# Patient Record
Sex: Male | Born: 1955
Health system: Southern US, Community
[De-identification: ages and names within clinical notes are randomized; demographics above are authoritative.]

## PROBLEM LIST (undated history)

## (undated) DIAGNOSIS — M1990 Unspecified osteoarthritis, unspecified site: Secondary | ICD-10-CM

## (undated) DIAGNOSIS — M549 Dorsalgia, unspecified: Secondary | ICD-10-CM

## (undated) DIAGNOSIS — I10 Essential (primary) hypertension: Secondary | ICD-10-CM

## (undated) DIAGNOSIS — R000 Tachycardia, unspecified: Secondary | ICD-10-CM

## (undated) DIAGNOSIS — R7989 Other specified abnormal findings of blood chemistry: Secondary | ICD-10-CM

## (undated) DIAGNOSIS — IMO0002 Reserved for concepts with insufficient information to code with codable children: Secondary | ICD-10-CM

## (undated) DIAGNOSIS — G5600 Carpal tunnel syndrome, unspecified upper limb: Secondary | ICD-10-CM

## (undated) DIAGNOSIS — I2510 Atherosclerotic heart disease of native coronary artery without angina pectoris: Secondary | ICD-10-CM

## (undated) DIAGNOSIS — J302 Other seasonal allergic rhinitis: Secondary | ICD-10-CM

## (undated) DIAGNOSIS — I219 Acute myocardial infarction, unspecified: Secondary | ICD-10-CM

## (undated) DIAGNOSIS — E11319 Type 2 diabetes mellitus with unspecified diabetic retinopathy without macular edema: Secondary | ICD-10-CM

## (undated) DIAGNOSIS — E785 Hyperlipidemia, unspecified: Secondary | ICD-10-CM

## (undated) DIAGNOSIS — E663 Overweight: Secondary | ICD-10-CM

## (undated) HISTORY — DX: Other specified abnormal findings of blood chemistry: R79.89

## (undated) HISTORY — PX: CORONARY ANGIOPLASTY: SHX604

## (undated) HISTORY — DX: Overweight: E66.3

## (undated) HISTORY — PX: CARDIAC CATHETERIZATION: SHX172

## (undated) HISTORY — DX: Type 2 diabetes mellitus with unspecified diabetic retinopathy without macular edema: E11.319

## (undated) HISTORY — DX: Hyperlipidemia, unspecified: E78.5

## (undated) HISTORY — PX: LUMBAR EPIDURAL INJECTION: SHX1980

## (undated) HISTORY — DX: Tachycardia, unspecified: R00.0

---

## 1999-06-03 ENCOUNTER — Emergency Department (HOSPITAL_COMMUNITY): Admission: EM | Admit: 1999-06-03 | Discharge: 1999-06-03 | Payer: Self-pay | Admitting: *Deleted

## 2003-09-22 ENCOUNTER — Emergency Department (HOSPITAL_COMMUNITY): Admission: EM | Admit: 2003-09-22 | Discharge: 2003-09-23 | Payer: Self-pay | Admitting: Emergency Medicine

## 2007-12-24 ENCOUNTER — Encounter: Admission: RE | Admit: 2007-12-24 | Discharge: 2007-12-24 | Payer: Self-pay | Admitting: Internal Medicine

## 2010-06-26 ENCOUNTER — Other Ambulatory Visit: Payer: Self-pay | Admitting: Neurosurgery

## 2010-06-26 DIAGNOSIS — M545 Low back pain, unspecified: Secondary | ICD-10-CM

## 2010-07-07 ENCOUNTER — Ambulatory Visit
Admission: RE | Admit: 2010-07-07 | Discharge: 2010-07-07 | Disposition: A | Discharge status: Home / Self Care | Payer: 59 | Source: Ambulatory Visit | Attending: Neurosurgery | Admitting: Neurosurgery

## 2010-07-07 DIAGNOSIS — M545 Low back pain, unspecified: Secondary | ICD-10-CM

## 2011-06-26 ENCOUNTER — Other Ambulatory Visit: Payer: Self-pay | Admitting: Orthopedic Surgery

## 2011-06-30 ENCOUNTER — Encounter (HOSPITAL_BASED_OUTPATIENT_CLINIC_OR_DEPARTMENT_OTHER): Payer: Self-pay | Admitting: *Deleted

## 2011-06-30 ENCOUNTER — Encounter (HOSPITAL_BASED_OUTPATIENT_CLINIC_OR_DEPARTMENT_OTHER)
Admission: RE | Admit: 2011-06-30 | Discharge: 2011-06-30 | Disposition: A | Discharge status: Home / Self Care | Payer: 59 | Source: Ambulatory Visit | Attending: Orthopedic Surgery | Admitting: Orthopedic Surgery

## 2011-06-30 LAB — BASIC METABOLIC PANEL
BUN: 13 mg/dL (ref 6–23)
CO2: 26 mEq/L (ref 19–32)
Calcium: 9.7 mg/dL (ref 8.4–10.5)
Chloride: 102 mEq/L (ref 96–112)
Creatinine, Ser: 1.12 mg/dL (ref 0.50–1.35)
GFR calc Af Amer: 84 mL/min — ABNORMAL LOW (ref >90)
GFR calc non Af Amer: 73 mL/min — ABNORMAL LOW (ref >90)
Glucose, Bld: 142 mg/dL — ABNORMAL HIGH (ref 70–99)
Potassium: 4 mEq/L (ref 3.5–5.1)
Sodium: 139 mEq/L (ref 135–145)

## 2011-06-30 NOTE — Progress Notes (Signed)
To bring in med list and get ekg and bmet Denies snoring or OSA Never seen a cardiologist Never had surgery

## 2011-07-01 NOTE — H&P (Signed)
Keith Newton is an 56 y.o. male.   Chief Complaint: c/o chronic and progressive right hand numbness and tingling HPI: . Keith Newton is a pleasant 56 year old gentleman who works with the [employer]. He is involved in turf care. For the past 10 years he has had episodic bilateral hand numbness. He has had extreme numbness in his hands at night 7 out of 7 nights a week during the past several months. He has weakness of grip and swelling over the volar aspect of his wrist. He has had no sign of trigger fingers. He has had type II diabetes for 4 years. He was recently seen for evaluation and was noted to have probable significant carpal tunnel syndrome.   Past Medical History  Diagnosis Date  . Diabetes mellitus   . Arthritis   . Carpal tunnel syndrome   . Hypertension     states very mild  . Seasonal allergies   . DDD (degenerative disc disease)   . Back pain     Past Surgical History  Procedure Date  . Lumbar epidural injection     x2    No family history on file. Social History:  reports that he has never smoked. He does not have any smokeless tobacco history on file. He reports that he does not drink alcohol or use illicit drugs.  Allergies:  Allergies  Allergen Reactions  . Codeine Swelling    No prescriptions prior to admission    Results for orders placed during the hospital encounter of 07/02/11 (from the past 48 hour(s))  BASIC METABOLIC PANEL     Status: Abnormal   Collection Time   06/30/11  3:00 PM      Component Value Range Comment   Sodium 139  135 - 145 (mEq/L)    Potassium 4.0  3.5 - 5.1 (mEq/L)    Chloride 102  96 - 112 (mEq/L)    CO2 26  19 - 32 (mEq/L)    Glucose, Bld 142 (*) 70 - 99 (mg/dL)    BUN 13  6 - 23 (mg/dL)    Creatinine, Ser 1.61  0.50 - 1.35 (mg/dL)    Calcium 9.7  8.4 - 10.5 (mg/dL)    GFR calc non Af Amer 73 (*) >90 (mL/min)    GFR calc Af Amer 84 (*) >90 (mL/min)     No results found.   Pertinent items are noted  in HPI.  Height 5\' 8"  (1.727 m), weight 130.182 kg (287 lb).  General appearance: alert Head: Normocephalic, without obvious abnormality Neck: supple, symmetrical, trachea midline Resp: clear to auscultation bilaterally Cardio: regular rate and rhythm GI: normal findings: bowel sounds normal Extremities: . Inspection of his hands reveals loss of his normal median sweat patterns. He has a positive wrist flexion test and a positive Tinel's sign bilaterally. His pulses and capillary refill are intact. He has no sign of stenosing tenosynovitis.    Electrodiagnostic studies: These reveal evidence of moderately severe bilateral carpal tunnel syndrome. His sensory amplitudes are markedly decreased 9 millivolts bilaterally. His ulnar conduction parameters were normal.  Pulses: 2+ and symmetric Skin: normal Neurologic: Grossly normal    Assessment/Plan  Impression: Severe Bilateral CTS  Plan: To the OR for Right CTR. The procedure, risks and post-op course were discussed with the patient at length and he was in agreement with the plan.   Carole Binning 07/01/2011, 4:36 PM      H&P documentation: 07/02/2011  -History and  Physical Reviewed  -Patient has been re-examined  -No change in the plan of care  Wyn Forster, MD

## 2011-07-02 ENCOUNTER — Encounter (HOSPITAL_BASED_OUTPATIENT_CLINIC_OR_DEPARTMENT_OTHER): Payer: Self-pay | Admitting: Anesthesiology

## 2011-07-02 ENCOUNTER — Ambulatory Visit (HOSPITAL_BASED_OUTPATIENT_CLINIC_OR_DEPARTMENT_OTHER)
Admission: RE | Admit: 2011-07-02 | Discharge: 2011-07-02 | Disposition: A | Discharge status: Home / Self Care | Payer: 59 | Source: Ambulatory Visit | Attending: Orthopedic Surgery | Admitting: Orthopedic Surgery

## 2011-07-02 ENCOUNTER — Encounter (HOSPITAL_BASED_OUTPATIENT_CLINIC_OR_DEPARTMENT_OTHER)
Admission: RE | Disposition: A | Discharge status: Home / Self Care | Payer: Self-pay | Source: Ambulatory Visit | Attending: Orthopedic Surgery

## 2011-07-02 ENCOUNTER — Encounter (HOSPITAL_BASED_OUTPATIENT_CLINIC_OR_DEPARTMENT_OTHER): Payer: Self-pay | Admitting: *Deleted

## 2011-07-02 ENCOUNTER — Ambulatory Visit (HOSPITAL_BASED_OUTPATIENT_CLINIC_OR_DEPARTMENT_OTHER): Payer: 59 | Admitting: Anesthesiology

## 2011-07-02 DIAGNOSIS — I10 Essential (primary) hypertension: Secondary | ICD-10-CM | POA: Insufficient documentation

## 2011-07-02 DIAGNOSIS — G5600 Carpal tunnel syndrome, unspecified upper limb: Secondary | ICD-10-CM | POA: Insufficient documentation

## 2011-07-02 DIAGNOSIS — Z01810 Encounter for preprocedural cardiovascular examination: Secondary | ICD-10-CM | POA: Insufficient documentation

## 2011-07-02 DIAGNOSIS — Z01812 Encounter for preprocedural laboratory examination: Secondary | ICD-10-CM | POA: Insufficient documentation

## 2011-07-02 DIAGNOSIS — E119 Type 2 diabetes mellitus without complications: Secondary | ICD-10-CM | POA: Insufficient documentation

## 2011-07-02 HISTORY — DX: Essential (primary) hypertension: I10

## 2011-07-02 HISTORY — DX: Reserved for concepts with insufficient information to code with codable children: IMO0002

## 2011-07-02 HISTORY — PX: CARPAL TUNNEL RELEASE: SHX101

## 2011-07-02 HISTORY — DX: Dorsalgia, unspecified: M54.9

## 2011-07-02 HISTORY — DX: Unspecified osteoarthritis, unspecified site: M19.90

## 2011-07-02 HISTORY — DX: Carpal tunnel syndrome, unspecified upper limb: G56.00

## 2011-07-02 HISTORY — DX: Other seasonal allergic rhinitis: J30.2

## 2011-07-02 LAB — GLUCOSE, CAPILLARY
Glucose-Capillary: 125 mg/dL — ABNORMAL HIGH (ref 70–99)
Glucose-Capillary: 135 mg/dL — ABNORMAL HIGH (ref 70–99)

## 2011-07-02 LAB — POCT HEMOGLOBIN-HEMACUE: Hemoglobin: 14.7 g/dL (ref 13.0–17.0)

## 2011-07-02 SURGERY — CARPAL TUNNEL RELEASE
Anesthesia: General | Site: Hand | Laterality: Right | Wound class: Clean

## 2011-07-02 MED ORDER — HYDROMORPHONE HCL 2 MG PO TABS
2.0000 mg | ORAL_TABLET | Freq: Once | ORAL | Status: AC | PRN
  Administered 2011-07-02: 2 mg via ORAL

## 2011-07-02 MED ORDER — METOCLOPRAMIDE HCL 5 MG/ML IJ SOLN
INTRAMUSCULAR | Status: DC | PRN
  Administered 2011-07-02: 10 mg via INTRAVENOUS

## 2011-07-02 MED ORDER — PROPOFOL 10 MG/ML IV EMUL
INTRAVENOUS | Status: DC | PRN
  Administered 2011-07-02: 300 mg via INTRAVENOUS
  Administered 2011-07-02: 100 mg via INTRAVENOUS

## 2011-07-02 MED ORDER — LIDOCAINE HCL 2 % IJ SOLN
INTRAMUSCULAR | Status: DC | PRN
  Administered 2011-07-02: 4 mL

## 2011-07-02 MED ORDER — ONDANSETRON HCL 4 MG/2ML IJ SOLN
INTRAMUSCULAR | Status: DC | PRN
  Administered 2011-07-02: 4 mg via INTRAVENOUS

## 2011-07-02 MED ORDER — LACTATED RINGERS IV SOLN
INTRAVENOUS | Status: DC
  Administered 2011-07-02: 08:00:00 via INTRAVENOUS

## 2011-07-02 MED ORDER — LIDOCAINE HCL (CARDIAC) 20 MG/ML IV SOLN
INTRAVENOUS | Status: DC | PRN
  Administered 2011-07-02: 50 mg via INTRAVENOUS

## 2011-07-02 MED ORDER — MIDAZOLAM HCL 5 MG/5ML IJ SOLN
INTRAMUSCULAR | Status: DC | PRN
  Administered 2011-07-02: 2 mg via INTRAVENOUS

## 2011-07-02 MED ORDER — METOCLOPRAMIDE HCL 5 MG/ML IJ SOLN: 10.0000 mg | Freq: Once | INTRAMUSCULAR | Status: DC | PRN

## 2011-07-02 MED ORDER — DROPERIDOL 2.5 MG/ML IJ SOLN
INTRAMUSCULAR | Status: DC | PRN
  Administered 2011-07-02: 0.625 mg via INTRAVENOUS

## 2011-07-02 MED ORDER — HYDROMORPHONE HCL 2 MG PO TABS: ORAL_TABLET | ORAL | Status: AC

## 2011-07-02 MED ORDER — FENTANYL CITRATE 0.05 MG/ML IJ SOLN
25.0000 ug | INTRAMUSCULAR | Status: DC | PRN
  Administered 2011-07-02: 25 ug via INTRAVENOUS
  Administered 2011-07-02: 50 ug via INTRAVENOUS

## 2011-07-02 MED ORDER — CHLORHEXIDINE GLUCONATE 4 % EX LIQD: 60.0000 mL | Freq: Once | CUTANEOUS | Status: DC

## 2011-07-02 MED ORDER — FENTANYL CITRATE 0.05 MG/ML IJ SOLN
INTRAMUSCULAR | Status: DC | PRN
  Administered 2011-07-02 (×2): 50 ug via INTRAVENOUS

## 2011-07-02 SURGICAL SUPPLY — 40 items
BANDAGE ADHESIVE 1X3 (GAUZE/BANDAGES/DRESSINGS) IMPLANT
BANDAGE ELASTIC 3 VELCRO ST LF (GAUZE/BANDAGES/DRESSINGS) ×2 IMPLANT
BLADE SURG 15 STRL LF DISP TIS (BLADE) ×1 IMPLANT
BLADE SURG 15 STRL SS (BLADE) ×1
BNDG ESMARK 4X9 LF (GAUZE/BANDAGES/DRESSINGS) ×2 IMPLANT
BRUSH SCRUB EZ PLAIN DRY (MISCELLANEOUS) ×2 IMPLANT
CLOTH BEACON ORANGE TIMEOUT ST (SAFETY) ×2 IMPLANT
CORDS BIPOLAR (ELECTRODE) ×2 IMPLANT
COVER MAYO STAND STRL (DRAPES) ×2 IMPLANT
COVER TABLE BACK 60X90 (DRAPES) ×2 IMPLANT
CUFF TOURNIQUET SINGLE 18IN (TOURNIQUET CUFF) IMPLANT
CUFF TOURNIQUET SINGLE 24IN (TOURNIQUET CUFF) ×2 IMPLANT
DECANTER SPIKE VIAL GLASS SM (MISCELLANEOUS) IMPLANT
DRAPE EXTREMITY T 121X128X90 (DRAPE) ×2 IMPLANT
DRAPE SURG 17X23 STRL (DRAPES) ×2 IMPLANT
GLOVE BIO SURGEON STRL SZ 6.5 (GLOVE) ×2 IMPLANT
GLOVE BIOGEL M STRL SZ7.5 (GLOVE) ×2 IMPLANT
GLOVE BIOGEL PI IND STRL 7.0 (GLOVE) ×1 IMPLANT
GLOVE BIOGEL PI INDICATOR 7.0 (GLOVE) ×1
GLOVE EXAM NITRILE MD LF STRL (GLOVE) ×2 IMPLANT
GLOVE ORTHO TXT STRL SZ7.5 (GLOVE) ×2 IMPLANT
GOWN PREVENTION PLUS XLARGE (GOWN DISPOSABLE) ×2 IMPLANT
GOWN PREVENTION PLUS XXLARGE (GOWN DISPOSABLE) ×4 IMPLANT
NEEDLE 27GAX1X1/2 (NEEDLE) ×2 IMPLANT
PACK BASIN DAY SURGERY FS (CUSTOM PROCEDURE TRAY) ×2 IMPLANT
PAD CAST 3X4 CTTN HI CHSV (CAST SUPPLIES) ×1 IMPLANT
PADDING CAST ABS 4INX4YD NS (CAST SUPPLIES) ×1
PADDING CAST ABS COTTON 4X4 ST (CAST SUPPLIES) ×1 IMPLANT
PADDING CAST COTTON 3X4 STRL (CAST SUPPLIES) ×1
SPLINT PLASTER CAST XFAST 3X15 (CAST SUPPLIES) ×5 IMPLANT
SPLINT PLASTER XTRA FASTSET 3X (CAST SUPPLIES) ×5
SPONGE GAUZE 4X4 12PLY (GAUZE/BANDAGES/DRESSINGS) ×2 IMPLANT
STOCKINETTE 4X48 STRL (DRAPES) ×2 IMPLANT
STRIP CLOSURE SKIN 1/2X4 (GAUZE/BANDAGES/DRESSINGS) ×2 IMPLANT
SUT PROLENE 3 0 PS 2 (SUTURE) ×2 IMPLANT
SYR 3ML 23GX1 SAFETY (SYRINGE) IMPLANT
SYR CONTROL 10ML LL (SYRINGE) ×2 IMPLANT
TRAY DSU PREP LF (CUSTOM PROCEDURE TRAY) ×2 IMPLANT
UNDERPAD 30X30 INCONTINENT (UNDERPADS AND DIAPERS) ×2 IMPLANT
WATER STERILE IRR 1000ML POUR (IV SOLUTION) ×2 IMPLANT

## 2011-07-02 NOTE — Brief Op Note (Signed)
07/02/2011  9:06 AM  PATIENT:  [name]  56 y.o. male  PRE-OPERATIVE DIAGNOSIS:  Bilateral carpal tunnel syndrome  POST-OPERATIVE DIAGNOSIS:  Bilateral carpal tunnel syndrome  PROCEDURE:  CARPAL TUNNEL RELEASE (Right)  SURGEON: Wyn Forster., MD   PHYSICIAN ASSISTANT:   ASSISTANTS: Mallory Shirk.A-C   ANESTHESIA:   general  EBL:  Total I/O In: 300 [I.V.:300] Out: -   BLOOD ADMINISTERED:none  DRAINS: none   LOCAL MEDICATIONS USED:  LIDOCAINE   SPECIMEN:  No Specimen  DISPOSITION OF SPECIMEN:  N/A  COUNTS:  YES  TOURNIQUET:   Total Tourniquet Time Documented: Upper Arm (Right) - 15 minutes  DICTATION: .Other Dictation: Dictation Number [id number]  PLAN OF CARE: Discharge to home after PACU  PATIENT DISPOSITION:  Awake, alert and stable

## 2011-07-02 NOTE — Op Note (Signed)
Op note dictated [id number]

## 2011-07-02 NOTE — Anesthesia Postprocedure Evaluation (Signed)
Anesthesia Post Note  Patient: Keith Newton  Procedure(s) Performed: Procedure(s) (LRB): CARPAL TUNNEL RELEASE (Right)  Anesthesia type: General  Patient location: PACU  Post pain: Pain level controlled  Post assessment: Patient's Cardiovascular Status Stable  Last Vitals:  Filed Vitals:   07/02/11 1037  BP: 123/81  Pulse: 88  Temp:   Resp: 13    Post vital signs: Reviewed and stable  Level of consciousness: alert  Complications: No apparent anesthesia complications

## 2011-07-02 NOTE — Discharge Instructions (Signed)

## 2011-07-02 NOTE — Anesthesia Procedure Notes (Addendum)
Performed by: Caren Macadam   Procedure Name: LMA Insertion Date/Time: 07/02/2011 8:38 AM Performed by: Caren Macadam Pre-anesthesia Checklist: Patient identified, Emergency Drugs available, Suction available and Patient being monitored Patient Re-evaluated:Patient Re-evaluated prior to inductionOxygen Delivery Method: Circle System Utilized Preoxygenation: Pre-oxygenation with 100% oxygen Intubation Type: IV induction Ventilation: Mask ventilation without difficulty LMA: LMA with gastric port inserted LMA Size: 4.0 Number of attempts: 1 Airway Equipment and Method: bite block Placement Confirmation: positive ETCO2 and breath sounds checked- equal and bilateral Tube secured with: Tape Dental Injury: Teeth and Oropharynx as per pre-operative assessment

## 2011-07-02 NOTE — Anesthesia Preprocedure Evaluation (Signed)
Anesthesia Evaluation  Patient identified by MRN, date of birth, ID band Patient awake    Reviewed: Allergy & Precautions, H&P , NPO status , Patient's Chart, lab work & pertinent test results, reviewed documented beta blocker date and time   Airway Mallampati: II TM Distance: >3 FB Neck ROM: full    Dental   Pulmonary neg pulmonary ROS,          Cardiovascular hypertension, On Medications     Neuro/Psych  Neuromuscular disease negative psych ROS   GI/Hepatic negative GI ROS, Neg liver ROS,   Endo/Other  Diabetes mellitus-, Type obesity  Renal/GU negative Renal ROS  negative genitourinary   Musculoskeletal   Abdominal   Peds  Hematology negative hematology ROS (+)   Anesthesia Other Findings See surgeon's H&P   Reproductive/Obstetrics negative OB ROS                           Anesthesia Physical Anesthesia Plan  ASA: II  Anesthesia Plan: General   Post-op Pain Management:    Induction: Intravenous  Airway Management Planned: LMA  Additional Equipment:   Intra-op Plan:   Post-operative Plan: Extubation in OR  Informed Consent: I have reviewed the patients History and Physical, chart, labs and discussed the procedure including the risks, benefits and alternatives for the proposed anesthesia with the patient or authorized representative who has indicated his/her understanding and acceptance.   Dental Advisory Given  Plan Discussed with: CRNA and Surgeon  Anesthesia Plan Comments:         Anesthesia Quick Evaluation

## 2011-07-02 NOTE — Transfer of Care (Signed)
Immediate Anesthesia Transfer of Care Note  Patient: Keith Newton  Procedure(s) Performed: Procedure(s) (LRB): CARPAL TUNNEL RELEASE (Right)  Patient Location: PACU  Anesthesia Type: General  Level of Consciousness: awake and alert   Airway & Oxygen Therapy: Patient Spontanous Breathing and Patient connected to face mask oxygen  Post-op Assessment: Report given to PACU RN and Post -op Vital signs reviewed and stable  Post vital signs: Reviewed and stable  Complications: No apparent anesthesia complications

## 2011-07-03 ENCOUNTER — Encounter (HOSPITAL_BASED_OUTPATIENT_CLINIC_OR_DEPARTMENT_OTHER): Payer: Self-pay | Admitting: Orthopedic Surgery

## 2011-07-03 NOTE — Op Note (Addendum)
Keith Newton, Keith Newton NO.:  [id number]  MEDICAL RECORD NO.:  [id number]  LOCATION:                                 FACILITY:  PHYSICIAN:  Katy Fitch. Marcial Pless, M.D.      DATE OF BIRTH:  DATE OF PROCEDURE:  07/02/2011 DATE OF DISCHARGE:                              OPERATIVE REPORT   PREOPERATIVE DIAGNOSIS:  Entrapment neuropathy median nerve, right carpal tunnel (severe).  POSTOPERATIVE DIAGNOSIS:  Entrapment neuropathy median nerve, right carpal tunnel (severe).  OPERATIONS:  Release of right transverse carpal ligament.  OPERATING SURGEON:  Katy Fitch. Deamber Buckhalter, M.D.  ASSISTANT:  Jonni Sanger, P.A.  ANESTHESIA:  General by LMA.  SUPERVISING ANESTHESIOLOGIST:  Janetta Hora. Gelene Mink, M.D.  INDICATIONS:  Keith Newton is a 56 year old gentleman with type 2 diabetes, referred through the courtesy of Dr. Margaretmary Bayley for management of bilateral hand numbness. Keith Newton is a very large man weighing 130 kg.  He has well controlled type 2 diabetes, under the careful supervision of Dr. Margaretmary Bayley.  He was referred due to bilateral hand numbness and chronic discomfort. Clinical examination revealed signs of probable carpal tunnel syndrome. Electrodiagnostic studies confirmed severe bilateral carpal tunnel syndrome.  After informed consent, during which we advised Keith Newton that he would require surgery to decompress the median nerves.  He also was reminded that it would take up to 5 months to see maximum benefit of surgery due to a need for remyelination and axonal regrowth.  Preoperatively questions invited and answered in detail on several occasions.  PROCEDURE:  Keith Newton was brought to room 1 of the [facility] and placed in supine position up on the operating table.  Following induction of general anesthesia by LMA technique, the right arm and hand were prepped with Betadine soap and solution, sterilely draped.  A pneumatic tourniquet  was applied over proximal right brachium.  Following exsanguination right arm with Esmarch bandage, arterial tourniquet was inflated to 220 mmHg.  Following routine surgical time-out, the procedure commenced with a 2.5 cm incision in the line of the ring finger in the palm.  Subcutaneous tissues were carefully divided taking care to identify the superficial palmar arch, and the distal margin of transcarpal ligament.  The carpal canal was sounded with [facility].  Abundant adipose tissue was noted within the mid palmar space and carpal canal obscuring vision. This was meticulously dissected and retracted followed by sequential release of the transcarpal ligament into the distal forearm.  Following release of the transverse carpal ligament, the carpal canal was widely open.  The tenosynovium of the ulnar bursa was very fibrotic. Bleeding points along the margin of the released ligament were electrocauterized with bipolar current.  Thereafter, the wound was repaired with intradermal 3-0 Prolene suture. A compressive dressing was applied with a volar plaster splint maintaining the wrist in 10 degrees of dorsiflexion.  For aftercare, Keith Newton was provided a prescription for hydromorphone 2 mg 1 p.o. q.4 hours p.r.n. pain, 20 tablets, without refill.  We will see him back for followup in our office in 1 week for dressing change and suture removal.     Keith Maduro  [name], M.D.     RVS/MEDQ  D:  07/02/2011  T:  07/02/2011  Job:  [id number]  cc:   Margaretmary Bayley, M.D.

## 2011-07-28 ENCOUNTER — Other Ambulatory Visit: Payer: Self-pay | Admitting: Orthopedic Surgery

## 2011-07-28 NOTE — Progress Notes (Signed)
Pt here 5/13 with rt ctr-did well Will need istat

## 2011-07-29 ENCOUNTER — Encounter (HOSPITAL_BASED_OUTPATIENT_CLINIC_OR_DEPARTMENT_OTHER): Payer: Self-pay | Admitting: *Deleted

## 2011-07-30 ENCOUNTER — Encounter (HOSPITAL_BASED_OUTPATIENT_CLINIC_OR_DEPARTMENT_OTHER): Payer: Self-pay | Admitting: *Deleted

## 2011-07-30 ENCOUNTER — Encounter (HOSPITAL_BASED_OUTPATIENT_CLINIC_OR_DEPARTMENT_OTHER)
Admission: RE | Disposition: A | Discharge status: Home / Self Care | Payer: Self-pay | Source: Ambulatory Visit | Attending: Orthopedic Surgery

## 2011-07-30 ENCOUNTER — Encounter (HOSPITAL_BASED_OUTPATIENT_CLINIC_OR_DEPARTMENT_OTHER): Payer: Self-pay | Admitting: Anesthesiology

## 2011-07-30 ENCOUNTER — Ambulatory Visit (HOSPITAL_BASED_OUTPATIENT_CLINIC_OR_DEPARTMENT_OTHER)
Admission: RE | Admit: 2011-07-30 | Discharge: 2011-07-30 | Disposition: A | Discharge status: Home / Self Care | Payer: 59 | Source: Ambulatory Visit | Attending: Orthopedic Surgery | Admitting: Orthopedic Surgery

## 2011-07-30 ENCOUNTER — Encounter (HOSPITAL_BASED_OUTPATIENT_CLINIC_OR_DEPARTMENT_OTHER): Payer: Self-pay | Admitting: Orthopedic Surgery

## 2011-07-30 ENCOUNTER — Ambulatory Visit (HOSPITAL_BASED_OUTPATIENT_CLINIC_OR_DEPARTMENT_OTHER): Payer: 59 | Admitting: Anesthesiology

## 2011-07-30 DIAGNOSIS — I10 Essential (primary) hypertension: Secondary | ICD-10-CM | POA: Insufficient documentation

## 2011-07-30 DIAGNOSIS — E119 Type 2 diabetes mellitus without complications: Secondary | ICD-10-CM | POA: Insufficient documentation

## 2011-07-30 DIAGNOSIS — G5600 Carpal tunnel syndrome, unspecified upper limb: Secondary | ICD-10-CM | POA: Insufficient documentation

## 2011-07-30 HISTORY — PX: CARPAL TUNNEL RELEASE: SHX101

## 2011-07-30 LAB — POCT I-STAT, CHEM 8
BUN: 13 mg/dL (ref 6–23)
Calcium, Ion: 1.21 mmol/L (ref 1.12–1.32)
Chloride: 108 mEq/L (ref 96–112)
Creatinine, Ser: 1 mg/dL (ref 0.50–1.35)
Glucose, Bld: 120 mg/dL — ABNORMAL HIGH (ref 70–99)
HCT: 50 % (ref 39.0–52.0)
Hemoglobin: 17 g/dL (ref 13.0–17.0)
Potassium: 4.2 mEq/L (ref 3.5–5.1)
Sodium: 143 mEq/L (ref 135–145)
TCO2: 25 mmol/L (ref 0–100)

## 2011-07-30 LAB — GLUCOSE, CAPILLARY
Glucose-Capillary: 132 mg/dL — ABNORMAL HIGH (ref 70–99)
Glucose-Capillary: 87 mg/dL (ref 70–99)

## 2011-07-30 LAB — POCT HEMOGLOBIN-HEMACUE: Hemoglobin: 15.4 g/dL (ref 13.0–17.0)

## 2011-07-30 SURGERY — CARPAL TUNNEL RELEASE
Anesthesia: General | Site: Wrist | Laterality: Left | Wound class: Clean

## 2011-07-30 MED ORDER — LIDOCAINE HCL 2 % IJ SOLN
INTRAMUSCULAR | Status: DC | PRN
  Administered 2011-07-30: 3 mL

## 2011-07-30 MED ORDER — OXYCODONE HCL 5 MG PO TABS: 5.0000 mg | ORAL_TABLET | Freq: Once | ORAL | Status: DC | PRN

## 2011-07-30 MED ORDER — METOCLOPRAMIDE HCL 5 MG/ML IJ SOLN
INTRAMUSCULAR | Status: DC | PRN
  Administered 2011-07-30: 10 mg via INTRAVENOUS

## 2011-07-30 MED ORDER — METOCLOPRAMIDE HCL 5 MG/ML IJ SOLN: 10.0000 mg | Freq: Once | INTRAMUSCULAR | Status: DC | PRN

## 2011-07-30 MED ORDER — ONDANSETRON HCL 4 MG/2ML IJ SOLN
INTRAMUSCULAR | Status: DC | PRN
  Administered 2011-07-30: 4 mg via INTRAVENOUS

## 2011-07-30 MED ORDER — FENTANYL CITRATE 0.05 MG/ML IJ SOLN
INTRAMUSCULAR | Status: DC | PRN
  Administered 2011-07-30 (×2): 50 ug via INTRAVENOUS

## 2011-07-30 MED ORDER — PROPOFOL 10 MG/ML IV EMUL
INTRAVENOUS | Status: DC | PRN
  Administered 2011-07-30: 300 mg via INTRAVENOUS
  Administered 2011-07-30: 40 mg via INTRAVENOUS

## 2011-07-30 MED ORDER — LIDOCAINE HCL (CARDIAC) 20 MG/ML IV SOLN
INTRAVENOUS | Status: DC | PRN
  Administered 2011-07-30: 75 mg via INTRAVENOUS

## 2011-07-30 MED ORDER — HYDROMORPHONE HCL 4 MG PO TABS: 4.0000 mg | ORAL_TABLET | ORAL | Status: AC | PRN

## 2011-07-30 MED ORDER — LACTATED RINGERS IV SOLN
INTRAVENOUS | Status: DC
  Administered 2011-07-30 (×2): via INTRAVENOUS

## 2011-07-30 MED ORDER — FENTANYL CITRATE 0.05 MG/ML IJ SOLN: 25.0000 ug | INTRAMUSCULAR | Status: DC | PRN

## 2011-07-30 MED ORDER — MIDAZOLAM HCL 5 MG/5ML IJ SOLN
INTRAMUSCULAR | Status: DC | PRN
  Administered 2011-07-30: 1 mg via INTRAVENOUS

## 2011-07-30 MED ORDER — DEXAMETHASONE SODIUM PHOSPHATE 10 MG/ML IJ SOLN
INTRAMUSCULAR | Status: DC | PRN
  Administered 2011-07-30: 4 mg via INTRAVENOUS

## 2011-07-30 MED ORDER — CHLORHEXIDINE GLUCONATE 4 % EX LIQD: 60.0000 mL | Freq: Once | CUTANEOUS | Status: DC

## 2011-07-30 SURGICAL SUPPLY — 35 items
BANDAGE ADHESIVE 1X3 (GAUZE/BANDAGES/DRESSINGS) IMPLANT
BANDAGE ELASTIC 3 VELCRO ST LF (GAUZE/BANDAGES/DRESSINGS) ×2 IMPLANT
BLADE SURG 15 STRL LF DISP TIS (BLADE) ×1 IMPLANT
BLADE SURG 15 STRL SS (BLADE) ×1
BNDG ESMARK 4X9 LF (GAUZE/BANDAGES/DRESSINGS) IMPLANT
BRUSH SCRUB EZ PLAIN DRY (MISCELLANEOUS) ×2 IMPLANT
CLOTH BEACON ORANGE TIMEOUT ST (SAFETY) ×2 IMPLANT
CORDS BIPOLAR (ELECTRODE) IMPLANT
COVER MAYO STAND STRL (DRAPES) ×2 IMPLANT
COVER TABLE BACK 60X90 (DRAPES) ×2 IMPLANT
CUFF TOURNIQUET SINGLE 18IN (TOURNIQUET CUFF) IMPLANT
DECANTER SPIKE VIAL GLASS SM (MISCELLANEOUS) IMPLANT
DRAPE EXTREMITY T 121X128X90 (DRAPE) ×2 IMPLANT
DRAPE SURG 17X23 STRL (DRAPES) ×2 IMPLANT
GLOVE BIOGEL M STRL SZ7.5 (GLOVE) ×2 IMPLANT
GLOVE ORTHO TXT STRL SZ7.5 (GLOVE) ×2 IMPLANT
GOWN PREVENTION PLUS XLARGE (GOWN DISPOSABLE) ×2 IMPLANT
GOWN PREVENTION PLUS XXLARGE (GOWN DISPOSABLE) ×4 IMPLANT
NEEDLE 27GAX1X1/2 (NEEDLE) IMPLANT
PACK BASIN DAY SURGERY FS (CUSTOM PROCEDURE TRAY) ×2 IMPLANT
PAD CAST 3X4 CTTN HI CHSV (CAST SUPPLIES) ×1 IMPLANT
PADDING CAST ABS 4INX4YD NS (CAST SUPPLIES) ×1
PADDING CAST ABS COTTON 4X4 ST (CAST SUPPLIES) ×1 IMPLANT
PADDING CAST COTTON 3X4 STRL (CAST SUPPLIES) ×1
SPLINT PLASTER CAST XFAST 3X15 (CAST SUPPLIES) ×5 IMPLANT
SPLINT PLASTER XTRA FASTSET 3X (CAST SUPPLIES) ×5
SPONGE GAUZE 4X4 12PLY (GAUZE/BANDAGES/DRESSINGS) ×2 IMPLANT
STOCKINETTE 4X48 STRL (DRAPES) ×2 IMPLANT
STRIP CLOSURE SKIN 1/2X4 (GAUZE/BANDAGES/DRESSINGS) ×2 IMPLANT
SUT PROLENE 3 0 PS 2 (SUTURE) ×2 IMPLANT
SYR 3ML 23GX1 SAFETY (SYRINGE) IMPLANT
SYR CONTROL 10ML LL (SYRINGE) IMPLANT
TRAY DSU PREP LF (CUSTOM PROCEDURE TRAY) ×2 IMPLANT
UNDERPAD 30X30 INCONTINENT (UNDERPADS AND DIAPERS) ×2 IMPLANT
WATER STERILE IRR 1000ML POUR (IV SOLUTION) ×2 IMPLANT

## 2011-07-30 NOTE — Interval H&P Note (Signed)
History and Physical Interval Note:  07/30/2011 11:58 AM  Keith Newton  has presented today for surgery, with the diagnosis of left carpal tunnel syndrome  The various methods of treatment have been discussed with the patient and family. After consideration of risks, benefits and other options for treatment, the patient has consented to  Procedure: CARPAL TUNNEL RELEASE (Left) as a surgical intervention .  The patients' history has been reviewed, patient examined, no change in status, stable for surgery.  I have reviewed the patients' chart and labs.  Questions were answered to the patient's satisfaction.     Keith Newton,Keith Newton  H&P documentation: 07/30/2011  -History and Physical Reviewed  -Patient has been re-examined  -No change in the plan of care  Wyn Forster, MD

## 2011-07-30 NOTE — Progress Notes (Signed)
Partial returneed

## 2011-07-30 NOTE — Discharge Instructions (Addendum)
  Post Anesthesia Home Care Instructions  Activity: Get plenty of rest for the remainder of the day. A responsible adult should stay with you for 24 hours following the procedure.  For the next 24 hours, DO NOT: -Drive a car -Advertising copywriter -Drink alcoholic beverages -Take any medication unless instructed by your physician -Make any legal decisions or sign important papers.  Meals: Start with liquid foods such as gelatin or soup. Progress to regular foods as tolerated. Avoid greasy, spicy, heavy foods. If nausea and/or vomiting occur, drink only clear liquids until the nausea and/or vomiting subsides. Call your physician if vomiting continues.  Special Instructions/Symptoms: Your throat may feel dry or sore from the anesthesia or the breathing tube placed in your throat during surgery. If this causes discomfort, gargle with warm salt water. The discomfort should disappear within 24 hours.        HAND SURGERY    HOME CARE INSTRUCTIONS    The following instructions have been prepared to help you care for yourself upon your return home today.  Wound Care:  Keep your hand elevated above the level of your heart. Do not allow it to dangle by your side. Keep the dressing dry and do not remove it unless your doctor advises you to do so. He will usually change it at the time of you post-op visit. Moving your fingers is advised to stimulate circulation but will depend on the site of your surgery. Of course, if you have a splint applied your doctor will advise you about movement.  Activity:  Do not drive or operate machinery today. Rest today and then you may return to your normal activity and work as indicated by your physician.  Diet: Drink liquids today or eat a light diet. You may resume a regular diet tomorrow.  General expectations: Pain for two or three days. Fingers may become slightly swollen.   Unexpected Observations- Call your doctor if any of these occur: Severe pain not  relieved by pain medication. Elevated temperature. Dressing soaked with blood. Inability to move fingers. White or bluish color to fingers.  Return to office: ***           HAND SURGERY    HOME CARE INSTRUCTIONS    The following instructions have been prepared to help you care for yourself upon your return home today.  Wound Care:  Keep your hand elevated above the level of your heart. Do not allow it to dangle by your side. Keep the dressing dry and do not remove it unless your doctor advises you to do so. He will usually change it at the time of you post-op visit. Moving your fingers is advised to stimulate circulation but will depend on the site of your surgery. Of course, if you have a splint applied your doctor will advise you about movement.  Activity:  Do not drive or operate machinery today. Rest today and then you may return to your normal activity and work as indicated by your physician.  Diet: Drink liquids today or eat a light diet. You may resume a regular diet tomorrow.  General expectations: Pain for two or three days. Fingers may become slightly swollen.   Unexpected Observations- Call your doctor if any of these occur: Severe pain not relieved by pain medication. Elevated temperature. Dressing soaked with blood. Inability to move fingers. White or bluish color to fingers.  Return to office: ***

## 2011-07-30 NOTE — Transfer of Care (Signed)
Immediate Anesthesia Transfer of Care Note  Patient: Keith Newton  Procedure(s) Performed: Procedure(s) (LRB): CARPAL TUNNEL RELEASE (Left)  Patient Location: PACU  Anesthesia Type: General  Level of Consciousness: sedated  Airway & Oxygen Therapy: Patient Spontanous Breathing and Patient connected to face mask oxygen  Post-op Assessment: Report given to PACU RN, Post -op Vital signs reviewed and stable and Patient moving all extremities  Post vital signs: Reviewed and stable  Complications: No apparent anesthesia complications

## 2011-07-30 NOTE — Brief Op Note (Signed)
07/30/2011  12:58 PM  PATIENT:  [name]  56 y.o. male  PRE-OPERATIVE DIAGNOSIS:  left carpal tunnel syndrome  POST-OPERATIVE DIAGNOSIS: left carpal tunnel syndrome  PROCEDURE:  Procedure(s) (LRB): CARPAL TUNNEL RELEASE (Left)  SURGEON:  Surgeon(s) and Role:    * Wyn Forster., MD - Primary  PHYSICIAN ASSISTANT:   ASSISTANTS: nurse  ANESTHESIA:   general  EBL:  Total I/O In: 1100 [I.V.:1100] Out: -   BLOOD ADMINISTERED:none  DRAINS: none   LOCAL MEDICATIONS USED:  LIDOCAINE   SPECIMEN:  No Specimen  DISPOSITION OF SPECIMEN:  N/A  COUNTS:  YES  TOURNIQUET:   Total Tourniquet Time Documented: Upper Arm (Left) - 10 minutes  DICTATION: .Other Dictation: Dictation Number [id number]  PLAN OF CARE: Discharge to home after PACU  PATIENT DISPOSITION:  PACU - hemodynamically stable.

## 2011-07-30 NOTE — Anesthesia Procedure Notes (Signed)
Procedure Name: LMA Insertion Date/Time: 07/30/2011 12:18 PM Performed by: Meyer Russel Pre-anesthesia Checklist: Patient identified, Emergency Drugs available, Suction available and Patient being monitored Patient Re-evaluated:Patient Re-evaluated prior to inductionOxygen Delivery Method: Circle System Utilized Preoxygenation: Pre-oxygenation with 100% oxygen Intubation Type: IV induction Ventilation: Mask ventilation without difficulty LMA: LMA inserted LMA Size: 5.0 Number of attempts: 1 Airway Equipment and Method: bite block Placement Confirmation: positive ETCO2 and breath sounds checked- equal and bilateral Tube secured with: Tape Dental Injury: Teeth and Oropharynx as per pre-operative assessment

## 2011-07-30 NOTE — H&P (View-Only) (Signed)
Keith Newton is an 56 y.o. male.   Chief Complaint: c/o chronic and progressive right hand numbness and tingling HPI: . Keith Newton is a pleasant 56-year old gentleman who works with the [employer]. He is involved in turf care. For the past 10 years he has had episodic bilateral hand numbness. He has had extreme numbness in his hands at night 7 out of 7 nights a week during the past several months. He has weakness of grip and swelling over the volar aspect of his wrist. He has had no sign of trigger fingers. He has had type II diabetes for 4 years. He was recently seen for evaluation and was noted to have probable significant carpal tunnel syndrome.   Past Medical History  Diagnosis Date  . Diabetes mellitus   . Arthritis   . Carpal tunnel syndrome   . Hypertension     states very mild  . Seasonal allergies   . DDD (degenerative disc disease)   . Back pain     Past Surgical History  Procedure Date  . Lumbar epidural injection     x2    No family history on file. Social History:  reports that he has never smoked. He does not have any smokeless tobacco history on file. He reports that he does not drink alcohol or use illicit drugs.  Allergies:  Allergies  Allergen Reactions  . Codeine Swelling    No prescriptions prior to admission    Results for orders placed during the hospital encounter of 07/02/11 (from the past 48 hour(s))  BASIC METABOLIC PANEL     Status: Abnormal   Collection Time   06/30/11  3:00 PM      Component Value Range Comment   Sodium 139  135 - 145 (mEq/L)    Potassium 4.0  3.5 - 5.1 (mEq/L)    Chloride 102  96 - 112 (mEq/L)    CO2 26  19 - 32 (mEq/L)    Glucose, Bld 142 (*) 70 - 99 (mg/dL)    BUN 13  6 - 23 (mg/dL)    Creatinine, Ser 1.12  0.50 - 1.35 (mg/dL)    Calcium 9.7  8.4 - 10.5 (mg/dL)    GFR calc non Af Amer 73 (*) >90 (mL/min)    GFR calc Af Amer 84 (*) >90 (mL/min)     No results found.   Pertinent items are noted  in HPI.  Height 5' 8" (1.727 m), weight 130.182 kg (287 lb).  General appearance: alert Head: Normocephalic, without obvious abnormality Neck: supple, symmetrical, trachea midline Resp: clear to auscultation bilaterally Cardio: regular rate and rhythm GI: normal findings: bowel sounds normal Extremities: . Inspection of his hands reveals loss of his normal median sweat patterns. He has a positive wrist flexion test and a positive Tinel's sign bilaterally. His pulses and capillary refill are intact. He has no sign of stenosing tenosynovitis.    Electrodiagnostic studies: These reveal evidence of moderately severe bilateral carpal tunnel syndrome. His sensory amplitudes are markedly decreased 9 millivolts bilaterally. His ulnar conduction parameters were normal.  Pulses: 2+ and symmetric Skin: normal Neurologic: Grossly normal    Assessment/Plan  Impression: Severe Bilateral CTS  Plan: To the OR for Right CTR. The procedure, risks and post-op course were discussed with the patient at length and he was in agreement with the plan.   Dasnoit, Hibah Odonnell  PA-C 07/01/2011, 4:36 PM      H&P documentation: 07/02/2011  -History and   Physical Reviewed  -Patient has been re-examined  -No change in the plan of care  Mozell Haber V Latarshia Jersey Jr, MD     

## 2011-07-30 NOTE — Anesthesia Preprocedure Evaluation (Addendum)
Anesthesia Evaluation  Patient identified by MRN, date of birth, ID band Patient awake    Reviewed: Allergy & Precautions, H&P , NPO status , Patient's Chart, lab work & pertinent test results, reviewed documented beta blocker date and time   Airway Mallampati: II TM Distance: >3 FB Neck ROM: full    Dental   Pulmonary neg pulmonary ROS,          Cardiovascular hypertension, On Medications     Neuro/Psych  Neuromuscular disease negative psych ROS   GI/Hepatic negative GI ROS, Neg liver ROS,   Endo/Other  negative endocrine ROSDiabetes mellitus-, Type 2, Oral Hypoglycemic AgentsMorbid obesity  Renal/GU negative Renal ROS  negative genitourinary   Musculoskeletal   Abdominal   Peds  Hematology negative hematology ROS (+)   Anesthesia Other Findings See surgeon's H&P   Reproductive/Obstetrics negative OB ROS                           Anesthesia Physical Anesthesia Plan  ASA: III  Anesthesia Plan: General   Post-op Pain Management:    Induction: Intravenous  Airway Management Planned: LMA  Additional Equipment:   Intra-op Plan:   Post-operative Plan: Extubation in OR  Informed Consent: I have reviewed the patients History and Physical, chart, labs and discussed the procedure including the risks, benefits and alternatives for the proposed anesthesia with the patient or authorized representative who has indicated his/her understanding and acceptance.   Dental Advisory Given  Plan Discussed with: CRNA and Surgeon  Anesthesia Plan Comments:         Anesthesia Quick Evaluation

## 2011-07-30 NOTE — Anesthesia Postprocedure Evaluation (Signed)
Anesthesia Post Note  Patient: Keith Newton  Procedure(s) Performed: Procedure(s) (LRB): CARPAL TUNNEL RELEASE (Left)  Anesthesia type: General  Patient location: PACU  Post pain: Pain level controlled  Post assessment: Patient's Cardiovascular Status Stable  Last Vitals:  Filed Vitals:   07/30/11 1340  BP: 130/88  Pulse: 89  Temp:   Resp: 16    Post vital signs: Reviewed and stable  Level of consciousness: alert  Complications: No apparent anesthesia complications

## 2011-07-30 NOTE — Op Note (Signed)
[id number] 

## 2011-07-31 ENCOUNTER — Encounter (HOSPITAL_BASED_OUTPATIENT_CLINIC_OR_DEPARTMENT_OTHER): Payer: Self-pay | Admitting: Orthopedic Surgery

## 2011-07-31 NOTE — Op Note (Signed)
NAMEVINCENTE, [name] NO.:  [id number]  MEDICAL RECORD NO.:  [id number]  LOCATION:                                 FACILITY:  PHYSICIAN:  Katy Fitch. Dashia Caldeira, M.D.      DATE OF BIRTH:  DATE OF PROCEDURE:  07/30/2011 DATE OF DISCHARGE:                              OPERATIVE REPORT   PREOPERATIVE DIAGNOSIS:  Moderately severe left carpal tunnel syndrome.  POSTOPERATIVE DIAGNOSIS:  Moderately severe left carpal tunnel syndrome.  OPERATION:  Release of left transcarpal ligament.  OPERATING SURGEON:  Katy Fitch. Jquan Egelston, MD  ASSISTANT:  Surgical assistant.  ANESTHESIA:  General by LMA.  SUPERVISING ANESTHESIOLOGIST:  Janetta Hora. Gelene Mink, MD  INDICATION:  Keith Newton is a 56 year old gentleman employed by the [employer].  He is a very muscular mesomorphic gentleman.  He is referred through the courtesy of Dr. Margaretmary Bayley with a history of type 2 diabetes and significant carpal tunnel syndrome.  Electrodiagnostic studies completed at our office revealed evidence of moderately severe bilateral carpal tunnel syndrome.  Due to failure to respond to nonoperative measures, he is brought to the operating room at this time anticipating release of his left transcarpal ligament.  He is 2 weeks status post release of right transcarpal ligament.  He has had significant improvement in sensation on the right side, but still has residual postop pain.  He should expect to have pain in both hands for up to 6 weeks postop and will require as long as 5 months to see maximum benefit from the decompression procedure.  After informed consent, he is brought to the operating room at this time.  Preoperatively, he was interviewed by Dr. Gelene Mink of Anesthesia.  General anesthesia by LMA technique was recommended and accepted by [name].  PROCEDURE:  [name] was brought to room 1 at [facility] and placed in a supine position on the operating  table.  Following the induction of general anesthesia by LMA technique under Dr. Thornton Dales direct supervision.  The left arm was prepped with Betadine soap solution and sterilely draped.  Upon exsanguination of left arm with Esmarch bandage, an arterial tourniquet on proximal brachium inflated to 220 mmHg.  Procedure commenced with a short incision in the line of the ring finger and the palm.  Subcutaneous tissues were carefully divided within the palmar fascia.  Bleeding points along the margin of the skin in the subdermal plexus were electrocauterized with bipolar forceps followed by splitting of the palmar fascia.  The transverse carpal ligament was identified and the common sensory branches of the median nerve were followed back to the median nerve proper.  Channel was created with a Penfield 4 elevators superficial to the nerve followed by use of scissors to release the transverse carpal ligament along its ulnar border extending into the distal forearm.  This widely opened carpal canal.  No mass or predicaments were noted.  Bleeding points along the margin of the released ligament were electrocauterized with bipolar current.  The wound was then repaired with intradermal 3-0 Prolene suture.  A compressive dressing was applied with a volar plaster splint maintaining the wrist in 15 degrees of  dorsiflexion.  For aftercare, [name] was provided prescription for Dilaudid 4 mg 1 p.o. q.4-6 h. p.r.n. pain, total of 30 tablets without refill.  He is also advised to use ibuprofen 600 mg q.6 h. p.r.n. pain along with the hydromorphone.  Questions regarding his surgery invited and answered in detail.  We will see him back for followup in our office in 7-8 days for dressing change and suture removal.     Katy Fitch. Cleaster Shiffer, M.D.     RVS/MEDQ  D:  07/30/2011  T:  07/30/2011  Job:  [id number]

## 2012-12-22 ENCOUNTER — Other Ambulatory Visit: Payer: Self-pay | Admitting: Orthopedic Surgery

## 2012-12-22 ENCOUNTER — Ambulatory Visit
Admission: RE | Admit: 2012-12-22 | Discharge: 2012-12-22 | Disposition: A | Discharge status: Home / Self Care | Payer: 59 | Source: Ambulatory Visit | Attending: Orthopedic Surgery | Admitting: Orthopedic Surgery

## 2012-12-22 DIAGNOSIS — M7541 Impingement syndrome of right shoulder: Secondary | ICD-10-CM

## 2013-01-06 ENCOUNTER — Other Ambulatory Visit: Payer: Self-pay | Admitting: Orthopedic Surgery

## 2013-01-06 DIAGNOSIS — M7541 Impingement syndrome of right shoulder: Secondary | ICD-10-CM

## 2013-01-20 ENCOUNTER — Ambulatory Visit
Admission: RE | Admit: 2013-01-20 | Discharge: 2013-01-20 | Disposition: A | Discharge status: Home / Self Care | Payer: 59 | Source: Ambulatory Visit | Attending: Orthopedic Surgery | Admitting: Orthopedic Surgery

## 2013-01-20 DIAGNOSIS — M7541 Impingement syndrome of right shoulder: Secondary | ICD-10-CM

## 2015-10-25 IMAGING — CR DG CERVICAL SPINE COMPLETE 4+V
6 series · 6 of 6 positions shown · non-contrast
Comparison: None.

CLINICAL DATA: Worsening right-sided neck pain, no trauma

EXAM:
CERVICAL SPINE  4+ VIEWS

[view not recorded (1 of 6)]
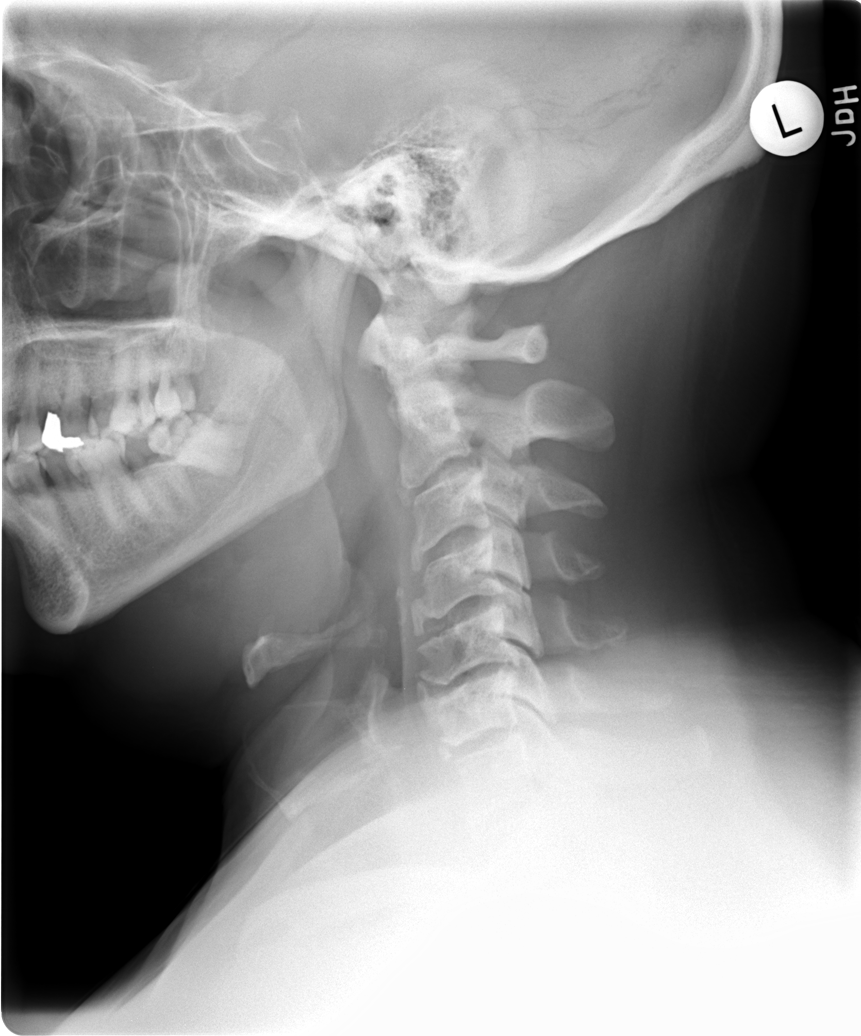

[view not recorded (2 of 6)]
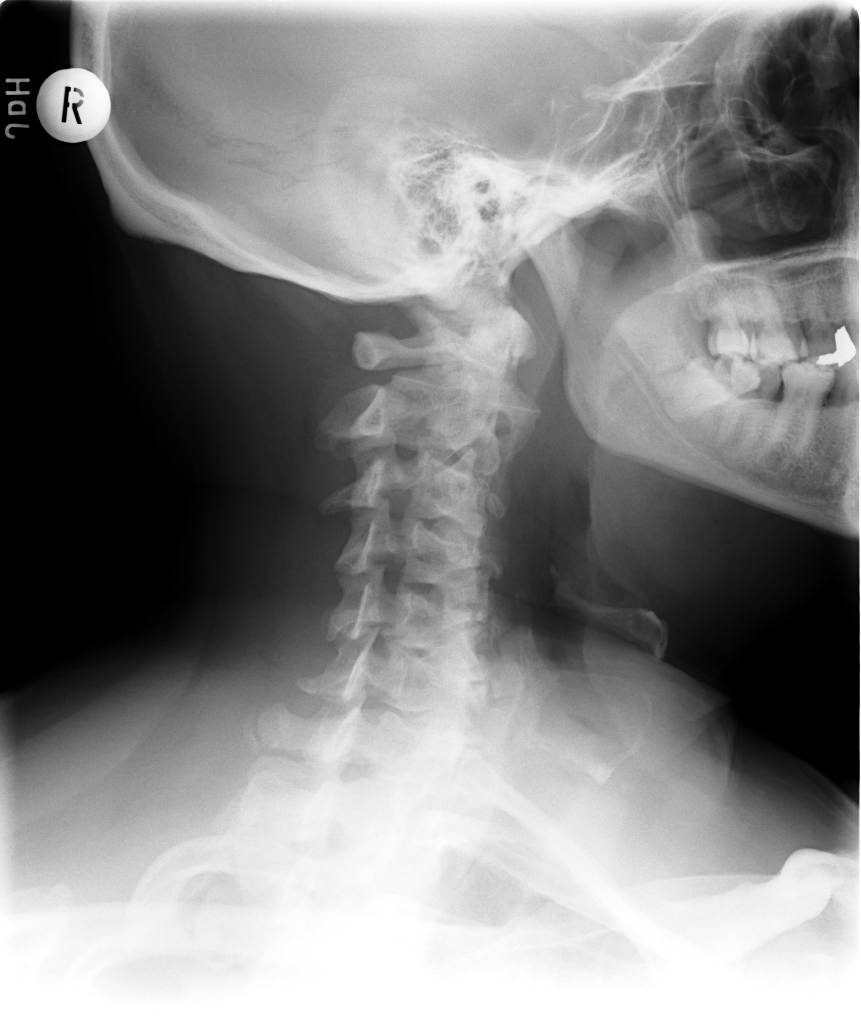

[view not recorded (3 of 6)]
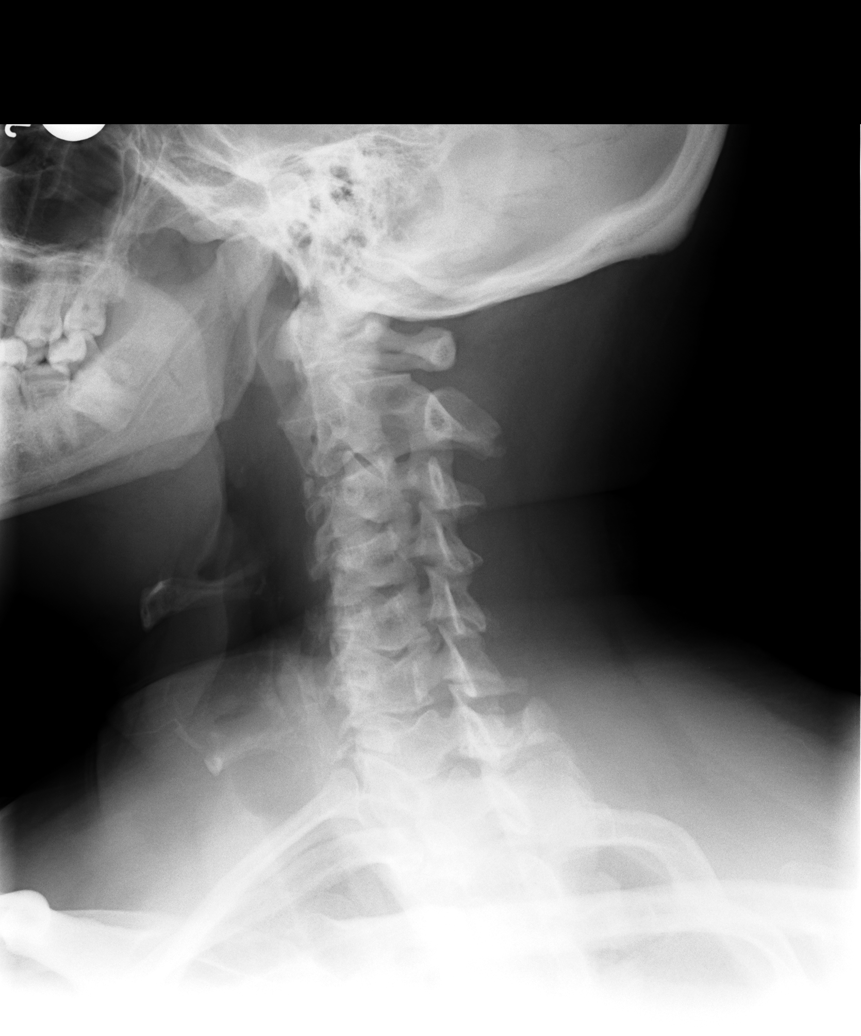

[view not recorded (4 of 6)]
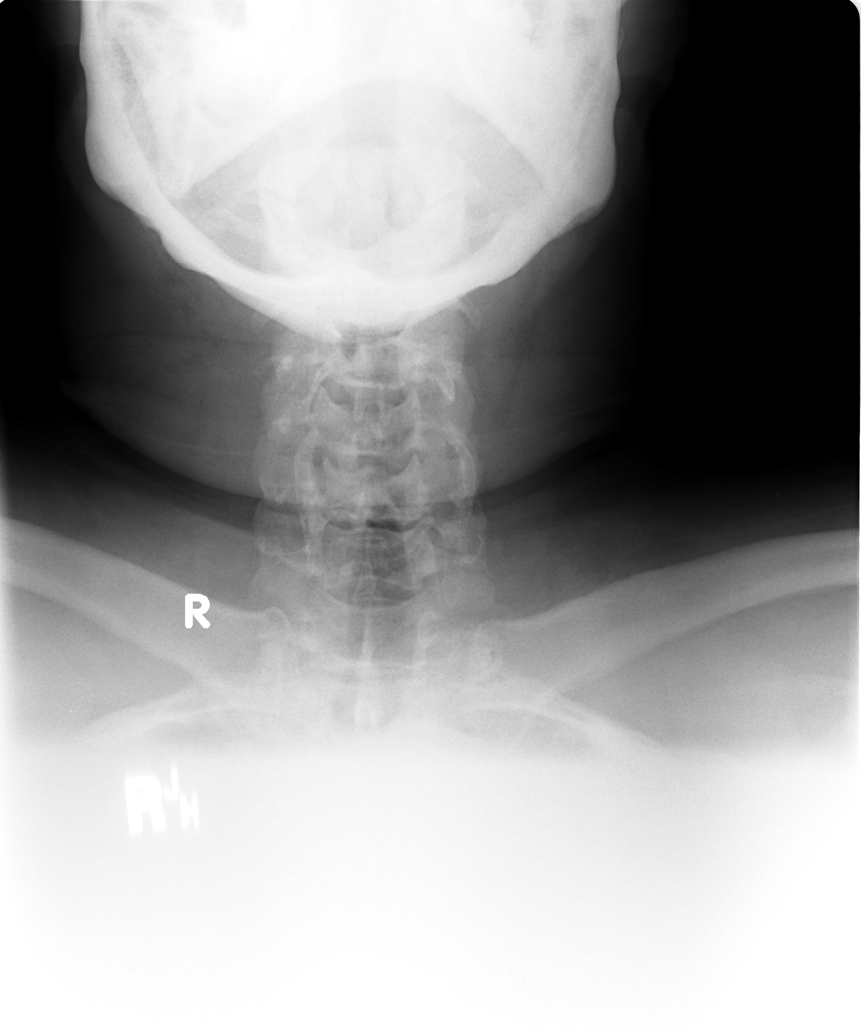

[view not recorded (5 of 6)]
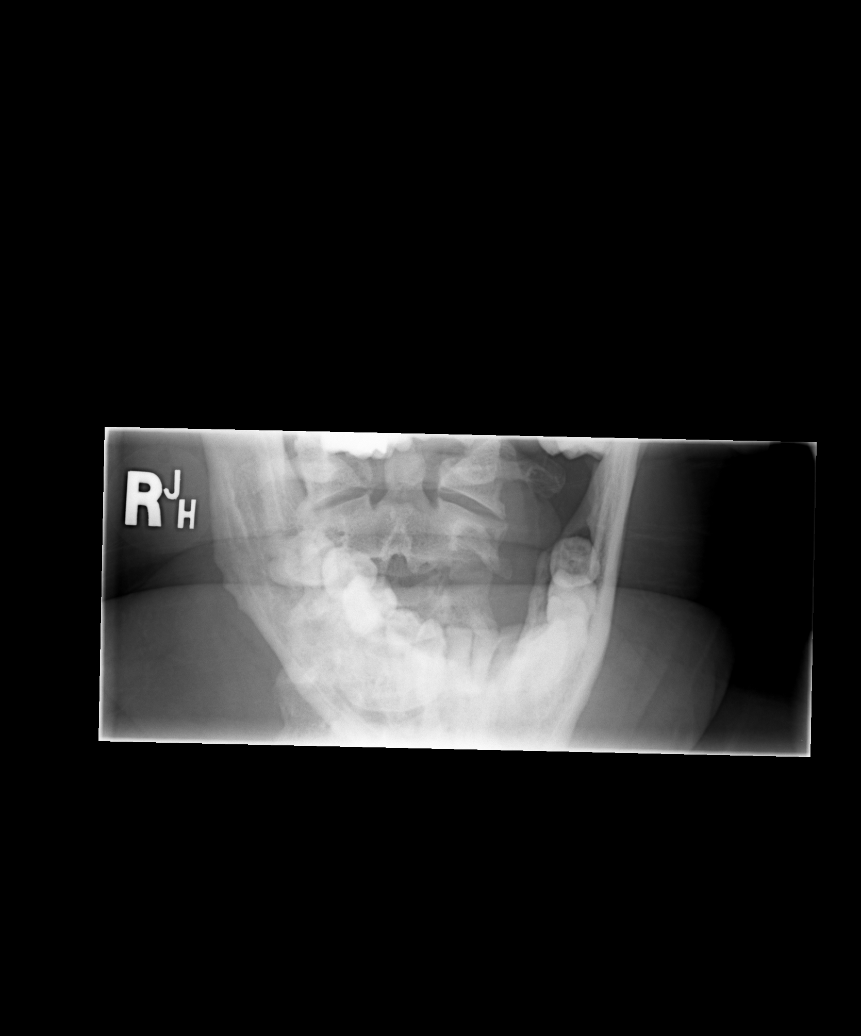

[view not recorded (6 of 6)]
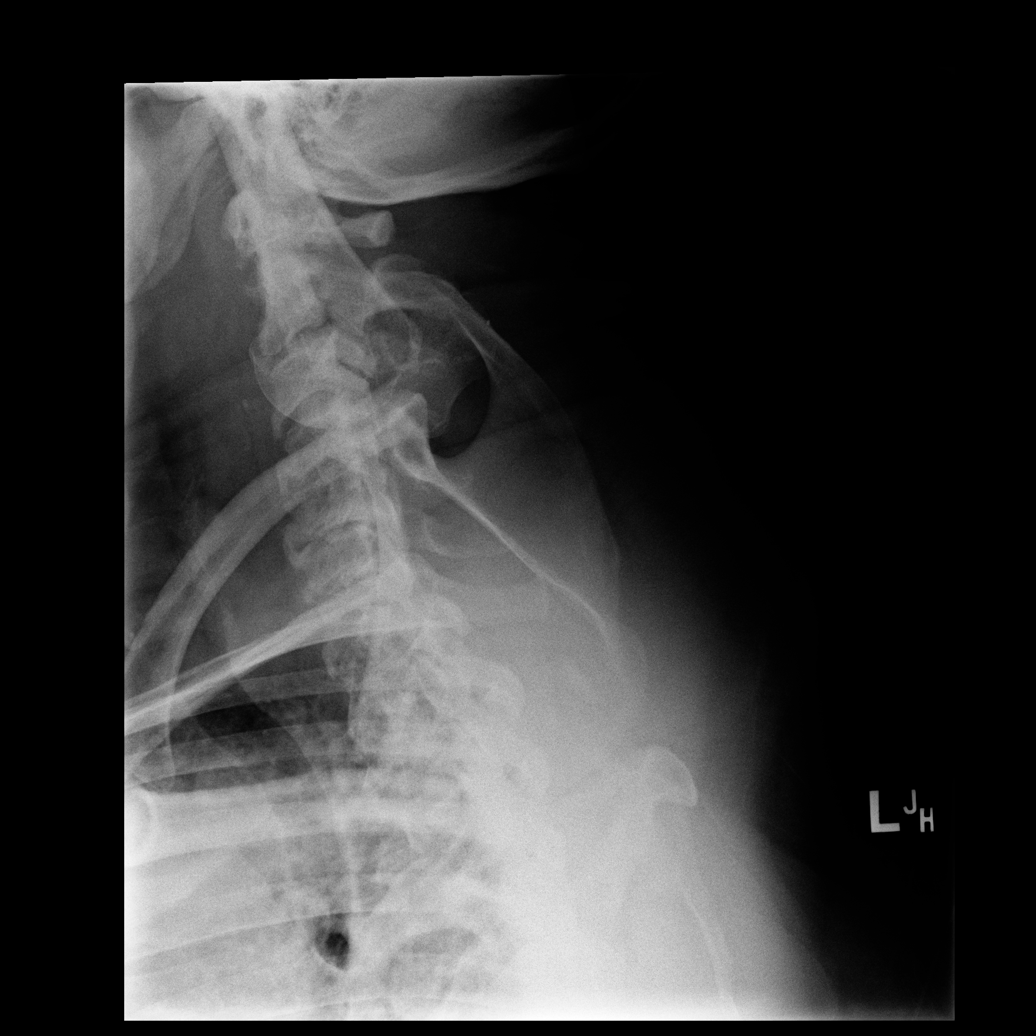

[6 of 6 positions shown; findings below may reference images not displayed]

FINDINGS: C1 to the superior endplate of C7 is visualized on the provided
lateral radiograph. Evaluation of the cervical thoracic junction,
even on the provided swimmer's radiograph, is degraded due to
overlying osseous and soft tissue structures.

There is straightening and slight reversal of the expected cervical
lordosis with mild kyphosis centered about the C5-C6 intervertebral
disc space. No definite anterolisthesis or retrolisthesis. The
bilateral facets appear normally aligned. The dens is normally
positioned between the lateral masses of C1.

Cervical vertebral body heights are preserved. Prevertebral soft
tissues are normal.

There is multilevel moderate DDD throughout the cervical spine,
likely worse at C5-C6, with disc space height loss, endplate
irregularity and small posteriorly directed disc osteophyte complex
at this location.

There is mild left-sided neural foraminal narrowing within the mid
and caudal aspect of the cervical spine, possibly accentuated due to
obliquity.

Regional soft tissues are normal.
IMPRESSION: 1. Straightening and slight reversal of the expected cervical
lordosis, nonspecific though could be seen in the setting of muscle
spasm.
2. Multilevel moderate cervical spine DDD, likely worse at C5-C6.
Further evaluation with cervical spine MRI could be performed as
clinically indicated.

## 2016-10-15 DIAGNOSIS — E119 Type 2 diabetes mellitus without complications: Secondary | ICD-10-CM | POA: Diagnosis not present

## 2016-10-15 DIAGNOSIS — E291 Testicular hypofunction: Secondary | ICD-10-CM | POA: Diagnosis not present

## 2016-10-15 DIAGNOSIS — I10 Essential (primary) hypertension: Secondary | ICD-10-CM | POA: Diagnosis not present

## 2016-12-22 DIAGNOSIS — E291 Testicular hypofunction: Secondary | ICD-10-CM | POA: Diagnosis not present

## 2016-12-22 DIAGNOSIS — I10 Essential (primary) hypertension: Secondary | ICD-10-CM | POA: Diagnosis not present

## 2016-12-22 DIAGNOSIS — E119 Type 2 diabetes mellitus without complications: Secondary | ICD-10-CM | POA: Diagnosis not present

## 2017-01-19 DIAGNOSIS — E119 Type 2 diabetes mellitus without complications: Secondary | ICD-10-CM | POA: Diagnosis not present

## 2017-01-19 DIAGNOSIS — I10 Essential (primary) hypertension: Secondary | ICD-10-CM | POA: Diagnosis not present

## 2017-01-19 DIAGNOSIS — E291 Testicular hypofunction: Secondary | ICD-10-CM | POA: Diagnosis not present

## 2017-03-29 DIAGNOSIS — E349 Endocrine disorder, unspecified: Secondary | ICD-10-CM | POA: Diagnosis not present

## 2017-03-29 DIAGNOSIS — E1165 Type 2 diabetes mellitus with hyperglycemia: Secondary | ICD-10-CM | POA: Diagnosis not present

## 2017-04-20 DIAGNOSIS — I10 Essential (primary) hypertension: Secondary | ICD-10-CM | POA: Diagnosis not present

## 2017-04-20 DIAGNOSIS — E1165 Type 2 diabetes mellitus with hyperglycemia: Secondary | ICD-10-CM | POA: Diagnosis not present

## 2017-04-20 DIAGNOSIS — R000 Tachycardia, unspecified: Secondary | ICD-10-CM | POA: Diagnosis not present

## 2017-04-20 DIAGNOSIS — M25551 Pain in right hip: Secondary | ICD-10-CM | POA: Diagnosis not present

## 2017-05-03 DIAGNOSIS — E1159 Type 2 diabetes mellitus with other circulatory complications: Secondary | ICD-10-CM | POA: Insufficient documentation

## 2017-05-03 DIAGNOSIS — I10 Essential (primary) hypertension: Secondary | ICD-10-CM | POA: Insufficient documentation

## 2017-05-03 DIAGNOSIS — R000 Tachycardia, unspecified: Secondary | ICD-10-CM | POA: Insufficient documentation

## 2017-05-03 DIAGNOSIS — I152 Hypertension secondary to endocrine disorders: Secondary | ICD-10-CM | POA: Insufficient documentation

## 2017-05-12 ENCOUNTER — Ambulatory Visit: Payer: Self-pay | Admitting: Physician Assistant

## 2017-06-15 ENCOUNTER — Encounter: Payer: Self-pay | Admitting: Physician Assistant

## 2017-06-15 ENCOUNTER — Ambulatory Visit: Payer: 59 | Admitting: Physician Assistant

## 2017-06-15 VITALS — BP 142/100 | HR 114 | Ht 68.0 in | Wt 266.0 lb

## 2017-06-15 DIAGNOSIS — I10 Essential (primary) hypertension: Secondary | ICD-10-CM

## 2017-06-15 DIAGNOSIS — R000 Tachycardia, unspecified: Secondary | ICD-10-CM

## 2017-06-15 DIAGNOSIS — E782 Mixed hyperlipidemia: Secondary | ICD-10-CM | POA: Diagnosis not present

## 2017-06-15 DIAGNOSIS — R0789 Other chest pain: Secondary | ICD-10-CM

## 2017-06-15 DIAGNOSIS — E119 Type 2 diabetes mellitus without complications: Secondary | ICD-10-CM

## 2017-06-15 MED ORDER — DILTIAZEM HCL ER COATED BEADS 120 MG PO CP24: 120.0000 mg | ORAL_CAPSULE | Freq: Every day | ORAL | 3 refills | Status: DC

## 2017-06-15 NOTE — Addendum Note (Signed)
Addended by: Vernard Gambles on: 06/15/2017 10:57 AM   Modules accepted: Orders

## 2017-06-15 NOTE — Progress Notes (Addendum)
Cardiology Office Note    Date:  06/15/2017   ID:  Keith, Newton Feb 26, 1955, MRN [id number]  PCP:  Renford Dills, MD  Cardiologist: New to Dr. Mayford Knife  Chief Complaint: tachycardia  History of Present Illness:   Keith Newton is a 62 y.o. male with history of hypertension, HLD and diabetes referred by Dr. Nehemiah Settle for evaluation of tachycardia.  Seen by PCP 04/20/2017 for blood pressure follow up. Noted tachycardic and referred here.   Here today for evaluation. He denies any palpitations. HR of 114 today. No SOB, dizziness, headache, syncope, orthopnea, PND, LE edema or melena. He complained of intermittent chest pain. Felt like "muscle cramp". Lasted for few second with self resolution. No associated symptoms. Denies alcohol abuse, tobacco smoking or illicit drug use. TSH, K and Mg were normal during PCP check 03/2017. He work at [employer]. No limiting exertional symptoms.  Strong family hx of CAD in 44s. MGF - died of MI in early 24s PGF - died of MI in 8s Father - died of MI at 72 Sister - died of stroke   Past Medical History:  Diagnosis Date  . Arthritis   . Back pain   . Carpal tunnel syndrome   . DDD (degenerative disc disease)   . Diabetes mellitus   . Hyperlipidemia   . Hypertension    states very mild  . Low testosterone   . Overweight   . Seasonal allergies   . Tachycardia     Past Surgical History:  Procedure Laterality Date  . CARPAL TUNNEL RELEASE  07/02/2011   Procedure: CARPAL TUNNEL RELEASE;  Surgeon: Wyn Forster., MD;  Location: [facility];  Service: Orthopedics;  Laterality: Right;  . CARPAL TUNNEL RELEASE  07/30/2011   Procedure: CARPAL TUNNEL RELEASE;  Surgeon: Wyn Forster., MD;  Location: [facility];  Service: Orthopedics;  Laterality: Left;  . LUMBAR EPIDURAL INJECTION     x2    Current Medications: Prior to Admission medications   Medication Sig Start Date End Date Taking?  Authorizing Provider  atorvastatin (LIPITOR) 10 MG tablet Take 10 mg by mouth daily.    [provider]  glimepiride (AMARYL) 4 MG tablet Take 4 mg by mouth 2 (two) times daily.    [provider]  ibuprofen (ADVIL,MOTRIN) 800 MG tablet Take 800 mg by mouth every 8 (eight) hours as needed.    [provider]  losartan-hydrochlorothiazide (HYZAAR) 50-12.5 MG tablet Take 1 tablet by mouth daily.    [provider]  metFORMIN (GLUCOPHAGE) 500 MG tablet Take 1,000 mg by mouth 2 (two) times daily.    [provider]  testosterone cypionate (DEPOTESTOSTERONE CYPIONATE) 200 MG/ML injection Inject 1 mL into the muscle 2 (two) times a week.    [provider]    Allergies:   Codeine   Social History   Socioeconomic History  . Marital status: Married    Spouse name: Not on file  . Number of children: Not on file  . Years of education: Not on file  . Highest education level: Not on file  Occupational History  . Not on file  Social Needs  . Financial resource strain: Not on file  . Food insecurity:    Worry: Not on file    Inability: Not on file  . Transportation needs:    Medical: Not on file    Non-medical: Not on file  Tobacco Use  . Smoking  status: Never Smoker  . Smokeless tobacco: Never Used  Substance and Sexual Activity  . Alcohol use: No  . Drug use: No  . Sexual activity: Not on file  Lifestyle  . Physical activity:    Days per week: Not on file    Minutes per session: Not on file  . Stress: Not on file  Relationships  . Social connections:    Talks on phone: Not on file    Gets together: Not on file    Attends religious service: Not on file    Active member of club or organization: Not on file    Attends meetings of clubs or organizations: Not on file    Relationship status: Not on file  Other Topics Concern  . Not on file  Social History Narrative  . Not on file     Family History:  The patient's family  history includes CAD in his father, maternal grandfather, and paternal grandfather; Stroke in his mother.   ROS:   Please see the history of present illness.    ROS All other systems reviewed and are negative.   PHYSICAL EXAM:   VS:  BP (!) 142/100   Pulse (!) 114   Ht  (1.727 m)   Wt 266 lb (120.7 kg)   BMI 40.45 kg/m    GEN: Well nourished, well developed, in no acute distress  HEENT: normal  Neck: no JVD, carotid bruits, or masses Cardiac: regular tachycardic; no murmurs, rubs, or gallops,no edema  Respiratory:  clear to auscultation bilaterally, normal work of breathing GI: soft, nontender, nondistended, + BS MS: no deformity or atrophy  Skin: warm and dry, no rash Neuro:  Alert and Oriented x 3, Strength and sensation are intact Psych: euthymic mood, full affect  Wt Readings from Last 3 Encounters:  06/15/17 266 lb (120.7 kg)  06/30/11 287 lb (130.2 kg)     Studies/Labs Reviewed:   EKG:  EKG is ordered today.  The ekg ordered today demonstrates Sinus tachycardia at rate of 114 bpm  Recent Labs: No results found for requested labs within last 8760 hours.   Lipid Panel No results found for: CHOL, TRIG, HDL, CHOLHDL, VLDL, LDLCALC, LDLDIRECT  Additional studies/ records that were reviewed today include:   As above  ASSESSMENT & PLAN:    1. Sinus tachycardia -Asymptomatic.  Unknown duration.  Denies history of palpitation or dizziness.  TSH and electrolyte were normal in February 2019.  Discussed with DOD Dr. Mayford Knife. She recommended  24-hour Holter monitor, echocardiogram and GXT >> however patient has declined all. Stating "northing wrong with my heart". This is due to "lots of stress". After long discussion, he is agree to start low-dose Cardizem CD. He will follow up if PCP or call us if interested in further evaluation. He understand risk of sudden cardiac death and untreated arrhythmias.   2.  Chest pain -Seems atypical.  His cardiac risk factor includes  family history, hypertension, hyperlipidemia and diabetes. Declined stress test.    3.  Hypertension -Elevated.  Continue losartan/hydrochlorothiazide at current dose.  Add Cardizem CD as above.  4.  Hyperlipidemia -Continue Lipitor 10 mg.  Followed by PCP.  5.  Diabetes -On metformin per PCP.  Hemoglobin A1c 9 on February 2019.    Medication Adjustments/Labs and Tests Ordered: Current medicines are reviewed at length with the patient today.  Concerns regarding medicines are outlined above.  Medication changes, Labs and Tests ordered today are listed in the Patient Instructions  below. Patient Instructions  Medication Instructions: START: Cardizem 120 mg taking 1 tablet daily  Labwork: None Ordered  Procedures/Testing: None Ordered  Follow-Up: You can follow up with our office as needed  Any Additional Special Instructions Will Be Listed Below (If Applicable).     If you need a refill on your cardiac medications before your next appointment, please call your pharmacy.       Lorelei Pont, [place]  06/15/2017 11:00 AM    [facility] [address] [place], [place], [place]  [address] Phone: [phone]; Fax: [phone]

## 2017-06-15 NOTE — Patient Instructions (Addendum)
Medication Instructions: START: Cardizem 120 mg taking 1 tablet daily  Labwork: None Ordered  Procedures/Testing: None Ordered  Follow-Up: You can follow up with our office as needed  Any Additional Special Instructions Will Be Listed Below (If Applicable).     If you need a refill on your cardiac medications before your next appointment, please call your pharmacy.

## 2017-06-16 NOTE — Addendum Note (Signed)
Addended by: Takayla Baillie S on: 06/16/2017 01:37 PM   Modules accepted: Orders  

## 2017-09-08 DIAGNOSIS — E1169 Type 2 diabetes mellitus with other specified complication: Secondary | ICD-10-CM | POA: Diagnosis not present

## 2017-09-08 DIAGNOSIS — E7800 Pure hypercholesterolemia, unspecified: Secondary | ICD-10-CM | POA: Diagnosis not present

## 2017-09-08 DIAGNOSIS — I10 Essential (primary) hypertension: Secondary | ICD-10-CM | POA: Diagnosis not present

## 2018-08-31 DIAGNOSIS — Z1331 Encounter for screening for depression: Secondary | ICD-10-CM | POA: Diagnosis not present

## 2018-08-31 DIAGNOSIS — E7800 Pure hypercholesterolemia, unspecified: Secondary | ICD-10-CM | POA: Diagnosis not present

## 2018-08-31 DIAGNOSIS — I10 Essential (primary) hypertension: Secondary | ICD-10-CM | POA: Diagnosis not present

## 2018-08-31 DIAGNOSIS — E1165 Type 2 diabetes mellitus with hyperglycemia: Secondary | ICD-10-CM | POA: Diagnosis not present

## 2018-09-13 DIAGNOSIS — E1165 Type 2 diabetes mellitus with hyperglycemia: Secondary | ICD-10-CM | POA: Diagnosis not present

## 2018-09-14 DIAGNOSIS — I10 Essential (primary) hypertension: Secondary | ICD-10-CM | POA: Diagnosis not present

## 2018-09-14 DIAGNOSIS — E1165 Type 2 diabetes mellitus with hyperglycemia: Secondary | ICD-10-CM | POA: Diagnosis not present

## 2018-09-20 DIAGNOSIS — I10 Essential (primary) hypertension: Secondary | ICD-10-CM | POA: Diagnosis not present

## 2018-09-20 DIAGNOSIS — E1165 Type 2 diabetes mellitus with hyperglycemia: Secondary | ICD-10-CM | POA: Diagnosis not present

## 2018-10-18 DIAGNOSIS — E7800 Pure hypercholesterolemia, unspecified: Secondary | ICD-10-CM | POA: Diagnosis not present

## 2018-10-18 DIAGNOSIS — E1165 Type 2 diabetes mellitus with hyperglycemia: Secondary | ICD-10-CM | POA: Diagnosis not present

## 2018-10-18 DIAGNOSIS — E1140 Type 2 diabetes mellitus with diabetic neuropathy, unspecified: Secondary | ICD-10-CM | POA: Diagnosis not present

## 2018-10-18 DIAGNOSIS — Z800 Family history of malignant neoplasm of digestive organs: Secondary | ICD-10-CM | POA: Diagnosis not present

## 2018-10-18 DIAGNOSIS — I10 Essential (primary) hypertension: Secondary | ICD-10-CM | POA: Diagnosis not present

## 2018-12-01 ENCOUNTER — Encounter: Payer: Self-pay | Admitting: Gastroenterology

## 2018-12-15 ENCOUNTER — Telehealth: Payer: Self-pay | Admitting: *Deleted

## 2018-12-15 NOTE — Telephone Encounter (Signed)
Pt.called back reschedule procedure 01/24/19 @ 0900,nurse visit 01/10/19 @ 10:30 am.

## 2018-12-15 NOTE — Telephone Encounter (Signed)
Message left with #  to call back and reschedule pre-visit and if we have not received call back, procedure will have to be cancelled.

## 2018-12-23 ENCOUNTER — Inpatient Hospital Stay (HOSPITAL_COMMUNITY): Payer: BC Managed Care – PPO

## 2018-12-23 ENCOUNTER — Encounter (HOSPITAL_COMMUNITY)
Admission: EM | Disposition: A | Discharge status: Home / Self Care | Payer: Self-pay | Source: Home / Self Care | Attending: Cardiovascular Disease

## 2018-12-23 ENCOUNTER — Other Ambulatory Visit: Payer: Self-pay

## 2018-12-23 ENCOUNTER — Other Ambulatory Visit (HOSPITAL_COMMUNITY): Payer: BC Managed Care – PPO

## 2018-12-23 ENCOUNTER — Encounter (HOSPITAL_COMMUNITY): Payer: Self-pay | Admitting: *Deleted

## 2018-12-23 ENCOUNTER — Emergency Department (HOSPITAL_COMMUNITY): Payer: BC Managed Care – PPO

## 2018-12-23 ENCOUNTER — Inpatient Hospital Stay (HOSPITAL_COMMUNITY)
Admission: EM | Admit: 2018-12-23 | Discharge: 2018-12-25 | DRG: 247 | Disposition: A | Discharge status: Home / Self Care | Payer: BC Managed Care – PPO | Attending: Cardiovascular Disease | Admitting: Cardiovascular Disease

## 2018-12-23 DIAGNOSIS — R079 Chest pain, unspecified: Secondary | ICD-10-CM

## 2018-12-23 DIAGNOSIS — R000 Tachycardia, unspecified: Secondary | ICD-10-CM

## 2018-12-23 DIAGNOSIS — I10 Essential (primary) hypertension: Secondary | ICD-10-CM

## 2018-12-23 DIAGNOSIS — Z79899 Other long term (current) drug therapy: Secondary | ICD-10-CM

## 2018-12-23 DIAGNOSIS — E118 Type 2 diabetes mellitus with unspecified complications: Secondary | ICD-10-CM | POA: Diagnosis present

## 2018-12-23 DIAGNOSIS — I214 Non-ST elevation (NSTEMI) myocardial infarction: Secondary | ICD-10-CM | POA: Diagnosis not present

## 2018-12-23 DIAGNOSIS — I160 Hypertensive urgency: Secondary | ICD-10-CM | POA: Diagnosis present

## 2018-12-23 DIAGNOSIS — I2510 Atherosclerotic heart disease of native coronary artery without angina pectoris: Secondary | ICD-10-CM

## 2018-12-23 DIAGNOSIS — Z8249 Family history of ischemic heart disease and other diseases of the circulatory system: Secondary | ICD-10-CM

## 2018-12-23 DIAGNOSIS — E1169 Type 2 diabetes mellitus with other specified complication: Secondary | ICD-10-CM

## 2018-12-23 DIAGNOSIS — Z823 Family history of stroke: Secondary | ICD-10-CM

## 2018-12-23 DIAGNOSIS — E1140 Type 2 diabetes mellitus with diabetic neuropathy, unspecified: Secondary | ICD-10-CM | POA: Diagnosis present

## 2018-12-23 DIAGNOSIS — E785 Hyperlipidemia, unspecified: Secondary | ICD-10-CM

## 2018-12-23 DIAGNOSIS — E119 Type 2 diabetes mellitus without complications: Secondary | ICD-10-CM | POA: Diagnosis present

## 2018-12-23 DIAGNOSIS — I252 Old myocardial infarction: Secondary | ICD-10-CM | POA: Diagnosis present

## 2018-12-23 DIAGNOSIS — Z6838 Body mass index (BMI) 38.0-38.9, adult: Secondary | ICD-10-CM

## 2018-12-23 DIAGNOSIS — E1159 Type 2 diabetes mellitus with other circulatory complications: Secondary | ICD-10-CM | POA: Diagnosis present

## 2018-12-23 DIAGNOSIS — I152 Hypertension secondary to endocrine disorders: Secondary | ICD-10-CM | POA: Diagnosis present

## 2018-12-23 DIAGNOSIS — Z955 Presence of coronary angioplasty implant and graft: Secondary | ICD-10-CM

## 2018-12-23 DIAGNOSIS — Z7984 Long term (current) use of oral hypoglycemic drugs: Secondary | ICD-10-CM

## 2018-12-23 DIAGNOSIS — E669 Obesity, unspecified: Secondary | ICD-10-CM | POA: Diagnosis present

## 2018-12-23 DIAGNOSIS — Z20828 Contact with and (suspected) exposure to other viral communicable diseases: Secondary | ICD-10-CM | POA: Diagnosis present

## 2018-12-23 DIAGNOSIS — Z833 Family history of diabetes mellitus: Secondary | ICD-10-CM

## 2018-12-23 HISTORY — PX: LEFT HEART CATH AND CORONARY ANGIOGRAPHY: CATH118249

## 2018-12-23 HISTORY — PX: CORONARY STENT INTERVENTION: CATH118234

## 2018-12-23 HISTORY — DX: Atherosclerotic heart disease of native coronary artery without angina pectoris: I25.10

## 2018-12-23 LAB — BASIC METABOLIC PANEL
Anion gap: 14 (ref 5–15)
BUN: 10 mg/dL (ref 8–23)
CO2: 23 mmol/L (ref 22–32)
Calcium: 10.3 mg/dL (ref 8.9–10.3)
Chloride: 101 mmol/L (ref 98–111)
Creatinine, Ser: 1.12 mg/dL (ref 0.61–1.24)
GFR calc Af Amer: >60 mL/min (ref >60)
GFR calc non Af Amer: >60 mL/min (ref >60)
Glucose, Bld: 344 mg/dL — ABNORMAL HIGH (ref 70–99)
Potassium: 4.8 mmol/L (ref 3.5–5.1)
Sodium: 138 mmol/L (ref 135–145)

## 2018-12-23 LAB — TSH: TSH: 0.418 u[IU]/mL (ref 0.350–4.500)

## 2018-12-23 LAB — HEMOGLOBIN A1C
Hgb A1c MFr Bld: 11.3 % — ABNORMAL HIGH (ref 4.8–5.6)
Mean Plasma Glucose: 277.61 mg/dL

## 2018-12-23 LAB — POCT ACTIVATED CLOTTING TIME: Activated Clotting Time: 384 seconds

## 2018-12-23 LAB — GLUCOSE, CAPILLARY
Glucose-Capillary: 247 mg/dL — ABNORMAL HIGH (ref 70–99)
Glucose-Capillary: 227 mg/dL — ABNORMAL HIGH (ref 70–99)

## 2018-12-23 LAB — HIV ANTIBODY (ROUTINE TESTING W REFLEX): HIV Screen 4th Generation wRfx: NONREACTIVE

## 2018-12-23 LAB — TROPONIN I (HIGH SENSITIVITY)
Troponin I (High Sensitivity): 4628 ng/L (ref <18)
Troponin I (High Sensitivity): 4959 ng/L (ref <18)
Troponin I (High Sensitivity): 7199 ng/L (ref <18)
Troponin I (High Sensitivity): >27000 ng/L (ref <18)

## 2018-12-23 LAB — SARS CORONAVIRUS 2 BY RT PCR (HOSPITAL ORDER, PERFORMED IN ~~LOC~~ HOSPITAL LAB): SARS Coronavirus 2: NEGATIVE

## 2018-12-23 LAB — CBC
HCT: 48.3 % (ref 39.0–52.0)
Hemoglobin: 15.7 g/dL (ref 13.0–17.0)
MCH: 27.9 pg (ref 26.0–34.0)
MCHC: 32.5 g/dL (ref 30.0–36.0)
MCV: 85.8 fL (ref 80.0–100.0)
Platelets: 285 10*3/uL (ref 150–400)
RBC: 5.63 MIL/uL (ref 4.22–5.81)
RDW: 14.1 % (ref 11.5–15.5)
WBC: 8.6 10*3/uL (ref 4.0–10.5)
nRBC: 0 % (ref 0.0–0.2)

## 2018-12-23 LAB — CBG MONITORING, ED: Glucose-Capillary: 271 mg/dL — ABNORMAL HIGH (ref 70–99)

## 2018-12-23 LAB — ECHOCARDIOGRAM COMPLETE
Height: 68 in
Weight: 4080 oz

## 2018-12-23 SURGERY — LEFT HEART CATH AND CORONARY ANGIOGRAPHY
Anesthesia: LOCAL

## 2018-12-23 MED ORDER — ASPIRIN 81 MG PO CHEW
324.0000 mg | CHEWABLE_TABLET | Freq: Once | ORAL | Status: AC
  Administered 2018-12-23: 11:00:00 324 mg via ORAL
  Filled 2018-12-23: qty 4

## 2018-12-23 MED ORDER — ONDANSETRON HCL 4 MG/2ML IJ SOLN: 4.0000 mg | Freq: Four times a day (QID) | INTRAMUSCULAR | Status: DC | PRN

## 2018-12-23 MED ORDER — LIDOCAINE HCL (PF) 1 % IJ SOLN
INTRAMUSCULAR | Status: DC | PRN
  Administered 2018-12-23: 2 mL via INTRADERMAL

## 2018-12-23 MED ORDER — HEPARIN (PORCINE) 25000 UT/250ML-% IV SOLN
1300.0000 [IU]/h | INTRAVENOUS | Status: DC
  Administered 2018-12-23: 12:00:00 1300 [IU]/h via INTRAVENOUS
  Filled 2018-12-23: qty 250

## 2018-12-23 MED ORDER — SODIUM CHLORIDE 0.9 % IV SOLN
INTRAVENOUS | Status: AC | PRN
  Administered 2018-12-23: 250 mL via INTRAVENOUS

## 2018-12-23 MED ORDER — NITROGLYCERIN 0.4 MG SL SUBL
0.4000 mg | SUBLINGUAL_TABLET | SUBLINGUAL | Status: DC | PRN
  Administered 2018-12-23: 12:00:00 0.4 mg via SUBLINGUAL
  Filled 2018-12-23: qty 1

## 2018-12-23 MED ORDER — MIDAZOLAM HCL 2 MG/2ML IJ SOLN
INTRAMUSCULAR | Status: AC
  Filled 2018-12-23: qty 2

## 2018-12-23 MED ORDER — SODIUM CHLORIDE 0.9% FLUSH: 3.0000 mL | INTRAVENOUS | Status: DC | PRN

## 2018-12-23 MED ORDER — TICAGRELOR 90 MG PO TABS
90.0000 mg | ORAL_TABLET | Freq: Two times a day (BID) | ORAL | Status: DC
  Administered 2018-12-24 – 2018-12-25 (×3): 90 mg via ORAL
  Filled 2018-12-23 (×4): qty 1

## 2018-12-23 MED ORDER — FENTANYL CITRATE (PF) 100 MCG/2ML IJ SOLN
INTRAMUSCULAR | Status: DC | PRN
  Administered 2018-12-23: 25 ug via INTRAVENOUS

## 2018-12-23 MED ORDER — NITROGLYCERIN IN D5W 200-5 MCG/ML-% IV SOLN
0.0000 ug/min | INTRAVENOUS | Status: DC
  Administered 2018-12-23: 5 ug/min via INTRAVENOUS
  Filled 2018-12-23: qty 250

## 2018-12-23 MED ORDER — MIDAZOLAM HCL 2 MG/2ML IJ SOLN
INTRAMUSCULAR | Status: DC | PRN
  Administered 2018-12-23: 2 mg via INTRAVENOUS

## 2018-12-23 MED ORDER — HEPARIN SODIUM (PORCINE) 5000 UNIT/ML IJ SOLN
4000.0000 [IU] | Freq: Once | INTRAMUSCULAR | Status: DC
  Administered 2018-12-23: 4000 [IU] via INTRAVENOUS
  Filled 2018-12-23: qty 1

## 2018-12-23 MED ORDER — ASPIRIN 81 MG PO CHEW: 81.0000 mg | CHEWABLE_TABLET | Freq: Every day | ORAL | Status: DC

## 2018-12-23 MED ORDER — SODIUM CHLORIDE 0.9 % IV SOLN: 250.0000 mL | INTRAVENOUS | Status: DC | PRN

## 2018-12-23 MED ORDER — METOPROLOL TARTRATE 25 MG PO TABS
25.0000 mg | ORAL_TABLET | Freq: Two times a day (BID) | ORAL | Status: DC
  Administered 2018-12-23 – 2018-12-25 (×4): 25 mg via ORAL
  Filled 2018-12-23 (×4): qty 1

## 2018-12-23 MED ORDER — INSULIN ASPART 100 UNIT/ML ~~LOC~~ SOLN
0.0000 [IU] | Freq: Three times a day (TID) | SUBCUTANEOUS | Status: DC
  Administered 2018-12-23: 18:00:00 5 [IU] via SUBCUTANEOUS
  Administered 2018-12-24: 3 [IU] via SUBCUTANEOUS
  Administered 2018-12-24: 2 [IU] via SUBCUTANEOUS
  Administered 2018-12-24: 3 [IU] via SUBCUTANEOUS
  Administered 2018-12-25: 2 [IU] via SUBCUTANEOUS

## 2018-12-23 MED ORDER — LOSARTAN POTASSIUM 50 MG PO TABS
50.0000 mg | ORAL_TABLET | Freq: Every day | ORAL | Status: DC
  Administered 2018-12-24 – 2018-12-25 (×2): 50 mg via ORAL
  Filled 2018-12-23 (×2): qty 1

## 2018-12-23 MED ORDER — HEPARIN (PORCINE) IN NACL 1000-0.9 UT/500ML-% IV SOLN
INTRAVENOUS | Status: AC
  Filled 2018-12-23: qty 1000

## 2018-12-23 MED ORDER — SODIUM CHLORIDE 0.9 % WEIGHT BASED INFUSION: 3.0000 mL/kg/h | INTRAVENOUS | Status: DC

## 2018-12-23 MED ORDER — LABETALOL HCL 5 MG/ML IV SOLN: 10.0000 mg | INTRAVENOUS | Status: AC | PRN

## 2018-12-23 MED ORDER — SODIUM CHLORIDE 0.9% FLUSH
3.0000 mL | Freq: Once | INTRAVENOUS | Status: AC
  Administered 2018-12-23: 11:00:00 3 mL via INTRAVENOUS

## 2018-12-23 MED ORDER — PERFLUTREN LIPID MICROSPHERE
1.0000 mL | INTRAVENOUS | Status: AC | PRN
  Administered 2018-12-23: 17:00:00 2 mL via INTRAVENOUS
  Filled 2018-12-23: qty 10

## 2018-12-23 MED ORDER — VERAPAMIL HCL 2.5 MG/ML IV SOLN
INTRAVENOUS | Status: AC
  Filled 2018-12-23: qty 2

## 2018-12-23 MED ORDER — NITROGLYCERIN 1 MG/10 ML FOR IR/CATH LAB
INTRA_ARTERIAL | Status: DC | PRN
  Administered 2018-12-23: 200 ug via INTRACORONARY
  Administered 2018-12-23: 200 ug via INTRA_ARTERIAL

## 2018-12-23 MED ORDER — LIDOCAINE HCL (PF) 1 % IJ SOLN
INTRAMUSCULAR | Status: AC
  Filled 2018-12-23: qty 30

## 2018-12-23 MED ORDER — TICAGRELOR 90 MG PO TABS
ORAL_TABLET | ORAL | Status: DC | PRN
  Administered 2018-12-23: 180 mg via ORAL

## 2018-12-23 MED ORDER — SODIUM CHLORIDE 0.9 % WEIGHT BASED INFUSION: 1.0000 mL/kg/h | INTRAVENOUS | Status: DC

## 2018-12-23 MED ORDER — HEPARIN SODIUM (PORCINE) 1000 UNIT/ML IJ SOLN
INTRAMUSCULAR | Status: AC
  Filled 2018-12-23: qty 1

## 2018-12-23 MED ORDER — TIROFIBAN HCL IN NACL 5-0.9 MG/100ML-% IV SOLN
INTRAVENOUS | Status: AC
  Filled 2018-12-23: qty 100

## 2018-12-23 MED ORDER — FENTANYL CITRATE (PF) 100 MCG/2ML IJ SOLN
INTRAMUSCULAR | Status: AC
  Filled 2018-12-23: qty 2

## 2018-12-23 MED ORDER — SODIUM CHLORIDE 0.9% FLUSH
3.0000 mL | Freq: Two times a day (BID) | INTRAVENOUS | Status: DC
  Administered 2018-12-23 – 2018-12-25 (×3): 3 mL via INTRAVENOUS

## 2018-12-23 MED ORDER — SODIUM CHLORIDE 0.9% FLUSH
3.0000 mL | Freq: Two times a day (BID) | INTRAVENOUS | Status: DC
  Administered 2018-12-25: 3 mL via INTRAVENOUS

## 2018-12-23 MED ORDER — TIROFIBAN (AGGRASTAT) BOLUS VIA INFUSION
INTRAVENOUS | Status: DC | PRN
  Administered 2018-12-23: 2892.5 ug via INTRAVENOUS

## 2018-12-23 MED ORDER — ACETAMINOPHEN 325 MG PO TABS: 650.0000 mg | ORAL_TABLET | ORAL | Status: DC | PRN

## 2018-12-23 MED ORDER — SODIUM CHLORIDE 0.9 % IV BOLUS
1000.0000 mL | Freq: Once | INTRAVENOUS | Status: AC
  Administered 2018-12-23: 1000 mL via INTRAVENOUS

## 2018-12-23 MED ORDER — HEPARIN SODIUM (PORCINE) 1000 UNIT/ML IJ SOLN
INTRAMUSCULAR | Status: DC | PRN
  Administered 2018-12-23: 8000 [IU] via INTRAVENOUS
  Administered 2018-12-23: 6000 [IU] via INTRAVENOUS

## 2018-12-23 MED ORDER — HEPARIN (PORCINE) IN NACL 1000-0.9 UT/500ML-% IV SOLN
INTRAVENOUS | Status: DC | PRN
  Administered 2018-12-23 (×3): 500 mL

## 2018-12-23 MED ORDER — NITROGLYCERIN 0.4 MG SL SUBL: 0.4000 mg | SUBLINGUAL_TABLET | SUBLINGUAL | Status: DC | PRN

## 2018-12-23 MED ORDER — SODIUM CHLORIDE 0.9 % IV SOLN
INTRAVENOUS | Status: DC
  Administered 2018-12-23: 12:00:00 via INTRAVENOUS

## 2018-12-23 MED ORDER — SODIUM CHLORIDE 0.9 % IV SOLN: INTRAVENOUS | Status: AC

## 2018-12-23 MED ORDER — ONDANSETRON HCL 4 MG/2ML IJ SOLN
4.0000 mg | Freq: Once | INTRAMUSCULAR | Status: AC
  Administered 2018-12-23: 11:00:00 4 mg via INTRAVENOUS
  Filled 2018-12-23: qty 2

## 2018-12-23 MED ORDER — FENTANYL CITRATE (PF) 100 MCG/2ML IJ SOLN
50.0000 ug | Freq: Once | INTRAMUSCULAR | Status: DC
  Filled 2018-12-23: qty 2

## 2018-12-23 MED ORDER — ASPIRIN 81 MG PO CHEW: 81.0000 mg | CHEWABLE_TABLET | ORAL | Status: DC

## 2018-12-23 MED ORDER — ASPIRIN EC 81 MG PO TBEC
81.0000 mg | DELAYED_RELEASE_TABLET | Freq: Every day | ORAL | Status: DC
  Administered 2018-12-24 – 2018-12-25 (×2): 81 mg via ORAL
  Filled 2018-12-23 (×3): qty 1

## 2018-12-23 MED ORDER — ACETAMINOPHEN 325 MG PO TABS
650.0000 mg | ORAL_TABLET | ORAL | Status: DC | PRN
  Administered 2018-12-24: 09:00:00 650 mg via ORAL
  Filled 2018-12-23: qty 2

## 2018-12-23 MED ORDER — NITROGLYCERIN 1 MG/10 ML FOR IR/CATH LAB
INTRA_ARTERIAL | Status: AC
  Filled 2018-12-23: qty 10

## 2018-12-23 MED ORDER — HYDRALAZINE HCL 20 MG/ML IJ SOLN: 10.0000 mg | INTRAMUSCULAR | Status: AC | PRN

## 2018-12-23 MED ORDER — ATORVASTATIN CALCIUM 80 MG PO TABS
80.0000 mg | ORAL_TABLET | Freq: Every day | ORAL | Status: DC
  Administered 2018-12-23 – 2018-12-24 (×2): 80 mg via ORAL
  Filled 2018-12-23 (×2): qty 1

## 2018-12-23 MED ORDER — VERAPAMIL HCL 2.5 MG/ML IV SOLN
INTRAVENOUS | Status: DC | PRN
  Administered 2018-12-23 (×2): 200 ug via INTRACORONARY

## 2018-12-23 MED ORDER — HEPARIN (PORCINE) 25000 UT/250ML-% IV SOLN: 12.0000 [IU]/kg/h | INTRAVENOUS | Status: DC

## 2018-12-23 MED ORDER — METOPROLOL TARTRATE 5 MG/5ML IV SOLN
5.0000 mg | Freq: Once | INTRAVENOUS | Status: AC
  Administered 2018-12-23: 12:00:00 5 mg via INTRAVENOUS
  Filled 2018-12-23: qty 5

## 2018-12-23 MED ORDER — VERAPAMIL HCL 2.5 MG/ML IV SOLN
INTRAVENOUS | Status: DC | PRN
  Administered 2018-12-23: 10 mL via INTRA_ARTERIAL

## 2018-12-23 SURGICAL SUPPLY — 22 items
BALLN SAPPHIRE 2.5X15 (BALLOONS) ×2
BALLN SAPPHIRE 3.0X20 (BALLOONS) ×2
BALLN SAPPHIRE ~~LOC~~ 3.25X15 (BALLOONS) ×2 IMPLANT
BALLOON SAPPHIRE 2.5X15 (BALLOONS) ×1 IMPLANT
BALLOON SAPPHIRE 3.0X20 (BALLOONS) ×1 IMPLANT
CATH 5FR JL3.5 JR4 ANG PIG MP (CATHETERS) ×2 IMPLANT
CATH LAUNCHER 6FR JR4 (CATHETERS) ×2 IMPLANT
CATH VISTA GUIDE 6FR XBRCA (CATHETERS) ×2 IMPLANT
DEVICE RAD COMP TR BAND LRG (VASCULAR PRODUCTS) ×2 IMPLANT
GLIDESHEATH SLEND SS 6F .021 (SHEATH) ×2 IMPLANT
GUIDEWIRE INQWIRE 1.5J.035X260 (WIRE) ×1 IMPLANT
INQWIRE 1.5J .035X260CM (WIRE) ×2
KIT ENCORE 26 ADVANTAGE (KITS) ×2 IMPLANT
KIT HEART LEFT (KITS) ×2 IMPLANT
KIT HEMO VALVE WATCHDOG (MISCELLANEOUS) ×2 IMPLANT
PACK CARDIAC CATHETERIZATION (CUSTOM PROCEDURE TRAY) ×2 IMPLANT
STENT SYNERGY DES 2.75X38 (Permanent Stent) ×2 IMPLANT
STENT SYNERGY DES 3X16 (Permanent Stent) ×2 IMPLANT
SYR MEDRAD MARK 7 150ML (SYRINGE) ×2 IMPLANT
TRANSDUCER W/STOPCOCK (MISCELLANEOUS) ×2 IMPLANT
TUBING CIL FLEX 10 FLL-RA (TUBING) ×2 IMPLANT
WIRE ASAHI PROWATER 180CM (WIRE) ×2 IMPLANT

## 2018-12-23 NOTE — Interval H&P Note (Signed)
Cath Lab Visit (complete for each Cath Lab visit)  Clinical Evaluation Leading to the Procedure:   ACS: Yes.    Non-ACS:    Anginal Classification: CCS IV  Anti-ischemic medical therapy: Minimal Therapy (1 class of medications)  Non-Invasive Test Results: No non-invasive testing performed  Prior CABG: No previous CABG      History and Physical Interval Note:  12/23/2018 1:34 PM  Keith Newton  has presented today for surgery, with the diagnosis of NSTEMI.  The various methods of treatment have been discussed with the patient and family. After consideration of risks, benefits and other options for treatment, the patient has consented to  Procedure(s): LEFT HEART CATH AND CORONARY ANGIOGRAPHY (N/A) as a surgical intervention.  The patient's history has been reviewed, patient examined, no change in status, stable for surgery.  I have reviewed the patient's chart and labs.  Questions were answered to the patient's satisfaction.     Larae Grooms

## 2018-12-23 NOTE — ED Notes (Signed)
Albrizze PA at bedside, instructed this RN to HOLD the heparin inj. because the pt needs the heparin drip bolus instead.

## 2018-12-23 NOTE — ED Notes (Signed)
EDP lockwood at bedside.

## 2018-12-23 NOTE — Progress Notes (Signed)
ANTICOAGULATION CONSULT NOTE - Initial Consult  Pharmacy Consult:  Heparin Indication: chest pain/ACS  Allergies  Allergen Reactions  . Codeine Swelling    Patient Measurements: Height: 5\' 8"  (172.7 cm) Weight: 255 lb (115.7 kg) IBW/kg (Calculated) : 68.4 Heparin Dosing Weight: 95 kg  Vital Signs: Temp: 97.7 F (36.5 C) (11/06 0907) Temp Source: Oral (11/06 0907) BP: 159/100 (11/06 1130) Pulse Rate: 117 (11/06 1115)  Labs: Recent Labs    12/23/18 0938  HGB 15.7  HCT 48.3  PLT 285  CREATININE 1.12  TROPONINIHS 4,628*    Estimated Creatinine Clearance: 84.4 mL/min (by C-G formula based on SCr of 1.12 mg/dL).   Medical History: Past Medical History:  Diagnosis Date  . Arthritis   . Back pain   . Carpal tunnel syndrome   . DDD (degenerative disc disease)   . Diabetes mellitus   . Hyperlipidemia   . Hypertension    states very mild  . Low testosterone   . Overweight   . Seasonal allergies   . Tachycardia     Assessment: 58 YOM presented with chest pain and Pharmacy consulted to dose IV heparin.  Baseline labs and home meds reviewed.  Goal of Therapy:  Heparin level 0.3-0.7 units/ml Monitor platelets by anticoagulation protocol: Yes   Plan:  Heparin 4000 units IV bolus, then Heparin gtt at 1300 units/hr Check 6 hr heparin level Daily heparin level and CBC  Dannis Deroche D. Mina Marble, PharmD, BCPS, [place] 12/23/2018, 1:00 PM

## 2018-12-23 NOTE — ED Provider Notes (Signed)
[facility] EMERGENCY DEPARTMENT Provider Note   CSN: [id number] Arrival date & time: 12/23/18  0626     History   Chief Complaint Chief Complaint  Patient presents with  . Chest Pain    HPI Keith Newton is a 63 y.o. male past medical history significant for degenerative disc disease, diabetes on Metformin, hypertension, hyperlipidemia presents to emergency department today with complaint of chest pain.  Patient states pain started at 1030 last night, approximately 25 hours ago.  Patient states he ate a tuna sandwich for dinner and went to bed.  He was woken up with pain in the center of his chest.  It radiates to both shoulders and his back.  He describes the pain as dull nagging sensation.  He rates the pain 7 out of 10 in severity.  The pain has been constant. He reports associated  shortness of breath, nausea with nonbloody nonbilious emesis.  He states with the pain he became very diaphoretic.  He did not take any medications for his symptoms prior to arrival. Last PO intake was 7am this morning. He denies fever, chills, cough, abdominal pain, urinary symptoms, diarrhea.  No known exposure to anyone positive for COVID-19.    He denies history of MI, CVA. Pt is not anticoagulated. Pt has a family history of CAD.   Past Medical History:  Diagnosis Date  . Arthritis   . Back pain   . Carpal tunnel syndrome   . DDD (degenerative disc disease)   . Diabetes mellitus   . Hyperlipidemia   . Hypertension    states very mild  . Low testosterone   . Overweight   . Seasonal allergies   . Tachycardia     Patient Active Problem List   Diagnosis Date Noted  . Tachycardia   . Hypertension     Past Surgical History:  Procedure Laterality Date  . CARPAL TUNNEL RELEASE  07/02/2011   Procedure: CARPAL TUNNEL RELEASE;  Surgeon: Wyn Forster., MD;  Location: [facility];  Service: Orthopedics;  Laterality: Right;  . CARPAL TUNNEL RELEASE  07/30/2011    Procedure: CARPAL TUNNEL RELEASE;  Surgeon: Wyn Forster., MD;  Location: [facility];  Service: Orthopedics;  Laterality: Left;  . LUMBAR EPIDURAL INJECTION     x2        Home Medications    Prior to Admission medications   Medication Sig Start Date End Date Taking? Authorizing Provider  atorvastatin (LIPITOR) 10 MG tablet Take 10 mg by mouth daily.    [provider]  diltiazem (CARDIZEM CD) 120 MG 24 hr capsule Take 1 capsule (120 mg total) by mouth daily. 06/15/17 09/13/17  Bhagat, Sharrell Ku, PA  glimepiride (AMARYL) 4 MG tablet Take 4 mg by mouth 2 (two) times daily.    [provider]  ibuprofen (ADVIL,MOTRIN) 800 MG tablet Take 800 mg by mouth every 8 (eight) hours as needed.    [provider]  losartan-hydrochlorothiazide (HYZAAR) 50-12.5 MG tablet Take 1 tablet by mouth daily.    [provider]  metFORMIN (GLUCOPHAGE) 500 MG tablet Take 1,000 mg by mouth 2 (two) times daily.    [provider]  testosterone cypionate (DEPOTESTOSTERONE CYPIONATE) 200 MG/ML injection Inject 1 mL into the muscle 2 (two) times a week.    [provider]    Family History Family History  Problem Relation Age of Onset  . Stroke Mother   . CAD Father   .  CAD Maternal Grandfather   . CAD Paternal Grandfather     Social History Social History   Tobacco Use  . Smoking status: Never Smoker  . Smokeless tobacco: Never Used  Substance Use Topics  . Alcohol use: No  . Drug use: No     Allergies   Codeine   Review of Systems Review of Systems  Constitutional: Negative for chills and fever.  HENT: Negative for congestion, rhinorrhea, sinus pressure and sore throat.   Eyes: Negative for pain and redness.  Respiratory: Negative for cough, shortness of breath and wheezing.   Cardiovascular: Positive for chest pain. Negative for palpitations.  Gastrointestinal: Positive for nausea and vomiting. Negative for  abdominal pain, constipation and diarrhea.  Genitourinary: Negative for dysuria.  Musculoskeletal: Negative for arthralgias, back pain, myalgias and neck pain.  Skin: Negative for rash and wound.  Neurological: Negative for dizziness, syncope, weakness, numbness and headaches.  Psychiatric/Behavioral: Negative for confusion.     Physical Exam Updated Vital Signs BP (!) 153/119   Pulse (!) 117   Temp 97.7 F (36.5 C) (Oral)   Resp 18   Ht 5\' 8"  (1.727 m)   Wt 115.7 kg   SpO2 99%   BMI 38.77 kg/m   Physical Exam Vitals signs and nursing note reviewed.  Constitutional:      General: He is not in acute distress.    Appearance: He is ill-appearing and diaphoretic.  HENT:     Head: Normocephalic and atraumatic.     Right Ear: Tympanic membrane and external ear normal.     Left Ear: Tympanic membrane and external ear normal.     Nose: Nose normal.     Mouth/Throat:     Mouth: Mucous membranes are moist.     Pharynx: Oropharynx is clear.  Eyes:     General: No scleral icterus.       Right eye: No discharge.        Left eye: No discharge.     Extraocular Movements: Extraocular movements intact.     Conjunctiva/sclera: Conjunctivae normal.     Pupils: Pupils are equal, round, and reactive to light.  Neck:     Musculoskeletal: Normal range of motion.     Vascular: No JVD.  Cardiovascular:     Rate and Rhythm: Regular rhythm.     Pulses: Normal pulses.          Radial pulses are 2+ on the right side and 2+ on the left side.     Heart sounds: Normal heart sounds.     Comments: Tachycardic to 117 Pulmonary:     Comments: Lungs clear to auscultation in all fields. Symmetric chest rise. No wheezing, rales, or rhonchi. Abdominal:     Comments: Abdomen is soft, non-distended, and non-tender in all quadrants. No rigidity, no guarding. No peritoneal signs.  Musculoskeletal: Normal range of motion.     Right lower leg: No edema.     Left lower leg: No edema.  Skin:    General:  Skin is warm.     Capillary Refill: Capillary refill takes less than 2 seconds.  Neurological:     Mental Status: He is oriented to person, place, and time.     GCS: GCS eye subscore is 4. GCS verbal subscore is 5. GCS motor subscore is 6.     Comments: Fluent speech, no facial droop.  Psychiatric:        Behavior: Behavior normal.      ED Treatments /  Results  Labs (all labs ordered are listed, but only abnormal results are displayed) Labs Reviewed  BASIC METABOLIC PANEL - Abnormal; Notable for the following components:      Result Value   Glucose, Bld 344 (*)    All other components within normal limits  TROPONIN I (HIGH SENSITIVITY) - Abnormal; Notable for the following components:   Troponin I (High Sensitivity) 4,628 (*)    All other components within normal limits  CBC  TROPONIN I (HIGH SENSITIVITY)    EKG EKG Interpretation  Date/Time:  Friday December 23 2018 09:10:34 EST Ventricular Rate:  112 PR Interval:  162 QRS Duration: 86 QT Interval:  332 QTC Calculation: 453 R Axis:   68 Text Interpretation: Sinus tachycardia Low voltage QRS T wave inversion No significant change since last tracing Abnormal ECG Confirmed by Gerhard Munch [phone]) on 12/23/2018 11:04:24 AM   Radiology Dg Chest 2 View  Result Date: 12/23/2018 CLINICAL DATA:  Chest pain, diaphoresis and vomiting since last night. EXAM: CHEST - 2 VIEW COMPARISON:  None. FINDINGS: Normal sized heart. Clear lungs. Mild thoracic spine degenerative changes. IMPRESSION: No acute abnormality. Electronically Signed   By: Beckie Salts M.D.   On: 12/23/2018 09:53    Procedures .Critical Care Performed by: Sherene Sires, PA-C Authorized by: Sherene Sires, PA-C   Critical care provider statement:    Critical care time (minutes):  35   Critical care time was exclusive of:  Teaching time   Critical care was necessary to treat or prevent imminent or life-threatening deterioration of the following  conditions:  Cardiac failure   Critical care was time spent personally by me on the following activities:  Development of treatment plan with patient or surrogate, discussions with consultants, evaluation of patient's response to treatment, examination of patient, ordering and performing treatments and interventions, ordering and review of laboratory studies, ordering and review of radiographic studies, pulse oximetry, re-evaluation of patient's condition, review of old charts and obtaining history from patient or surrogate   I assumed direction of critical care for this patient from another provider in my specialty: no     (including critical care time)  Medications Ordered in ED Medications  sodium chloride flush (NS) 0.9 % injection 3 mL (has no administration in time range)  aspirin chewable tablet 324 mg (has no administration in time range)  ondansetron (ZOFRAN) injection 4 mg (has no administration in time range)  sodium chloride 0.9 % bolus 1,000 mL (has no administration in time range)     Initial Impression / Assessment and Plan / ED Course  I have reviewed the triage vital signs and the nursing notes.  Pertinent labs & imaging results that were available during my care of the patient were reviewed by me and considered in my medical decision making (see chart for details).   Patient seen and examined on arrival to ED room.  Patient had labs drawn in triage, they were remarkable for elevated troponin of 4623.  BMP showed hyperglycemia of 344, normal anion gap, normal renal function, no severe electrolyte derrangement. CBC without leukocytosis, hemoglobin is 15.7/ On my exam patient is diaphoretic and ill-appearing.  He is actively vomiting during exam.  He is tachycardic to 117, afebrile, normotensive, no hypoxia.  EKG viewed by myself and ED attending Dr. Jeraldine Loots with new T wave inversion inferior laterally.  Chest x-ray also viewed by me without signs of infiltrate, pneumothorax   Case discussed with cardiology who will be admit patient for  NSTEMI.  Second troponin is pending.  Covid test is pending.  Heparin per pharmacy consult, patient given sublingual nitro for pain.  Fentanyl ordered if pain persists after nitro as patient has codeine allergy reaction of swelling so did not order morphine.  Patient admitted by cardiology attending Dr. Tresa EndoKelly.  This is been a shared ED visit with attending Dr. Jeraldine LootsLockwood.  He personally saw and evaluated the patient, he agrees with plan of care.    Portions of this note were generated with Scientist, clinical (histocompatibility and immunogenetics)Dragon dictation software. Dictation errors may occur despite best attempts at proofreading.     Final Clinical Impressions(s) / ED Diagnoses   Final diagnoses:  NSTEMI (non-ST elevated myocardial infarction) [facility](HCC)    ED Discharge Orders    None       Kathyrn Lasslbrizze, Kaysi Ourada E, PA-C 12/23/18 1309    Gerhard MunchLockwood, Robert, MD 12/23/18 1656

## 2018-12-23 NOTE — CV Procedure (Signed)
2D echo attempted, but patient already in procedure in the cath lab

## 2018-12-23 NOTE — ED Notes (Signed)
Pads on pt & Zole on the bed, ready for transport ready for Cath lab.

## 2018-12-23 NOTE — ED Triage Notes (Signed)
Pt states he ate rice and a tuna sandwich went to bed and woke up at 1030, with upper chest pain, vomited 3 times and was very diaphoretic.  He went back to sleep and woke up again with the chest pain at 0630.  States "I just feel bad".

## 2018-12-23 NOTE — H&P (Addendum)
Cardiology Admission History and Physical:   Patient ID: Keith Newton MRN: [id number]; DOB: 11/02/1955   Admission date: 12/23/2018  Primary Care Provider: Renford DillsPolite, Ronald, MD Primary Cardiologist:New to Dr. Tresa EndoKelly  Chief Complaint:  CP  Patient Profile:   Keith Newton is a 63 y.o. male with history of hypertension, hyperlipidemia, diabetes, tachycardia, family history of CAD and obesity presented for evaluation for chest pain and ruled in for non-STEMI.  Strong family hx of CAD in 6770s. MGF - died of MI in early 7570s PGF - died of MI in 4080s Father - died of MI at 1074 Sister - died of stroke   He was evaluated by me 05/2017 tachycardia.  Was noted sinus tachycardic.  Asymptomatic. Discussed with DOD Dr. Mayford Knifeurner. She recommended  24-hour Holter monitor, echocardiogram and GXT >> however patient has declined all. Stating "northing wrong with my heart". This is due to "lots of stress".  It was agreed for long acting Cardizem.  History of Present Illness:   Mr. Keith Newton was in usual state of health up until last night when woke up from sleep around 1030 with substernal chest pressure, diaphoresis and nausea.  He had rice and tuna sandwich in dinner at 7:00pm.  He had few episode of emesis. Eventually fall asleep however had a worsening pain this morning woking from sleep.  His chest tightness radiates to his back and left shoulder.  Associated with shortness of breath, nausea, vomiting and diaphoresis.  His pain has been constant since last night at 5 out of 10.  324 mg of aspirin and Zofran in ER.  Blood pressure 181/114 on presentation. Hs-troponin 4628.  Heart Pathway Score:     Past Medical History:  Diagnosis Date   Arthritis    Back pain    Carpal tunnel syndrome    DDD (degenerative disc disease)    Diabetes mellitus    Hyperlipidemia    Hypertension    states very mild   Low testosterone    Overweight    Seasonal allergies    Tachycardia     Past Surgical History:    Procedure Laterality Date   CARPAL TUNNEL RELEASE  07/02/2011   Procedure: CARPAL TUNNEL RELEASE;  Surgeon: Wyn Forsterobert V Sypher Jr., MD;  Location: [facility];  Service: Orthopedics;  Laterality: Right;   CARPAL TUNNEL RELEASE  07/30/2011   Procedure: CARPAL TUNNEL RELEASE;  Surgeon: Wyn Forsterobert V Sypher Jr., MD;  Location: [facility];  Service: Orthopedics;  Laterality: Left;   LUMBAR EPIDURAL INJECTION     x2     Medications Prior to Admission: Prior to Admission medications   Medication Sig Start Date End Date Taking? Authorizing Provider  atorvastatin (LIPITOR) 10 MG tablet Take 10 mg by mouth daily.    [provider]  diltiazem (CARDIZEM CD) 120 MG 24 hr capsule Take 1 capsule (120 mg total) by mouth daily. 06/15/17 09/13/17  Bhagat, Sharrell KuBhavinkumar, PA  glimepiride (AMARYL) 4 MG tablet Take 4 mg by mouth 2 (two) times daily.    [provider]  ibuprofen (ADVIL,MOTRIN) 800 MG tablet Take 800 mg by mouth every 8 (eight) hours as needed.    [provider]  losartan-hydrochlorothiazide (HYZAAR) 50-12.5 MG tablet Take 1 tablet by mouth daily.    [provider]  metFORMIN (GLUCOPHAGE) 500 MG tablet Take 1,000 mg by mouth 2 (two) times daily.    [provider]  testosterone cypionate (DEPOTESTOSTERONE CYPIONATE) 200 MG/ML injection Inject 1 mL into the  muscle 2 (two) times a week.    [provider]     Allergies:    Allergies  Allergen Reactions   Codeine Swelling    Social History:   Social History   Socioeconomic History   Marital status: Married    Spouse name: Not on file   Number of children: Not on file   Years of education: Not on file   Highest education level: Not on file  Occupational History   Not on file  Social Needs   Financial resource strain: Not on file   Food insecurity    Worry: Not on file    Inability: Not on file   Transportation needs    Medical: Not on file     Non-medical: Not on file  Tobacco Use   Smoking status: Never Smoker   Smokeless tobacco: Never Used  Substance and Sexual Activity   Alcohol use: No   Drug use: No   Sexual activity: Not on file  Lifestyle   Physical activity    Days per week: Not on file    Minutes per session: Not on file   Stress: Not on file  Relationships   Social connections    Talks on phone: Not on file    Gets together: Not on file    Attends religious service: Not on file    Active member of club or organization: Not on file    Attends meetings of clubs or organizations: Not on file    Relationship status: Not on file   Intimate partner violence    Fear of current or ex partner: Not on file    Emotionally abused: Not on file    Physically abused: Not on file    Forced sexual activity: Not on file  Other Topics Concern   Not on file  Social History Narrative   Not on file    Family History:   The patient's family history includes CAD in his father, maternal grandfather, and paternal grandfather; Stroke in his mother.    ROS:  Please see the history of present illness.  All other ROS reviewed and negative.     Physical Exam/Data:   Vitals:   12/23/18 1104 12/23/18 1105 12/23/18 1115 12/23/18 1130  BP: (!) 129/118  (!) 153/119 (!) 159/100  Pulse: 94  (!) 117   Resp:  19 18 (!) 30  Temp:      TempSrc:      SpO2: 96%  99%   Weight:      Height:       No intake or output data in the 24 hours ending 12/23/18 1155 Last 3 Weights 12/23/2018 06/15/2017 06/30/2011  Weight (lbs) 255 lb 266 lb 287 lb  Weight (kg) 115.667 kg 120.657 kg 130.182 kg     Body mass index is 38.77 kg/m.  General:  Well nourished, well developed, in no acute distres HEENT: normal Lymph: no adenopathy Neck: no  JVD Endocrine:  No thryomegaly Vascular: No carotid bruits; FA pulses 2+ bilaterally without bruits  Cardiac:  normal S1, S2; RRR; no murmur  Lungs:  clear to auscultation bilaterally, no wheezing,  rhonchi or rales  Abd: soft, nontender, no hepatomegaly  Ext: no edema Musculoskeletal:  No deformities, BUE and BLE strength normal and equal Skin: warm and dry  Neuro:  CNs 2-12 intact, no focal abnormalities noted Psych:  Normal affect    EKG:  The ECG that was done today  was personally reviewed and  demonstrates sinus tachycardia with new T wave inversion inferior laterally  Relevant CV Studies: Pending cath  Laboratory Data:  High Sensitivity Troponin:   Recent Labs  Lab 12/23/18 0938  TROPONINIHS 4,628*      Chemistry Recent Labs  Lab 12/23/18 0938  NA 138  K 4.8  CL 101  CO2 23  GLUCOSE 344*  BUN 10  CREATININE 1.12  CALCIUM 10.3  GFRNONAA >60  GFRAA >60  ANIONGAP 14    No results for input(s): PROT, ALBUMIN, AST, ALT, ALKPHOS, BILITOT in the last 168 hours. Hematology Recent Labs  Lab 12/23/18 0938  WBC 8.6  RBC 5.63  HGB 15.7  HCT 48.3  MCV 85.8  MCH 27.9  MCHC 32.5  RDW 14.1  PLT 285   BNPNo results for input(s): BNP, PROBNP in the last 168 hours.  DDimer No results for input(s): DDIMER in the last 168 hours.   Radiology/Studies:  Dg Chest 2 View  Result Date: 12/23/2018 CLINICAL DATA:  Chest pain, diaphoresis and vomiting since last night. EXAM: CHEST - 2 VIEW COMPARISON:  None. FINDINGS: Normal sized heart. Clear lungs. Mild thoracic spine degenerative changes. IMPRESSION: No acute abnormality. Electronically Signed   By: Beckie Salts M.D.   On: 12/23/2018 09:53    Assessment and Plan:   1. Non-STEMI Acute onset started last night.  Persistent 5 out of 10 chest pressure radiating to his back and shoulder.  EKG with new inferior lateral T wave changes.  Troponin elevated significantly.  He was given aspirin 324 mg in ER.  Start heparin and nitro gtt.  IV Lopressor 5 mg.  Cath today.  Get echocardiogram.  Significant cardiac risk factors as summarized above  The patient understands that risks include but are not limited to stroke (1 in  1000), death (1 in 1000), kidney failure [usually temporary] (1 in 500), bleeding (1 in 200), allergic reaction [possibly serious] (1 in 200), and agrees to proceed.   2.  Hypertension with urgency - differential of aortic dissection (less likely) -Blood pressure elevated -Start nitro gtt. -Add beta-blocker -Resume home Losartan -Hold hydrochlorothiazide  3.  Diabetes mellitus -Sliding scale insulin -Check hemoglobin A1c  4.  Sinus tachycardia -Add beta-blocker -Check TSH -Longstanding issue  Severity of Illness: The appropriate patient status for this patient is INPATIENT. Inpatient status is judged to be reasonable and necessary in order to provide the required intensity of service to ensure the patient's safety. The patient's presenting symptoms, physical exam findings, and initial radiographic and laboratory data in the context of their chronic comorbidities is felt to place them at high risk for further clinical deterioration. Furthermore, it is not anticipated that the patient will be medically stable for discharge from the hospital within 2 midnights of admission. The following factors support the patient status of inpatient.   " The patient's presenting symptoms include CP,SOB. " The worrisome physical exam findings include N//A " The initial radiographic and laboratory data are worrisome because of abnormal EKG and troponin " The chronic co-morbidities include family history of CAD, hypertension, hyperlipidemia and diabetes   * I certify that at the point of admission it is my clinical judgment that the patient will require inpatient hospital care spanning beyond 2 midnights from the point of admission due to high intensity of service, high risk for further deterioration and high frequency of surveillance required.*    For questions or updates, please contact [facility] Please consult [email or web address] for contact info under  Lorelei Pont, [place]    12/23/2018 11:55 AM    Patient seen and examined. Agree with assessment and plan.  Mr. Aleczander Fandino is a 63 year old African-American male who has a history of hypertension, hyperlipidemia, diabetes mellitus, and strong family history for CAD.  April 2019 he was seen in the office and noted to have sinus tachycardia for which she was asymptomatic with his blood pressure history was started on diltiazem.  He had retired this year from the [employer] he has been staying at home mostly since January.  Last evening after going to bed for several hours he developed severe substernal chest pressure which awakened him from sleep associated with diaphoresis.  His chest pain persisted off and on throughout the night radiating to his back and left shoulder.  He ultimately presented to the ER this morning.  Initial high-sensitivity troponin is 4628 and ECG shows inferolateral T wave abnormality consistent with acute coronary syndrome/NSTEMI.  Upon presentation he was tachycardic.  He was given Lopressor 5 mg IV.  IV nitroglycerin was instituted and I have subsequently titrated this so far up to 20 mcg with improvement in his chest pain.  He has mild residual discomfort.  He has been given a heparin bolus and will be started on a drip.  He has been given baby aspirin.  Physical exam is notable for obese gentleman with mild residual chest pain.  There is no JVD.  He has a thick neck.  Lungs are clear.  There is no chest wall tenderness.  Rhythm was regular with no ectopy.  He had mild central adiposity.  Pulses were 2+.  There was no clubbing cyanosis or edema.  Sinus tachycardia has improved to heart rate in the 90s following intravenous metoprolol.  I suspect the patient may very well have obstructive sleep apnea and developed significant nocturnal oxygen desaturation last evening which may have precipitated his acute coronary syndrome/ischemia.  I suspect he most likely has subtotal large RCA stenosis supplying  the posterolateral wall or LCX disease.   I have reviewed the risks, indications, and alternatives to cardiac catheterization, possible angioplasty, and stenting with the patient. Risks include but are not limited to bleeding, infection, vascular injury, stroke, myocardial infection, arrhythmia, kidney injury, radiation-related injury in the case of prolonged fluoroscopy use, emergency cardiac surgery, and death. The patient understands the risks of serious complication is 1-2 in 1000 with diagnostic cardiac cath and 1-2% or less with angioplasty/stenting.  Patient agrees to undergo this procedure try to schedule the next case as soon as possible.  We will initiate high potency statin therapy, continue beta-blocker post procedure.  Lennette Bihari, MD, [facility] 12/23/2018 12:23 PM

## 2018-12-23 NOTE — ED Notes (Signed)
Consent was obtaioned for cath lab

## 2018-12-23 NOTE — Progress Notes (Signed)
  Echocardiogram 2D Echocardiogram has been performed.  Evella Kasal G Starr Engel 12/23/2018, 5:11 PM

## 2018-12-23 NOTE — ED Notes (Signed)
Pt states that his CP has went down enough to where he does not want the fentanyl dose that is ordered. Daughter Tamala Fothergill, [phone]), at bedside is supportive & informed as well as the pt what the plan is so far & has been updated from the PA's & MD's that has came to the bedside. VSS at this time, pt is resting.

## 2018-12-24 DIAGNOSIS — Z955 Presence of coronary angioplasty implant and graft: Secondary | ICD-10-CM | POA: Diagnosis not present

## 2018-12-24 DIAGNOSIS — I214 Non-ST elevation (NSTEMI) myocardial infarction: Secondary | ICD-10-CM | POA: Diagnosis not present

## 2018-12-24 LAB — BASIC METABOLIC PANEL
Anion gap: 11 (ref 5–15)
BUN: 8 mg/dL (ref 8–23)
CO2: 22 mmol/L (ref 22–32)
Calcium: 9.2 mg/dL (ref 8.9–10.3)
Chloride: 104 mmol/L (ref 98–111)
Creatinine, Ser: 0.97 mg/dL (ref 0.61–1.24)
GFR calc Af Amer: >60 mL/min (ref >60)
GFR calc non Af Amer: >60 mL/min (ref >60)
Glucose, Bld: 192 mg/dL — ABNORMAL HIGH (ref 70–99)
Potassium: 3.6 mmol/L (ref 3.5–5.1)
Sodium: 137 mmol/L (ref 135–145)

## 2018-12-24 LAB — LIPID PANEL
Cholesterol: 191 mg/dL (ref 0–200)
HDL: 29 mg/dL — ABNORMAL LOW (ref >40)
LDL Cholesterol: 137 mg/dL — ABNORMAL HIGH (ref 0–99)
Total CHOL/HDL Ratio: 6.6 RATIO
Triglycerides: 124 mg/dL (ref <150)
VLDL: 25 mg/dL (ref 0–40)

## 2018-12-24 LAB — GLUCOSE, CAPILLARY
Glucose-Capillary: 179 mg/dL — ABNORMAL HIGH (ref 70–99)
Glucose-Capillary: 167 mg/dL — ABNORMAL HIGH (ref 70–99)
Glucose-Capillary: 175 mg/dL — ABNORMAL HIGH (ref 70–99)
Glucose-Capillary: 137 mg/dL — ABNORMAL HIGH (ref 70–99)

## 2018-12-24 LAB — MRSA PCR SCREENING: MRSA by PCR: NEGATIVE

## 2018-12-24 LAB — CBC
HCT: 42.9 % (ref 39.0–52.0)
Hemoglobin: 13.9 g/dL (ref 13.0–17.0)
MCH: 27.5 pg (ref 26.0–34.0)
MCHC: 32.4 g/dL (ref 30.0–36.0)
MCV: 84.8 fL (ref 80.0–100.0)
Platelets: 275 10*3/uL (ref 150–400)
RBC: 5.06 MIL/uL (ref 4.22–5.81)
RDW: 14.5 % (ref 11.5–15.5)
WBC: 8.3 10*3/uL (ref 4.0–10.5)
nRBC: 0 % (ref 0.0–0.2)

## 2018-12-24 NOTE — Plan of Care (Signed)

## 2018-12-24 NOTE — Plan of Care (Signed)
  Problem: Education: Goal: Understanding of cardiac disease, CV risk reduction, and recovery process will improve Outcome: Progressing Goal: Individualized Educational Video(s) Outcome: Progressing   Problem: Activity: Goal: Ability to tolerate increased activity will improve Outcome: Progressing   Problem: Cardiac: Goal: Ability to achieve and maintain adequate cardiovascular perfusion will improve Outcome: Progressing   Problem: Health Behavior/Discharge Planning: Goal: Ability to safely manage health-related needs after discharge will improve Outcome: Progressing   Problem: Education: Goal: Knowledge of General Education information will improve Description: Including pain rating scale, medication(s)/side effects and non-pharmacologic comfort measures Outcome: Progressing   Problem: Health Behavior/Discharge Planning: Goal: Ability to manage health-related needs will improve Outcome: Progressing   Problem: Clinical Measurements: Goal: Ability to maintain clinical measurements within normal limits will improve Outcome: Progressing Goal: Will remain free from infection Outcome: Progressing Goal: Diagnostic test results will improve Outcome: Progressing Goal: Respiratory complications will improve Outcome: Progressing Goal: Cardiovascular complication will be avoided Outcome: Progressing   Problem: Activity: Goal: Risk for activity intolerance will decrease Outcome: Progressing   Problem: Nutrition: Goal: Adequate nutrition will be maintained Outcome: Progressing   Problem: Coping: Goal: Level of anxiety will decrease Outcome: Progressing   Problem: Elimination: Goal: Will not experience complications related to bowel motility Outcome: Progressing Goal: Will not experience complications related to urinary retention Outcome: Progressing   Problem: Safety: Goal: Ability to remain free from injury will improve Outcome: Progressing   Problem: Skin  Integrity: Goal: Risk for impaired skin integrity will decrease Outcome: Progressing

## 2018-12-24 NOTE — Research (Signed)
AEGIS II Informed Consent   Subject Name: Keith Newton  Subject met inclusion and exclusion criteria.  The informed consent form, study requirements and expectations were reviewed with the subject and questions and concerns were addressed prior to the signing of the consent form.  The subject verbalized understanding of the trail requirements.  The subject agreed to participate in the AEGIS II trial and signed the informed consent.  The informed consent was obtained prior to performance of any protocol-specific procedures for the subject.  A copy of the signed informed consent was given to the subject and a copy was placed in the subject's medical record.  Berneda Rose 12/24/2018, 10:36 AM

## 2018-12-24 NOTE — Progress Notes (Signed)
   Progress Note   Subjective   Doing well today, the patient denies CP or SOB.  No new concerns  Inpatient Medications    Scheduled Meds: . aspirin EC  81 mg Oral Daily  . atorvastatin  80 mg Oral q1800  . fentaNYL (SUBLIMAZE) injection  50 mcg Intravenous Once  . insulin aspart  0-15 Units Subcutaneous TID WC  . losartan  50 mg Oral Daily  . metoprolol tartrate  25 mg Oral BID  . sodium chloride flush  3 mL Intravenous Q12H  . sodium chloride flush  3 mL Intravenous Q12H  . ticagrelor  90 mg Oral BID   Continuous Infusions: . sodium chloride 125 mL/hr at 12/23/18 1159  . sodium chloride    . nitroGLYCERIN Stopped (12/23/18 1416)   PRN Meds: sodium chloride, acetaminophen, nitroGLYCERIN, ondansetron (ZOFRAN) IV, sodium chloride flush   Vital Signs    Vitals:   12/23/18 2329 12/24/18 0330 12/24/18 0718 12/24/18 0839  BP: 120/84 127/85 (!) 135/92   Pulse: 94 86 82 83  Resp: 16 18 15  (!) 26  Temp: 98.5 F (36.9 C) 98.6 F (37 C) 98.6 F (37 C)   TempSrc: Oral Oral Oral   SpO2: 93% 95% 95% 96%  Weight:      Height:        Intake/Output Summary (Last 24 hours) at 12/24/2018 1001 Last data filed at 12/24/2018 6269 Gross per 24 hour  Intake 1000 ml  Output 1100 ml  Net -100 ml   Filed Weights   12/23/18 0919  Weight: 115.7 kg    Telemetry    sinus - Personally Reviewed  Physical Exam   GEN- The patient is well appearing, alert and oriented x 3 today.   Head- normocephalic, atraumatic Eyes-  Sclera clear, conjunctiva pink Ears- hearing intact Oropharynx- clear Neck- supple, Lungs- normal work of breathing Heart- Regular rate and rhythm  GI- not examined due to COVID distancing Extremities- not examined due to COVID distancing MS- no significant deformity or atrophy Skin- no rash or lesion Psych- euthymic mood, full affect   Labs    Chemistry Recent Labs  Lab 12/23/18 0938 12/24/18 0214  NA 138 137  K 4.8 3.6  CL 101 104  CO2 23 22   GLUCOSE 344* 192*  BUN 10 8  CREATININE 1.12 0.97  CALCIUM 10.3 9.2  GFRNONAA >60 >60  GFRAA >60 >60  ANIONGAP 14 11     Hematology Recent Labs  Lab 12/23/18 0938 12/24/18 0214  WBC 8.6 8.3  RBC 5.63 5.06  HGB 15.7 13.9  HCT 48.3 42.9  MCV 85.8 84.8  MCH 27.9 27.5  MCHC 32.5 32.4  RDW 14.1 14.5  PLT 285 275     Patient Profile:   Keith Newton is a 63 y.o. male with history of hypertension, hyperlipidemia, diabetes, tachycardia, family history of CAD and obesity presented for evaluation for chest pain and ruled in for non-STEMI.  Now s/p PCI of occluded RCA by Dr Irish Lack 12/23/2018.  Assessment & Plan    1.  NSTEMI Doing very well s/p PCI of RCA Aggressive secondary prvention with weight loss, diabetes control, HD statin are advised.  Continue ticagrelor. Transfer to telemetry today  2. Hypertensive urgency Resolved  3. Diabetes Stable No change required today  Hopefully home in am  Thompson Grayer MD, [facility] 12/24/2018 10:01 AM

## 2018-12-24 NOTE — Progress Notes (Signed)
CARDIAC REHAB PHASE I   PRE:  Rate/Rhythm: 92 SR  BP:  Supine: 140/103  Sitting:   Standing:    SaO2: 96 RA  MODE:  Ambulation: 340 ft   POST:  Rate/Rhythm: 100 SR  BP:  Supine:   Sitting: 130/61  Standing:    SaO2: 96 RA 1045-1200 Assisted X 1 to ambulate. Pt. a little unsteady at first walking but improved with distance. He walked 340 feet without c/o of cp. Pt to recliner after walk with call light in reach. Compleetd MI and stent education with pt.wife also present. Discussed MI, stent, risk factors, ASA, Brilinta, heart healthy diabetic diet, exercise guidelines, proper use of sl NTG, calling 911 and Outpt. CRP. Will send referral to [place] program.  Rodney Langton RN 12/24/2018 12:04 PM

## 2018-12-25 ENCOUNTER — Encounter (HOSPITAL_COMMUNITY): Payer: Self-pay | Admitting: Student

## 2018-12-25 DIAGNOSIS — Z955 Presence of coronary angioplasty implant and graft: Secondary | ICD-10-CM | POA: Diagnosis not present

## 2018-12-25 DIAGNOSIS — I214 Non-ST elevation (NSTEMI) myocardial infarction: Secondary | ICD-10-CM | POA: Diagnosis not present

## 2018-12-25 DIAGNOSIS — E785 Hyperlipidemia, unspecified: Secondary | ICD-10-CM

## 2018-12-25 DIAGNOSIS — E1169 Type 2 diabetes mellitus with other specified complication: Secondary | ICD-10-CM

## 2018-12-25 LAB — COMPREHENSIVE METABOLIC PANEL
ALT: 25 U/L (ref 0–44)
AST: 51 U/L — ABNORMAL HIGH (ref 15–41)
Albumin: 3.2 g/dL — ABNORMAL LOW (ref 3.5–5.0)
Alkaline Phosphatase: 52 U/L (ref 38–126)
Anion gap: 10 (ref 5–15)
BUN: 11 mg/dL (ref 8–23)
CO2: 23 mmol/L (ref 22–32)
Calcium: 9.2 mg/dL (ref 8.9–10.3)
Chloride: 104 mmol/L (ref 98–111)
Creatinine, Ser: 1.21 mg/dL (ref 0.61–1.24)
GFR calc Af Amer: >60 mL/min (ref >60)
GFR calc non Af Amer: >60 mL/min (ref >60)
Glucose, Bld: 257 mg/dL — ABNORMAL HIGH (ref 70–99)
Potassium: 3.8 mmol/L (ref 3.5–5.1)
Sodium: 137 mmol/L (ref 135–145)
Total Bilirubin: 1 mg/dL (ref 0.3–1.2)
Total Protein: 6.8 g/dL (ref 6.5–8.1)

## 2018-12-25 LAB — GLUCOSE, CAPILLARY: Glucose-Capillary: 147 mg/dL — ABNORMAL HIGH (ref 70–99)

## 2018-12-25 MED ORDER — ASPIRIN 81 MG PO TBEC: 81.0000 mg | DELAYED_RELEASE_TABLET | Freq: Every day | ORAL | Status: AC

## 2018-12-25 MED ORDER — NITROGLYCERIN 0.4 MG SL SUBL: 0.4000 mg | SUBLINGUAL_TABLET | SUBLINGUAL | 3 refills | Status: DC | PRN

## 2018-12-25 MED ORDER — METOPROLOL TARTRATE 25 MG PO TABS: 25.0000 mg | ORAL_TABLET | Freq: Two times a day (BID) | ORAL | 1 refills | Status: DC

## 2018-12-25 MED ORDER — TICAGRELOR 90 MG PO TABS: 90.0000 mg | ORAL_TABLET | Freq: Two times a day (BID) | ORAL | 3 refills | Status: DC

## 2018-12-25 MED ORDER — LOSARTAN POTASSIUM 50 MG PO TABS: 50.0000 mg | ORAL_TABLET | Freq: Every day | ORAL | 1 refills | Status: DC

## 2018-12-25 MED ORDER — ATORVASTATIN CALCIUM 80 MG PO TABS: 80.0000 mg | ORAL_TABLET | Freq: Every day | ORAL | 1 refills | Status: DC

## 2018-12-25 NOTE — Discharge Instructions (Signed)
Radial Site Care ° °This sheet gives you information about how to care for yourself after your procedure. Your health care provider may also give you more specific instructions. If you have problems or questions, contact your health care provider. °What can I expect after the procedure? °After the procedure, it is common to have: °· Bruising and tenderness at the catheter insertion area. °Follow these instructions at home: °Medicines °· Take over-the-counter and prescription medicines only as told by your health care provider. °Insertion site care °· Follow instructions from your health care provider about how to take care of your insertion site. Make sure you: °? Wash your hands with soap and water before you change your bandage (dressing). If soap and water are not available, use hand sanitizer. °? Change your dressing as told by your health care provider. °? Leave stitches (sutures), skin glue, or adhesive strips in place. These skin closures may need to stay in place for 2 weeks or longer. If adhesive strip edges start to loosen and curl up, you may trim the loose edges. Do not remove adhesive strips completely unless your health care provider tells you to do that. °· Check your insertion site every day for signs of infection. Check for: °? Redness, swelling, or pain. °? Fluid or blood. °? Pus or a bad smell. °? Warmth. °· Do not take baths, swim, or use a hot tub until your health care provider approves. °· You may shower 24-48 hours after the procedure, or as directed by your health care provider. °? Remove the dressing and gently wash the site with plain soap and water. °? Pat the area dry with a clean towel. °? Do not rub the site. That could cause bleeding. °· Do not apply powder or lotion to the site. °Activity ° °· For 24 hours after the procedure, or as directed by your health care provider: °? Do not flex or bend the affected arm. °? Do not push or pull heavy objects with the affected arm. °? Do not  drive yourself home from the hospital or clinic. You may drive 24 hours after the procedure unless your health care provider tells you not to. °? Do not operate machinery or power tools. °· Do not lift anything that is heavier than 10 lb (4.5 kg), or the limit that you are told, until your health care provider says that it is safe. °· Ask your health care provider when it is okay to: °? Return to work or school. °? Resume usual physical activities or sports. °? Resume sexual activity. °General instructions °· If the catheter site starts to bleed, raise your arm and put firm pressure on the site. If the bleeding does not stop, get help right away. This is a medical emergency. °· If you went home on the same day as your procedure, a responsible adult should be with you for the first 24 hours after you arrive home. °· Keep all follow-up visits as told by your health care provider. This is important. °Contact a health care provider if: °· You have a fever. °· You have redness, swelling, or yellow drainage around your insertion site. °Get help right away if: °· You have unusual pain at the radial site. °· The catheter insertion area swells very fast. °· The insertion area is bleeding, and the bleeding does not stop when you hold steady pressure on the area. °· Your arm or hand becomes pale, cool, tingly, or numb. °These symptoms may represent a serious problem   that is an emergency. Do not wait to see if the symptoms will go away. Get medical help right away. Call your local emergency services (911 in the [place] Do not drive yourself to the hospital. °Summary °· After the procedure, it is common to have bruising and tenderness at the site. °· Follow instructions from your health care provider about how to take care of your radial site wound. Check the wound every day for signs of infection. °· Do not lift anything that is heavier than 10 lb (4.5 kg), or the limit that you are told, until your health care provider says  that it is safe. °This information is not intended to replace advice given to you by your health care provider. Make sure you discuss any questions you have with your health care provider. °Document Released: 03/07/2010 Document Revised: 03/10/2017 Document Reviewed: 03/10/2017 °Elsevier Patient Education © 2020 [employer] ° °

## 2018-12-25 NOTE — Discharge Summary (Addendum)
Discharge Summary    Patient ID: Keith Newton,  MRN: [id number], DOB/AGE: 1955/10/03 63 y.o.  Admit date: 12/23/2018 Discharge date: 12/25/2018  Primary Care Provider: Renford Dills Primary Cardiologist: Previous NP APP Visit in 05/2017 but did not follow-up with MD --> wishes to establish with Nicki Guadalajara, MD   Discharge Diagnoses    Principal Problem:   NSTEMI (non-ST elevated myocardial infarction) [facility]) Active Problems:   Hypertension   Type 2 diabetes mellitus with complication (HCC)   Hyperlipidemia LDL goal <70   History of Present Illness     Keith Newton is 63 y.o. male with past medical history of HTN, HLD, Type 2 DM, tachycardia, and family history of CAD who presented to [facility] ED on 12/23/2018 for evaluation of chest pain.   He had previously been evaluated by Cardiology in 05/2017 for tachycardia but declined a Holter monitor and stress testing at that time.   At the time of this admission, he reported being in his usual state of health until he awoke from sleep with substernal chest pressure and associated nausea and diaphoresis. His discomfort did radiate into his back and left shoulder. EKG showed NSR with inferolateral T-wave abnormality and initial HS Troponin was elevated to 4628 (peaked at > 27000), therefore he was started on Heparin and admitted for a catheterization to be performed later that day.   Hospital Course     Consultants: None  Catheterization was performed by Dr. Eldridge Dace and showed 80% Prox-RCA stenosis followed by 100% prox to mid-RCA stenosis. Underwent DESx2 to the RCA. He did have residual 75% RPDA stenosis but the vessel was small in caliber and medical management was recommended. Had residual 50% LCx, 40% mid-LAD and 25% mid-distal LAD stenosis. EF was preserved at 55-65% by visual estimate with echocardiogram later confirming this but noted to have basal inferior HK. He was started on DAPT with ASA and Brilinta along with BB and  statin therapy (LDL elevated to 137 during admission).   The day following his procedure, he denied any recurrent chest pain or dyspnea. Follow-up labs showed stable renal function with creatinine at 0.97 and Hgb 13.9. He ambulated over 340 ft with cardiac rehab without angina symptoms.   He was examined by Dr. Johney Frame on 12/25/2018 and deemed stable for discharge. A hospital follow-up appointment has been arranged with APP at the [facility]. By review of upcoming procedures, he is scheduled for a colonoscopy in 01/2019 and this will need to be postponed as DAPT cannot be interrupted for at least 6 months, ideally 12 months. I reviewed this with the patient today and informed him to call his GI Office to have this canceled. He was discharged home in good condition.  _____________  Discharge Physical Exam and Vitals Blood pressure 119/79, pulse 96, temperature 98.1 F (36.7 C), temperature source Oral, resp. rate (!) 31, height 5\' 8"  (1.727 m), weight 115.7 kg, SpO2 94 %.  Filed Weights   12/23/18 0919  Weight: 115.7 kg   General: Pleasant male appearing in NAD Psych: Normal affect. Neuro: Alert and oriented X 3. Moves all extremities spontaneously. HEENT: Normal  Neck: Supple without bruits or JVD. Lungs:  Resp regular and unlabored, CTA without wheezing or rales. Heart: RRR no s3, s4, or murmurs. Abdomen: Soft, non-tender, non-distended, BS + x 4.  Extremities: No clubbing, cyanosis or lower extremity edema. DP/PT/Radials 2+ and equal bilaterally.  Labs & Radiologic Studies     CBC Recent Labs  12/23/18 0938 12/24/18 0214  WBC 8.6 8.3  HGB 15.7 13.9  HCT 48.3 42.9  MCV 85.8 84.8  PLT 285 275   Basic Metabolic Panel Recent Labs    40/98/1109/09/04 0214 12/25/18 0808  NA 137 137  K 3.6 3.8  CL 104 104  CO2 22 23  GLUCOSE 192* 257*  BUN 8 11  CREATININE 0.97 1.21  CALCIUM 9.2 9.2   Liver Function Tests Recent Labs    12/25/18 0808  AST 51*  ALT 25  ALKPHOS 52  BILITOT  1.0  PROT 6.8  ALBUMIN 3.2*   No results for input(s): LIPASE, AMYLASE in the last 72 hours. Cardiac Enzymes No results for input(s): CKTOTAL, CKMB, CKMBINDEX, TROPONINI in the last 72 hours. BNP Invalid input(s): POCBNP D-Dimer No results for input(s): DDIMER in the last 72 hours. Hemoglobin A1C Recent Labs    12/23/18 0939  HGBA1C 11.3*   Fasting Lipid Panel Recent Labs    12/24/18 0214  CHOL 191  HDL 29*  LDLCALC 137*  TRIG 124  CHOLHDL 6.6   Thyroid Function Tests Recent Labs    12/23/18 1247  TSH 0.418    Dg Chest 2 View  Result Date: 12/23/2018 CLINICAL DATA:  Chest pain, diaphoresis and vomiting since last night. EXAM: CHEST - 2 VIEW COMPARISON:  None. FINDINGS: Normal sized heart. Clear lungs. Mild thoracic spine degenerative changes. IMPRESSION: No acute abnormality. Electronically Signed   By: Beckie SaltsSteven  Reid M.D.   On: 12/23/2018 09:53     Diagnostic Studies/Procedures     Cardiac Catheterization: 12/23/2018  Prox - mid RCA-2 lesion is 100% stenosed. This was the culprit lesion.  A drug-eluting stent was successfully placed using a STENT SYNERGY DES [id number].  Post intervention, there is a 0% residual stenosis.  Prox RCA-1 lesion is 80% stenosed.  A drug-eluting stent was successfully placed using a STENT SYNERGY DES [id number], overlapping the first stent.  Post intervention, there is a 0% residual stenosis.  RPDA lesion is 75% stenosed. This was a small vessel. Medical treatment planned.  Mid Cx lesion is 50% stenosed.  Mid LAD lesion is 40% stenosed.  Mid LAD to Dist LAD lesion is 25% stenosed.  The left ventricular systolic function is normal.  LV end diastolic pressure is normal.  The left ventricular ejection fraction is 55-65% by visual estimate.  There is no aortic valve stenosis.  Tortuosity in right subclavian did not allow stiffer Cordis XBRCA catheter to pass into ascending aorta. Medtronic Launcher catheter was able to pass from  right radial approach.   Watch in 2H.  Continue aggressive secondary prevention.  He needs weight loss and DM control, high dose statin and beta blocker.   He is interested in learning more about exercise and healthy diet.  Recommend cardiac rehab.   DAPT for 1 year given ACS.  Echocardiogram: 12/23/2018 IMPRESSIONS    1. Left ventricular ejection fraction, by visual estimation, is 55 to 60%. The left ventricle has normal function. There is moderately increased left ventricular hypertrophy. Basal inferior hypokinesis.  2. Global right ventricle has mildly reduced systolic function.The right ventricular size is normal. No increase in right ventricular wall thickness.  3. Left atrial size was normal.  4. Right atrial size was normal.  5. The mitral valve is normal in structure. No evidence of mitral valve regurgitation.  6. The tricuspid valve is normal in structure. Tricuspid valve regurgitation is trivial.  7. The aortic valve is tricuspid. Aortic valve regurgitation is not  visualized.  8. The pulmonic valve was not well visualized. Pulmonic valve regurgitation is trivial.  Disposition   Pt is being discharged home today in good condition.  Follow-up Plans & Appointments    Follow-up Information    Abelino Derrick, PA-C Follow up on 01/02/2019.   Specialties: Cardiology, Radiology Why: Cardiology Hospital Follow-up on 01/02/2019 at 2:30 PM with [name], PA-C (works with Dr. Tresa Endo).  Contact information: [address] [place] [address] [phone]          Discharge Instructions    AMB Referral to Cardiac Rehabilitation - Phase II   Complete by: As directed    Diagnosis:  Coronary Stents NSTEMI     After initial evaluation and assessments completed: Virtual Based Care may be provided alone or in conjunction with Phase 2 Cardiac Rehab based on patient barriers.: Yes   Amb Referral to Cardiac Rehabilitation   Complete by: As directed     Diagnosis:  NSTEMI Coronary Stents     After initial evaluation and assessments completed: Virtual Based Care may be provided alone or in conjunction with Phase 2 Cardiac Rehab based on patient barriers.: Yes   Diet - low sodium heart healthy   Complete by: As directed    Discharge instructions   Complete by: As directed    PLEASE REMEMBER TO BRING ALL OF YOUR MEDICATIONS TO EACH OF YOUR FOLLOW-UP OFFICE VISITS.  PLEASE ATTEND ALL SCHEDULED FOLLOW-UP APPOINTMENTS.   Activity: Increase activity slowly as tolerated. You may shower, but no soaking baths (or swimming) for 1 week. No driving for 48 hours. No lifting over 10 lbs for 2 weeks. No sexual activity for 2 weeks.   You May Return to Work: in 1 week (if applicable)  Wound Care: You may wash cath site gently with soap and water. Keep cath site clean and dry. If you notice pain, swelling, bleeding or pus at your cath site, please call [phone].   Increase activity slowly   Complete by: As directed       Discharge Medications     Medication List    STOP taking these medications   diltiazem 120 MG 24 hr capsule Commonly known as: CARDIZEM CD   metoprolol-hydrochlorothiazide 100-25 MG tablet Commonly known as: LOPRESSOR HCT     TAKE these medications   aspirin 81 MG EC tablet Take 1 tablet (81 mg total) by mouth daily.   atorvastatin 80 MG tablet Commonly known as: LIPITOR Take 1 tablet (80 mg total) by mouth daily. What changed:   medication strength  how much to take   losartan 50 MG tablet Commonly known as: COZAAR Take 1 tablet (50 mg total) by mouth daily.   metFORMIN 500 MG tablet Commonly known as: GLUCOPHAGE Take 500 mg by mouth 2 (two) times daily. Notes to patient: RESTART ON 12/26/2018.   metoprolol tartrate 25 MG tablet Commonly known as: LOPRESSOR Take 1 tablet (25 mg total) by mouth 2 (two) times daily.   nitroGLYCERIN 0.4 MG SL tablet Commonly known as: NITROSTAT Place 1 tablet (0.4 mg  total) under the tongue every 5 (five) minutes x 3 doses as needed for chest pain.   ticagrelor 90 MG Tabs tablet Commonly known as: BRILINTA Take 1 tablet (90 mg total) by mouth 2 (two) times daily.   Evaristo Bury FlexTouch 100 UNIT/ML Sopn FlexTouch Pen Generic drug: insulin degludec Inject 30 Units into the skin daily.         Allergies Allergies  Allergen  Reactions   Codeine Swelling    Lips and facial swelling     Yes                               AHA/ACC Clinical Performance & Quality Measures: 1. Aspirin prescribed? - Yes 2. ADP Receptor Inhibitor (Plavix/Clopidogrel, Brilinta/Ticagrelor or Effient/Prasugrel) prescribed (includes medically managed patients)? - Yes 3. Beta Blocker prescribed? - Yes 4. High Intensity Statin (Lipitor 40-80mg  or Crestor 20-40mg ) prescribed? - Yes 5. EF assessed during THIS hospitalization? - Yes 6. For EF <40%, was ACEI/ARB prescribed? - Not Applicable (EF >/= 33%) 7. For EF <40%, Aldosterone Antagonist (Spironolactone or Eplerenone) prescribed? - Not Applicable (EF >/= 35%) 8. Cardiac Rehab Phase II ordered (Included Medically managed Patients)? - Yes    Outstanding Labs/Studies   BMET at follow-up appointment given initiation of Losartan.   FLP and LFT's in 6-8 weeks.   Duration of Discharge Encounter   Greater than 30 minutes including physician time.  Signed, Erma Heritage, PA-C 12/25/2018, 10:24 AM   I have seen, examined the patient, and reviewed the above assessment and plan.  Changes to above are made where necessary.  On exam, RRR.  Doing well today.  Wants to go home.  DC to home with close outpatient cardiology care.  Co Sign: Thompson Grayer, MD 12/25/2018 10:46 AM

## 2018-12-25 NOTE — Care Management (Signed)
Spoke w patient at bedside. Provided with Brilinta 30day free card and insurance copay card. Explained that he needs to provide pharmacist with cards when he picks up prescription at pharmacy. Patient verbalized understanding.

## 2018-12-25 NOTE — Progress Notes (Signed)
Removed PIV access and received discharge instruction. Pt took his all belongings. HS [employer]

## 2018-12-26 ENCOUNTER — Ambulatory Visit (HOSPITAL_COMMUNITY)
Admission: RE | Admit: 2018-12-26 | Discharge: 2018-12-26 | Disposition: A | Discharge status: Home / Self Care | Payer: BC Managed Care – PPO | Source: Ambulatory Visit | Attending: Internal Medicine | Admitting: Internal Medicine

## 2018-12-26 ENCOUNTER — Encounter: Payer: BC Managed Care – PPO | Admitting: *Deleted

## 2018-12-26 ENCOUNTER — Other Ambulatory Visit: Payer: Self-pay

## 2018-12-26 ENCOUNTER — Telehealth: Payer: Self-pay

## 2018-12-26 ENCOUNTER — Encounter (HOSPITAL_COMMUNITY): Payer: Self-pay | Admitting: Interventional Cardiology

## 2018-12-26 VITALS — BP 105/67 | HR 84

## 2018-12-26 DIAGNOSIS — Z006 Encounter for examination for normal comparison and control in clinical research program: Secondary | ICD-10-CM

## 2018-12-26 MED ORDER — STUDY - AEGIS II STUDY - PLACEBO OR CSL112 (PI-HILTY)
170.0000 mL | Freq: Once | INTRAVENOUS | Status: AC
  Administered 2018-12-26: 170 mL via INTRAVENOUS
  Filled 2018-12-26: qty 170

## 2018-12-26 MED FILL — Tirofiban HCl in NaCl 0.9% IV Soln 5 MG/100ML (Base Equiv): INTRAVENOUS | Qty: 100 | Status: AC

## 2018-12-26 NOTE — Telephone Encounter (Signed)
Colonoscopy scheduled for December 2020 cancelled due to cardiac stents placed. Recall colon in for 6 months

## 2018-12-26 NOTE — Telephone Encounter (Signed)
-----   Message from Yetta Flock, MD sent at 12/26/2018 11:06 AM EST ----- Regarding: RE: Outpatient Colonoscopy Thanks for letting us know, I really appreciate it.  Sherlynn Stalls can you please cancel this patient's case in December due to this and I would like to see him in the office for a clinic visit in 6 months for reassessment and discuss timing for colonoscopy. Thank you.  Richardson Landry ----- Message ----- From: Waynetta Pean Sent: 12/25/2018  10:03 AM EST To: Yetta Flock, MD Subject: Outpatient Colonoscopy                         Dr. Havery Moros,   I wanted to reach out to you about this mutual patient. It appears he is scheduled for a colonoscopy in 01/2019. He was just admitted to the hospital for an NSTEMI and had two stents to the RCA. Given the need for uninterrupted DAPT for at least 6 months and ideally 12 months, I informed him his colonoscopy would need to be rescheduled and wanted to make you aware as well. I encouraged him to contact your office.   If you have any questions or concerns, feel free to reach out to our office at [phone].  Thanks,  [place]

## 2018-12-26 NOTE — Research (Signed)
Patient seen and examined.  Seen yesterday by Berneda Rose, RN.  Patient expressed desire to participate in the AEGIS II trial.  Petra Kuba of trial, randomization strategy, and purpose reviewed with patient.  He plans to return in the am for drug infusion.   VSS as noted.  HR slightly elevated, but patient animated talking football.  Lungs clear.  Minimal SEM.  No diastolic murmur.   No significant edema.    Patient plans return in am for randomization.   [place] Class I.   Addison Lank, MD, [facility], [place] Director, [facility].

## 2018-12-26 NOTE — Research (Signed)
AEGIS V1 and V2  Patient doing well, no complaints of cp or sob.                                     "CONSENT"   YES     NO   Continuing further Investigational Product and study visits for follow-up? [x]  []   Continuing consent from future biomedical research [x]  []                                    "EVENTS"    YES     NO  AE   (IF YES SEE SOURCE) []  [x]   SAE  (IF YES SEE SOURCE) []  [x]   ENDPOINT   (IF YES SEE SOURCE) []  [x]   REVASCULARIZATION  (IF YES SEE SOURCE) []  [x]   AMPUTATION   (IF YES SEE SOURCE) []  [x]   TROPONIN'S  (IF YES SEE SOURCE) []  [x]    Central Labs:       Current Outpatient Medications:  .  aspirin EC 81 MG EC tablet, Take 1 tablet (81 mg total) by mouth daily., Disp:  , Rfl:  .  atorvastatin (LIPITOR) 80 MG tablet, Take 1 tablet (80 mg total) by mouth daily., Disp: 90 tablet, Rfl: 1 .  insulin degludec (TRESIBA FLEXTOUCH) 100 UNIT/ML SOPN FlexTouch Pen, Inject 30 Units into the skin daily., Disp: , Rfl:  .  losartan (COZAAR) 50 MG tablet, Take 1 tablet (50 mg total) by mouth daily., Disp: 90 tablet, Rfl: 1 .  metFORMIN (GLUCOPHAGE) 500 MG tablet, Take 500 mg by mouth 2 (two) times daily. , Disp: , Rfl:  .  metoprolol tartrate (LOPRESSOR) 25 MG tablet, Take 1 tablet (25 mg total) by mouth 2 (two) times daily., Disp: 180 tablet, Rfl: 1 .  nitroGLYCERIN (NITROSTAT) 0.4 MG SL tablet, Place 1 tablet (0.4 mg total) under the tongue every 5 (five) minutes x 3 doses as needed for chest pain., Disp: 25 tablet, Rfl: 3 .  ticagrelor (BRILINTA) 90 MG TABS tablet, Take 1 tablet (90 mg total) by mouth 2 (two) times daily., Disp: 180 tablet, Rfl: 3 No current facility-administered medications for this visit.   Facility-Administered Medications Ordered in Other Visits:  .  STUDY - AEGIS II - placebo or CSL112 (PI-Hilty), 170 mL, Intravenous, Once, Hilty, Nadean Corwin, MD, Last Rate: 85 mL/hr at 12/26/18 1200, 170 mL at 12/26/18 1200

## 2018-12-29 ENCOUNTER — Encounter: Payer: 59 | Admitting: Gastroenterology

## 2019-01-02 ENCOUNTER — Encounter: Payer: BC Managed Care – PPO | Admitting: *Deleted

## 2019-01-02 ENCOUNTER — Other Ambulatory Visit: Payer: Self-pay

## 2019-01-02 ENCOUNTER — Ambulatory Visit (INDEPENDENT_AMBULATORY_CARE_PROVIDER_SITE_OTHER): Payer: BC Managed Care – PPO | Admitting: Cardiology

## 2019-01-02 ENCOUNTER — Encounter: Payer: Self-pay | Admitting: Cardiology

## 2019-01-02 ENCOUNTER — Encounter (HOSPITAL_COMMUNITY)
Admission: RE | Admit: 2019-01-02 | Discharge: 2019-01-02 | Disposition: A | Discharge status: Home / Self Care | Payer: BC Managed Care – PPO | Source: Ambulatory Visit | Attending: Internal Medicine | Admitting: Internal Medicine

## 2019-01-02 DIAGNOSIS — I214 Non-ST elevation (NSTEMI) myocardial infarction: Secondary | ICD-10-CM

## 2019-01-02 DIAGNOSIS — I10 Essential (primary) hypertension: Secondary | ICD-10-CM

## 2019-01-02 DIAGNOSIS — E669 Obesity, unspecified: Secondary | ICD-10-CM

## 2019-01-02 DIAGNOSIS — R42 Dizziness and giddiness: Secondary | ICD-10-CM

## 2019-01-02 DIAGNOSIS — E118 Type 2 diabetes mellitus with unspecified complications: Secondary | ICD-10-CM

## 2019-01-02 DIAGNOSIS — Z006 Encounter for examination for normal comparison and control in clinical research program: Secondary | ICD-10-CM

## 2019-01-02 DIAGNOSIS — Z9861 Coronary angioplasty status: Secondary | ICD-10-CM

## 2019-01-02 DIAGNOSIS — I2510 Atherosclerotic heart disease of native coronary artery without angina pectoris: Secondary | ICD-10-CM

## 2019-01-02 DIAGNOSIS — E785 Hyperlipidemia, unspecified: Secondary | ICD-10-CM

## 2019-01-02 MED ORDER — STUDY - AEGIS II STUDY - PLACEBO OR CSL112 (PI-HILTY)
170.0000 mL | Freq: Once | INTRAVENOUS | Status: DC
  Filled 2019-01-02: qty 170

## 2019-01-02 NOTE — Assessment & Plan Note (Signed)
On high dose statin Rx- check lipids and CMET 2 months

## 2019-01-02 NOTE — Assessment & Plan Note (Signed)
Repeat B/P by me with large cuff- 104/68 laying,  80 systolic sitting

## 2019-01-02 NOTE — Research (Signed)
Patient here for AEGIS II research study infusion # 2. IV was already started by staff. Patient C/o Dizziness on and off since being discharged. Loss of appetite and diarrhea. He states he has not had Diarrhea in 2 days. He denies any other symptoms. Dr. Lia Foyer informed and decision made to not give 2nd Infusion and to [facility] (pt has 2 pm appointment today). Instructed patient if he was feeling better tomorrow or later this week we could potentially give the infusion. Also informed patient that Dr. Lia Foyer did not think it was related to infusion but wanted to be safe and not give.

## 2019-01-02 NOTE — Patient Instructions (Addendum)
Medication Instructions:  STOP Losartan  *If you need a refill on your cardiac medications before your next appointment, please call your pharmacy*  Lab Work: Your physician recommends that you return for lab work in: TODAY-BMET, CBC If you have labs (blood work) drawn today and your tests are completely normal, you will receive your results only by: Marland Kitchen MyChart Message (if you have MyChart) OR . A paper copy in the mail If you have any lab test that is abnormal or we need to change your treatment, we will call you to review the results.  Testing/Procedures: NONE   Follow-Up: At [facility], you and your health needs are our priority.  As part of our continuing mission to provide you with exceptional heart care, we have created designated Provider Care Teams.  These Care Teams include your primary Cardiologist (physician) and Advanced Practice Providers (APPs -  Physician Assistants and Nurse Practitioners) who all work together to provide you with the care you need, when you need it.  Your next appointment:   1 WEEK  The format for your next appointment:   Virtual Visit   Provider:   Kerin Ransom, PA-C   Your next appointment:    2-3 MONTHS   The format for your next appointment:   In Person  Provider:   Shelva Majestic, MD   Other Instructions

## 2019-01-02 NOTE — Progress Notes (Signed)
Cardiology Office Note:    Date:  01/02/2019   ID:  Keith Newton, DOB [birth year], MRN [id number]  PCP:  Haywood Pao, MD  Cardiologist:  Shelva Majestic, MD  Electrophysiologist:  None   Referring MD: Seward Carol, MD   CC: position dizziness   History of Present Illness:    Keith Newton is an obese 63 y.o.AA male with a hx of hypertension, non-insulin-dependent diabetes, and a past evaluation for sinus tachycardia.  Patient was admitted 12/23/2018 with nausea vomiting chest pain and shortness of breath.  He was markedly hypertensive in the emergency room with a blood pressure of 180/114.  High-sensitivity troponin was greater than 27,000.  He was taken to the Cath Lab where he had an intervention to 2 sites in the RCA.  He tolerated this well.  He does have residual 40% LAD and 50% circumflex.  Ejection fraction was 55-60% with basilar hypokinesis by echo.  He was discharged 12/25/2018.  He was enrolled in the Aegis II study.  He was receiving his second infusion and complained of dizziness.  Was decided to hold off on that infusion.  He seen in the office today for follow-up.  Since discharge the patient complains of dizziness.  This is better when he lays down and worse when he gets up.  He denies any further nausea and vomiting.  He has had a headache.  He has not had syncope.  Past Medical History:  Diagnosis Date  . Arthritis   . Back pain   . CAD (coronary artery disease)    a. s/p NSTEMI in 12/2018 with 80% Prox-RCA stenosis followed by 100% prox to mid-RCA stenosis --> DESx2 to the RCA. Med management of residual disease.   . Carpal tunnel syndrome   . DDD (degenerative disc disease)   . Diabetes mellitus   . Hyperlipidemia   . Hypertension    states very mild  . Low testosterone   . Overweight   . Seasonal allergies   . Tachycardia     Past Surgical History:  Procedure Laterality Date  . CARPAL TUNNEL RELEASE  07/02/2011   Procedure: CARPAL TUNNEL RELEASE;   Surgeon: Cammie Sickle., MD;  Location: [place];  Service: Orthopedics;  Laterality: Right;  . CARPAL TUNNEL RELEASE  07/30/2011   Procedure: CARPAL TUNNEL RELEASE;  Surgeon: Cammie Sickle., MD;  Location: [place];  Service: Orthopedics;  Laterality: Left;  . CORONARY STENT INTERVENTION N/A 12/23/2018   Procedure: CORONARY STENT INTERVENTION;  Surgeon: Jettie Booze, MD;  Location: [place] [facility];  Service: Cardiovascular;  Laterality: N/A;  . LEFT HEART CATH AND CORONARY ANGIOGRAPHY N/A 12/23/2018   Procedure: LEFT HEART CATH AND CORONARY ANGIOGRAPHY;  Surgeon: Jettie Booze, MD;  Location: [place] [facility];  Service: Cardiovascular;  Laterality: N/A;  . LUMBAR EPIDURAL INJECTION     x2    Current Medications: Current Meds  Medication Sig  . aspirin EC 81 MG EC tablet Take 1 tablet (81 mg total) by mouth daily.  Marland Kitchen atorvastatin (LIPITOR) 80 MG tablet Take 1 tablet (80 mg total) by mouth daily.  . insulin degludec (TRESIBA FLEXTOUCH) 100 UNIT/ML SOPN FlexTouch Pen Inject 30 Units into the skin daily.  . metFORMIN (GLUCOPHAGE) 500 MG tablet Take 500 mg by mouth 2 (two) times daily.   . metoprolol tartrate (LOPRESSOR) 25 MG tablet Take 1 tablet (25 mg total) by mouth 2 (two) times daily.  . nitroGLYCERIN (NITROSTAT)  0.4 MG SL tablet Place 1 tablet (0.4 mg total) under the tongue every 5 (five) minutes x 3 doses as needed for chest pain.  . ticagrelor (BRILINTA) 90 MG TABS tablet Take 1 tablet (90 mg total) by mouth 2 (two) times daily.  . [DISCONTINUED] losartan (COZAAR) 50 MG tablet Take 1 tablet (50 mg total) by mouth daily.     Allergies:   Codeine   Social History   Socioeconomic History  . Marital status: Married    Spouse name: Not on file  . Number of children: Not on file  . Years of education: Not on file  . Highest education level: Not on file  Occupational History  . Not on file  Social Needs  . Financial  resource strain: Not on file  . Food insecurity    Worry: Not on file    Inability: Not on file  . Transportation needs    Medical: Not on file    Non-medical: Not on file  Tobacco Use  . Smoking status: Never Smoker  . Smokeless tobacco: Never Used  Substance and Sexual Activity  . Alcohol use: No  . Drug use: No  . Sexual activity: Not on file  Lifestyle  . Physical activity    Days per week: Not on file    Minutes per session: Not on file  . Stress: Not on file  Relationships  . Social Musician on phone: Not on file    Gets together: Not on file    Attends religious service: Not on file    Active member of club or organization: Not on file    Attends meetings of clubs or organizations: Not on file    Relationship status: Not on file  Other Topics Concern  . Not on file  Social History Narrative  . Not on file     Family History: The patient's family history includes CAD in his father, maternal grandfather, and paternal grandfather; Stroke in his mother.  ROS:   Please see the history of present illness.    All other systems reviewed and are negative.  EKGs/Labs/Other Studies Reviewed:    The following studies were reviewed today: Cath/ PCI 12/23/2018 Echo 12/23/2018  EKG:  EKG is ordered today.  The ekg ordered today demonstrates NSR, inferior TWI  Recent Labs: 12/23/2018: TSH 0.418 12/24/2018: Hemoglobin 13.9; Platelets 275 12/25/2018: ALT 25; BUN 11; Creatinine, Ser 1.21; Potassium 3.8; Sodium 137  Recent Lipid Panel    Component Value Date/Time   CHOL 191 12/24/2018 0214   TRIG 124 12/24/2018 0214   HDL 29 (L) 12/24/2018 0214   CHOLHDL 6.6 12/24/2018 0214   VLDL 25 12/24/2018 0214   LDLCALC 137 (H) 12/24/2018 0214    Physical Exam:    VS:  BP 119/79   Pulse 91   Ht 5\' 8"  (1.727 m)   Wt 247 lb (112 kg)   SpO2 100%   BMI 37.56 kg/m     Wt Readings from Last 3 Encounters:  01/02/19 247 lb (112 kg)  01/02/19 255 lb (115.7 kg)   12/23/18 255 lb (115.7 kg)     GEN:  Obese AA male, well developed in no acute distress HEENT: Normal NECK: No JVD; No carotid bruits LYMPHATICS: No lymphadenopathy CARDIAC: RRR, no murmurs, rubs, gallops RESPIRATORY:  Clear to auscultation without rales, wheezing or rhonchi  ABDOMEN: Soft, non-tender, non-distended MUSCULOSKELETAL:  No edema; No deformity  SKIN: Warm and dry NEUROLOGIC:  Alert and  oriented x 3 PSYCHIATRIC:  Normal affect   ASSESSMENT:    NSTEMI (non-ST elevated myocardial infarction) (HCC) S/P inferior NSTEMI 12/23/2018  CAD -S/P PCI S/P RCA PCI with DES x 2 12/23/2018 Residual 40% LAD, 50% CFX- EF 55%  Type 2 diabetes mellitus with complication (HCC) Pt c/p neuropathy in his feet  Hypertension HTN on admission- now hypotensive and symptomatic Stop Losartan, home hydration, check labs  Hyperlipidemia LDL goal <70 On high dose statin Rx- check lipids and CMET 2 months  Obesity (BMI 30-39.9) BMI 38  Orthostatic dizziness Repeat B/P by me with large cuff- 104/68 laying,  80 systolic sitting  PLAN:    Stop losartan- hydrate at home- check CBC and BMP today (his SCr was drifting up at discharge). Virtual follow up in one week- Dr Tresa EndoKelly in 2 months.    Medication Adjustments/Labs and Tests Ordered: Current medicines are reviewed at length with the patient today.  Concerns regarding medicines are outlined above.  Orders Placed This Encounter  Procedures  . Basic Metabolic Panel (BMET)  . CBC  . EKG 12-Lead   No orders of the defined types were placed in this encounter.   Patient Instructions  Medication Instructions:  STOP Losartan  *If you need a refill on your cardiac medications before your next appointment, please call your pharmacy*  Lab Work: Your physician recommends that you return for lab work in: TODAY-BMET, CBC If you have labs (blood work) drawn today and your tests are completely normal, you will receive your results only  by: Marland Kitchen. MyChart Message (if you have MyChart) OR . A paper copy in the mail If you have any lab test that is abnormal or we need to change your treatment, we will call you to review the results.  Testing/Procedures: NONE   Follow-Up: At [facility], you and your health needs are our priority.  As part of our continuing mission to provide you with exceptional heart care, we have created designated Provider Care Teams.  These Care Teams include your primary Cardiologist (physician) and Advanced Practice Providers (APPs -  Physician Assistants and Nurse Practitioners) who all work together to provide you with the care you need, when you need it.  Your next appointment:   1 WEEK  The format for your next appointment:   Virtual Visit   Provider:   Corine ShelterLuke Cleotis Sparr, PA-C   Your next appointment:    2-3 MONTHS   The format for your next appointment:   In Person  Provider:   Nicki Guadalajarahomas Kelly, MD   Other Instructions     Signed, Corine ShelterLuke Dierdra Salameh, PA-C  01/02/2019 3:11 PM    [facility]

## 2019-01-02 NOTE — Assessment & Plan Note (Signed)
Pt c/p neuropathy in his feet

## 2019-01-02 NOTE — Assessment & Plan Note (Signed)
HTN on admission- now hypotensive and symptomatic Stop Losartan, home hydration, check labs

## 2019-01-02 NOTE — Assessment & Plan Note (Signed)
S/P RCA PCI with DES x 2 12/23/2018 Residual 40% LAD, 50% CFX- EF 55% 

## 2019-01-02 NOTE — Assessment & Plan Note (Signed)
BMI 38 

## 2019-01-02 NOTE — Assessment & Plan Note (Signed)
S/P inferior NSTEMI 12/23/2018 

## 2019-01-03 LAB — BASIC METABOLIC PANEL
BUN/Creatinine Ratio: 18 (ref 10–24)
BUN: 22 mg/dL (ref 8–27)
CO2: 20 mmol/L (ref 20–29)
Calcium: 10.4 mg/dL — ABNORMAL HIGH (ref 8.6–10.2)
Chloride: 100 mmol/L (ref 96–106)
Creatinine, Ser: 1.21 mg/dL (ref 0.76–1.27)
GFR calc Af Amer: 74 mL/min/{1.73_m2} (ref >59)
GFR calc non Af Amer: 64 mL/min/{1.73_m2} (ref >59)
Glucose: 191 mg/dL — ABNORMAL HIGH (ref 65–99)
Potassium: 4.4 mmol/L (ref 3.5–5.2)
Sodium: 138 mmol/L (ref 134–144)

## 2019-01-03 NOTE — Research (Signed)
V2 PK ACCESSION NO. [id number]                                             Page 1 of 1                                                        INVESTIGATOR: ([id number])                           PROTOCOL   [id number]                     [name], M.D.                            INVESTIGATOR NO.: [id number]                     c/o [employer]                         SUBJECT NUMBER: [id number]                     Keith Newton. [facility].                   SUBJECT INITIALS NOT COLLECTED:                      [address]                                VISIT: V2PKPD                     [place], [place] [address]PK/PD Visit 2                   SPONSOR REPORT TO:                 COLLECTION TIME:11:50 DATE:26-Dec-2018                     Danielle Duffy                   DATE RECEIVED IN LABORATORY: 31-Dec-2018                     c/o Sponsor e[employer] Access         DATE REPORTED BY LABORATORY: 31-Dec-2018                     [place]                          SEX: M  BIRTHDATE:  17-Feb-1955    AGE: 26D                     [id number]                    [place], [place]  [id number] FORMAT: HC [id number]                                                                     Ref. Ranges               Clinical    Comments                                                                          Significance                                                                            Yes* No                    COLLECTION D&T SM PK SOI                      Col D SOI      26-Dec-2018                                               Col T SOI      11:50                                                   COLLECTION D&T SM PK EOI                      Col D EOI      26-Dec-2018                                               Col T EOI      13:58

## 2019-01-06 NOTE — Research (Signed)
Unscheduled Visit:  V3 not completed today:  Patient here for infusion 2, infusion 2 was not given due to patient told Tammy that he had some diarrhea and dizziness after his last infusion and loss of appetite. See note below from [place].  Signed         Patient here for AEGIS II research study infusion # 2. IV was already started by staff. Patient C/o Dizziness on and off since being discharged. Loss of appetite and diarrhea. He states he has not had Diarrhea in 2 days. He denies any other symptoms. Dr. Lia Foyer informed and decision made to not give 2nd Infusion and to [facility] (pt has 2 pm appointment today). Instructed patient if he was feeling better tomorrow or later this week we could potentially give the infusion. Also informed patient that Dr. Lia Foyer did not think it was related to infusion but wanted to be safe and not give.         Electronically signed by Lafe Garin, RN at 01/02/2019 11:31 AM  Told patient to call back if he felt better so we could infuse the second dose and get him back on track. Will follow up with patient thru out to see if he could come back in. Patient has appt with Cardiology this afternoon.                                    "CONSENT"   YES     NO   Continuing further Investigational Product and study visits for follow-up? [x]  []   Continuing consent from future biomedical research [x]  []                                   "EVENTS"    YES     NO  AE   (IF YES SEE SOURCE) [x]  []   SAE  (IF YES SEE SOURCE) []  [x]   ENDPOINT   (IF YES SEE SOURCE) []  [x]   REVASCULARIZATION  (IF YES SEE SOURCE) []  [x]   AMPUTATION   (IF YES SEE SOURCE) []  [x]   TROPONIN'S  (IF YES SEE SOURCE) []  [x] 

## 2019-01-09 ENCOUNTER — Other Ambulatory Visit: Payer: Self-pay

## 2019-01-09 ENCOUNTER — Encounter: Payer: BC Managed Care – PPO | Admitting: *Deleted

## 2019-01-09 ENCOUNTER — Ambulatory Visit (HOSPITAL_COMMUNITY)
Admission: RE | Admit: 2019-01-09 | Discharge: 2019-01-09 | Disposition: A | Discharge status: Home / Self Care | Payer: Self-pay | Source: Ambulatory Visit | Attending: Internal Medicine | Admitting: Internal Medicine

## 2019-01-09 ENCOUNTER — Telehealth (HOSPITAL_COMMUNITY): Payer: Self-pay | Admitting: Licensed Clinical Social Worker

## 2019-01-09 VITALS — BP 125/85 | HR 81

## 2019-01-09 DIAGNOSIS — Z006 Encounter for examination for normal comparison and control in clinical research program: Secondary | ICD-10-CM | POA: Insufficient documentation

## 2019-01-09 MED ORDER — STUDY - AEGIS II STUDY - PLACEBO OR CSL112 (PI-HILTY)
170.0000 mL | Freq: Once | INTRAVENOUS | Status: AC
  Administered 2019-01-09: 170 mL via INTRAVENOUS
  Filled 2019-01-09: qty 170

## 2019-01-09 NOTE — Telephone Encounter (Signed)
CSW received referral from research to assist with insurance. CSW contacted patient's daughter and left message for return call. Raquel Sarna, [place], [place]

## 2019-01-10 ENCOUNTER — Telehealth (HOSPITAL_COMMUNITY): Payer: Self-pay | Admitting: Licensed Clinical Social Worker

## 2019-01-10 ENCOUNTER — Encounter: Payer: Self-pay | Admitting: Cardiology

## 2019-01-10 ENCOUNTER — Telehealth (INDEPENDENT_AMBULATORY_CARE_PROVIDER_SITE_OTHER): Payer: Self-pay | Admitting: Cardiology

## 2019-01-10 ENCOUNTER — Telehealth: Payer: Self-pay

## 2019-01-10 VITALS — Ht 68.0 in | Wt 247.0 lb

## 2019-01-10 DIAGNOSIS — Z794 Long term (current) use of insulin: Secondary | ICD-10-CM

## 2019-01-10 DIAGNOSIS — Z955 Presence of coronary angioplasty implant and graft: Secondary | ICD-10-CM

## 2019-01-10 DIAGNOSIS — E119 Type 2 diabetes mellitus without complications: Secondary | ICD-10-CM

## 2019-01-10 DIAGNOSIS — E118 Type 2 diabetes mellitus with unspecified complications: Secondary | ICD-10-CM

## 2019-01-10 DIAGNOSIS — E785 Hyperlipidemia, unspecified: Secondary | ICD-10-CM

## 2019-01-10 DIAGNOSIS — Z6837 Body mass index (BMI) 37.0-37.9, adult: Secondary | ICD-10-CM

## 2019-01-10 DIAGNOSIS — I2510 Atherosclerotic heart disease of native coronary artery without angina pectoris: Secondary | ICD-10-CM

## 2019-01-10 DIAGNOSIS — I252 Old myocardial infarction: Secondary | ICD-10-CM

## 2019-01-10 DIAGNOSIS — R42 Dizziness and giddiness: Secondary | ICD-10-CM

## 2019-01-10 DIAGNOSIS — E1140 Type 2 diabetes mellitus with diabetic neuropathy, unspecified: Secondary | ICD-10-CM

## 2019-01-10 DIAGNOSIS — I10 Essential (primary) hypertension: Secondary | ICD-10-CM

## 2019-01-10 DIAGNOSIS — I214 Non-ST elevation (NSTEMI) myocardial infarction: Secondary | ICD-10-CM

## 2019-01-10 DIAGNOSIS — E669 Obesity, unspecified: Secondary | ICD-10-CM

## 2019-01-10 DIAGNOSIS — Z9861 Coronary angioplasty status: Secondary | ICD-10-CM

## 2019-01-10 MED ORDER — ISOSORBIDE MONONITRATE ER 30 MG PO TB24: 30.0000 mg | ORAL_TABLET | Freq: Every day | ORAL | 3 refills | Status: DC

## 2019-01-10 NOTE — Telephone Encounter (Signed)
Contacted patient to discuss AVS Instructions. Gave patient Luke's recommendations from today's virtual office visit. Infomed patient to follow up as scheduled. Patient voiced understanding and AVS mailed.

## 2019-01-10 NOTE — Patient Instructions (Addendum)
Medication Instructions:  START Imdur 30mg  take 1 tablet once a day  TAKE your Brilinta with a half of something with Caffeinated such as tea or a Coke *If you need a refill on your cardiac medications before your next appointment, please call your pharmacy*  Lab Work: None  If you have labs (blood work) drawn today and your tests are completely normal, you will receive your results only by: Marland Kitchen MyChart Message (if you have MyChart) OR . A paper copy in the mail If you have any lab test that is abnormal or we need to change your treatment, we will call you to review the results.  Testing/Procedures: None   Follow-Up: At [facility], you and your health needs are our priority. As part of our continuing mission to provide you with exceptional heart care, we have created designated Provider Care Teams.  These Care Teams include your primary Cardiologist (physician) and Advanced Practice Providers (APPs -  Physician Assistants and Nurse Practitioners) who all work together to provide you with the care you need, when you need it.  Your next appointment:   FOLLOW UP AS SCHEDULED IN [place]  The format for your next appointment:   In Person  Provider:   Shelva Majestic, MD  Other Instructions

## 2019-01-10 NOTE — Progress Notes (Signed)
Virtual Visit via Telephone Note   This visit type was conducted due to national recommendations for restrictions regarding the COVID-19 Pandemic (e.g. social distancing) in an effort to limit this patient's exposure and mitigate transmission in our community.  Due to his co-morbid illnesses, this patient is at least at moderate risk for complications without adequate follow up.  This format is felt to be most appropriate for this patient at this time.  The patient did not have access to video technology/had technical difficulties with video requiring transitioning to audio format only (telephone).  All issues noted in this document were discussed and addressed.  No physical exam could be performed with this format.  Please refer to the patient's chart for his  consent to telehealth for [facility].   Date:  01/10/2019   ID:  [name], DOB 07-07-1955, MRN [id number]  Patient Location: Home Provider Location: Home  PCP:  Tisovec, Fransico Him, MD  Cardiologist:  Shelva Majestic, MD  Electrophysiologist:  None   Evaluation Performed:  Follow-Up Visit  Chief Complaint:  Orthostatic symptoms better off Losartan  History of Present Illness:    Keith Newton is a 63 y.o. male with a hx of hypertension, non-insulin-dependent diabetes, and a past evaluation for sinus tachycardia.  The patient was admitted 12/23/2018 with nausea vomiting chest pain and shortness of breath.  He was markedly hypertensive in the emergency room with a blood pressure of 180/114.  HS troponin was greater than 27,000.  He was taken to the Cath Lab where he had an intervention to 2 sites in the RCA.  He tolerated this well.  He does have residual 40% pLAD and 50% mCFX, and 75% PDA.  Ejection fraction was 55-60% with basilar hypokinesis by echo.  He was discharged 12/25/2018.  He was enrolled in the Aegis II study.   I saw him in the office 01/02/2019 with complaints of orthostatic dizziness.  His B/P was 80 systolic  standing. Labs showed no acute findings.  I stopped his Losartan and he was contacted today for follow up. His orthostatic dizziness has resolved.  He has multiple other concerns though- he describes numbness and tingling in his feet at night-(neuropathy)  He notices a sensation of SOB at times that does not last (probably secondary to Brilinta), and intermittent SSCP-"3/10" not exertional but similar to his admission symptoms (possibly secondary to residual small vessel CAD).   The patient does not have symptoms concerning for COVID-19 infection (fever, chills, cough, or new shortness of breath).    Past Medical History:  Diagnosis Date  . Arthritis   . Back pain   . CAD (coronary artery disease)    a. s/p NSTEMI in 12/2018 with 80% Prox-RCA stenosis followed by 100% prox to mid-RCA stenosis --> DESx2 to the RCA. Med management of residual disease.   . Carpal tunnel syndrome   . DDD (degenerative disc disease)   . Diabetes mellitus   . Hyperlipidemia   . Hypertension    states very mild  . Low testosterone   . Overweight   . Seasonal allergies   . Tachycardia    Past Surgical History:  Procedure Laterality Date  . CARPAL TUNNEL RELEASE  07/02/2011   Procedure: CARPAL TUNNEL RELEASE;  Surgeon: Cammie Sickle., MD;  Location: [facility];  Service: Orthopedics;  Laterality: Right;  . CARPAL TUNNEL RELEASE  07/30/2011   Procedure: CARPAL TUNNEL RELEASE;  Surgeon: Cammie Sickle., MD;  Location: [facility];  Service: Orthopedics;  Laterality: Left;  . CORONARY STENT INTERVENTION N/A 12/23/2018   Procedure: CORONARY STENT INTERVENTION;  Surgeon: Corky CraftsVaranasi, Jayadeep S, MD;  [facility];  Service: Cardiovascular;  Laterality: N/A;  . LEFT HEART CATH AND CORONARY ANGIOGRAPHY N/A 12/23/2018   Procedure: LEFT HEART CATH AND CORONARY ANGIOGRAPHY;  Surgeon: Corky CraftsVaranasi, Jayadeep S, MD;  [facility];  Service: Cardiovascular;   Laterality: N/A;  . LUMBAR EPIDURAL INJECTION     x2     Current Meds  Medication Sig  . aspirin EC 81 MG EC tablet Take 1 tablet (81 mg total) by mouth daily.  Marland Kitchen. atorvastatin (LIPITOR) 80 MG tablet Take 1 tablet (80 mg total) by mouth daily.  . insulin degludec (TRESIBA FLEXTOUCH) 100 UNIT/ML SOPN FlexTouch Pen Inject 30 Units into the skin daily.  . metFORMIN (GLUCOPHAGE) 500 MG tablet Take 500 mg by mouth 2 (two) times daily.   . metoprolol tartrate (LOPRESSOR) 25 MG tablet Take 1 tablet (25 mg total) by mouth 2 (two) times daily.  . nitroGLYCERIN (NITROSTAT) 0.4 MG SL tablet Place 1 tablet (0.4 mg total) under the tongue every 5 (five) minutes x 3 doses as needed for chest pain.  . ticagrelor (BRILINTA) 90 MG TABS tablet Take 1 tablet (90 mg total) by mouth 2 (two) times daily.     Allergies:   Codeine   Social History   Tobacco Use  . Smoking status: Never Smoker  . Smokeless tobacco: Never Used  Substance Use Topics  . Alcohol use: No  . Drug use: No     Family Hx: The patient's family history includes CAD in his father, maternal grandfather, and paternal grandfather; Stroke in his mother.  ROS:   Please see the history of present illness.    All other systems reviewed and are negative.   Prior CV studies:   The following studies were reviewed today: Cath/ PCI 12/23/2018  Labs/Other Tests and Data Reviewed:    EKG:  No ECG reviewed.  Recent Labs: 12/23/2018: TSH 0.418 12/24/2018: Hemoglobin 13.9; Platelets 275 12/25/2018: ALT 25 01/02/2019: BUN 22; Creatinine, Ser 1.21; Potassium 4.4; Sodium 138   Recent Lipid Panel Lab Results  Component Value Date/Time   CHOL 191 12/24/2018 02:14 AM   TRIG 124 12/24/2018 02:14 AM   HDL 29 (L) 12/24/2018 02:14 AM   CHOLHDL 6.6 12/24/2018 02:14 AM   LDLCALC 137 (H) 12/24/2018 02:14 AM    Wt Readings from Last 3 Encounters:  01/10/19 247 lb (112 kg)  01/09/19 247 lb (112 kg)  01/02/19 255 lb (115.7 kg)      Objective:    Vital Signs:  Ht 5\' 8"  (1.727 m)   Wt 247 lb (112 kg)   BMI 37.56 kg/m    VITAL SIGNS:  reviewed  ASSESSMENT & PLAN:    NSTEMI (non-ST elevated myocardial infarction) (HCC) S/P inferior NSTEMI 12/23/2018  CAD -S/P PCI S/P RCA PCI with DES x 2 12/23/2018 Residual 40% LAD, 50% CFX, 75% PDA- EF 55%. Some chest pain-? Secondary to PDA/ small vessel CAD.  Add low dose Imdur  Type 2 diabetes mellitus with complication (HCC) Pt c/p neuropathy in his feet  Hypertension HTN on admission- then hypotensive and symptomatic in the office. Symptoms resolved off Losartan  Hyperlipidemia LDL goal <70 On high dose statin Rx- check lipids and CMET 2 months  Obesity (BMI 30-39.9) BMI 38  Dyspnea- Probably secondary to Brilinta.  I asked him to  take this with a small amount of caffeine.   COVID-19 Education: The signs and symptoms of COVID-19 were discussed with the patient and how to seek care for testing (follow up with PCP or arrange E-visit).  The importance of social distancing was discussed today.  Time:   Today, I have spent 20 minutes with the patient with telehealth technology discussing the above problems.     Medication Adjustments/Labs and Tests Ordered: Current medicines are reviewed at length with the patient today.  Concerns regarding medicines are outlined above.   Tests Ordered: No orders of the defined types were placed in this encounter.   Medication Changes: No orders of the defined types were placed in this encounter.   Follow Up:  In Person Dr Tresa Endo in [place] as scheduled.  Add Imdur 30 mg daily.   Jolene Provost, PA-C  01/10/2019 10:44 AM    [facility]

## 2019-01-10 NOTE — Telephone Encounter (Signed)

## 2019-01-10 NOTE — Telephone Encounter (Signed)
CSW left message for patient's daughter to return call if questions regarding patient;s insurance. Message left. Raquel Sarna, [place], [place]

## 2019-01-11 NOTE — Research (Signed)
DEMOGRAPHICS:  Patient Name: Keith Newton Birth Date: May 02, 1955  Sex: Male  Race: African American   Child Bearing: ? Yes    ? No ? Tubial ligation ? Hysterectomy  ? postmenopausal   Height:  cm Weight: 84 kg   Index Procedure:  Onset date of symptoms: 5-Nov-20 Onset of symptoms: 10:30  Date of First contact at hospital: 6-Nov-20 Time of first contact at hospital: 09:17  Admission Date: 6-Nov-20   Discharge Date: 8-Nov-20 Discharge Time: 1245   Vital Signs: Date 12/23/2018    Time: 09:07 BP: 181/114  Pulse: 110    Concomitant medications: Every visit: ? See med sheet  BMP Pre Contrast IV 12/23/18 @ 09:38  1.12  CMP Post Contrast IV 12 hours later: 12/25/2018 @ 08:08 1.21 Hepatic Panel: 12/25/18 @ 0808 ALT: 25 Total Bili: 1.0 Direct Bili: ND   Medical History:  ? CAD ? Prior MI ? PAD  ? History of Heart Failure ? Moderate to severe valvular dx ? AFib  ? Prior Coronary Revascularization  if YES please select Yes or No below:  CABG ? Yes   ? No           PCI with stent ? Yes   ? No          PCI without stent ? Yes ? No  ? CVA if checked please select one of the following Choose an item.  ? Hypertension  ? Gilberts syndrome ? CKD   ? Hypocholesteremia ? DM ? Smoker        ? eCigarette  Killip Class Stage 1     EQ-5D-3L ?  Future Biomedical Research: Consented ? Yes     ? No If no please date they withdrew consent from biomedical research Click or tap to enter a date.  Central Labs Before Start of Infusion: ? Biochemistry panel      ? Hematology     ? Immunogenicity   (30 mins before infusion)   ? Parvovirus  ? FBR sample ? PK/PD sample Central Labs End of Infusion:   ? PK/PD Central Blood Draw Time: Before SOI:  _11/09/20 @ 1150_ After EOI: _11/09/20 @ 1358__ Infusion Start Time: 12/26/2018 12:00 PM  Infusion End Time: 12/26/2018 1:55 PM   EQ-5D-5L  MOBILITY:    I HAVE NO PROBLEMS WALKING [x]   I HAVE SLIGHT PROBLEMS WALKING []   I HAVE MODERATE PROBLEMS WALKING []   I HAVE  SEVERE PROBLEMS WALKING []   I AM UNABLE TO WALK  []     SELF-CARE:   I HAVE NO PROBLEMS WASING OR DRESSING MYSELF  [x]   I HAVE SLIGHT PROBLEMS WASHING OR DRESSING MYSELF  []   I HAVE MODERATE PROBLEMS WASHING OR DRESSING MYSELF []   I HAVE SEVERE PROBLEMS WASHING OR DRESSING MYSELF  []   I HAVE SEVERE PROBLEMS WASHING OR DRESSING MYSELF  []   I AM UNABLE TO WASH OR DRESS MYSELF []     USUAL ACTIVITIES: (E.G. WORK/STUDY/HOUSEWORK/FAMILY OR LEISURE ACTIVITIES.    I HAVE NO PROBLEMS DOING MY USUAL ACTIVITIES [x]   I HAVE SLIGHT PROBLEMS DOING MY USUAL ACTIVITIES []   I HAVE MODERATE PROBLEMS DOING MY USUAL ACTIVIITIES []   I HAVE SEVERE PROBLEMS DOING MY USUAL ACTIVITIES []   I AM UNABLE TO DO MY USUAL ACTIVITIES []     PAIN /DISCOMFORT   I HAVE NO PAIN OR DISCOMFORT [x]   I HAVE SLIGHT PAIN OR DISCOMFORT []   I HAVE MODERATE PAIN OR DISCOMFORT []   I HAVE SEVERE PAIN OR DISCOMFORT []   I HAVE EXTREME PAIN OR DISCOMFORT []     ANXIETY/DEPRESSION   I AM NOT ANXIOUS OR DEPRESSED [x]   I AM SLIGHTLY ANXIOUS OR DEPRESSED []   I AM MODERATELY ANXIOUS OR DREPRESSED []   I AM SEVERELY ANXIOUS OR DEPRESSED []   I AM EXTREMELY ANXIOUS OR DEPRESSED []     SCALE OF 0-100 HOW WOULD YOU RATE TODAY?  0 IS THE WORSE AND 100 IS THE BEST HEALTH YOU CAN IMAGINE: 80

## 2019-01-16 ENCOUNTER — Other Ambulatory Visit: Payer: Self-pay

## 2019-01-16 ENCOUNTER — Encounter: Payer: Self-pay | Admitting: *Deleted

## 2019-01-16 ENCOUNTER — Ambulatory Visit (HOSPITAL_COMMUNITY)
Admission: RE | Admit: 2019-01-16 | Discharge: 2019-01-16 | Disposition: A | Discharge status: Home / Self Care | Payer: Self-pay | Source: Ambulatory Visit | Attending: Internal Medicine | Admitting: Internal Medicine

## 2019-01-16 ENCOUNTER — Telehealth (HOSPITAL_COMMUNITY): Payer: Self-pay

## 2019-01-16 VITALS — BP 130/96 | HR 100

## 2019-01-16 DIAGNOSIS — Z006 Encounter for examination for normal comparison and control in clinical research program: Secondary | ICD-10-CM

## 2019-01-16 MED ORDER — STUDY - AEGIS II STUDY - PLACEBO OR CSL112 (PI-HILTY)
170.0000 mL | Freq: Once | INTRAVENOUS | Status: AC
  Administered 2019-01-16: 170 mL via INTRAVENOUS
  Filled 2019-01-16: qty 170

## 2019-01-16 NOTE — Telephone Encounter (Signed)
Called and spoke with pt in regards to CR, pt stated he was driving and on the way to a doctors appt. He was unable to provide update insurance information and for me to contact his daughter. Attempted to contact pt daughter, LMTCB.

## 2019-01-17 ENCOUNTER — Telehealth (HOSPITAL_COMMUNITY): Payer: Self-pay | Admitting: Pharmacist

## 2019-01-17 NOTE — Research (Signed)
V4 Patient doing well, no complaints of cp or sob.                                   "CONSENT"   YES     NO   Continuing further Investigational Product and study visits for follow-up? [x]  []   Continuing consent from future biomedical research [x]  []                                    "EVENTS"    YES     NO  AE   (IF YES SEE SOURCE) []  [x]   SAE  (IF YES SEE SOURCE) []  [x]   ENDPOINT   (IF YES SEE SOURCE) []  [x]   REVASCULARIZATION  (IF YES SEE SOURCE) []  [x]   AMPUTATION   (IF YES SEE SOURCE) []  [x]   TROPONIN'S  (IF YES SEE SOURCE) []  [x]     Current Outpatient Medications:  .  aspirin EC 81 MG EC tablet, Take 1 tablet (81 mg total) by mouth daily., Disp:  , Rfl:  .  atorvastatin (LIPITOR) 80 MG tablet, Take 1 tablet (80 mg total) by mouth daily., Disp: 90 tablet, Rfl: 1 .  insulin degludec (TRESIBA FLEXTOUCH) 100 UNIT/ML SOPN FlexTouch Pen, Inject 30 Units into the skin daily., Disp: , Rfl:  .  isosorbide mononitrate (IMDUR) 30 MG 24 hr tablet, Take 1 tablet (30 mg total) by mouth daily., Disp: 90 tablet, Rfl: 3 .  metFORMIN (GLUCOPHAGE) 500 MG tablet, Take 500 mg by mouth 2 (two) times daily. , Disp: , Rfl:  .  metoprolol tartrate (LOPRESSOR) 25 MG tablet, Take 1 tablet (25 mg total) by mouth 2 (two) times daily., Disp: 180 tablet, Rfl: 1 .  nitroGLYCERIN (NITROSTAT) 0.4 MG SL tablet, Place 1 tablet (0.4 mg total) under the tongue every 5 (five) minutes x 3 doses as needed for chest pain., Disp: 25 tablet, Rfl: 3 .  ticagrelor (BRILINTA) 90 MG TABS tablet, Take 1 tablet (90 mg total) by mouth 2 (two) times daily., Disp: 180 tablet, Rfl: 3

## 2019-01-18 NOTE — Telephone Encounter (Signed)
Cardiac Rehab Medication Review by a Pharmacist  Does the patient feel that his/her medications are working for him/her?  Yes  Has the patient been experiencing any side effects to the medications prescribed?  no   Does the patient measure his/her own blood pressure or blood glucose at home?  yes  Daughter cannot recall average BP and BG readings at home  Does the patient have any problems obtaining medications due to transportation or finances?   no  Understanding of regimen: excellent Understanding of indications: excellent Potential of compliance: excellent  Pharmacist comments: Medication reconciliation was conducted via speaking with his daughter, Lorene Samaan. She manages his medications.  Lorel Monaco, PharmD PGY1 Ambulatory Care Resident [employer] # [phone]

## 2019-01-23 ENCOUNTER — Other Ambulatory Visit: Payer: Self-pay

## 2019-01-23 ENCOUNTER — Ambulatory Visit (HOSPITAL_COMMUNITY)
Admission: RE | Admit: 2019-01-23 | Discharge: 2019-01-23 | Disposition: A | Discharge status: Home / Self Care | Payer: Self-pay | Source: Ambulatory Visit | Attending: Internal Medicine | Admitting: Internal Medicine

## 2019-01-23 ENCOUNTER — Encounter: Payer: Self-pay | Admitting: *Deleted

## 2019-01-23 VITALS — BP 124/84 | HR 111

## 2019-01-23 DIAGNOSIS — Z006 Encounter for examination for normal comparison and control in clinical research program: Secondary | ICD-10-CM

## 2019-01-23 MED ORDER — STUDY - AEGIS II STUDY - PLACEBO OR CSL112 (PI-HILTY)
170.0000 mL | Freq: Once | INTRAVENOUS | Status: AC
  Administered 2019-01-23: 170 mL via INTRAVENOUS
  Filled 2019-01-23: qty 170

## 2019-01-23 NOTE — Research (Signed)
V5   Patient doing well, no complaints of cp or sob.    Central labs drawn today.                                   "CONSENT"   YES     NO   Continuing further Investigational Product and study visits for follow-up? [x]  []   Continuing consent from future biomedical research [x]  []                                    "EVENTS"    YES     NO  AE   (IF YES SEE SOURCE) []  [x]   SAE  (IF YES SEE SOURCE) []  [x]   ENDPOINT   (IF YES SEE SOURCE) []  [x]   REVASCULARIZATION  (IF YES SEE SOURCE) []  [x]   AMPUTATION   (IF YES SEE SOURCE) []  [x]   TROPONIN'S  (IF YES SEE SOURCE) []  [x]     Current Outpatient Medications:  .  aspirin EC 81 MG EC tablet, Take 1 tablet (81 mg total) by mouth daily., Disp:  , Rfl:  .  atorvastatin (LIPITOR) 80 MG tablet, Take 1 tablet (80 mg total) by mouth daily., Disp: 90 tablet, Rfl: 1 .  gabapentin (NEURONTIN) 300 MG capsule, Take 300 mg by mouth at bedtime., Disp: , Rfl:  .  insulin degludec (TRESIBA FLEXTOUCH) 100 UNIT/ML SOPN FlexTouch Pen, Inject 30 Units into the skin daily., Disp: , Rfl:  .  isosorbide mononitrate (IMDUR) 30 MG 24 hr tablet, Take 1 tablet (30 mg total) by mouth daily., Disp: 90 tablet, Rfl: 3 .  metFORMIN (GLUCOPHAGE) 500 MG tablet, Take 500 mg by mouth 2 (two) times daily. , Disp: , Rfl:  .  metoprolol tartrate (LOPRESSOR) 25 MG tablet, Take 1 tablet (25 mg total) by mouth 2 (two) times daily., Disp: 180 tablet, Rfl: 1 .  nitroGLYCERIN (NITROSTAT) 0.4 MG SL tablet, Place 1 tablet (0.4 mg total) under the tongue every 5 (five) minutes x 3 doses as needed for chest pain. (Patient not taking: Reported on 01/18/2019), Disp: 25 tablet, Rfl: 3 .  ticagrelor (BRILINTA) 90 MG TABS tablet, Take 1 tablet (90 mg total) by mouth 2 (two) times daily., Disp: 180 tablet, Rfl: 3

## 2019-01-24 ENCOUNTER — Encounter: Payer: BC Managed Care – PPO | Admitting: Gastroenterology

## 2019-01-30 ENCOUNTER — Other Ambulatory Visit: Payer: Self-pay

## 2019-01-30 ENCOUNTER — Encounter: Payer: BC Managed Care – PPO | Admitting: *Deleted

## 2019-01-30 VITALS — BP 142/88 | HR 92 | Wt 244.8 lb

## 2019-01-30 DIAGNOSIS — Z006 Encounter for examination for normal comparison and control in clinical research program: Secondary | ICD-10-CM

## 2019-01-30 NOTE — Progress Notes (Signed)
AEGIS-II research study:  Patient seen an examined prior to randomization and infusion.  Physical Exam: General appearance: alert and cooperative Lungs: clear to auscultation bilaterally Heart: regular rate and rhythm, S1, S2 normal, no murmur, click, rub or gallop Abdomen: normal findings: obese Extremities: extremities normal, atraumatic, no cyanosis or edema Skin: Skin color, texture, turgor normal. No rashes or lesions  Killip Class:  Yes, Class I  The patient was given the opportunity to ask any further questions about the study that were not addressed by the research nurse coordinators - he has no further questions and wants to proceed at this time.   Encouraged patient to start cardiac rehab. States he has been complaint with his meds. Has been having intermittent episodes of shortness of breath, but suspect Brilinta related (noted in office notes as well). States he has not been checking his CBGs at home but has been taking meds. Advised to start checking CBGs daily as he is on insulin.   Barnet Pall, NP-C 01/30/2019, 11:49 AM Pager: [phone]

## 2019-01-31 NOTE — Research (Signed)
V6 AEGIS   Patient came in today for V6 follow up. Patient having some episodes of shortness of breath, but NP suspects related to Brilinta. (See NP note) patient will start cardiac rehab this week, encouraged him to participate.  Harlan Stains NP did physical exam                                    "CONSENT"   YES     NO   Continuing further Investigational Product and study visits for follow-up? [x]  []   Continuing consent from future biomedical research [x]  []                                      "EVENTS"    YES     NO  AE   (IF YES SEE SOURCE) []  [x]   SAE  (IF YES SEE SOURCE) []  [x]   ENDPOINT   (IF YES SEE SOURCE) []  [x]   REVASCULARIZATION  (IF YES SEE SOURCE) []  [x]   AMPUTATION   (IF YES SEE SOURCE) []  [x]   TROPONIN'S  (IF YES SEE SOURCE) []  [x]     Central Labs:       Current Outpatient Medications:  .  aspirin EC 81 MG EC tablet, Take 1 tablet (81 mg total) by mouth daily., Disp:  , Rfl:  .  atorvastatin (LIPITOR) 80 MG tablet, Take 1 tablet (80 mg total) by mouth daily., Disp: 90 tablet, Rfl: 1 .  FARXIGA 10 MG TABS tablet, Take 10 mg by mouth daily., Disp: , Rfl:  .  gabapentin (NEURONTIN) 300 MG capsule, Take 300 mg by mouth at bedtime., Disp: , Rfl:  .  insulin degludec (TRESIBA FLEXTOUCH) 100 UNIT/ML SOPN FlexTouch Pen, Inject 30 Units into the skin daily., Disp: , Rfl:  .  isosorbide mononitrate (IMDUR) 30 MG 24 hr tablet, Take 1 tablet (30 mg total) by mouth daily., Disp: 90 tablet, Rfl: 3 .  metFORMIN (GLUCOPHAGE) 500 MG tablet, Take 500 mg by mouth 2 (two) times daily. , Disp: , Rfl:  .  metoprolol tartrate (LOPRESSOR) 25 MG tablet, Take 1 tablet (25 mg total) by mouth 2 (two) times daily., Disp: 180 tablet, Rfl: 1 .  ticagrelor (BRILINTA) 90 MG TABS tablet, Take 1 tablet (90 mg total) by mouth 2 (two) times daily., Disp: 180 tablet, Rfl: 3 .  nitroGLYCERIN (NITROSTAT) 0.4 MG SL tablet, Place 1 tablet (0.4 mg total) under the tongue every 5 (five) minutes x 3  doses as needed for chest pain. (Patient not taking: Reported on 01/18/2019), Disp: 25 tablet, Rfl: 3

## 2019-02-02 ENCOUNTER — Encounter (HOSPITAL_COMMUNITY): Payer: Self-pay

## 2019-02-03 ENCOUNTER — Telehealth (HOSPITAL_COMMUNITY): Payer: Self-pay

## 2019-02-03 NOTE — Telephone Encounter (Signed)
Called to advise pt daughter that given the surge/increase in the Covid-19 cases and hospitalizations, our [place] senior leadership has asked [place] to halt onsite Cardiac and Pulmonary rehab exercise sessions and move patients to the Virtual app we have with [place]. The reason for this is so that department staff can be deployed to the inpatient nursing floors to assist those teams in need. Leadership anticipates this need will be 30-60 days however, it could be extended if needed. Advised pt's daughter he can attend the Orientation assessment on 02/14/2019@2 :00pm. At that time he will receive additional information about our [employer]. Pt daughter verbalized understanding and is in agreement.

## 2019-02-06 ENCOUNTER — Ambulatory Visit (HOSPITAL_COMMUNITY): Payer: Self-pay

## 2019-02-07 ENCOUNTER — Telehealth (HOSPITAL_COMMUNITY): Payer: Self-pay

## 2019-02-07 NOTE — Telephone Encounter (Signed)
Cardiac Rehab Medication Review by a Pharmacist  Does the patient  feel that his/her medications are working for him/her?  yes  Has the patient been experiencing any side effects to the medications prescribed?  no  Does the patient measure his/her own blood pressure or blood glucose at home?  no Daughter states that pt has supplies at home to measure BP and BG but does not do so regularly  Does the patient have any problems obtaining medications due to transportation or finances?   no  Understanding of regimen: excellent Understanding of indications: excellent Potential of compliance: excellent    Pharmacist comments: Spoke to daughter who manages his medications.    Berenice Bouton, PharmD PGY1 Pharmacy Resident Office phone: [phone] 02/07/2019 1:42 PM

## 2019-02-08 ENCOUNTER — Ambulatory Visit (HOSPITAL_COMMUNITY): Payer: Self-pay

## 2019-02-13 ENCOUNTER — Ambulatory Visit (HOSPITAL_COMMUNITY): Payer: Self-pay

## 2019-02-14 ENCOUNTER — Encounter (HOSPITAL_COMMUNITY)
Admission: RE | Admit: 2019-02-14 | Discharge: 2019-02-14 | Disposition: A | Discharge status: Home / Self Care | Payer: BC Managed Care – PPO | Source: Ambulatory Visit | Attending: Internal Medicine | Admitting: Internal Medicine

## 2019-02-14 ENCOUNTER — Other Ambulatory Visit: Payer: Self-pay

## 2019-02-14 ENCOUNTER — Encounter (HOSPITAL_COMMUNITY): Payer: Self-pay

## 2019-02-14 ENCOUNTER — Ambulatory Visit (HOSPITAL_COMMUNITY): Payer: BC Managed Care – PPO

## 2019-02-14 VITALS — BP 102/62 | HR 99 | Temp 97.9°F | Resp 18 | Ht 67.5 in | Wt 246.3 lb

## 2019-02-14 DIAGNOSIS — Z7902 Long term (current) use of antithrombotics/antiplatelets: Secondary | ICD-10-CM | POA: Insufficient documentation

## 2019-02-14 DIAGNOSIS — Z955 Presence of coronary angioplasty implant and graft: Secondary | ICD-10-CM | POA: Diagnosis not present

## 2019-02-14 DIAGNOSIS — Z7982 Long term (current) use of aspirin: Secondary | ICD-10-CM | POA: Diagnosis not present

## 2019-02-14 DIAGNOSIS — Z794 Long term (current) use of insulin: Secondary | ICD-10-CM | POA: Diagnosis not present

## 2019-02-14 DIAGNOSIS — E119 Type 2 diabetes mellitus without complications: Secondary | ICD-10-CM | POA: Diagnosis not present

## 2019-02-14 DIAGNOSIS — I2510 Atherosclerotic heart disease of native coronary artery without angina pectoris: Secondary | ICD-10-CM | POA: Diagnosis not present

## 2019-02-14 DIAGNOSIS — I214 Non-ST elevation (NSTEMI) myocardial infarction: Secondary | ICD-10-CM | POA: Diagnosis not present

## 2019-02-14 DIAGNOSIS — E785 Hyperlipidemia, unspecified: Secondary | ICD-10-CM | POA: Diagnosis not present

## 2019-02-14 DIAGNOSIS — Z9861 Coronary angioplasty status: Secondary | ICD-10-CM

## 2019-02-14 DIAGNOSIS — I10 Essential (primary) hypertension: Secondary | ICD-10-CM | POA: Diagnosis not present

## 2019-02-14 DIAGNOSIS — Z79899 Other long term (current) drug therapy: Secondary | ICD-10-CM | POA: Diagnosis not present

## 2019-02-14 NOTE — Progress Notes (Signed)
Cardiac Individual Treatment Plan  Patient Details  Name: Nasser Ku MRN: [id number] Date of Birth: 62/12/1955 Referring Provider:     CARDIAC REHAB PHASE II ORIENTATION from 02/13/2061 in [facility] CARDIAC [facility]  Referring Provider  Dr. Tresa Endo       Initial Encounter Date:    CARDIAC REHAB PHASE II ORIENTATION from 02/13/2061 in [facility] CARDIAC REHAB  Date  02/14/19      Visit Diagnosis: NSTEMI (non-ST elevated myocardial infarction) (HCC)  CAD -S/P PCI  Patient's Home Medications on Admission:  Current Outpatient Medications:  .  acetaminophen (TYLENOL) 325 MG tablet, Take 650 mg by mouth every 6 (six) hours as needed., Disp: , Rfl:  .  aspirin EC 81 MG EC tablet, Take 1 tablet (81 mg total) by mouth daily., Disp:  , Rfl:  .  atorvastatin (LIPITOR) 80 MG tablet, Take 1 tablet (80 mg total) by mouth daily., Disp: 90 tablet, Rfl: 1 .  chlorpheniramine (CHLOR-TRIMETON) 4 MG tablet, Take 4 mg by mouth every 6 (six) hours as needed for allergies., Disp: , Rfl:  .  FARXIGA 10 MG TABS tablet, Take 10 mg by mouth daily., Disp: , Rfl:  .  gabapentin (NEURONTIN) 300 MG capsule, Take 300 mg by mouth at bedtime., Disp: , Rfl:  .  insulin degludec (TRESIBA FLEXTOUCH) 100 UNIT/ML SOPN FlexTouch Pen, Inject 30 Units into the skin daily., Disp: , Rfl:  .  isosorbide mononitrate (IMDUR) 30 MG 24 hr tablet, Take 1 tablet (30 mg total) by mouth daily., Disp: 90 tablet, Rfl: 3 .  metFORMIN (GLUCOPHAGE) 500 MG tablet, Take 500 mg by mouth 2 (two) times daily. , Disp: , Rfl:  .  metoprolol tartrate (LOPRESSOR) 25 MG tablet, Take 1 tablet (25 mg total) by mouth 2 (two) times daily., Disp: 180 tablet, Rfl: 1 .  nitroGLYCERIN (NITROSTAT) 0.4 MG SL tablet, Place 1 tablet (0.4 mg total) under the tongue every 5 (five) minutes x 3 doses as needed for chest pain., Disp: 25 tablet, Rfl: 3 .  ticagrelor (BRILINTA) 90 MG TABS tablet, Take 1 tablet (90 mg total) by mouth  2 (two) times daily., Disp: 180 tablet, Rfl: 3  Past Medical History: Past Medical History:  Diagnosis Date  . Arthritis   . Back pain   . CAD (coronary artery disease)    a. s/p NSTEMI in 12/2018 with 80% Prox-RCA stenosis followed by 100% prox to mid-RCA stenosis --> DESx2 to the RCA. Med management of residual disease.   . Carpal tunnel syndrome   . DDD (degenerative disc disease)   . Diabetes mellitus   . Hyperlipidemia   . Hypertension    states very mild  . Low testosterone   . Overweight   . Seasonal allergies   . Tachycardia     Tobacco Use: Social History   Tobacco Use  Smoking Status Never Smoker  Smokeless Tobacco Never Used    Labs: Recent Review Flowsheet Data    Labs for ITP Cardiac and Pulmonary Rehab Latest Ref Rng & Units 07/30/2011 12/23/2018 12/24/2018   Cholestrol 0 - 200 mg/dL - - 409   LDLCALC 0 - 99 mg/dL - - 811(B)   HDL >14 mg/dL - - 78(G)   Trlycerides <150 mg/dL - - 956   Hemoglobin O1H 4.8 - 5.6 % - 11.3(H) -   TCO2 0 - 100 mmol/L 25 - -      Capillary Blood Glucose: Lab Results  Component Value Date  GLUCAP 147 (H) 12/25/2018   GLUCAP 137 (H) 12/24/2018   GLUCAP 175 (H) 12/24/2018   GLUCAP 167 (H) 12/24/2018   GLUCAP 179 (H) 12/24/2018     Exercise Target Goals: Exercise Program Goal: Individual exercise prescription set using results from initial 6 min walk test and THRR while considering  patient's activity barriers and safety.   Exercise Prescription Goal: Starting with aerobic activity 30 plus minutes a day, 3 days per week for initial exercise prescription. Provide home exercise prescription and guidelines that participant acknowledges understanding prior to discharge.  Activity Barriers & Risk Stratification: Activity Barriers & Cardiac Risk Stratification - 02/14/19 1553      Activity Barriers & Cardiac Risk Stratification   Activity Barriers  Deconditioning;Muscular Weakness    Cardiac Risk Stratification  High        6 Minute Walk: 6 Minute Walk    Row Name 02/14/19 1552         6 Minute Walk   Phase  Initial     Distance  1370 feet     Walk Time  6 minutes     # of Rest Breaks  0     MPH  2.5     METS  2.9     RPE  11     Perceived Dyspnea   0     VO2 Peak  10.38     Symptoms  Yes (comment)     Comments  Tiredness     Resting HR  99 bpm     Resting BP  102/62     Resting Oxygen Saturation   97 %     Exercise Oxygen Saturation  during 6 min walk  98 %     Max Ex. HR  111 bpm     Max Ex. BP  132/78     2 Minute Post BP  118/72        Oxygen Initial Assessment:   Oxygen Re-Evaluation:   Oxygen Discharge (Final Oxygen Re-Evaluation):   Initial Exercise Prescription: Initial Exercise Prescription - 02/14/19 1500      Date of Initial Exercise RX and Referring Provider   Date  02/14/19    Referring Provider  Dr. Claiborne Billings     Expected Discharge Date  03/31/19      Track   Minutes  30      Prescription Details   Frequency (times per week)  5-7    Duration  Progress to 30 minutes of continuous aerobic without signs/symptoms of physical distress      Intensity   THRR 40-80% of Max Heartrate  63-126    Ratings of Perceived Exertion  11-13      Progression   Progression  Continue to progress workloads to maintain intensity without signs/symptoms of physical distress.      Resistance Training   Training Prescription  Yes    Weight  4 lbs.     Reps  10-15       Perform Capillary Blood Glucose checks as needed.  Exercise Prescription Changes:   Exercise Comments:   Exercise Goals and Review:  Exercise Goals    Row Name 02/14/19 1555             Exercise Goals   Increase Physical Activity  Yes       Intervention  Provide advice, education, support and counseling about physical activity/exercise needs.;Develop an individualized exercise prescription for aerobic and resistive training based on initial evaluation findings, risk stratification, comorbidities  and  participant's personal goals.       Expected Outcomes  Short Term: Attend rehab on a regular basis to increase amount of physical activity.;Long Term: Add in home exercise to make exercise part of routine and to increase amount of physical activity.;Long Term: Exercising regularly at least 3-5 days a week.       Increase Strength and Stamina  Yes       Intervention  Provide advice, education, support and counseling about physical activity/exercise needs.;Develop an individualized exercise prescription for aerobic and resistive training based on initial evaluation findings, risk stratification, comorbidities and participant's personal goals.       Expected Outcomes  Short Term: Increase workloads from initial exercise prescription for resistance, speed, and METs.;Short Term: Perform resistance training exercises routinely during rehab and add in resistance training at home;Long Term: Improve cardiorespiratory fitness, muscular endurance and strength as measured by increased METs and functional capacity ( )       Able to understand and use rate of perceived exertion (RPE) scale  Yes       Intervention  Provide education and explanation on how to use RPE scale       Expected Outcomes  Short Term: Able to use RPE daily in rehab to express subjective intensity level;Long Term:  Able to use RPE to guide intensity level when exercising independently       Knowledge and understanding of Target Heart Rate Range (THRR)  Yes       Intervention  Provide education and explanation of THRR including how the numbers were predicted and where they are located for reference       Expected Outcomes  Long Term: Able to use THRR to govern intensity when exercising independently;Short Term: Able to state/look up THRR;Short Term: Able to use daily as guideline for intensity in rehab       Able to check pulse independently  Yes       Intervention  Provide education and demonstration on how to check pulse in carotid and radial  arteries.;Review the importance of being able to check your own pulse for safety during independent exercise       Expected Outcomes  Short Term: Able to explain why pulse checking is important during independent exercise;Long Term: Able to check pulse independently and accurately       Understanding of Exercise Prescription  Yes       Intervention  Provide education, explanation, and written materials on patient's individual exercise prescription       Expected Outcomes  Short Term: Able to explain program exercise prescription;Long Term: Able to explain home exercise prescription to exercise independently          Exercise Goals Re-Evaluation :    Discharge Exercise Prescription (Final Exercise Prescription Changes):   Nutrition:  Target Goals: Understanding of nutrition guidelines, daily intake of sodium 1500mg , cholesterol 200mg , calories 30% from fat and 7% or less from saturated fats, daily to have 5 or more servings of fruits and vegetables.  Biometrics: Pre Biometrics - 02/14/19 1555      Pre Biometrics   Height  5' 7.5" (1.715 m)    Weight  111.7 kg    Waist Circumference  49.5 inches    Hip Circumference  50 inches    Waist to Hip Ratio  0.99 %    BMI (Calculated)  37.98    Triceps Skinfold  33 mm    % Body Fat  39.1 %    Grip Strength  43 kg    Flexibility  14.5 in    Single Leg Stand  5.31 seconds        Nutrition Therapy Plan and Nutrition Goals:   Nutrition Assessments:   Nutrition Goals Re-Evaluation:   Nutrition Goals Discharge (Final Nutrition Goals Re-Evaluation):   Psychosocial: Target Goals: Acknowledge presence or absence of significant depression and/or stress, maximize coping skills, provide positive support system. Participant is able to verbalize types and ability to use techniques and skills needed for reducing stress and depression.  Initial Review & Psychosocial Screening: Initial Psych Review & Screening - 02/15/19 0732       Initial Review   Current issues with  None Identified      Family Dynamics   Good Support System?  Yes    Comments  Mr. Swinford is very eager to participate in virtual CR after long discussion during orientation about the benefits of exercise in decreasing CAD risk factors and complications. He is very determined to not have to rely on his family for care. 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 Daughter and 62 year old son of the home are very supportive of patient. PHQ 2 score 0.      Barriers   Psychosocial barriers to participate in program  There are no identifiable barriers or psychosocial needs.      Screening Interventions   Interventions  Encouraged to exercise       Quality of Life Scores:  Scores of 19 and below usually indicate a poorer quality of life in these areas.  A difference of  2-3 points is a clinically meaningful difference.  A difference of 2-3 points in the total score of the Quality of Life Index has been associated with significant improvement in overall quality of life, self-image, physical symptoms, and general health in studies assessing change in quality of life.  PHQ-9: Recent Review Flowsheet Data    Depression screen [facility] 02/14/2019   Decreased Interest 0   Down, Depressed, Hopeless 0   PHQ - 2 Score 0     Interpretation of Total Score  Total Score Depression Severity:  1-4 = Minimal depression, 5-9 = Mild depression, 10-14 = Moderate depression, 15-19 = Moderately severe depression, 20-27 = Severe depression   Psychosocial Evaluation and Intervention:   Psychosocial Re-Evaluation:   Psychosocial Discharge (Final Psychosocial Re-Evaluation):   Vocational Rehabilitation: Provide vocational rehab assistance to qualifying candidates.   Vocational Rehab Evaluation & Intervention:   Education: Education Goals: Education classes will be provided on a weekly basis, covering required topics.  Participant will state understanding/return demonstration of topics presented.  Learning Barriers/Preferences:   Education Topics: Hypertension, Hypertension Reduction -Define heart disease and high blood pressure. Discus how high blood pressure affects the body and ways to reduce high blood pressure.   Exercise and Your Heart -Discuss why it is important to exercise, the FITT principles of exercise, normal and abnormal responses to exercise, and how to exercise safely.   Angina -Discuss definition of angina, causes of angina, treatment of angina, and how to decrease risk of having angina.   Cardiac Medications -Review what the following cardiac medications are used for, how they affect the body, and side effects that may occur when taking the medications.  Medications include Aspirin, Beta blockers, calcium channel blockers, ACE Inhibitors, angiotensin receptor blockers, diuretics, digoxin, and antihyperlipidemics.   Congestive Heart Failure -Discuss the definition of CHF, how to live with CHF, the signs  and symptoms of CHF, and how keep track of weight and sodium intake.   Heart Disease and Intimacy -Discus the effect sexual activity has on the heart, how changes occur during intimacy as we age, and safety during sexual activity.   Smoking Cessation / COPD -Discuss different methods to quit smoking, the health benefits of quitting smoking, and the definition of COPD.   Nutrition I: Fats -Discuss the types of cholesterol, what cholesterol does to the heart, and how cholesterol levels can be controlled.   Nutrition II: Labels -Discuss the different components of food labels and how to read food label   Heart Parts/Heart Disease and PAD -Discuss the anatomy of the heart, the pathway of blood circulation through the heart, and these are affected by heart disease.   Stress I: Signs and Symptoms -Discuss the causes of stress, how stress may lead to anxiety and depression,  and ways to limit stress.   Stress II: Relaxation -Discuss different types of relaxation techniques to limit stress.   Warning Signs of Stroke / TIA -Discuss definition of a stroke, what the signs and symptoms are of a stroke, and how to identify when someone is having stroke.   Knowledge Questionnaire Score:   Core Components/Risk Factors/Patient Goals at Admission:   Core Components/Risk Factors/Patient Goals Review:    Core Components/Risk Factors/Patient Goals at Discharge (Final Review):    ITP Comments: ITP Comments    Row Name 02/14/19 1518           ITP Comments  Dr. Armanda Magic, Medical Director Cardiac Rehab [facility]          Comments: Patient attended orientation on 02/15/2019 to review rules and guidelines for program.  Completed 6 minute walk test, Intitial ITP, and exercise prescription. Patient will not attend in-person CR at this time secondary to Covid-19 restrictions and CR in-person exercise suspension. Patient/Daughter was assisted with setting up Virtual Cardiac Rehab [employer] App. VSS. Telemetry-NSR/ST.  Asymptomatic. Safety measures and social distancing in place per CDC guidelines.

## 2019-02-15 ENCOUNTER — Ambulatory Visit (HOSPITAL_COMMUNITY): Payer: Self-pay

## 2019-02-15 LAB — GLUCOSE, CAPILLARY: Glucose-Capillary: 189 mg/dL — ABNORMAL HIGH (ref 70–99)

## 2019-02-15 NOTE — Progress Notes (Signed)
Late entry for 02/14/19 at 1600            Confirm Consent - In the setting of the current Covid19 crisis, you are scheduled for a phone visit with your Cardiac or Pulmonary team member.  Just as we do with many in-gym visits, in order for you to participate in this visit, we must obtain consent.  If you'd like, I can send this to your mychart (if signed up) or email for you to review.  Otherwise, I can obtain your verbal consent now.  By agreeing to a telephone visit, we'd like you to understand that the technology does not allow for your Cardiac or Pulmonary Rehab team member to perform a physical assessment, and thus may limit their ability to fully assess your ability to perform exercise programs. If your provider identifies any concerns that need to be evaluated in person, we will make arrangements to do so.  Finally, though the technology is pretty good, we cannot assure that it will always work on either your or our end and we cannot ensure that we have a secure connection.  Cardiac and Pulmonary Rehab Telehealth visits and "At Home" cardiac and pulmonary rehab are provided at no cost to you.        Are you willing to proceed?"        STAFF: Did the patient verbally acknowledge consent to telehealth visit? Document YES/NO here: Yes     Aayden Cefalu E. Laray Anger, BSN   Cardiac and Pulmonary Rehab Staff        Date 02/14/2019  @ Time 1630

## 2019-02-15 NOTE — Progress Notes (Signed)
Spoke to pt regarding Virtual Cardiac  and Pulmonary Rehab.  Pt  was able to download the [employer] app on their smart device with no issues. Pt set up their account and received the following welcome message -"Welcome to the [place] [facility]iac [facility] program. We hope that you will find the exercise program beneficial in your recovery process. Our staff is available to assist with any questions/concerns about your exercise routine. Best wishes". Brief orientation provided to with the advisement to watch the "Intro to Rehab" series located under the Resource tab. Pt verbalized understanding. Will continue to follow and monitor pt progress with feedback as needed. Cliford Sequeira E. Allianna Beaubien RN, BSN 

## 2019-02-20 ENCOUNTER — Encounter: Payer: Self-pay | Admitting: *Deleted

## 2019-02-20 ENCOUNTER — Ambulatory Visit (HOSPITAL_COMMUNITY): Payer: Self-pay

## 2019-02-20 DIAGNOSIS — Z006 Encounter for examination for normal comparison and control in clinical research program: Secondary | ICD-10-CM

## 2019-02-20 NOTE — Research (Signed)
AEGIS V7  Patient doing well, no complaints at this time. No med changes per pt.                                   "CONSENT"   YES     NO   Continuing further Investigational Product and study visits for follow-up? [x]  []   Continuing consent from future biomedical research [x]  []                                    "EVENTS"    YES     NO  AE   (IF YES SEE SOURCE) []  [x]   SAE  (IF YES SEE SOURCE) []  [x]   ENDPOINT   (IF YES SEE SOURCE) []  [x]   REVASCULARIZATION  (IF YES SEE SOURCE) []  [x]   AMPUTATION   (IF YES SEE SOURCE) []  [x]   TROPONIN'S  (IF YES SEE SOURCE) []  [x]     Current Outpatient Medications:  .  acetaminophen (TYLENOL) 325 MG tablet, Take 650 mg by mouth every 6 (six) hours as needed., Disp: , Rfl:  .  aspirin EC 81 MG EC tablet, Take 1 tablet (81 mg total) by mouth daily., Disp:  , Rfl:  .  atorvastatin (LIPITOR) 80 MG tablet, Take 1 tablet (80 mg total) by mouth daily., Disp: 90 tablet, Rfl: 1 .  chlorpheniramine (CHLOR-TRIMETON) 4 MG tablet, Take 4 mg by mouth every 6 (six) hours as needed for allergies., Disp: , Rfl:  .  FARXIGA 10 MG TABS tablet, Take 10 mg by mouth daily., Disp: , Rfl:  .  gabapentin (NEURONTIN) 300 MG capsule, Take 300 mg by mouth at bedtime., Disp: , Rfl:  .  insulin degludec (TRESIBA FLEXTOUCH) 100 UNIT/ML SOPN FlexTouch Pen, Inject 30 Units into the skin daily., Disp: , Rfl:  .  isosorbide mononitrate (IMDUR) 30 MG 24 hr tablet, Take 1 tablet (30 mg total) by mouth daily., Disp: 90 tablet, Rfl: 3 .  metFORMIN (GLUCOPHAGE) 500 MG tablet, Take 500 mg by mouth 2 (two) times daily. , Disp: , Rfl:  .  metoprolol tartrate (LOPRESSOR) 25 MG tablet, Take 1 tablet (25 mg total) by mouth 2 (two) times daily., Disp: 180 tablet, Rfl: 1 .  nitroGLYCERIN (NITROSTAT) 0.4 MG SL tablet, Place 1 tablet (0.4 mg total) under the tongue every 5 (five) minutes x 3 doses as needed for chest pain., Disp: 25 tablet, Rfl: 3 .  ticagrelor (BRILINTA) 90 MG TABS tablet, Take 1  tablet (90 mg total) by mouth 2 (two) times daily., Disp: 180 tablet, Rfl: 3

## 2019-02-21 ENCOUNTER — Other Ambulatory Visit: Payer: Self-pay

## 2019-02-21 ENCOUNTER — Encounter (HOSPITAL_COMMUNITY)
Admission: RE | Admit: 2019-02-21 | Discharge: 2019-02-21 | Disposition: A | Discharge status: Home / Self Care | Payer: Self-pay | Source: Ambulatory Visit | Attending: Cardiovascular Disease | Admitting: Cardiovascular Disease

## 2019-02-21 NOTE — Progress Notes (Signed)
Successful telephone encounter to Armen Pickup, patients daughter, per her request. Ms. Robin left VM message this am requesting call back. Per Ms. Rials, she was able to download the [facility] Virtual Cardiac Rehab app on her phone as instructed, however she is now attempting to assist patient with setting up app on his phone. Ms. Prime will contact CR RN once app has been downloaded. CR RN informed Ms. Jeschke that weekly phone calls to patient is another option for follow up while CR is closed to in-person exercise secondary to Covid-19 pandemic. Ms. Kelly would like to utilize app for patient if possible. Will follow up with patient and daughter later this week. Alaysiah Browder E. Vaughan Basta, BSN

## 2019-02-22 ENCOUNTER — Ambulatory Visit (HOSPITAL_COMMUNITY): Payer: Self-pay

## 2019-02-24 ENCOUNTER — Ambulatory Visit (HOSPITAL_COMMUNITY): Payer: Self-pay

## 2019-02-27 ENCOUNTER — Other Ambulatory Visit: Payer: Self-pay

## 2019-02-27 ENCOUNTER — Encounter (HOSPITAL_COMMUNITY)
Admission: RE | Admit: 2019-02-27 | Discharge: 2019-02-27 | Disposition: A | Discharge status: Home / Self Care | Payer: Self-pay | Source: Ambulatory Visit | Attending: Cardiovascular Disease | Admitting: Cardiovascular Disease

## 2019-02-27 ENCOUNTER — Ambulatory Visit (HOSPITAL_COMMUNITY): Payer: Self-pay

## 2019-02-27 ENCOUNTER — Telehealth: Payer: Self-pay | Admitting: Cardiovascular Disease

## 2019-02-27 ENCOUNTER — Telehealth (HOSPITAL_COMMUNITY): Payer: Self-pay | Admitting: *Deleted

## 2019-02-27 NOTE — Telephone Encounter (Signed)
Spoke with the patient says he has not downloaded the virtual cardiac rehab APP and that he has not done much walking at home. I offered to set up an appointment for Mr Keith Newton to meet with the dietitian. Patient declined. If patient is not able to download and use the virtual APP weekly phone call would be more appropriate.Gladstone Lighter, RN,BSN 02/27/2019 3:36 PM

## 2019-02-27 NOTE — Telephone Encounter (Signed)
New Message     Pts daughter is calling and says the pt is requesting to get a test done on his legs to see if there is a blockage.   Pts daughter is also concerned about his pulse rate. She says while he was checking his BP a couple of days ago it was in the 100's.   She also wants the Dr to stress to him about eating right and to exercise and to get the heart therapy like he is suppose to do.    Please advise

## 2019-03-01 ENCOUNTER — Ambulatory Visit (HOSPITAL_COMMUNITY): Payer: Self-pay

## 2019-03-01 ENCOUNTER — Telehealth: Payer: Self-pay | Admitting: Cardiovascular Disease

## 2019-03-01 ENCOUNTER — Encounter: Payer: Self-pay | Admitting: Cardiovascular Disease

## 2019-03-01 ENCOUNTER — Ambulatory Visit (INDEPENDENT_AMBULATORY_CARE_PROVIDER_SITE_OTHER): Payer: Self-pay | Admitting: Cardiovascular Disease

## 2019-03-01 ENCOUNTER — Other Ambulatory Visit: Payer: Self-pay

## 2019-03-01 DIAGNOSIS — I214 Non-ST elevation (NSTEMI) myocardial infarction: Secondary | ICD-10-CM

## 2019-03-01 DIAGNOSIS — Z9861 Coronary angioplasty status: Secondary | ICD-10-CM | POA: Diagnosis not present

## 2019-03-01 DIAGNOSIS — R0789 Other chest pain: Secondary | ICD-10-CM

## 2019-03-01 DIAGNOSIS — Z79899 Other long term (current) drug therapy: Secondary | ICD-10-CM | POA: Diagnosis not present

## 2019-03-01 DIAGNOSIS — G4700 Insomnia, unspecified: Secondary | ICD-10-CM

## 2019-03-01 DIAGNOSIS — E785 Hyperlipidemia, unspecified: Secondary | ICD-10-CM

## 2019-03-01 DIAGNOSIS — I2510 Atherosclerotic heart disease of native coronary artery without angina pectoris: Secondary | ICD-10-CM

## 2019-03-01 DIAGNOSIS — E118 Type 2 diabetes mellitus with unspecified complications: Secondary | ICD-10-CM

## 2019-03-01 DIAGNOSIS — E669 Obesity, unspecified: Secondary | ICD-10-CM

## 2019-03-01 DIAGNOSIS — R0683 Snoring: Secondary | ICD-10-CM

## 2019-03-01 MED ORDER — METOPROLOL TARTRATE 50 MG PO TABS: 50.0000 mg | ORAL_TABLET | Freq: Two times a day (BID) | ORAL | 3 refills | Status: DC

## 2019-03-01 NOTE — Progress Notes (Signed)
Cardiology Office Note    Date:  03/01/2019   ID:  Keith Newton, DOB May 28, 1955, MRN [id number]  PCP:  Haywood Pao, MD  Cardiologist:  Shelva Majestic, MD   Initial office visit with me  History of Present Illness:  Keith Newton is a 64 y.o. male who has a history of diabetes mellitus, obesity, and was hospitalized on December 23, 2018 after awakening from sleep with severe chest pain.  I had seen him for initial evaluation and blood pressure on presentation was 181/114 with initial high-sensitivity troponin at 4628.  He underwent emergent cardiac catheterization by Dr. Irish Lack and was found to have an 80% proximal gnosis followed by total occlusion of the RCA.  He underwent successful stenting with tandem 3.0x16 and 2.75 x 38 mm Synergy stents.  He had concomitant CAD with 40% proximal LAD stenosis, 25% mid LAD stenosis, 50% mid circumflex stenosis and 75% ostial PDA stenosis.  An echo Doppler study showed preserved global LV function with EF 55 to 60%.  There was moderate LVH and basal inferior hypokinesis.  The patient has been seen by Kerin Ransom, PA-C, on 2 occasions following his hospitalization with his last visit in November 2020.  He states that he has lost almost 30 pounds since his myocardial infarction.  He denies any definitive anginal type symptoms but at times does experience a vague chest sensation.  Of note, he was awakened from sleep with his initial MI.  He also admits that he continues to have difficulty with sleep and at times he feels that he cannot get enough air while sleeping.  He does admit to snoring, frequent awakenings, and nocturia at least 3 times per night.  He also has noticed that his pulse rate tends to be increased and at times increases to over 100 in a regular rhythm.  He presents to the office for initial evaluation with me.   Past Medical History:  Diagnosis Date  . Arthritis   . Back pain   . CAD (coronary artery disease)    a. s/p NSTEMI in  12/2018 with 80% Prox-RCA stenosis followed by 100% prox to mid-RCA stenosis --> DESx2 to the RCA. Med management of residual disease.   . Carpal tunnel syndrome   . DDD (degenerative disc disease)   . Diabetes mellitus   . Hyperlipidemia   . Hypertension    states very mild  . Low testosterone   . Overweight   . Seasonal allergies   . Tachycardia     Past Surgical History:  Procedure Laterality Date  . CARDIAC CATHETERIZATION    . CARPAL TUNNEL RELEASE  07/02/2011   Procedure: CARPAL TUNNEL RELEASE;  Surgeon: Cammie Sickle., MD;  Location: [facility];  Service: Orthopedics;  Laterality: Right;  . CARPAL TUNNEL RELEASE  07/30/2011   Procedure: CARPAL TUNNEL RELEASE;  Surgeon: Cammie Sickle., MD;  Location: [facility];  Service: Orthopedics;  Laterality: Left;  . CORONARY ANGIOPLASTY    . CORONARY STENT INTERVENTION N/A 12/23/2018   Procedure: CORONARY STENT INTERVENTION;  Surgeon: Jettie Booze, MD;  Location: [facility];  Service: Cardiovascular;  Laterality: N/A;  . LEFT HEART CATH AND CORONARY ANGIOGRAPHY N/A 12/23/2018   Procedure: LEFT HEART CATH AND CORONARY ANGIOGRAPHY;  Surgeon: Jettie Booze, MD;  Location: [facility];  Service: Cardiovascular;  Laterality: N/A;  . LUMBAR EPIDURAL INJECTION     x2    Current Medications: Outpatient Medications  Prior to Visit  Medication Sig Dispense Refill  . aspirin EC 81 MG EC tablet Take 1 tablet (81 mg total) by mouth daily.    Marland Kitchen atorvastatin (LIPITOR) 80 MG tablet Take 1 tablet (80 mg total) by mouth daily. 90 tablet 1  . chlorpheniramine (CHLOR-TRIMETON) 4 MG tablet Take 4 mg by mouth every 6 (six) hours as needed for allergies.    Marland Kitchen FARXIGA 10 MG TABS tablet Take 10 mg by mouth daily.    Marland Kitchen gabapentin (NEURONTIN) 300 MG capsule Take 300 mg by mouth at bedtime.    . insulin degludec (TRESIBA FLEXTOUCH) 100 UNIT/ML SOPN FlexTouch Pen Inject 30 Units into the skin  daily.    . isosorbide mononitrate (IMDUR) 30 MG 24 hr tablet Take 1 tablet (30 mg total) by mouth daily. 90 tablet 3  . metFORMIN (GLUCOPHAGE) 500 MG tablet Take 500 mg by mouth 2 (two) times daily.     . nitroGLYCERIN (NITROSTAT) 0.4 MG SL tablet Place 1 tablet (0.4 mg total) under the tongue every 5 (five) minutes x 3 doses as needed for chest pain. 25 tablet 3  . ticagrelor (BRILINTA) 90 MG TABS tablet Take 1 tablet (90 mg total) by mouth 2 (two) times daily. 180 tablet 3  . metoprolol tartrate (LOPRESSOR) 25 MG tablet Take 1 tablet (25 mg total) by mouth 2 (two) times daily. 180 tablet 1  . acetaminophen (TYLENOL) 325 MG tablet Take 650 mg by mouth every 6 (six) hours as needed.     No facility-administered medications prior to visit.     Allergies:   Codeine   Social History   Socioeconomic History  . Marital status: Married    Spouse name: Giulio Bertino  . Number of children: 2  . Years of education: 16  . Highest education level: Bachelor's degree (e.g., BA, AB, BS)  Occupational History  . Occupation: retired  Tobacco Use  . Smoking status: Never Smoker  . Smokeless tobacco: Never Used  Substance and Sexual Activity  . Alcohol use: No  . Drug use: No  . Sexual activity: Not on file  Other Topics Concern  . Not on file  Social History Narrative  . Not on file   Social Determinants of Health   Financial Resource Strain: Low Risk   . Difficulty of Paying Living Expenses: Not hard at all  Food Insecurity: No Food Insecurity  . Worried About Charity fundraiser in the Last Year: Never true  . Ran Out of Food in the Last Year: Never true  Transportation Needs: No Transportation Needs  . Lack of Transportation (Medical): No  . Lack of Transportation (Non-Medical): No  Physical Activity: Inactive  . Days of Exercise per Week: 0 days  . Minutes of Exercise per Session: 0 min  Stress: No Stress Concern Present  . Feeling of Stress : Only a little  Social Connections:     . Frequency of Communication with Friends and Family: Not on file  . Frequency of Social Gatherings with Friends and Family: Not on file  . Attends Religious Services: Not on file  . Active Member of Clubs or Organizations: Not on file  . Attends Archivist Meetings: Not on file  . Marital Status: Not on file     Family History:  The patient's family history includes CAD in his father, maternal grandfather, and paternal grandfather; Stroke in his mother.   ROS General: Negative; No fevers, chills, or night sweats;  HEENT: Negative;  No changes in vision or hearing, sinus congestion, difficulty swallowing Pulmonary: Negative; No cough, wheezing, shortness of breath, hemoptysis Cardiovascular: See HPI GI: Negative; No nausea, vomiting, diarrhea, or abdominal pain GU: Negative; No dysuria, hematuria, or difficulty voiding Musculoskeletal: Negative; no myalgias, joint pain, or weakness Hematologic/Oncology: Negative; no easy bruising, bleeding Endocrine: Positive for diabetes Neuro: Negative; no changes in balance, headaches Skin: Negative; No rashes or skin lesions Psychiatric: Negative; No behavioral problems, depression Sleep: Poor sleep with frequent awakenings, snoring, and at times a sensation that he cannot get enough breath.  No bruxism, restless legs, hypnogognic hallucinations, no cataplexy Other comprehensive 14 point system review is negative.   PHYSICAL EXAM:   VS:  BP (!) 140/105   Pulse 96   Temp (!) 96.8 F (36 C)   Ht 5' 8"  (1.727 m)   Wt 244 lb 3.2 oz (110.8 kg)   SpO2 99%   BMI 37.13 kg/m     Repeat blood pressure by me was 112/70  Wt Readings from Last 3 Encounters:  03/01/19 244 lb 3.2 oz (110.8 kg)  02/14/19 246 lb 4.1 oz (111.7 kg)  01/30/19 244 lb 12.8 oz (111 kg)    General: Alert, oriented, no distress.  Skin: normal turgor, no rashes, warm and dry HEENT: Normocephalic, atraumatic. Pupils equal round and reactive to light; sclera  anicteric; extraocular muscles intact;  Nose without nasal septal hypertrophy Mouth/Parynx benign; Mallinpatti scale 3 Neck: No JVD, no carotid bruits; normal carotid upstroke Lungs: clear to ausculatation and percussion; no wheezing or rales Chest wall: without tenderness to palpitation Heart: PMI not displaced, RRR, s1 s2 normal, 1/6 systolic murmur, no diastolic murmur, no rubs, gallops, thrills, or heaves Abdomen: soft, nontender; no hepatosplenomehaly, BS+; abdominal aorta nontender and not dilated by palpation. Back: no CVA tenderness Pulses 2+ Musculoskeletal: full range of motion, normal strength, no joint deformities Extremities: Tight lower extremity without significant edema no clubbing cyanosis or edema, Homan's sign negative  Neurologic: grossly nonfocal; Cranial nerves grossly wnl Psychologic: Normal mood and affect   Studies/Labs Reviewed:   EKG:  EKG is ordered today.  ECG (independently read by me): Normal sinus rhythm at 96 bpm.  Q wave in lead III.  QTc interval 434 ms.  No ectopy.  Recent Labs: BMP Latest Ref Rng & Units 01/02/2019 12/25/2018 12/24/2018  Glucose 65 - 99 mg/dL 191(H) 257(H) 192(H)  BUN 8 - 27 mg/dL 22 11 8   Creatinine 0.76 - 1.27 mg/dL 1.21 1.21 0.97  BUN/Creat Ratio 10 - 24 18 - -  Sodium 134 - 144 mmol/L 138 137 137  Potassium 3.5 - 5.2 mmol/L 4.4 3.8 3.6  Chloride 96 - 106 mmol/L 100 104 104  CO2 20 - 29 mmol/L 20 23 22   Calcium 8.6 - 10.2 mg/dL 10.4(H) 9.2 9.2     Hepatic Function Latest Ref Rng & Units 12/25/2018  Total Protein 6.5 - 8.1 g/dL 6.8  Albumin 3.5 - 5.0 g/dL 3.2(L)  AST 15 - 41 U/L 51(H)  ALT 0 - 44 U/L 25  Alk Phosphatase 38 - 126 U/L 52  Total Bilirubin 0.3 - 1.2 mg/dL 1.0    CBC Latest Ref Rng & Units 12/24/2018 12/23/2018 07/30/2011  WBC 4.0 - 10.5 K/uL 8.3 8.6 -  Hemoglobin 13.0 - 17.0 g/dL 13.9 15.7 17.0  Hematocrit 39.0 - 52.0 % 42.9 48.3 50.0  Platelets 150 - 400 K/uL 275 285 -   Lab Results  Component Value Date    MCV 84.8 12/24/2018  MCV 85.8 12/23/2018   Lab Results  Component Value Date   TSH 0.418 12/23/2018   Lab Results  Component Value Date   HGBA1C 11.3 (H) 12/23/2018     BNP No results found for: BNP  ProBNP No results found for: PROBNP   Lipid Panel     Component Value Date/Time   CHOL 191 12/24/2018 0214   TRIG 124 12/24/2018 0214   HDL 29 (L) 12/24/2018 0214   CHOLHDL 6.6 12/24/2018 0214   VLDL 25 12/24/2018 0214   LDLCALC 137 (H) 12/24/2018 0214     RADIOLOGY: No results found.   Additional studies/ records that were reviewed today include:   EMERGENT CATH/PCI: 12/23/2018  Prox - mid RCA-2 lesion is 100% stenosed. This was the culprit lesion.  A drug-eluting stent was successfully placed using a STENT SYNERGY DES [id number].  Post intervention, there is a 0% residual stenosis.  Prox RCA-1 lesion is 80% stenosed.  A drug-eluting stent was successfully placed using a STENT SYNERGY DES 3X16, overlapping the first stent.  Post intervention, there is a 0% residual stenosis.  RPDA lesion is 75% stenosed. This was a small vessel. Medical treatment planned.  Mid Cx lesion is 50% stenosed.  Mid LAD lesion is 40% stenosed.  Mid LAD to Dist LAD lesion is 25% stenosed.  The left ventricular systolic function is normal.  LV end diastolic pressure is normal.  The left ventricular ejection fraction is 55-65% by visual estimate.  There is no aortic valve stenosis.  Tortuosity in right subclavian did not allow stiffer Cordis XBRCA catheter to pass into ascending aorta. Medtronic Launcher catheter was able to pass from right radial approach.   Watch in [place].  Continue aggressive secondary prevention.  He needs weight loss and DM control, high dose statin and beta blocker.   He is interested in learning more about exercise and healthy diet.  Recommend cardiac rehab.   DAPT for 1 year given ACS.    ECHO 12/23/2018 IMPRESSIONS  1. Left ventricular ejection  fraction, by visual estimation, is 55 to 60%. The left ventricle has normal function. There is moderately increased left ventricular hypertrophy. Basal inferior hypokinesis.  2. Global right ventricle has mildly reduced systolic function.The right ventricular size is normal. No increase in right ventricular wall thickness.  3. Left atrial size was normal.  4. Right atrial size was normal.  5. The mitral valve is normal in structure. No evidence of mitral valve regurgitation.  6. The tricuspid valve is normal in structure. Tricuspid valve regurgitation is trivial.  7. The aortic valve is tricuspid. Aortic valve regurgitation is not visualized.  8. The pulmonic valve was not well visualized. Pulmonic valve regurgitation is trivial.   ASSESSMENT:    1. NSTEMI (non-ST elevated myocardial infarction) ([facility])   2. CAD -S/P PCI   3. Hyperlipidemia LDL goal <70   4. Snoring   5. Frequent nocturnal awakening   6. Type 2 diabetes mellitus with complication (HCC)   7. Obesity, Class II, BMI 35-39.9   8. Chest pain, atypical   9. Medication management      PLAN:  Mr. Keith Newton is a pleasant 64 year old gentleman who has a history of obesity, diabetes mellitus, and was awakened from sleep with severe chest pain on December 23, 2018.  He went emergent catheterization which revealed total RCA occlusion which was successfully stented with tandem Synergy DES stents.  He appears to have had significant salvage of myocardium in his initial echo Doppler study  showed an EF of 55 to 60% with only a small area of basal inferior hypokinesis.  The patient has noticed increased heart rate since his MI despite being on metoprolol tartrate 25 mg twice a day.  His ECG today shows sinus rhythm at 96 with a Q-wave in lead III.  He was hypertensive when his blood pressure was initially taken by the nurse but on repeat by me was significantly improved at 112/70.  I am recommending further titration of metoprolol to 50 mg  twice a day.  He continues to be on low-dose isosorbide mononitrate 30 mg.  He has concomitant CAD involving his LAD circumflex and PDA vessel for which medical therapy is recommended.  I am concerned that his initial event awakened him from sleep.  He does have symptoms highly suggestive of obstructive sleep apnea with loud snoring, frequent awakenings, nocturia 3 times per night and with recent difficulty in sleeping well since he feels at times he cannot get enough air.  As result I will schedule him for a sleep study for further evaluation.  Of note, upon presentation with his MI he had significant only elevated glucose and hemoglobin A1c was 11.3.  He has subsequently lost 26 pounds and feels improved but is still moderately obese with a BMI of 37.13.  LDL cholesterol was 137.  He is now on atorvastatin 80 mg and has not had follow-up laboratory.  He is on [place] flex touch insulin , Metformin in addition to [place] for his diabetes mellitus.  He continues to be on dual antiplatelet therapy with aspirin and Brilinta and denies bleeding.  I discussed the importance of weight loss as well as increased activity with walking ideally 5 days/week for up to 30 minutes if at all possible.  He is fasting today and I will check a comprehensive metabolic panel, CBC, lipid studies and hemoglobin A1c.  I will see him in the office in 2 months for follow-up evaluation and further recommendations will be made at that time.  Time spent 30 minutes Medication Adjustments/Labs and Tests Ordered: Current medicines are reviewed at length with the patient today.  Concerns regarding medicines are outlined above.  Medication changes, Labs and Tests ordered today are listed in the Patient Instructions below. Patient Instructions  Medication Instructions:  INCREASE- Metoprolol Tartrate 50 mg by mouth twice a day  *If you need a refill on your cardiac medications before your next appointment, please call your pharmacy*  Lab  Work: CMP, CBC, HgB A1C and Fasting Lipids  If you have labs (blood work) drawn today and your tests are completely normal, you will receive your results only by: Marland Kitchen MyChart Message (if you have MyChart) OR . A paper copy in the mail If you have any lab test that is abnormal or we need to change your treatment, we will call you to review the results.  Testing/Procedures: Your physician has recommended that you have a sleep study. This test records several body functions during sleep, including: brain activity, eye movement, oxygen and carbon dioxide blood levels, heart rate and rhythm, breathing rate and rhythm, the flow of air through your mouth and nose, snoring, body muscle movements, and chest and belly movement.  Follow-Up: At [facility], you and your health needs are our priority.  As part of our continuing mission to provide you with exceptional heart care, we have created designated Provider Care Teams.  These Care Teams include your primary Cardiologist (physician) and Advanced Practice Providers (APPs -  Physician Assistants and Nurse Practitioners) who all work together to provide you with the care you need, when you need it.  Your next appointment:   2 month(s)  The format for your next appointment:   In Person  Provider:   Shelva Majestic, MD      Signed, Shelva Majestic, MD  03/01/2019 1:11 PM    [facility] [address] [place], [place], [place]  [address] Phone: [phone]

## 2019-03-01 NOTE — Telephone Encounter (Signed)
Spoke with the pt and his daughter and went over the visit summary from today and she will make the changes in his pill box to increase the metoprolol.

## 2019-03-01 NOTE — Patient Instructions (Signed)
Medication Instructions:  INCREASE- Metoprolol Tartrate 50 mg by mouth twice a day  *If you need a refill on your cardiac medications before your next appointment, please call your pharmacy*  Lab Work: CMP, CBC, HgB A1C and Fasting Lipids  If you have labs (blood work) drawn today and your tests are completely normal, you will receive your results only by: Marland Kitchen MyChart Message (if you have MyChart) OR . A paper copy in the mail If you have any lab test that is abnormal or we need to change your treatment, we will call you to review the results.  Testing/Procedures: Your physician has recommended that you have a sleep study. This test records several body functions during sleep, including: brain activity, eye movement, oxygen and carbon dioxide blood levels, heart rate and rhythm, breathing rate and rhythm, the flow of air through your mouth and nose, snoring, body muscle movements, and chest and belly movement.  Follow-Up: At [facility], you and your health needs are our priority.  As part of our continuing mission to provide you with exceptional heart care, we have created designated Provider Care Teams.  These Care Teams include your primary Cardiologist (physician) and Advanced Practice Providers (APPs -  Physician Assistants and Nurse Practitioners) who all work together to provide you with the care you need, when you need it.  Your next appointment:   2 month(s)  The format for your next appointment:   In Person  Provider:   Nicki Guadalajara, MD

## 2019-03-01 NOTE — Telephone Encounter (Signed)
LM for pts daughter, Kirk Ruths.  Called and obtained verbal consent given from the pt since he does not have a DPR on file.

## 2019-03-01 NOTE — Telephone Encounter (Signed)
Daughter of the patient called. The patient states that at his appointment with Dr. Tresa Endo his medications were adjusted. The daughter would like further clarification , as she is the one who organizes all of his medicine. Please call

## 2019-03-02 DIAGNOSIS — I2510 Atherosclerotic heart disease of native coronary artery without angina pectoris: Secondary | ICD-10-CM | POA: Diagnosis not present

## 2019-03-02 DIAGNOSIS — E1165 Type 2 diabetes mellitus with hyperglycemia: Secondary | ICD-10-CM | POA: Diagnosis not present

## 2019-03-02 DIAGNOSIS — E7800 Pure hypercholesterolemia, unspecified: Secondary | ICD-10-CM | POA: Diagnosis not present

## 2019-03-02 DIAGNOSIS — E1140 Type 2 diabetes mellitus with diabetic neuropathy, unspecified: Secondary | ICD-10-CM | POA: Diagnosis not present

## 2019-03-02 DIAGNOSIS — Z1331 Encounter for screening for depression: Secondary | ICD-10-CM | POA: Diagnosis not present

## 2019-03-02 LAB — COMPREHENSIVE METABOLIC PANEL
ALT: 23 IU/L (ref 0–44)
AST: 18 IU/L (ref 0–40)
Albumin/Globulin Ratio: 1.7 (ref 1.2–2.2)
Albumin: 4.7 g/dL (ref 3.8–4.8)
Alkaline Phosphatase: 84 IU/L (ref 39–117)
BUN/Creatinine Ratio: 14 (ref 10–24)
BUN: 15 mg/dL (ref 8–27)
Bilirubin Total: 0.8 mg/dL (ref 0.0–1.2)
CO2: 20 mmol/L (ref 20–29)
Calcium: 10 mg/dL (ref 8.6–10.2)
Chloride: 99 mmol/L (ref 96–106)
Creatinine, Ser: 1.04 mg/dL (ref 0.76–1.27)
GFR calc Af Amer: 89 mL/min/{1.73_m2} (ref >59)
GFR calc non Af Amer: 77 mL/min/{1.73_m2} (ref >59)
Globulin, Total: 2.8 g/dL (ref 1.5–4.5)
Glucose: 159 mg/dL — ABNORMAL HIGH (ref 65–99)
Potassium: 4.6 mmol/L (ref 3.5–5.2)
Sodium: 139 mmol/L (ref 134–144)
Total Protein: 7.5 g/dL (ref 6.0–8.5)

## 2019-03-02 LAB — CBC
Hematocrit: 44.5 % (ref 37.5–51.0)
Hemoglobin: 15.1 g/dL (ref 13.0–17.7)
MCH: 28.3 pg (ref 26.6–33.0)
MCHC: 33.9 g/dL (ref 31.5–35.7)
MCV: 83 fL (ref 79–97)
Platelets: 309 10*3/uL (ref 150–450)
RBC: 5.34 x10E6/uL (ref 4.14–5.80)
RDW: 15.1 % (ref 11.6–15.4)
WBC: 6.6 10*3/uL (ref 3.4–10.8)

## 2019-03-02 LAB — HEMOGLOBIN A1C
Est. average glucose Bld gHb Est-mCnc: 243 mg/dL
Hgb A1c MFr Bld: 10.1 % — ABNORMAL HIGH (ref 4.8–5.6)

## 2019-03-02 LAB — LIPID PANEL
Chol/HDL Ratio: 3.1 ratio (ref 0.0–5.0)
Cholesterol, Total: 110 mg/dL (ref 100–199)
HDL: 35 mg/dL — ABNORMAL LOW (ref >39)
LDL Chol Calc (NIH): 55 mg/dL (ref 0–99)
Triglycerides: 110 mg/dL (ref 0–149)
VLDL Cholesterol Cal: 20 mg/dL (ref 5–40)

## 2019-03-03 ENCOUNTER — Telehealth: Payer: Self-pay | Admitting: *Deleted

## 2019-03-03 ENCOUNTER — Ambulatory Visit (HOSPITAL_COMMUNITY): Payer: Self-pay

## 2019-03-03 ENCOUNTER — Other Ambulatory Visit: Payer: Self-pay | Admitting: Cardiovascular Disease

## 2019-03-03 DIAGNOSIS — R0683 Snoring: Secondary | ICD-10-CM

## 2019-03-03 DIAGNOSIS — I10 Essential (primary) hypertension: Secondary | ICD-10-CM

## 2019-03-03 DIAGNOSIS — G4700 Insomnia, unspecified: Secondary | ICD-10-CM

## 2019-03-03 NOTE — Telephone Encounter (Signed)
Called patient to inform him [employer] denied in lab sleep study. Approved HST. Patient states that he is not sure that he can ' do this.'  He will think about it and call me back. He was notified of HST appointment date and time.

## 2019-03-06 ENCOUNTER — Ambulatory Visit: Payer: Self-pay | Admitting: Cardiovascular Disease

## 2019-03-06 ENCOUNTER — Ambulatory Visit (HOSPITAL_COMMUNITY): Payer: Self-pay

## 2019-03-07 ENCOUNTER — Telehealth: Payer: Self-pay | Admitting: Cardiovascular Disease

## 2019-03-07 NOTE — Telephone Encounter (Signed)
Called daughter back per patient request after speaking with him about lab results. She is aware of his labs.   Discussed with her the bleeding, which she states is occasional, like clots, not persistent. He is on dual antiplatelet treatment. Advised to try saline/nasal spray and humidifier as heat can dry out nasal passages. If he has persistent nose bleeds, please call us back  Daughter also asked about sleep study. Madelon Lips CMA note from 1/15 - she will discuss HST (scheduled for 05/04/19) with her dad

## 2019-03-07 NOTE — Telephone Encounter (Signed)
[name] calling stating the patient has been having what he described as blood clots coming out of his nose. She says it's been happening for the last few days since 1/13 and that it is not a lot just a little bit. They are unsure of what it is and would like a call back for advice.

## 2019-03-08 ENCOUNTER — Ambulatory Visit (HOSPITAL_COMMUNITY): Payer: Self-pay

## 2019-03-10 ENCOUNTER — Telehealth (HOSPITAL_COMMUNITY): Payer: Self-pay | Admitting: *Deleted

## 2019-03-10 ENCOUNTER — Ambulatory Visit (HOSPITAL_COMMUNITY): Payer: Self-pay

## 2019-03-13 ENCOUNTER — Ambulatory Visit (HOSPITAL_COMMUNITY): Payer: Self-pay

## 2019-03-13 ENCOUNTER — Other Ambulatory Visit: Payer: Self-pay

## 2019-03-13 ENCOUNTER — Encounter (HOSPITAL_COMMUNITY)
Admission: RE | Admit: 2019-03-13 | Discharge: 2019-03-13 | Disposition: A | Discharge status: Home / Self Care | Payer: Self-pay | Source: Ambulatory Visit | Attending: Cardiovascular Disease | Admitting: Cardiovascular Disease

## 2019-03-13 ENCOUNTER — Telehealth (HOSPITAL_COMMUNITY): Payer: Self-pay | Admitting: *Deleted

## 2019-03-13 NOTE — Telephone Encounter (Signed)
Spoke with the patient regarding virtual cardiac rehab. [name] says that he has been walking some and walks when he goes out to the store. Mr Keith Newton has not been using the APP to document his exercise therefore we will discharge Mr Keith Newton from the [employer] APP. Mr Keith Newton says the he is interested in person phase 2 cardiac rehab when the program reopens.Gladstone Lighter, RN,BSN 03/13/2019 1:09 PM

## 2019-03-15 ENCOUNTER — Ambulatory Visit (HOSPITAL_COMMUNITY): Payer: Self-pay

## 2019-03-15 NOTE — Telephone Encounter (Signed)
Patient seen in office

## 2019-03-17 ENCOUNTER — Ambulatory Visit (HOSPITAL_COMMUNITY): Payer: Self-pay

## 2019-03-20 ENCOUNTER — Ambulatory Visit (HOSPITAL_COMMUNITY): Payer: Self-pay

## 2019-03-22 ENCOUNTER — Ambulatory Visit (HOSPITAL_COMMUNITY): Payer: Self-pay

## 2019-03-24 ENCOUNTER — Encounter: Payer: Self-pay | Admitting: *Deleted

## 2019-03-24 ENCOUNTER — Ambulatory Visit (HOSPITAL_COMMUNITY): Payer: Self-pay

## 2019-03-24 ENCOUNTER — Telehealth (HOSPITAL_COMMUNITY): Payer: Self-pay

## 2019-03-24 DIAGNOSIS — Z006 Encounter for examination for normal comparison and control in clinical research program: Secondary | ICD-10-CM

## 2019-03-24 NOTE — Research (Signed)
"CONSENT"   YES     NO   Continuing further Investigational Product and study visits for follow-up? [x]  []   Continuing consent from future biomedical research [x]  []                                      "EVENTS"    YES     NO  AE   (IF YES SEE SOURCE) []  [x]   SAE  (IF YES SEE SOURCE) []  [x]   ENDPOINT   (IF YES SEE SOURCE) []  [x]   REVASCULARIZATION  (IF YES SEE SOURCE) []  [x]   AMPUTATION   (IF YES SEE SOURCE) []  [x]   TROPONIN'S  (IF YES SEE SOURCE) []  [x]    Lifestyle Adherence Assessment:    YES NO  Abstinence from smoking/remaining tobacco free X   Cardiac Diet  X  Routine physical activity and/or cardiac rehabilitation X    [id number]  MOBILITY:    I HAVE NO PROBLEMS WALKING [x]   I HAVE SLIGHT PROBLEMS WALKING []   I HAVE MODERATE PROBLEMS WALKING []   I HAVE SEVERE PROBLEMS WALKING []   I AM UNABLE TO WALK  []     SELF-CARE:   I HAVE NO PROBLEMS WASING OR DRESSING MYSELF  [x]   I HAVE SLIGHT PROBLEMS WASHING OR DRESSING MYSELF  []   I HAVE MODERATE PROBLEMS WASHING OR DRESSING MYSELF []   I HAVE SEVERE PROBLEMS WASHING OR DRESSING MYSELF  []   I HAVE SEVERE PROBLEMS WASHING OR DRESSING MYSELF  []   I AM UNABLE TO WASH OR DRESS MYSELF []     USUAL ACTIVITIES: (E.G. WORK/STUDY/HOUSEWORK/FAMILY OR LEISURE ACTIVITIES.    I HAVE NO PROBLEMS DOING MY USUAL ACTIVITIES [x]   I HAVE SLIGHT PROBLEMS DOING MY USUAL ACTIVITIES []   I HAVE MODERATE PROBLEMS DOING MY USUAL ACTIVIITIES []   I HAVE SEVERE PROBLEMS DOING MY USUAL ACTIVITIES []   I AM UNABLE TO DO MY USUAL ACTIVITIES []     PAIN /DISCOMFORT   I HAVE NO PAIN OR DISCOMFORT []   I HAVE SLIGHT PAIN OR DISCOMFORT [x]   I HAVE MODERATE PAIN OR DISCOMFORT []   I HAVE SEVERE PAIN OR DISCOMFORT []   I HAVE EXTREME PAIN OR DISCOMFORT []     ANXIETY/DEPRESSION   I AM NOT ANXIOUS OR DEPRESSED [x]   I AM SLIGHTLY ANXIOUS OR DEPRESSED []   I AM MODERATELY ANXIOUS OR DREPRESSED []   I AM SEVERELY ANXIOUS OR  DEPRESSED []   I AM EXTREMELY ANXIOUS OR DEPRESSED []     SCALE OF 0-100 HOW WOULD YOU RATE TODAY?  0 IS THE WORSE AND 100 IS THE BEST HEALTH YOU CAN IMAGINE: 75    Current Outpatient Medications:  .  acetaminophen (TYLENOL) 325 MG tablet, Take 650 mg by mouth every 6 (six) hours as needed., Disp: , Rfl:  .  aspirin EC 81 MG EC tablet, Take 1 tablet (81 mg total) by mouth daily., Disp:  , Rfl:  .  atorvastatin (LIPITOR) 80 MG tablet, Take 1 tablet (80 mg total) by mouth daily., Disp: 90 tablet, Rfl: 1 .  chlorpheniramine (CHLOR-TRIMETON) 4 MG tablet, Take 4 mg by mouth every 6 (six) hours as needed for allergies., Disp: , Rfl:  .  FARXIGA 10 MG TABS tablet, Take 10 mg by mouth  daily., Disp: , Rfl:  .  gabapentin (NEURONTIN) 300 MG capsule, Take 300 mg by mouth at bedtime., Disp: , Rfl:  .  insulin degludec (TRESIBA FLEXTOUCH) 100 UNIT/ML SOPN FlexTouch Pen, Inject 30 Units into the skin daily., Disp: , Rfl:  .  isosorbide mononitrate (IMDUR) 30 MG 24 hr tablet, Take 1 tablet (30 mg total) by mouth daily., Disp: 90 tablet, Rfl: 3 .  metFORMIN (GLUCOPHAGE) 500 MG tablet, Take 500 mg by mouth 2 (two) times daily. , Disp: , Rfl:  .  metoprolol tartrate (LOPRESSOR) 50 MG tablet, Take 1 tablet (50 mg total) by mouth 2 (two) times daily., Disp: 180 tablet, Rfl: 3 .  nitroGLYCERIN (NITROSTAT) 0.4 MG SL tablet, Place 1 tablet (0.4 mg total) under the tongue every 5 (five) minutes x 3 doses as needed for chest pain., Disp: 25 tablet, Rfl: 3 .  ticagrelor (BRILINTA) 90 MG TABS tablet, Take 1 tablet (90 mg total) by mouth 2 (two) times daily., Disp: 180 tablet, Rfl: 3    Patient states that he is doing well overall, he tries to walk x 15-20 minutes every other day. He is having some dizziness although he really can't say what brings it on or how long he has it, this is sporadic. He is having trouble maintaining a heart healthy diet; he only gets vegetables in cans and is eating fast food. He realizes this  adds a lot of sodium to his body. He is taking all medications as directed.

## 2019-03-24 NOTE — Telephone Encounter (Signed)
Pt insurance is active and benefits verified through [employer] Co-pay 0, DED $3,750/0 met, out of pocket 0/0 met, co-insurance 70%. no pre-authorization required, REF# [id number]  Will contact patient to see if he is interested in the Cardiac Rehab Program. If interested, patient will need to complete follow up appt. Once completed, patient will be contacted for scheduling upon review by the RN Navigator.

## 2019-03-27 ENCOUNTER — Ambulatory Visit (HOSPITAL_COMMUNITY): Payer: Self-pay

## 2019-03-27 NOTE — Telephone Encounter (Signed)
Spoke with pt daughter Kirk Ruths to let her know that we are opening back up on 04/03/2019 and if her father wanted to participate in the in house cardiac rehab, pt daughter stated that she will ask pt and let us know, also let pt daughter know that his insurance is the [employer] that will only cover [facility] facilities and that he would have to pay out of network cost. She stated that she would let him know. Advised her that he could go to [facility] they were affiliated with [facility].

## 2019-03-29 ENCOUNTER — Ambulatory Visit (HOSPITAL_COMMUNITY): Payer: Self-pay

## 2019-03-30 DIAGNOSIS — I10 Essential (primary) hypertension: Secondary | ICD-10-CM | POA: Diagnosis not present

## 2019-03-30 DIAGNOSIS — E1165 Type 2 diabetes mellitus with hyperglycemia: Secondary | ICD-10-CM | POA: Diagnosis not present

## 2019-03-30 DIAGNOSIS — Z794 Long term (current) use of insulin: Secondary | ICD-10-CM | POA: Diagnosis not present

## 2019-03-30 DIAGNOSIS — I2510 Atherosclerotic heart disease of native coronary artery without angina pectoris: Secondary | ICD-10-CM | POA: Diagnosis not present

## 2019-03-31 ENCOUNTER — Ambulatory Visit (HOSPITAL_COMMUNITY): Payer: Self-pay

## 2019-04-19 ENCOUNTER — Telehealth: Payer: Self-pay | Admitting: Cardiovascular Disease

## 2019-04-21 ENCOUNTER — Ambulatory Visit: Payer: Self-pay | Admitting: Cardiovascular Disease

## 2019-04-27 ENCOUNTER — Encounter: Payer: Self-pay | Admitting: Cardiology

## 2019-04-27 ENCOUNTER — Telehealth: Payer: Self-pay | Admitting: Cardiology

## 2019-04-27 ENCOUNTER — Telehealth (INDEPENDENT_AMBULATORY_CARE_PROVIDER_SITE_OTHER): Payer: BLUE CROSS/BLUE SHIELD | Admitting: Cardiology

## 2019-04-27 DIAGNOSIS — Z9861 Coronary angioplasty status: Secondary | ICD-10-CM

## 2019-04-27 DIAGNOSIS — M79605 Pain in left leg: Secondary | ICD-10-CM | POA: Diagnosis not present

## 2019-04-27 DIAGNOSIS — I10 Essential (primary) hypertension: Secondary | ICD-10-CM

## 2019-04-27 DIAGNOSIS — M79604 Pain in right leg: Secondary | ICD-10-CM | POA: Diagnosis not present

## 2019-04-27 DIAGNOSIS — I2510 Atherosclerotic heart disease of native coronary artery without angina pectoris: Secondary | ICD-10-CM | POA: Diagnosis not present

## 2019-04-27 DIAGNOSIS — I252 Old myocardial infarction: Secondary | ICD-10-CM

## 2019-04-27 DIAGNOSIS — R079 Chest pain, unspecified: Secondary | ICD-10-CM | POA: Insufficient documentation

## 2019-04-27 DIAGNOSIS — E118 Type 2 diabetes mellitus with unspecified complications: Secondary | ICD-10-CM

## 2019-04-27 DIAGNOSIS — E785 Hyperlipidemia, unspecified: Secondary | ICD-10-CM

## 2019-04-27 MED ORDER — ISOSORBIDE MONONITRATE ER 30 MG PO TB24: 30.0000 mg | ORAL_TABLET | Freq: Every day | ORAL | 3 refills | Status: DC

## 2019-04-27 NOTE — Patient Instructions (Signed)
Medication Instructions:   Continue Isosorbide 30 mg daily in the morning.  *If you need a refill on your cardiac medications before your next appointment, please call your pharmacy*   Testing/Procedures: Your physician has requested that you have a lower extremity arterial duplex. During this test, ultrasound is used to evaluate arterial blood flow in the legs. Allow one hour for this exam. There are no restrictions or special instructions.  Your physician has requested that you have an ankle brachial index (ABI). During this test an ultrasound and blood pressure cuff are used to evaluate the arteries that supply the arms and legs with blood. Allow thirty minutes for this exam. There are no restrictions or special instructions.  Our office will contact you to schedule this.   Follow-Up: At [facility], you and your health needs are our priority.  As part of our continuing mission to provide you with exceptional heart care, we have created designated Provider Care Teams.  These Care Teams include your primary Cardiologist (physician) and Advanced Practice Providers (APPs -  Physician Assistants and Nurse Practitioners) who all work together to provide you with the care you need, when you need it.  We recommend signing up for the patient portal called "MyChart".  Sign up information is provided on this After Visit Summary.  MyChart is used to connect with patients for Virtual Visits (Telemedicine).  Patients are able to view lab/test results, encounter notes, upcoming appointments, etc.  Non-urgent messages can be sent to your provider as well.   To learn more about what you can do with MyChart, go to [email or web address].    Your next appointment:   3-4 week(s)  The format for your next appointment:   In Person  Provider:   You may see Nicki Guadalajara, MD or one of the following Advanced Practice Providers on your designated Care Team:    Azalee Course, PA-C  Micah Flesher, [place] or    Judy Pimple, [place]    Other Instructions Our office will contact you to also schedule a follow-up appointment with Dr. Tresa Endo or a PA in 3-4 weeks.  You are scheduled to do a Home Sleep Test on 05/04/19.

## 2019-04-27 NOTE — Telephone Encounter (Signed)
I spoke with Keith Newton today for a televisit.  He is still having occasional chest pain.  I suggested he increase his Imdur to 60 mg a day and contact us in a week if his symptoms are not improved or worsening.  He wanted me to call his daughter and explained this to her since she apparently handles his medications.  He also complains of neuropathy in his feet and wanted to know what we could do about that, I referred him back to his primary care provider for this. I left a message for the patient's daughter to contact us so we could explain these recommendations.  [name] PA-C 04/27/2019 8:52 AM

## 2019-04-27 NOTE — Progress Notes (Signed)
Virtual Visit via Telephone Note   This visit type was conducted due to national recommendations for restrictions regarding the COVID-19 Pandemic (e.g. social distancing) in an effort to limit this patient's exposure and mitigate transmission in our community.  Due to his co-morbid illnesses, this patient is at least at moderate risk for complications without adequate follow up.  This format is felt to be most appropriate for this patient at this time.  The patient did not have access to video technology/had technical difficulties with video requiring transitioning to audio format only (telephone).  All issues noted in this document were discussed and addressed.  No physical exam could be performed with this format.  Please refer to the patient's chart for his  consent to telehealth for [facility].   The patient was identified using 2 identifiers.  Date:  04/27/2019   ID:  Keith Newton, DOB 1956-02-06, MRN [id number]  Patient Location: Home Provider Location: Home  PCP:  Tisovec, Fransico Him, MD  Cardiologist:  Shelva Majestic, MD  Electrophysiologist:  None   Evaluation Performed:  Follow-Up Visit  Chief Complaint:  Chest pain  History of Present Illness:    Keith Newton is a 64 y.o. male with a hx of hypertension, non-insulin-dependent diabetes, and a past evaluation for sinus tachycardia. The patient was admitted 12/23/2018 with nausea vomiting chest pain and shortness of breath. He was markedly hypertensive in the emergency room with a blood pressure of 180/114. HS troponin was greater than 27,000. He was taken to the Cath Lab where he had an intervention to 2 sites in the RCA. He tolerated this well. He does have residual 40% pLAD and 50% mCFX, and 75% PDA. Ejection fraction was 55-60% with basilar hypokinesis by echo. He was discharged 12/25/2018. He was enrolled in the [place].   I saw him in the office 01/02/2019 with complaints of orthostatic dizziness.  His B/P was  80 systolic standing. Labs showed no acute findings.  I stopped his Losartan and he was contacted today for follow up. His orthostatic dizziness has resolved.  He has multiple other concerns though- he describes numbness and tingling in his feet at night-(neuropathy).  Imdur 30 mg was added at the Nov OV for chest pain.  He was contacted today by phone for follow up. He has his daughter arrange his medications and he doesn't really know anything about them.  He still complains of intermittent chest pain.  It is hard to get a clear history.  It does not sound like his symptoms are worsening.  He does say they are similar to his preadmission symptoms although they do not last long.  They are not exertional.  He also complained about his numbness in his feet again.  To try and further clarify things I spoke with the patient's daughter.  She says they were told to take the Imdur at night because it would make him sleepy.  I suggested he take this in the morning since his symptoms of chest pain were during the day.  She also tells me that they were told in the hospital that he would need arterial Dopplers for his lower extremity pain.  I do not see that anywhere in the notes but I went ahead and order Dopplers.  They were contacted about a home sleep study but apparently that is at a standstill.  He does have an appointment coming up with Dr. Claiborne Billings on 318 and I will try and make sure they keep this.  The patient does not have symptoms concerning for COVID-19 infection (fever, chills, cough, or new shortness of breath).    Past Medical History:  Diagnosis Date  . Arthritis   . Back pain   . CAD (coronary artery disease)    a. s/p NSTEMI in 12/2018 with 80% Prox-RCA stenosis followed by 100% prox to mid-RCA stenosis --> DESx2 to the RCA. Med management of residual disease.   . Carpal tunnel syndrome   . DDD (degenerative disc disease)   . Diabetes mellitus   . Hyperlipidemia   . Hypertension    states  very mild  . Low testosterone   . Overweight   . Seasonal allergies   . Tachycardia    Past Surgical History:  Procedure Laterality Date  . CARDIAC CATHETERIZATION    . CARPAL TUNNEL RELEASE  07/02/2011   Procedure: CARPAL TUNNEL RELEASE;  Surgeon: Wyn Forster., MD;  Location: [facility];  Service: Orthopedics;  Laterality: Right;  . CARPAL TUNNEL RELEASE  07/30/2011   Procedure: CARPAL TUNNEL RELEASE;  Surgeon: Wyn Forster., MD;  Location: [facility];  Service: Orthopedics;  Laterality: Left;  . CORONARY ANGIOPLASTY    . CORONARY STENT INTERVENTION N/A 12/23/2018   Procedure: CORONARY STENT INTERVENTION;  Surgeon: Corky Crafts, MD;  Location: [facility] INVASIVE CV LAB;  Service: Cardiovascular;  Laterality: N/A;  . LEFT HEART CATH AND CORONARY ANGIOGRAPHY N/A 12/23/2018   Procedure: LEFT HEART CATH AND CORONARY ANGIOGRAPHY;  Surgeon: Corky Crafts, MD;  Location: [facility] INVASIVE CV LAB;  Service: Cardiovascular;  Laterality: N/A;  . LUMBAR EPIDURAL INJECTION     x2     Current Meds  Medication Sig  . acetaminophen (TYLENOL) 325 MG tablet Take 650 mg by mouth every 6 (six) hours as needed.  Marland Kitchen aspirin EC 81 MG EC tablet Take 1 tablet (81 mg total) by mouth daily.  Marland Kitchen atorvastatin (LIPITOR) 80 MG tablet Take 1 tablet (80 mg total) by mouth daily.  . chlorpheniramine (CHLOR-TRIMETON) 4 MG tablet Take 4 mg by mouth every 6 (six) hours as needed for allergies.  Marland Kitchen FARXIGA 10 MG TABS tablet Take 10 mg by mouth daily.  Marland Kitchen gabapentin (NEURONTIN) 300 MG capsule Take 300 mg by mouth at bedtime.  . insulin degludec (TRESIBA FLEXTOUCH) 100 UNIT/ML SOPN FlexTouch Pen Inject 30 Units into the skin daily.  . metFORMIN (GLUCOPHAGE) 500 MG tablet Take 500 mg by mouth 2 (two) times daily.   . metoprolol tartrate (LOPRESSOR) 50 MG tablet Take 1 tablet (50 mg total) by mouth 2 (two) times daily.  . nitroGLYCERIN (NITROSTAT) 0.4 MG SL tablet Place 1 tablet (0.4 mg  total) under the tongue every 5 (five) minutes x 3 doses as needed for chest pain.  . ticagrelor (BRILINTA) 90 MG TABS tablet Take 1 tablet (90 mg total) by mouth 2 (two) times daily.     Allergies:   Codeine   Social History   Tobacco Use  . Smoking status: Never Smoker  . Smokeless tobacco: Never Used  Substance Use Topics  . Alcohol use: No  . Drug use: No     Family Hx: The patient's family history includes CAD in his father, maternal grandfather, and paternal grandfather; Stroke in his mother.  ROS:   Please see the history of present illness.    All other systems reviewed and are negative.   Prior CV studies:   The following studies were reviewed today: Cath/ PCI 12/23/2018  Labs/Other Tests and Data Reviewed:    EKG:  An ECG dated 03/01/2019 was personally reviewed today and demonstrated:  NSR, inferior TWI  Recent Labs: 12/23/2018: TSH 0.418 03/01/2019: ALT 23; BUN 15; Creatinine, Ser 1.04; Hemoglobin 15.1; Platelets 309; Potassium 4.6; Sodium 139   Recent Lipid Panel Lab Results  Component Value Date/Time   CHOL 110 03/01/2019 10:51 AM   TRIG 110 03/01/2019 10:51 AM   HDL 35 (L) 03/01/2019 10:51 AM   CHOLHDL 3.1 03/01/2019 10:51 AM   CHOLHDL 6.6 12/24/2018 02:14 AM   LDLCALC 55 03/01/2019 10:51 AM    Wt Readings from Last 3 Encounters:  03/01/19 244 lb 3.2 oz (110.8 kg)  02/14/19 246 lb 4.1 oz (111.7 kg)  01/30/19 244 lb 12.8 oz (111 kg)     Objective:    Vital Signs:  There were no vitals taken for this visit.   VITAL SIGNS:  reviewed  ASSESSMENT & PLAN:    Chest pain- Possibly secondary to residual CAD- I suggested they take the Imdur in the morning instead of Q HS and see if that helps his symptoms.  NSTEMI (non-ST elevated myocardial infarction) (HCC) S/P inferior NSTEMI 12/23/2018  CAD -S/P PCI S/P RCA PCI with DES x 2 12/23/2018 Residual 40% LAD, 50% CFX, 75% PDA- EF 55%. Some chest pain-? Secondary to PDA/ small vessel CAD.    Type  2 diabetes mellitus with complication (HCC) Pt c/p neuropathy in his feet. The patient apparently was told he would get LEA dopplers to r/o vascular disease- I will order.   Hypertension HTN on admission- then hypotensive and symptomatic in the office. Symptoms resolved off Losartan  Hyperlipidemia LDL goal <70 On high dose statin Rx- check lipids and CMET 2 months  Obesity (BMI 30-39.9) BMI 38  COVID-19 Education: The signs and symptoms of COVID-19 were discussed with the patient and how to seek care for testing (follow up with PCP or arrange E-visit).  The importance of social distancing was discussed today.  Time:   Today, I have spent 25 minutes with the patient and with the pat's daughter with telehealth technology discussing the above problems.     Medication Adjustments/Labs and Tests Ordered: Current medicines are reviewed at length with the patient today.  Concerns regarding medicines are outlined above.   Tests Ordered: No orders of the defined types were placed in this encounter.   Medication Changes: No orders of the defined types were placed in this encounter.   Follow Up:  In Person Keep 3/18 appointment with Dr Tresa Endo  Signed, [name], PA-C  04/27/2019 8:56 AM    [facility]

## 2019-05-04 ENCOUNTER — Encounter (HOSPITAL_BASED_OUTPATIENT_CLINIC_OR_DEPARTMENT_OTHER): Payer: Self-pay | Admitting: Cardiovascular Disease

## 2019-05-10 ENCOUNTER — Ambulatory Visit (HOSPITAL_COMMUNITY)
Admission: RE | Admit: 2019-05-10 | Payer: BC Managed Care – PPO | Source: Ambulatory Visit | Attending: Cardiology | Admitting: Cardiology

## 2019-05-15 ENCOUNTER — Telehealth: Payer: Self-pay | Admitting: Cardiovascular Disease

## 2019-05-15 NOTE — Telephone Encounter (Signed)
We are recommending the COVID-19 vaccine to all of our patients. Cardiac medications (including blood thinners) should not deter anyone from being vaccinated and there is no need to hold any of those medications prior to vaccine administration.     Currently, there is a hotline to call (active 02/24/19) to schedule vaccination appointments as no walk-ins will be accepted.   Number: [phone].    If an appointment is not available please go to [email or web address] to sign up for notification when additional vaccine appointments are available.   If you have further questions or concerns about the vaccine process, please visit [email or web address] or contact your primary care physician.   

## 2019-05-19 ENCOUNTER — Other Ambulatory Visit: Payer: Self-pay

## 2019-05-19 ENCOUNTER — Ambulatory Visit (HOSPITAL_COMMUNITY)
Admission: RE | Admit: 2019-05-19 | Discharge: 2019-05-19 | Disposition: A | Discharge status: Home / Self Care | Payer: BC Managed Care – PPO | Source: Ambulatory Visit | Attending: Cardiology | Admitting: Cardiology

## 2019-05-19 ENCOUNTER — Encounter (HOSPITAL_COMMUNITY): Payer: Self-pay

## 2019-05-19 DIAGNOSIS — M79604 Pain in right leg: Secondary | ICD-10-CM

## 2019-05-23 ENCOUNTER — Telehealth: Payer: Self-pay | Admitting: Cardiology

## 2019-05-23 NOTE — Telephone Encounter (Signed)
I called the patient to follow up. Please see my Tele health visit note from last month.  When I spoke to him and his daughter then he mainly complained of leg pain and chest pain. He was not taking his Imdur correctly (he was taking it before bed).  I put him on Imdur QD in the AM and arranged for a LEA doppler.  When he came for the doppler he became upset and didn't know why he was having the test.  He left saying he wouldn't be back.  I called him today t discuss further.  He is frustrated because he continues to complain of chest pain.  He tells me the Imdur isn't helping but Tylenol is.  His leg pain clearly sounds like neuropathy but he feels its secondary to his coronary stent.  I suggested he come in to see an MD to discuss further. [name] PA-C 05/23/2019 4:35 PM

## 2019-05-24 ENCOUNTER — Telehealth: Payer: Self-pay | Admitting: Cardiovascular Disease

## 2019-05-24 NOTE — Telephone Encounter (Signed)
Left message for patient to call and schedule ASAP follow up with Dr. Tresa Endo per [name], PA.

## 2019-06-02 ENCOUNTER — Ambulatory Visit (INDEPENDENT_AMBULATORY_CARE_PROVIDER_SITE_OTHER): Payer: BC Managed Care – PPO | Admitting: Cardiovascular Disease

## 2019-06-02 ENCOUNTER — Other Ambulatory Visit: Payer: Self-pay

## 2019-06-02 ENCOUNTER — Encounter: Payer: Self-pay | Admitting: Cardiovascular Disease

## 2019-06-02 VITALS — BP 132/100 | HR 71 | Ht 68.0 in | Wt 255.6 lb

## 2019-06-02 DIAGNOSIS — Z9861 Coronary angioplasty status: Secondary | ICD-10-CM | POA: Diagnosis not present

## 2019-06-02 DIAGNOSIS — E785 Hyperlipidemia, unspecified: Secondary | ICD-10-CM | POA: Diagnosis not present

## 2019-06-02 DIAGNOSIS — R0683 Snoring: Secondary | ICD-10-CM

## 2019-06-02 DIAGNOSIS — I252 Old myocardial infarction: Secondary | ICD-10-CM

## 2019-06-02 DIAGNOSIS — R0789 Other chest pain: Secondary | ICD-10-CM | POA: Diagnosis not present

## 2019-06-02 DIAGNOSIS — I2510 Atherosclerotic heart disease of native coronary artery without angina pectoris: Secondary | ICD-10-CM | POA: Diagnosis not present

## 2019-06-02 DIAGNOSIS — E118 Type 2 diabetes mellitus with unspecified complications: Secondary | ICD-10-CM

## 2019-06-02 DIAGNOSIS — E669 Obesity, unspecified: Secondary | ICD-10-CM

## 2019-06-02 MED ORDER — ISOSORBIDE MONONITRATE ER 60 MG PO TB24: 60.0000 mg | ORAL_TABLET | Freq: Every day | ORAL | 3 refills | Status: DC

## 2019-06-02 NOTE — Patient Instructions (Signed)
Medication Instructions:  INCREASE ISOSORBIDE TO 60MG  IN THE MORNING *If you need a refill on your cardiac medications before your next appointment, please call your pharmacy*   Testing/Procedures: Your physician has recommended that you have a sleep study. This test records several body functions during sleep, including: brain activity, eye movement, oxygen and carbon dioxide blood levels, heart rate and rhythm, breathing rate and rhythm, the flow of air through your mouth and nose, snoring, body muscle movements, and chest and belly movement.  OUR SLEEP COORDINATOR WILL CALL TO SCHEDULE THIS   Follow-Up: At [facility], you and your health needs are our priority.  As part of our continuing mission to provide you with exceptional heart care, we have created designated Provider Care Teams.  These Care Teams include your primary Cardiologist (physician) and Advanced Practice Providers (APPs -  Physician Assistants and Nurse Practitioners) who all work together to provide you with the care you need, when you need it.  We recommend signing up for the patient portal called "MyChart".  Sign up information is provided on this After Visit Summary.  MyChart is used to connect with patients for Virtual Visits (Telemedicine).  Patients are able to view lab/test results, encounter notes, upcoming appointments, etc.  Non-urgent messages can be sent to your provider as well.   To learn more about what you can do with MyChart, go to [facility].    Your next appointment:   3 month(s)  The format for your next appointment:   In Person  Provider:   [email or web address], MD

## 2019-06-02 NOTE — Progress Notes (Signed)
Cardiology Office Note    Date:  06/04/2019   ID:  Keith Newton, DOB 06/17/1955, MRN [id number]  PCP:  Haywood Pao, MD  Cardiologist:  Shelva Majestic, MD    F/U office visit with me  History of Present Illness:  Keith Newton is a 64 y.o. male who I saw for his initial post hospital evaluation in January 2021.  He presents for 68-monthfollow-up evaluation.  Mr YRibeirohas a history of diabetes mellitus, obesity, and was hospitalized on December 23, 2018 after awakening from sleep with severe chest pain.  I had seen him for initial evaluation and blood pressure on presentation was 181/114 with initial high-sensitivity troponin at 4628.  He underwent emergent cardiac catheterization by Dr. VIrish Lackand was found to have an 80% proximal gnosis followed by total occlusion of the RCA.  He underwent successful stenting with tandem 3.0x16 and 2.75 x 38 mm Synergy stents.  He had concomitant CAD with 40% proximal LAD stenosis, 25% mid LAD stenosis, 50% mid circumflex stenosis and 75% ostial PDA stenosis.  An echo Doppler study showed preserved global LV function with EF 55 to 60%.  There was moderate LVH and basal inferior hypokinesis.  The patient has been seen by [name], on 2 occasions following his hospitalization with his last visit in November 2020.    I saw him for initial evaluation with me on March 01, 2019.  He had lost almost 30 pounds since his myocardial infarction.  He denied any definitive anginal type symptoms but at times does experience a vague chest sensation.  Of note, he was awakened from sleep with his initial MI.  He also admits that he continues to have difficulty with sleep and at times he feels that he cannot get enough air while sleeping.  He does admit to snoring, frequent awakenings, and nocturia at least 3 times per night.  He also has noticed that his pulse rate tends to be increased and at times increases to over 100 in a regular rhythm.  During that  evaluation, I discussed with him my concern that his initial event awakened him from a sound sleep and was concerned about obstructive sleep apnea contributing to nocturnal hypoxemia contributing to his acute coronary syndrome.  Since I saw him, he apparently had a telemedicine visit with employee on April 27, 2019.  He had noted some mild intermittent chest pains that were not exertionally precipitated and also complained of some numbness in his feet.  He was told to take Imdur in the morning instead of at bedtime.  Presently he still experiences some with a mild chest tightness which is per official and not associated with any activity.  At times it can last up to 20 to 25 minutes and then resolved.  He has continued to be on isosorbide 30 mg in addition to metoprolol tartrate 50 mg twice a day for anti-ischemic medication.  He is on aspirin and Brilinta for DAPT.  He is on atorvastatin 80 mg and LDL cholesterol was 55 on March 01 2019. He is diabetic on Tresiba, Farxiga and Metformin.  He presents for reevaluation  Past Medical History:  Diagnosis Date  . Arthritis   . Back pain   . CAD (coronary artery disease)    a. s/p NSTEMI in 12/2018 with 80% Prox-RCA stenosis followed by 100% prox to mid-RCA stenosis --> DESx2 to the RCA. Med management of residual disease.   . Carpal tunnel syndrome   . DDD (degenerative  disc disease)   . Diabetes mellitus   . Hyperlipidemia   . Hypertension    states very mild  . Low testosterone   . Overweight   . Seasonal allergies   . Tachycardia     Past Surgical History:  Procedure Laterality Date  . CARDIAC CATHETERIZATION    . CARPAL TUNNEL RELEASE  07/02/2011   Procedure: CARPAL TUNNEL RELEASE;  Surgeon: Cammie Sickle., MD;  Location: [facility];  Service: Orthopedics;  Laterality: Right;  . CARPAL TUNNEL RELEASE  07/30/2011   Procedure: CARPAL TUNNEL RELEASE;  Surgeon: Cammie Sickle., MD;  Location: [facility];  Service: Orthopedics;  Laterality: Left;  . CORONARY ANGIOPLASTY    . CORONARY STENT INTERVENTION N/A 12/23/2018   Procedure: CORONARY STENT INTERVENTION;  Surgeon: Jettie Booze, MD;  Location: [facility];  Service: Cardiovascular;  Laterality: N/A;  . LEFT HEART CATH AND CORONARY ANGIOGRAPHY N/A 12/23/2018   Procedure: LEFT HEART CATH AND CORONARY ANGIOGRAPHY;  Surgeon: Jettie Booze, MD;  Location: [facility];  Service: Cardiovascular;  Laterality: N/A;  . LUMBAR EPIDURAL INJECTION     x2    Current Medications: Outpatient Medications Prior to Visit  Medication Sig Dispense Refill  . acetaminophen (TYLENOL) 325 MG tablet Take 650 mg by mouth every 6 (six) hours as needed.    Marland Kitchen aspirin EC 81 MG EC tablet Take 1 tablet (81 mg total) by mouth daily.    Marland Kitchen atorvastatin (LIPITOR) 80 MG tablet Take 1 tablet (80 mg total) by mouth daily. 90 tablet 1  . chlorpheniramine (CHLOR-TRIMETON) 4 MG tablet Take 4 mg by mouth every 6 (six) hours as needed for allergies.    Marland Kitchen FARXIGA 10 MG TABS tablet Take 10 mg by mouth daily.    Marland Kitchen gabapentin (NEURONTIN) 300 MG capsule Take 300 mg by mouth at bedtime.    . insulin degludec (TRESIBA FLEXTOUCH) 100 UNIT/ML SOPN FlexTouch Pen Inject 30 Units into the skin daily.    . metFORMIN (GLUCOPHAGE) 500 MG tablet Take 500 mg by mouth 2 (two) times daily.     . metoprolol tartrate (LOPRESSOR) 50 MG tablet Take 1 tablet (50 mg total) by mouth 2 (two) times daily. 180 tablet 3  . nitroGLYCERIN (NITROSTAT) 0.4 MG SL tablet Place 1 tablet (0.4 mg total) under the tongue every 5 (five) minutes x 3 doses as needed for chest pain. 25 tablet 3  . ticagrelor (BRILINTA) 90 MG TABS tablet Take 1 tablet (90 mg total) by mouth 2 (two) times daily. 180 tablet 3  . isosorbide mononitrate (IMDUR) 30 MG 24 hr tablet Take 1 tablet (30 mg total) by mouth daily. 90 tablet 3   No facility-administered medications prior to visit.     Allergies:    Codeine   Social History   Socioeconomic History  . Marital status: Married    Spouse name: Jaylenn Altier  . Number of children: 2  . Years of education: 16  . Highest education level: Bachelor's degree (e.g., BA, AB, BS)  Occupational History  . Occupation: retired  Tobacco Use  . Smoking status: Never Smoker  . Smokeless tobacco: Never Used  Substance and Sexual Activity  . Alcohol use: No  . Drug use: No  . Sexual activity: Not on file  Other Topics Concern  . Not on file  Social History Narrative  . Not on file   Social Determinants of Health   Financial  Resource Strain: Low Risk   . Difficulty of Paying Living Expenses: Not hard at all  Food Insecurity: No Food Insecurity  . Worried About Charity fundraiser in the Last Year: Never true  . Ran Out of Food in the Last Year: Never true  Transportation Needs: No Transportation Needs  . Lack of Transportation (Medical): No  . Lack of Transportation (Non-Medical): No  Physical Activity: Inactive  . Days of Exercise per Week: 0 days  . Minutes of Exercise per Session: 0 min  Stress: No Stress Concern Present  . Feeling of Stress : Only a little  Social Connections:   . Frequency of Communication with Friends and Family:   . Frequency of Social Gatherings with Friends and Family:   . Attends Religious Services:   . Active Member of Clubs or Organizations:   . Attends Archivist Meetings:   Marland Kitchen Marital Status:      Family History:  The patient's family history includes CAD in his father, maternal grandfather, and paternal grandfather; Stroke in his mother.   ROS General: Negative; No fevers, chills, or night sweats;  HEENT: Negative; No changes in vision or hearing, sinus congestion, difficulty swallowing Pulmonary: Negative; No cough, wheezing, shortness of breath, hemoptysis Cardiovascular: See HPI GI: Negative; No nausea, vomiting, diarrhea, or abdominal pain GU: Negative; No dysuria, hematuria, or  difficulty voiding Musculoskeletal: Negative; no myalgias, joint pain, or weakness Hematologic/Oncology: Negative; no easy bruising, bleeding Endocrine: Positive for diabetes Neuro: Negative; no changes in balance, headaches Skin: Negative; No rashes or skin lesions Psychiatric: Negative; No behavioral problems, depression Sleep: Poor sleep with frequent awakenings, snoring, and at times a sensation that he cannot get enough breath.  No bruxism, restless legs, hypnogognic hallucinations, no cataplexy Other comprehensive 14 point system review is negative.   PHYSICAL EXAM:   VS:  BP (!) 132/100   Pulse 71   Ht 5' 8"  (1.727 m)   Wt 255 lb 9.6 oz (115.9 kg)   BMI 38.86 kg/m     Repeat blood pressure by me is 130/88  Wt Readings from Last 3 Encounters:  06/02/19 255 lb 9.6 oz (115.9 kg)  03/01/19 244 lb 3.2 oz (110.8 kg)  02/14/19 246 lb 4.1 oz (111.7 kg)     General: Alert, oriented, no distress.  Skin: normal turgor, no rashes, warm and dry HEENT: Normocephalic, atraumatic. Pupils equal round and reactive to light; sclera anicteric; extraocular muscles intact;  Nose without nasal septal hypertrophy Mouth/Parynx benign; Mallinpatti scale 3 Neck: No JVD, no carotid bruits; normal carotid upstroke Lungs: clear to ausculatation and percussion; no wheezing or rales Chest wall: without tenderness to palpitation Heart: PMI not displaced, RRR, s1 s2 normal, 1/6 systolic murmur, no diastolic murmur, no rubs, gallops, thrills, or heaves Abdomen: soft, nontender; no hepatosplenomehaly, BS+; abdominal aorta nontender and not dilated by palpation. Back: no CVA tenderness Pulses 2+ Musculoskeletal: full range of motion, normal strength, no joint deformities Extremities: no clubbing cyanosis or edema, Homan's sign negative  Neurologic: grossly nonfocal; Cranial nerves grossly wnl Psychologic: Normal mood and affect   Studies/Labs Reviewed:   EKG:  EKG is ordered today.  ECG  (independently read by me): Sinus rhythm with sinus arrhythmia.  Mild T wave abnormality inferiorly.  QTc interval 419 ms.  PR interval 160 ms.  March 01, 2019 ECG (independently read by me): Normal sinus rhythm at 96 bpm.  Q wave in lead III.  QTc interval 434 ms.  No ectopy.  Recent  Labs: BMP Latest Ref Rng & Units 03/01/2019 01/02/2019 12/25/2018  Glucose 65 - 99 mg/dL 159(H) 191(H) 257(H)  BUN 8 - 27 mg/dL 15 22 11   Creatinine 0.76 - 1.27 mg/dL 1.04 1.21 1.21  BUN/Creat Ratio 10 - 24 14 18  -  Sodium 134 - 144 mmol/L 139 138 137  Potassium 3.5 - 5.2 mmol/L 4.6 4.4 3.8  Chloride 96 - 106 mmol/L 99 100 104  CO2 20 - 29 mmol/L 20 20 23   Calcium 8.6 - 10.2 mg/dL 10.0 10.4(H) 9.2     Hepatic Function Latest Ref Rng & Units 03/01/2019 12/25/2018  Total Protein 6.0 - 8.5 g/dL 7.5 6.8  Albumin 3.8 - 4.8 g/dL 4.7 3.2(L)  AST 0 - 40 IU/L 18 51(H)  ALT 0 - 44 IU/L 23 25  Alk Phosphatase 39 - 117 IU/L 84 52  Total Bilirubin 0.0 - 1.2 mg/dL 0.8 1.0    CBC Latest Ref Rng & Units 03/01/2019 12/24/2018 12/23/2018  WBC 3.4 - 10.8 x10E3/uL 6.6 8.3 8.6  Hemoglobin 13.0 - 17.7 g/dL 15.1 13.9 15.7  Hematocrit 37.5 - 51.0 % 44.5 42.9 48.3  Platelets 150 - 450 x10E3/uL 309 275 285   Lab Results  Component Value Date   MCV 83 03/01/2019   MCV 84.8 12/24/2018   MCV 85.8 12/23/2018   Lab Results  Component Value Date   TSH 0.418 12/23/2018   Lab Results  Component Value Date   HGBA1C 10.1 (H) 03/01/2019     BNP No results found for: BNP  ProBNP No results found for: PROBNP   Lipid Panel     Component Value Date/Time   CHOL 110 03/01/2019 1051   TRIG 110 03/01/2019 1051   HDL 35 (L) 03/01/2019 1051   CHOLHDL 3.1 03/01/2019 1051   CHOLHDL 6.6 12/24/2018 0214   VLDL 25 12/24/2018 0214   LDLCALC 55 03/01/2019 1051   LABVLDL 20 03/01/2019 1051     RADIOLOGY: No results found.   Additional studies/ records that were reviewed today include:   EMERGENT CATH/PCI:  12/23/2018  Prox - mid RCA-2 lesion is 100% stenosed. This was the culprit lesion.  A drug-eluting stent was successfully placed using a STENT SYNERGY DES [id number].  Post intervention, there is a 0% residual stenosis.  Prox RCA-1 lesion is 80% stenosed.  A drug-eluting stent was successfully placed using a STENT SYNERGY DES 3X16, overlapping the first stent.  Post intervention, there is a 0% residual stenosis.  RPDA lesion is 75% stenosed. This was a small vessel. Medical treatment planned.  Mid Cx lesion is 50% stenosed.  Mid LAD lesion is 40% stenosed.  Mid LAD to Dist LAD lesion is 25% stenosed.  The left ventricular systolic function is normal.  LV end diastolic pressure is normal.  The left ventricular ejection fraction is 55-65% by visual estimate.  There is no aortic valve stenosis.  Tortuosity in right subclavian did not allow stiffer Cordis XBRCA catheter to pass into ascending aorta. Medtronic Launcher catheter was able to pass from right radial approach.   Watch in [place].  Continue aggressive secondary prevention.  He needs weight loss and DM control, high dose statin and beta blocker.   He is interested in learning more about exercise and healthy diet.  Recommend cardiac rehab.   DAPT for 1 year given ACS.    Intervention     ECHO 12/23/2018 IMPRESSIONS  1. Left ventricular ejection fraction, by visual estimation, is 55 to 60%. The left ventricle has normal  function. There is moderately increased left ventricular hypertrophy. Basal inferior hypokinesis.  2. Global right ventricle has mildly reduced systolic function.The right ventricular size is normal. No increase in right ventricular wall thickness.  3. Left atrial size was normal.  4. Right atrial size was normal.  5. The mitral valve is normal in structure. No evidence of mitral valve regurgitation.  6. The tricuspid valve is normal in structure. Tricuspid valve regurgitation is trivial.  7. The aortic  valve is tricuspid. Aortic valve regurgitation is not visualized.  8. The pulmonic valve was not well visualized. Pulmonic valve regurgitation is trivial.   ASSESSMENT:    1. History of non-ST elevation myocardial infarction (NSTEMI)   2. CAD S/P percutaneous coronary angioplasty   3. Atypical chest pain   4. Hyperlipidemia LDL goal <70   5. Obesity, Class II, BMI 35-39.9   6. Type 2 diabetes mellitus with complication (HCC)   7. Snoring      PLAN:  Mr. Keith Newton is a pleasant 64 year old gentleman who has a history of obesity, diabetes mellitus, and was awakened from sleep with severe chest pain on December 23, 2018.  He underwent emergent catheterization which revealed total RCA occlusion which was successfully stented with tandem Synergy DES stents.  He appears to have had significant salvage of myocardium in his initial echo Doppler study showed an EF of 55 to 60% with only a small area of basal inferior hypokinesis.  The patient has noticed increased heart rate since his MI despite being on metoprolol tartrate 25 mg twice a day.  When I initially saw him for follow-up in January 2021 ECG showed sinus rhythm at 96 bpm with a Q-wave in lead III.  He was hypertensive when his blood pressure was initially taken by the nurse but on repeat by me was significantly improved at 112/70.  I am recommending further titration of metoprolol to 50 mg twice a day.  He continued to be on low-dose isosorbide mononitrate 30 mg.  He has concomitant CAD involving his LAD circumflex and PDA vessel for which medical therapy is recommended.  I was concerned his initial <him from sleep and he had symptoms highly suggestive of sleep apnea with loud snoring, frequent awakenings, nocturia 3 times per night with difficulty in sleeping and a sensation that at times he could not get enough air.  I recommended a sleep study.  He never had a sleep study and I again have recommended a sleep evaluation.  With his recurrent  episodes of chest tightness although these seem atypical and not ischemic in etiology with his moderate concomitant CAD I have recommended dose titration of isosorbide to 60 mg daily.  We discussed proper diet and the importance of weight loss and exercise.  BMI is 38.86.  He is now on atorvastatin 80 mg.  LDL cholesterol on therapy in January was excellent at 42.  He is diabetic on Tresiba, Metformin and Farxiga.  He continues to be on DAPT with aspirin/Brilinta.  I will see him in several months for reevaluation following his sleep study.   Medication Adjustments/Labs and Tests Ordered: Current medicines are reviewed at length with the patient today.  Concerns regarding medicines are outlined above.  Medication changes, Labs and Tests ordered today are listed in the Patient Instructions below. Patient Instructions  Medication Instructions:  INCREASE ISOSORBIDE TO 60MG IN THE MORNING *If you need a refill on your cardiac medications before your next appointment, please call your pharmacy*   Testing/Procedures: Your  physician has recommended that you have a sleep study. This test records several body functions during sleep, including: brain activity, eye movement, oxygen and carbon dioxide blood levels, heart rate and rhythm, breathing rate and rhythm, the flow of air through your mouth and nose, snoring, body muscle movements, and chest and belly movement.  OUR SLEEP COORDINATOR WILL CALL TO SCHEDULE THIS   Follow-Up: At [facility], you and your health needs are our priority.  As part of our continuing mission to provide you with exceptional heart care, we have created designated Provider Care Teams.  These Care Teams include your primary Cardiologist (physician) and Advanced Practice Providers (APPs -  Physician Assistants and Nurse Practitioners) who all work together to provide you with the care you need, when you need it.  We recommend signing up for the patient portal called "MyChart".   Sign up information is provided on this After Visit Summary.  MyChart is used to connect with patients for Virtual Visits (Telemedicine).  Patients are able to view lab/test results, encounter notes, upcoming appointments, etc.  Non-urgent messages can be sent to your provider as well.   To learn more about what you can do with MyChart, go to [email or web address].    Your next appointment:   3 month(s)  The format for your next appointment:   In Person  Provider:   Shelva Majestic, MD       Signed, Shelva Majestic, MD  06/04/2019 12:46 PM    [facility] [address], [place], [place], [place]  [address] Phone: [phone]

## 2019-06-04 ENCOUNTER — Encounter: Payer: Self-pay | Admitting: Cardiovascular Disease

## 2019-06-14 ENCOUNTER — Other Ambulatory Visit: Payer: Self-pay

## 2019-06-14 ENCOUNTER — Telehealth: Payer: Self-pay | Admitting: Cardiovascular Disease

## 2019-06-14 DIAGNOSIS — M79604 Pain in right leg: Secondary | ICD-10-CM

## 2019-06-14 DIAGNOSIS — M79605 Pain in left leg: Secondary | ICD-10-CM

## 2019-06-14 NOTE — Telephone Encounter (Signed)
Called and spoke with pt's daughter per verbal consent from pt. Notified that per our pharmacists okay to take extra strength tylenol, just no more than 3g daily. Daughter states her father was supposed to have a test done on his legs and that his legs are still bothering him.  Notified that per [facility] PA  I put him on Imdur QD in the AM and arranged for a LEA doppler.  When he came for the doppler he became upset and didn't know why he was having the test.  He left saying he wouldn't be back.    Daughter asking if we could have this test scheduled again because he really needs this done. Asking if L.Kilroy would be present for the test, notified that if PA is in clinic he is unable to leave to be present for the test, however he will be able to see the results once the test is complete. Notified I would send this message to him for review. Daughter verbalized understanding. Also asking about her father's sleep study, notified that these have to go through insurance and may need prior authorization before they can be scheduled. Notified this message would also be sent to sleep coordinator for review. Daughter had no other questions at this time.

## 2019-06-14 NOTE — Telephone Encounter (Signed)
Called and spoke with pt, he gave a verbal okay to discuss all health information with daughter Kirk Ruths now and in the future. Notified pt that per our pharmacist it is okay to take extra strength tylenol  just not to exceed more than 3g daily. Pt states he has already been taking his tylenol and this has helped. Also spoke with pt regarding echo, notified his last Echo was in November of 2020 and we were unsure which echo his daughter was referring to. Pt stated it was okay to review all of this with his daughter. Notified I would call her to clarify. Pt verbalized understanding with no other questions at this time.

## 2019-06-14 NOTE — Telephone Encounter (Signed)
OK to reschedule LEA dopplers for leg pain.  [name] PA-C 06/14/2019 3:03 PM

## 2019-06-14 NOTE — Telephone Encounter (Signed)
Order placed. Message to schedulers to schedule appt.

## 2019-06-14 NOTE — Telephone Encounter (Signed)
[name] states she is requesting to speak with Dr. Landry Dyke nurse regarding whether or not the patient is eligible to take extra strength Tylenol. She states she is also requesting to discuss his echocardiogram results. Please call.

## 2019-06-14 NOTE — Telephone Encounter (Signed)
Yes, ok to use extra strength tylenol. No more than 3g per day

## 2019-06-14 NOTE — Progress Notes (Signed)
Order for VAS [place] LOWER EXTREMITY ARTERIAL DUPLEX placed.

## 2019-06-16 ENCOUNTER — Other Ambulatory Visit: Payer: Self-pay | Admitting: Cardiology

## 2019-06-16 DIAGNOSIS — I739 Peripheral vascular disease, unspecified: Secondary | ICD-10-CM

## 2019-06-16 DIAGNOSIS — M79604 Pain in right leg: Secondary | ICD-10-CM

## 2019-06-19 ENCOUNTER — Telehealth: Payer: Self-pay

## 2019-06-19 NOTE — Telephone Encounter (Signed)
-----   Message from June Leap June, RN sent at 06/02/2019 12:24 PM EDT ----- Please schedule pt for a split night sleep study! Thank you!

## 2019-06-20 NOTE — Telephone Encounter (Signed)
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  He will bring his new card for next appt for vascular on 06/28/2019.    Pt is hesitant about moving forward with the sleep study, he states he has been told he needs it for years and nothing has happened in that time.  He doesn't understand the benefit of having the study as Clinical research associate attempted to explain.  He would like time to think about the study and make a decision.  Pt reports decrease in upper chest discomfort.  Says it's intermittant, also said he is no longer having "choking" during sleep.  Pt states tylenol is helping any discomfort he is having, however he still has feet tingling and leg discomfort.

## 2019-06-27 ENCOUNTER — Encounter: Payer: Self-pay | Admitting: *Deleted

## 2019-06-27 ENCOUNTER — Telehealth: Payer: Self-pay | Admitting: *Deleted

## 2019-06-27 ENCOUNTER — Encounter (HOSPITAL_COMMUNITY): Payer: BC Managed Care – PPO

## 2019-06-27 DIAGNOSIS — Z006 Encounter for examination for normal comparison and control in clinical research program: Secondary | ICD-10-CM

## 2019-06-27 NOTE — Telephone Encounter (Signed)
LMTCB for patient's Visit 9 of the AEGIS study. Will try to contact him later.

## 2019-06-27 NOTE — Research (Signed)
Mr. Keith Newton has done well overall since we last spoke. He is having some pain in his legs that is keeping him from walking as much. He has an appt with Keith Shelter, PA tomorrow to investigate. He reports that his daughter is "fixing his medicines in his weekly pill box so whatever she puts in there, I take". He does state that he takes his ASA, lipitor and Brilinta as prescribed. He does not smoke. Keith Newton reports that he actively tries to eat a heart healthy diet; he is baking chicken instead of frying it. I commended him on his efforts and told him to call us if he should have any questions or concerns.                                    "CONSENT"   YES     NO   Continuing further Investigational Product and study visits for follow-up? [x]  []   Continuing consent from future biomedical research [x]  []                                      "EVENTS"    YES     NO  AE   (IF YES SEE SOURCE) []  [x]   SAE  (IF YES SEE SOURCE) []  [x]   ENDPOINT   (IF YES SEE SOURCE) []  [x]   REVASCULARIZATION  (IF YES SEE SOURCE) []  [x]   AMPUTATION   (IF YES SEE SOURCE) []  [x]   TROPONIN'S  (IF YES SEE SOURCE) []  [x]    Lifestyle Adherence Assessment:    YES NO  Abstinence from smoking/remaining tobacco free x   Cardiac Diet x   Routine physical activity and/or cardiac rehabilitation x     Current Outpatient Medications:  .  acetaminophen (TYLENOL) 325 MG tablet, Take 650 mg by mouth every 6 (six) hours as needed., Disp: , Rfl:  .  aspirin EC 81 MG EC tablet, Take 1 tablet (81 mg total) by mouth daily., Disp:  , Rfl:  .  atorvastatin (LIPITOR) 80 MG tablet, Take 1 tablet (80 mg total) by mouth daily., Disp: 90 tablet, Rfl: 1 .  chlorpheniramine (CHLOR-TRIMETON) 4 MG tablet, Take 4 mg by mouth every 6 (six) hours as needed for allergies., Disp: , Rfl:  .  FARXIGA 10 MG TABS tablet, Take 10 mg by mouth daily., Disp: , Rfl:  .  gabapentin (NEURONTIN) 300 MG capsule, Take 300 mg by mouth at bedtime., Disp: , Rfl:  .   insulin degludec (TRESIBA FLEXTOUCH) 100 UNIT/ML SOPN FlexTouch Pen, Inject 30 Units into the skin daily., Disp: , Rfl:  .  isosorbide mononitrate (IMDUR) 60 MG 24 hr tablet, Take 1 tablet (60 mg total) by mouth daily., Disp: 90 tablet, Rfl: 3 .  metFORMIN (GLUCOPHAGE) 500 MG tablet, Take 500 mg by mouth 2 (two) times daily. , Disp: , Rfl:  .  metoprolol tartrate (LOPRESSOR) 50 MG tablet, Take 1 tablet (50 mg total) by mouth 2 (two) times daily., Disp: 180 tablet, Rfl: 3 .  nitroGLYCERIN (NITROSTAT) 0.4 MG SL tablet, Place 1 tablet (0.4 mg total) under the tongue every 5 (five) minutes x 3 doses as needed for chest pain., Disp: 25 tablet, Rfl: 3 .  ticagrelor (BRILINTA) 90 MG TABS tablet, Take 1 tablet (90 mg total) by mouth 2 (two) times daily., Disp: 180 tablet, Rfl:  3    

## 2019-06-28 ENCOUNTER — Ambulatory Visit (HOSPITAL_COMMUNITY)
Admission: RE | Admit: 2019-06-28 | Discharge: 2019-06-28 | Disposition: A | Discharge status: Home / Self Care | Payer: 59 | Source: Ambulatory Visit | Attending: Cardiovascular Disease | Admitting: Cardiovascular Disease

## 2019-06-28 ENCOUNTER — Other Ambulatory Visit: Payer: Self-pay

## 2019-06-28 DIAGNOSIS — I739 Peripheral vascular disease, unspecified: Secondary | ICD-10-CM

## 2019-06-28 DIAGNOSIS — M79605 Pain in left leg: Secondary | ICD-10-CM | POA: Diagnosis present

## 2019-06-28 DIAGNOSIS — M79604 Pain in right leg: Secondary | ICD-10-CM | POA: Diagnosis present

## 2019-06-30 ENCOUNTER — Telehealth: Payer: Self-pay | Admitting: Cardiology

## 2019-06-30 NOTE — Telephone Encounter (Signed)
LM for pt to call back.. no DPR on file for the pts daughter.

## 2019-06-30 NOTE — Telephone Encounter (Signed)
New message   [name] is returning call for test results. Please call.

## 2019-07-03 NOTE — Telephone Encounter (Signed)
[name] calling back stating the patient had requested a nurse call her and discuss his results. She states she is returning the call of the nurse.

## 2019-07-03 NOTE — Telephone Encounter (Signed)
Spoke to patient's daughter Keith Newton and patient.Patient gave permission for daughter to have his doppler and lab results.Lower ext doppler results given.Doppler ok not the cause of leg pain.Stated he is having pain in both feet.Lab that was done 03/01/19 results given.Advised needs to see PCP.AIC 10.1.Stated he is in the process of changing PCP.He is taking all diabetic medications.Advised of a better diet until he sees new PCP.Advised to sign DPR next time he is in office.

## 2019-07-07 ENCOUNTER — Other Ambulatory Visit: Payer: Self-pay | Admitting: Student

## 2019-08-11 ENCOUNTER — Encounter: Payer: Self-pay | Admitting: Family Medicine

## 2019-08-11 ENCOUNTER — Other Ambulatory Visit: Payer: Self-pay

## 2019-08-11 ENCOUNTER — Ambulatory Visit (INDEPENDENT_AMBULATORY_CARE_PROVIDER_SITE_OTHER): Payer: 59 | Admitting: Family Medicine

## 2019-08-11 VITALS — BP 124/92 | HR 82 | Ht 68.0 in | Wt 256.6 lb

## 2019-08-11 DIAGNOSIS — I10 Essential (primary) hypertension: Secondary | ICD-10-CM

## 2019-08-11 DIAGNOSIS — E118 Type 2 diabetes mellitus with unspecified complications: Secondary | ICD-10-CM | POA: Diagnosis not present

## 2019-08-11 DIAGNOSIS — E1142 Type 2 diabetes mellitus with diabetic polyneuropathy: Secondary | ICD-10-CM

## 2019-08-11 LAB — POCT GLYCOSYLATED HEMOGLOBIN (HGB A1C): HbA1c, POC (controlled diabetic range): 9.4 % — AB (ref 0.0–7.0)

## 2019-08-11 MED ORDER — METFORMIN HCL 1000 MG PO TABS: 1000.0000 mg | ORAL_TABLET | Freq: Two times a day (BID) | ORAL | 0 refills | Status: DC

## 2019-08-11 MED ORDER — PREGABALIN 50 MG PO CAPS: 50.0000 mg | ORAL_CAPSULE | Freq: Three times a day (TID) | ORAL | 1 refills | Status: DC

## 2019-08-11 NOTE — Progress Notes (Signed)
    SUBJECTIVE:   CHIEF COMPLAINT / HPI:   Diabetes: pt states he is compliant with his diabetes medication most of the time but admits to missing some doses.  He can't afford the farxiga refills that were prescribed to him previously.  He states he takes his insulin daily but does not check his blood glucose.    HTN: NSTEMI in November 2020. Sees a cardiologist. Is compliant with his medication.    Leg pain: pt states he has pain/tingling and occasional numbness. in his feet and lower legs bilaterally.  It has been progressively getting worse over a period of several months.  He tried taking gabapentin previously but it did not help his symptoms and made him drowsy.   PERTINENT  PMH / PSH: DM, HTN, MI  OBJECTIVE:   BP (!) 124/92   Pulse 82   Ht 5\' 8"  (1.727 m)   Wt 256 lb 9.6 oz (116.4 kg)   SpO2 97%   BMI 39.02 kg/m   Gen: alert. No acute distress. Obese male, appears stated age.   Heent: EOMI, PERRLA, MOM.  Cv: rrr. No murmurs Pulm: lctab. No wheezes or crackles.  Abd: obese pannus.  No tenderness Msk:  5/5 strength b/l.  Neuro: cn 2-12 grossly intact.  negative straight/cross legged test. Normal sensation in the feet bilaterally.   ASSESSMENT/PLAN:   Type 2 diabetes mellitus with complication (HCC) Pt currently taking metformin 500mg  bid.  He was taking farxiga but ran out and has not been able to afford refills.  a1c today improved but still elevated.   - increase metformin to 1000mg  bid - prescribe sglt2i that is covered by his insurance (likely canagliflozin),  - prescribe glucometer covered by pt's insurance.   Hypertension Discussed w/ pt the importance of controlling HTN.  Continue current home regimen. Systolic bp good today on exam, diastolic is mildly elevated.    Diabetic neuropathy (HCC) Normal foot exam.  Complains of worsening chronic b/l foot paresthesia/anesthesia.  No falls or gait changes.  Did not receive benefit from gabapentin so currently not taking  it.  Will prescribe lyrica.       , MD [facility]

## 2019-08-11 NOTE — Patient Instructions (Addendum)
It was nice to meet you today,  Like we were discussing, it is important to control your diabetes as well as your blood pressure and to take the medicines that the cardiologist prescribed you.  I will increase your Metformin to 1000 mg twice a day.  I will figure out which SGLT2 medication is approved by your insurance and I will do the same thing for your glucometer.  I will start a medication called Lyrica. Take it three times a day.  It should be less sedating than gabapentin.  If you don't feel it is working, please don't stop taking it.  We can increase the dose before we decide to switch to something else.   Have a great day,   Frederic Jericho, MD

## 2019-08-14 ENCOUNTER — Telehealth: Payer: Self-pay

## 2019-08-14 NOTE — Telephone Encounter (Signed)
Received fax from pharmacy, PA needed on Pregablin 50mg . Clinical questions submitted via Cover My Meds. Waiting on response, could take up to 72 hours.  Cover My Meds info: Key: 

## 2019-08-16 DIAGNOSIS — E1140 Type 2 diabetes mellitus with diabetic neuropathy, unspecified: Secondary | ICD-10-CM | POA: Insufficient documentation

## 2019-08-16 NOTE — Assessment & Plan Note (Signed)
Normal foot exam.  Complains of worsening chronic b/l foot paresthesia/anesthesia.  No falls or gait changes.  Did not receive benefit from gabapentin so currently not taking it.  Will prescribe lyrica.

## 2019-08-16 NOTE — Assessment & Plan Note (Signed)
Discussed w/ pt the importance of controlling HTN.  Continue current home regimen. Systolic bp good today on exam, diastolic is mildly elevated.

## 2019-08-16 NOTE — Assessment & Plan Note (Signed)
Pt currently taking metformin 500mg  bid.  He was taking farxiga but ran out and has not been able to afford refills.  a1c today improved but still elevated.   - increase metformin to 1000mg  bid - prescribe sglt2i that is covered by his insurance (likely canagliflozin),  - prescribe glucometer covered by pt's insurance.

## 2019-08-17 MED ORDER — ONETOUCH DELICA LANCETS 33G MISC: 11 refills | Status: DC

## 2019-08-17 MED ORDER — ONETOUCH VERIO VI STRP: ORAL_STRIP | 12 refills | Status: DC

## 2019-08-17 MED ORDER — ONETOUCH DELICA LANCING DEV MISC: 11 refills | Status: DC

## 2019-08-17 MED ORDER — ONETOUCH VERIO IQ SYSTEM W/DEVICE KIT: PACK | 1 refills | Status: DC

## 2019-08-17 NOTE — Telephone Encounter (Signed)
Medication was denied.  Insurance will likely fax and explanation. Jone Baseman, CMA

## 2019-08-17 NOTE — Addendum Note (Signed)
Addended by: Sandre Kitty on: 08/17/2019 08:19 AM   Modules accepted: Orders

## 2019-08-18 ENCOUNTER — Telehealth: Payer: Self-pay | Admitting: Cardiovascular Disease

## 2019-08-18 ENCOUNTER — Telehealth: Payer: Self-pay | Admitting: Family Medicine

## 2019-08-18 ENCOUNTER — Other Ambulatory Visit: Payer: Self-pay | Admitting: Family Medicine

## 2019-08-18 ENCOUNTER — Telehealth: Payer: Self-pay

## 2019-08-18 MED ORDER — CANAGLIFLOZIN 300 MG PO TABS: 300.0000 mg | ORAL_TABLET | Freq: Every day | ORAL | 3 refills | Status: DC

## 2019-08-18 NOTE — Telephone Encounter (Signed)
Can we call this patient to tell him that I have sent in a prescription for canagliflozin, which he will use instead of farxiga.  I have also sent him in a prescription for glucose supplies that should be covered by his insurance.  Also please let him know that I am working with his insurance company to try to get them to pay for the medicine to treat his foot pain.

## 2019-08-18 NOTE — Telephone Encounter (Signed)
LVM (detailed) with no identifying information. Keith Newton, CMA

## 2019-08-18 NOTE — Telephone Encounter (Signed)
[facility] from [facility]: Rx for Pregabalin (Lyrica) 50 mg capsules Missing/illegible information on Rx: Needs clarification: Prior authorization denied.  Sunday Spillers, CMA

## 2019-08-18 NOTE — Telephone Encounter (Signed)
LVM for patient to return call to get appointment scheduled from patients recall lists for follow up visit.

## 2019-08-24 NOTE — Telephone Encounter (Signed)
I faxed the information they requested a few days ago.  Is this denial after they've received the documentation or before?

## 2019-08-30 ENCOUNTER — Encounter: Payer: Self-pay | Admitting: Family Medicine

## 2019-08-30 ENCOUNTER — Other Ambulatory Visit: Payer: Self-pay

## 2019-08-30 ENCOUNTER — Ambulatory Visit (INDEPENDENT_AMBULATORY_CARE_PROVIDER_SITE_OTHER): Payer: 59 | Admitting: Family Medicine

## 2019-08-30 DIAGNOSIS — E1142 Type 2 diabetes mellitus with diabetic polyneuropathy: Secondary | ICD-10-CM | POA: Diagnosis not present

## 2019-08-30 DIAGNOSIS — M19011 Primary osteoarthritis, right shoulder: Secondary | ICD-10-CM

## 2019-08-30 DIAGNOSIS — E118 Type 2 diabetes mellitus with unspecified complications: Secondary | ICD-10-CM

## 2019-08-30 MED ORDER — PREGABALIN 50 MG PO CAPS: 50.0000 mg | ORAL_CAPSULE | Freq: Three times a day (TID) | ORAL | 1 refills | Status: DC

## 2019-08-30 MED ORDER — ONETOUCH VERIO IQ SYSTEM W/DEVICE KIT: PACK | 1 refills | Status: DC

## 2019-08-30 NOTE — Progress Notes (Signed)
    SUBJECTIVE:   CHIEF COMPLAINT / HPI:   Diabetes: patient just started his canagliflozin yesterday.  He still takes his insulin daily and takes his metformin. He has not been checking his blood glucose because the pharmacy did not give him the meter, only the strips and lancets.   Neuropathy: he feels pins and needles in both his feet and starting to feel it in his fingers. He has had surgery for carpal tunnel years ago. He has not picked up his lyrica because the price was too expensive.    Shoulder pain: the patient is having pain in his right shoulder that travels up his neck and down his humerus. It's a dull ache. There is no midline neck pain. No weakness in his arms. The pain is chronic and waxes and wanes and sometimes occurs In the right shoulder as well.    PERTINENT  PMH / PSH: diabetes, carpal tunnel, htn  OBJECTIVE:   BP 122/62   Pulse 75   Wt 255 lb 3.2 oz (115.8 kg)   SpO2 97%   BMI 38.80 kg/m   Gen: alert. No acute distress. Granddaughter is with him.   Cv: RRR. No murmurs.   Pulm: LCTAB. No wheezes MSK: shoulder strength 4/5 in right, 5/5 in left. Cross-arm test positive for pain at [facility] joint on right shoulder.  Hawkins test positive for soreness at the ac joint on the right side; pain is less severe than cross arm test.   ASSESSMENT/PLAN:   AC (acromioclavicular) joint arthritis Chronic aching pain that waxes/wanes in intensity on right side. Physical exam localizes pain to the [facility] joint.  No radiculopathy symptoms.  Not a good candidate for NSAIDs or oral corticosteroids.  Discussed with patient the limited treatment options.  We will schedule him for an appt next week and do a steroid injection into the shoulder. After that will refer pt to PT for rehab exercises.   Type 2 diabetes mellitus with complication (HCC) Just started canigloflozin. Still has not received his glucometer.  Will resend to pharmacy.  Unsure why it was not given to him after last visit.  Once  he is checking his home blood glucose we can start to titrate insulin as needed.   Diabetic neuropathy (HCC) Has not picked up lyrica due to issue with insurance not covering the cost. Will resend Rx to [employer] with goodrx coupon that will make medicaation affordable even if insurance continues to refuse to cover the medication. I have previously spoken with the insurance company and was told it has been approved.       Sandre Kitty, MD [facility]

## 2019-08-30 NOTE — Patient Instructions (Signed)
I have included a coupon from good Rx for Lyrica at [employer].  I would also try calling the [facility] to see if the price is changed for your Lyrica.  I have also sent the glucometer device [facility].  If you have any issues with getting it just call us and let us know.  Next Wednesday I will see you again so we can inject your shoulder with a local steroid and then we will prescribe physical therapy.  Have a great day,  Frederic Jericho, MD

## 2019-09-01 ENCOUNTER — Other Ambulatory Visit: Payer: Self-pay | Admitting: Family Medicine

## 2019-09-02 DIAGNOSIS — G8929 Other chronic pain: Secondary | ICD-10-CM | POA: Insufficient documentation

## 2019-09-02 DIAGNOSIS — M19019 Primary osteoarthritis, unspecified shoulder: Secondary | ICD-10-CM | POA: Insufficient documentation

## 2019-09-02 NOTE — Assessment & Plan Note (Signed)
Has not picked up lyrica due to issue with insurance not covering the cost. Will resend Rx to [employer] with goodrx coupon that will make medicaation affordable even if insurance continues to refuse to cover the medication. I have previously spoken with the insurance company and was told it has been approved.

## 2019-09-02 NOTE — Assessment & Plan Note (Signed)
Chronic aching pain that waxes/wanes in intensity on right side. Physical exam localizes pain to the [facility] joint.  No radiculopathy symptoms.  Not a good candidate for NSAIDs or oral corticosteroids.  Discussed with patient the limited treatment options.  We will schedule him for an appt next week and do a steroid injection into the shoulder. After that will refer pt to PT for rehab exercises.

## 2019-09-02 NOTE — Assessment & Plan Note (Signed)
Just started canigloflozin. Still has not received his glucometer.  Will resend to pharmacy.  Unsure why it was not given to him after last visit.  Once he is checking his home blood glucose we can start to titrate insulin as needed.

## 2019-09-06 ENCOUNTER — Ambulatory Visit (INDEPENDENT_AMBULATORY_CARE_PROVIDER_SITE_OTHER): Payer: 59 | Admitting: Family Medicine

## 2019-09-06 ENCOUNTER — Encounter: Payer: Self-pay | Admitting: Family Medicine

## 2019-09-06 ENCOUNTER — Other Ambulatory Visit: Payer: Self-pay

## 2019-09-06 VITALS — BP 110/72 | HR 85 | Wt 252.4 lb

## 2019-09-06 DIAGNOSIS — M19011 Primary osteoarthritis, right shoulder: Secondary | ICD-10-CM

## 2019-09-06 MED ORDER — METHYLPREDNISOLONE ACETATE 40 MG/ML IJ SUSP
40.0000 mg | Freq: Once | INTRAMUSCULAR | Status: AC
  Administered 2019-09-06: 40 mg via INTRAMUSCULAR

## 2019-09-06 NOTE — Progress Notes (Signed)
    SUBJECTIVE:   CHIEF COMPLAINT / HPI:   AC joint arthritis: patient here today to get an injection for his shoulder pain.  Pain is still present in his right shoulder.  We discussed the risks of the procedure including infection and bleeding, the expectations that he will hopefully have improvement of pain in the injected area and how the duration of effectiveness varies from person to person.  Pt is willing to go to physical therapy after the injection if his shoulder pain improves.   PERTINENT  PMH / PSH: DM, arthritis, diabetic neuropathy.    OBJECTIVE:   BP 110/72   Pulse 85   Wt 252 lb 6.4 oz (114.5 kg)   SpO2 97%   BMI 38.38 kg/m   Gen: alert. No acute distress.  MSK: decreased active ROM in right shoulder.  Range of motion significantly improved on recheck after injection.    ASSESSMENT/PLAN:   No problem-specific Assessment & Plan notes found for this encounter.   PROCEDURE: INJECTION: Patient was given informed consent, signed copy in the chart. Appropriate time out was taken. Area prepped and draped in usual sterile fashion. Ethyl chloride was  used for local anesthesia. A 21 gauge 1 1/2 inch needle was used.. 1 cc of methylprednisolone 40 mg/ml plus  4 cc of 1% lidocaine without epinephrine was injected into the right posterior shoulder beneath the acromion.   The patient tolerated the procedure well. There were no complications. Post procedure instructions were given.  Sandre Kitty, MD [facility]

## 2019-09-06 NOTE — Patient Instructions (Signed)
It was nice to see you today,  Hopefully the shoulder injection will give you some relief of your shoulder pain.  It should start to feel better over the next few hours.  If you start to develop any shortness of breath or fast heartbeat, you should seek medical attention.  As Dr. Deirdre Priest said, there is a chance it may feel worse tomorrow but the following day should improve.  I will refer you to a physical therapist.  Someone will call you to schedule an appointment within the next week.  If your arm is feeling better it would be a good time to go to physical therapy so that they can help with stretching and strengthening exercises.  Have a great day,  Frederic Jericho, MD

## 2019-09-07 NOTE — Assessment & Plan Note (Signed)
Pt here for steroid injection of right shoulder.  Patient tolerated procedure well.  He had great improvement in pain after his injection.  PT referral placed and pt was told to inform us if he had not heard from them in a week.  Return precautions discussed.

## 2019-09-28 ENCOUNTER — Encounter: Payer: Self-pay | Admitting: *Deleted

## 2019-09-28 DIAGNOSIS — Z006 Encounter for examination for normal comparison and control in clinical research program: Secondary | ICD-10-CM

## 2019-10-02 NOTE — Research (Addendum)
[facility] Visit 10 phone call   Patient is having a lot of problems with his arthritic joints; He has had a shoulder injected recently by Dr. Jeannine Kitten. He is waiting on physical therapy appt to be made according to Dr. Richardson Landry last note (09-06-2019). This arthritic pain is stopping him from doing any exercise or walking at this time.He is still taking his prescribed medications and is still trying his best to eat a heart healthy diet; although he does attest to eating some fried foods.                                      "CONSENT"   YES     NO   Continuing further Investigational Product and study visits for follow-up? [x]  []   Continuing consent from future biomedical research [x]  []                                      "EVENTS"    YES     NO  AE   (IF YES SEE SOURCE) []  [x]   SAE  (IF YES SEE SOURCE) []  [x]   ENDPOINT   (IF YES SEE SOURCE) []  [x]   REVASCULARIZATION  (IF YES SEE SOURCE) []  [x]   AMPUTATION   (IF YES SEE SOURCE) []  [x]   TROPONIN'S  (IF YES SEE SOURCE) []  [x]     Current Outpatient Medications:  .  acetaminophen (TYLENOL) 325 MG tablet, Take 650 mg by mouth every 6 (six) hours as needed., Disp: , Rfl:  .  aspirin EC 81 MG EC tablet, Take 1 tablet (81 mg total) by mouth daily., Disp:  , Rfl:  .  atorvastatin (LIPITOR) 80 MG tablet, TAKE 1 TABLET BY MOUTH EVERY DAY, Disp: 90 tablet, Rfl: 1 .  Blood Glucose Monitoring Suppl (ONETOUCH VERIO IQ SYSTEM) w/Device KIT, Check fasting blood glucose in AM and before giving insulin., Disp: 1 kit, Rfl: 1 .  canagliflozin (INVOKANA) 300 MG TABS tablet, Take 1 tablet (300 mg total) by mouth daily., Disp: 90 tablet, Rfl: 3 .  chlorpheniramine (CHLOR-TRIMETON) 4 MG tablet, Take 4 mg by mouth every 6 (six) hours as needed for allergies., Disp: , Rfl:  .  glucose blood (ONETOUCH VERIO) test strip, Use as instructed, Disp: 100 each, Rfl: 12 .  insulin degludec (TRESIBA FLEXTOUCH) 100 UNIT/ML SOPN FlexTouch Pen, Inject 40 Units into the skin daily.,  Disp: , Rfl:  .  Lancet Devices (ONE TOUCH DELICA LANCING DEV) MISC, Check fasting blood glucose in AM and before giving insulin., Disp: 1 each, Rfl: 11 .  metFORMIN (GLUCOPHAGE) 1000 MG tablet, TAKE 1 TABLET BY MOUTH TWICE A DAY, Disp: 180 tablet, Rfl: 1 .  metoprolol tartrate (LOPRESSOR) 50 MG tablet, Take 1 tablet (50 mg total) by mouth 2 (two) times daily., Disp: 180 tablet, Rfl: 3 .  nitroGLYCERIN (NITROSTAT) 0.4 MG SL tablet, Place 1 tablet (0.4 mg total) under the tongue every 5 (five) minutes x 3 doses as needed for chest pain., Disp: 25 tablet, Rfl: 3 .  OneTouch Delica Lancets 74Y MISC, Check fasting blood glucose in AM and before giving insulin., Disp: 100 each, Rfl: 11 .  pregabalin (LYRICA) 50 MG capsule, Take 1 capsule (50 mg total) by mouth 3 (three) times daily., Disp: 90 capsule, Rfl: 1 .  ticagrelor (BRILINTA) 90 MG TABS tablet, Take 1  tablet (90 mg total) by mouth 2 (two) times daily., Disp: 180 tablet, Rfl: 3 .  isosorbide mononitrate (IMDUR) 60 MG 24 hr tablet, Take 1 tablet (60 mg total) by mouth daily., Disp: 90 tablet, Rfl: 3

## 2019-10-05 NOTE — Telephone Encounter (Signed)
Called [facility], pregabalin was picked up on 7/14. Sunday Spillers, CMA

## 2019-10-17 ENCOUNTER — Ambulatory Visit: Payer: 59 | Attending: Family Medicine

## 2019-10-17 ENCOUNTER — Other Ambulatory Visit: Payer: Self-pay

## 2019-10-17 DIAGNOSIS — R252 Cramp and spasm: Secondary | ICD-10-CM | POA: Insufficient documentation

## 2019-10-17 DIAGNOSIS — G8929 Other chronic pain: Secondary | ICD-10-CM | POA: Insufficient documentation

## 2019-10-17 DIAGNOSIS — M436 Torticollis: Secondary | ICD-10-CM | POA: Insufficient documentation

## 2019-10-17 DIAGNOSIS — R293 Abnormal posture: Secondary | ICD-10-CM | POA: Insufficient documentation

## 2019-10-17 DIAGNOSIS — M6281 Muscle weakness (generalized): Secondary | ICD-10-CM | POA: Insufficient documentation

## 2019-10-17 DIAGNOSIS — M25511 Pain in right shoulder: Secondary | ICD-10-CM | POA: Insufficient documentation

## 2019-10-17 DIAGNOSIS — M25512 Pain in left shoulder: Secondary | ICD-10-CM | POA: Insufficient documentation

## 2019-10-17 NOTE — Therapy (Signed)
[facility] [address] [place], [place], [address] Phone: [phone]   Fax:  [phone]  Physical Therapy Evaluation  Patient Details  Name: Keith Newton MRN: [id number] Date of Birth: 15-Sep-1955 Referring Provider (PT): Janit PaganKehinde Eniola , MD   Encounter Date: 10/17/2019   PT End of Session - 10/17/19 1700    Visit Number 1    Number of Visits 12    Date for PT Re-Evaluation 11/24/19    Authorization Type [employer]    PT Start Time 0415    PT Stop Time 0450    PT Time Calculation (min) 35 min    Activity Tolerance Patient tolerated treatment well;No increased pain;Patient limited by pain    Behavior During Therapy [facility] for tasks assessed/performed           Past Medical History:  Diagnosis Date  . Arthritis   . Back pain   . CAD (coronary artery disease)    a. s/p NSTEMI in 12/2018 with 80% Prox-RCA stenosis followed by 100% prox to mid-RCA stenosis --> DESx2 to the RCA. Med management of residual disease.   . Carpal tunnel syndrome   . DDD (degenerative disc disease)   . Diabetes mellitus   . Hyperlipidemia   . Hypertension    states very mild  . Low testosterone   . Overweight   . Seasonal allergies   . Tachycardia     Past Surgical History:  Procedure Laterality Date  . CARDIAC CATHETERIZATION    . CARPAL TUNNEL RELEASE  07/02/2011   Procedure: CARPAL TUNNEL RELEASE;  Surgeon: Wyn Forsterobert V Sypher Jr., MD;  Location: [facility];  Service: Orthopedics;  Laterality: Right;  . CARPAL TUNNEL RELEASE  07/30/2011   Procedure: CARPAL TUNNEL RELEASE;  Surgeon: Wyn Forsterobert V Sypher Jr., MD;  Location: [facility];  Service: Orthopedics;  Laterality: Left;  . CORONARY ANGIOPLASTY    . CORONARY STENT INTERVENTION N/A 12/23/2018   Procedure: CORONARY STENT INTERVENTION;  Surgeon: Corky CraftsVaranasi, Jayadeep S, MD;  [facility];  Service: Cardiovascular;  Laterality: N/A;  . LEFT HEART  CATH AND CORONARY ANGIOGRAPHY N/A 12/23/2018   Procedure: LEFT HEART CATH AND CORONARY ANGIOGRAPHY;  Surgeon: Corky CraftsVaranasi, Jayadeep S, MD;  [facility];  Service: Cardiovascular;  Laterality: N/A;  . LUMBAR EPIDURAL INJECTION     x2    There were no vitals filed for this visit.    Subjective Assessment - 10/17/19 1621    Subjective MD sent me here for pain in RT shoulder. He is post injection without benefit.      I don't want to do any  exercise. Do alot of yard work.    Limitations --   Does everything I want but with pain.   Diagnostic tests 2014 Xray and MRI; Torn tendons and OA    Patient Stated Goals None    Currently in Pain? Yes   only when he moves it   Pain Score 7     Pain Location Shoulder    Pain Orientation Right    Pain Descriptors / Indicators Aching;Throbbing    Pain Type Chronic pain    Pain Onset More than a month ago    Pain Frequency Constant    Aggravating Factors  usiing arm.    Pain Relieving Factors Medication, massage machine    Effect of Pain on Daily Activities wakes at night  [facility] PT Assessment - 10/17/19 0001      Assessment   Medical Diagnosis RT shoulder pain and AC joint OA    Referring Provider (PT) Janit Pagan , MD    Onset Date/Surgical Date --   -8 years ago. injured with alot of lifting   Prior Therapy years ago was using bands and had somehthing happened and PT ws stopped.        Precautions   Precautions None      Restrictions   Weight Bearing Restrictions No      Balance Screen   Has the patient fallen in the past 6 months No      Prior Function   Level of Independence Independent      Cognition   Overall Cognitive Status Within Functional Limits for tasks assessed      Posture/Postural Control   Posture Comments forward head , rounded shoulders.       ROM / Strength   AROM / PROM / Strength AROM;PROM;Strength      AROM   AROM Assessment Site Shoulder;Cervical    Right/Left Shoulder  Right;Left    Right Shoulder Flexion 110 Degrees    Right Shoulder ABduction 155 Degrees    Right Shoulder Internal Rotation 20 Degrees    Right Shoulder External Rotation 90 Degrees    Left Shoulder Flexion 160 Degrees    Left Shoulder ABduction 110 Degrees   pain   Left Shoulder Internal Rotation 50 Degrees    Left Shoulder External Rotation 60 Degrees    Cervical Flexion 50    Cervical Extension 40   incr pain   Cervical - Right Side Bend 30    Cervical - Left Side Bend 25    Cervical - Right Rotation 60    Cervical - Left Rotation 30      PROM   Overall PROM Comments WNL bilaterally with arms belw shoulder level      Strength   Strength Assessment Site Shoulder                      Objective measurements completed on examination: See above findings.               PT Education - 10/17/19 1711    Education Details POC , posture education of scapula position to decr impinement and cervical posture to decr pressure to cervical joints    Person(s) Educated Patient;Child(ren)    Methods Explanation;Demonstration;Tactile cues;Verbal cues    Comprehension Verbalized understanding;Returned demonstration            PT Short Term Goals - 10/17/19 1612      PT SHORT TERM GOAL #1   Title He will be indppendent with intial HEP    Time 3    Period Weeks    Status New      PT SHORT TERM GOAL #2   Title He will have full active ROm RT shoulder    Time 3    Period Weeks    Status New      PT SHORT TERM GOAL #3   Title He will report pain decreased 30-40% or more in RT shoulder with self care    Time 3    Period Weeks    Status New      PT SHORT TERM GOAL #4   Title He will be able to demo god posture of neck and shoulders    Time 3    Period Weeks  Status New             PT Long Term Goals - 10/17/19 1614      PT LONG TERM GOAL #1   Title He will be indpendent with all hEP issued    Time 6    Period Weeks    Status New      PT  LONG TERM GOAL #2   Title His neck ROM will improve to be equal RT to LT    Time 6    Period Weeks    Status New      PT LONG TERM GOAL #3   Title He will report able to use his RT arm full ROM overhead for yard work  with mn to mod pain max pain    Time 6    Period Weeks    Status New      PT LONG TERM GOAL #4   Title He will report neck pain decreased 50% or more    Time 6    Period Weeks    Status New                  Plan - 10/17/19 1701    Clinical Impression Statement Mr Antrim presents with chronic neck and shoulder pain. He had PT in past and did not benefit( he said he was released too soon). He demo decreased ROM and pain with all movements of RT and LT shoulder and limited neck motion with moe pain with extension movement. He has normal strength in shoulders when testedbelow hsoulder height as he reports being very active with yard work.  Old MRI f neck and xrays from 2014 showed degenerative changes in neck and some tendon tears of RTC. He has not had SX. He was reluctant to be in PT reporting he did not think it would help. prognosis of success in not good but itappears we can improve his neck pain and improve use of his shoulders with skilled PT and consistent HEP    Personal Factors and Comorbidities Past/Current Experience;Age;Time since onset of injury/illness/exacerbation;Fitness    Examination-Activity Limitations Reach Overhead;Carry;Lift;Dressing    Examination-Participation Restrictions Cleaning;Community Activity;Yard Work    Conservation officer, historic buildings Stable/Uncomplicated    Clinical Decision Making Low    Rehab Potential Good    PT Frequency 2x / week    PT Duration 6 weeks    PT Treatment/Interventions Passive range of motion;Dry needling;Manual techniques;Patient/family education;Therapeutic activities;Therapeutic exercise;Electrical Stimulation;Iontophoresis 4mg /ml Dexamethasone;Moist Heat;Traction    PT Next Visit Plan initiate HEP, review  posture, manual and modlaities for neck. band or isometrics for sholder /neck    PT Home Exercise Plan posture awareness with chin tucked na dshoulders back    Consulted and Agree with Plan of Care Patient           Patient will benefit from skilled therapeutic intervention in order to improve the following deficits and impairments:  Pain, Postural dysfunction, Decreased range of motion, Increased muscle spasms, Impaired UE functional use  Visit Diagnosis: Muscle weakness (generalized)  Abnormal posture  Stiffness of neck  Cramp and spasm  Chronic left shoulder pain  Chronic right shoulder pain     Problem List Patient Active Problem List   Diagnosis Date Noted  . AC (acromioclavicular) joint arthritis 09/02/2019  . Diabetic neuropathy (HCC) 08/16/2019  . Chest pain of uncertain etiology 04/27/2019  . CAD -S/P PCI 01/02/2019  . Obesity (BMI 30-39.9) 01/02/2019  . Orthostatic dizziness 01/02/2019  . Hyperlipidemia  LDL goal <70 12/25/2018  . History of non-ST elevation myocardial infarction (NSTEMI) 12/23/2018  . Type 2 diabetes mellitus with complication (HCC)   . Tachycardia   . Hypertension     Caprice Red  PT 10/17/2019, 5:13 PM  [facility] [address] [place], [place], [address] Phone: [phone]   Fax:  [phone]  Name: Keith Newton MRN: [id number] Date of Birth: 1955/03/08

## 2019-10-26 ENCOUNTER — Encounter: Payer: Self-pay | Admitting: Physical Therapy

## 2019-10-26 ENCOUNTER — Other Ambulatory Visit: Payer: Self-pay

## 2019-10-26 ENCOUNTER — Telehealth: Payer: Self-pay

## 2019-10-26 ENCOUNTER — Ambulatory Visit: Payer: 59 | Attending: Family Medicine | Admitting: Physical Therapy

## 2019-10-26 DIAGNOSIS — G8929 Other chronic pain: Secondary | ICD-10-CM | POA: Diagnosis present

## 2019-10-26 DIAGNOSIS — M436 Torticollis: Secondary | ICD-10-CM

## 2019-10-26 DIAGNOSIS — M6281 Muscle weakness (generalized): Secondary | ICD-10-CM | POA: Diagnosis present

## 2019-10-26 DIAGNOSIS — M25511 Pain in right shoulder: Secondary | ICD-10-CM | POA: Insufficient documentation

## 2019-10-26 DIAGNOSIS — M25512 Pain in left shoulder: Secondary | ICD-10-CM | POA: Insufficient documentation

## 2019-10-26 DIAGNOSIS — R252 Cramp and spasm: Secondary | ICD-10-CM | POA: Diagnosis present

## 2019-10-26 DIAGNOSIS — R293 Abnormal posture: Secondary | ICD-10-CM | POA: Diagnosis present

## 2019-10-26 NOTE — Therapy (Signed)
[facility] [address] [place], [place], [address] Phone: [phone]   Fax:  [phone]  Physical Therapy Treatment  Patient Details  Name: Keith Newton MRN: [id number] Date of Birth: Jul 24, 1955 Referring Provider (PT): Janit Pagan , MD   Encounter Date: 10/26/2019   PT End of Session - 10/26/19 1436    Visit Number 2    Number of Visits 12    Date for PT Re-Evaluation 11/24/19    Authorization Type [employer]    PT Start Time [phone]   patient 7 minutes late   PT Stop Time 1015    PT Time Calculation (min) 38 min    Activity Tolerance Patient limited by pain    Behavior During Therapy Flat affect           Past Medical History:  Diagnosis Date  . Arthritis   . Back pain   . CAD (coronary artery disease)    a. s/p NSTEMI in 12/2018 with 80% Prox-RCA stenosis followed by 100% prox to mid-RCA stenosis --> DESx2 to the RCA. Med management of residual disease.   . Carpal tunnel syndrome   . DDD (degenerative disc disease)   . Diabetes mellitus   . Hyperlipidemia   . Hypertension    states very mild  . Low testosterone   . Overweight   . Seasonal allergies   . Tachycardia     Past Surgical History:  Procedure Laterality Date  . CARDIAC CATHETERIZATION    . CARPAL TUNNEL RELEASE  07/02/2011   Procedure: CARPAL TUNNEL RELEASE;  Surgeon: Wyn Forster., MD;  Location: [facility];  Service: Orthopedics;  Laterality: Right;  . CARPAL TUNNEL RELEASE  07/30/2011   Procedure: CARPAL TUNNEL RELEASE;  Surgeon: Wyn Forster., MD;  Location: [facility];  Service: Orthopedics;  Laterality: Left;  . CORONARY ANGIOPLASTY    . CORONARY STENT INTERVENTION N/A 12/23/2018   Procedure: CORONARY STENT INTERVENTION;  Surgeon: Corky Crafts, MD;  Location: [facility] INVASIVE CV LAB;  Service: Cardiovascular;  Laterality: N/A;  . LEFT HEART CATH AND CORONARY ANGIOGRAPHY N/A 12/23/2018    Procedure: LEFT HEART CATH AND CORONARY ANGIOGRAPHY;  Surgeon: Corky Crafts, MD;  Location: [facility]VASIVE CV LAB;  Service: Cardiovascular;  Laterality: N/A;  . LUMBAR EPIDURAL INJECTION     x2    There were no vitals filed for this visit.   Subjective Assessment - 10/26/19 1426    Subjective Patient reports no change since the last visit. He woke up this morning with significant neck pain.    Diagnostic tests 2014 Xray and MRI; Torn tendons and OA    Patient Stated Goals None    Currently in Pain? Yes    Pain Score 8     Pain Location Shoulder    Pain Orientation Right    Pain Descriptors / Indicators Aching    Pain Type Chronic pain    Pain Onset More than a month ago    Pain Frequency Constant    Aggravating Factors  using the arm    Pain Relieving Factors medication, massage machine    Effect of Pain on Daily Activities wakes at night                             [facility] PT Treatment/Exercise - 10/26/19 0001      Exercises   Exercises Shoulder  Shoulder Exercises: Supine   External Rotation Limitations PROM into ER with cuing for pain free range     Flexion Limitations wand flexion x210 in pain free range max cuing to keep pain free     Other Supine Exercises chest press 2x10       Manual Therapy   Manual Therapy Joint mobilization;Soft tissue mobilization;Manual Traction    Joint Mobilization grade 1 and 2 PA and AP mobilizations of the right shoulder     Soft tissue mobilization to the upper trarp, psoterior shoulder,     Manual Traction to cervical spine                   PT Education - 10/26/19 1429    Education Details reviewed HEP symptom mangement    Person(s) Educated Patient    Methods Explanation;Demonstration;Tactile cues;Verbal cues    Comprehension Verbalized understanding;Returned demonstration;Verbal cues required;Tactile cues required            PT Short Term Goals - 10/17/19 1612      PT SHORT TERM GOAL  #1   Title He will be indppendent with intial HEP    Time 3    Period Weeks    Status New      PT SHORT TERM GOAL #2   Title He will have full active ROm RT shoulder    Time 3    Period Weeks    Status New      PT SHORT TERM GOAL #3   Title He will report pain decreased 30-40% or more in RT shoulder with self care    Time 3    Period Weeks    Status New      PT SHORT TERM GOAL #4   Title He will be able to demo god posture of neck and shoulders    Time 3    Period Weeks    Status New             PT Long Term Goals - 10/17/19 1614      PT LONG TERM GOAL #1   Title He will be indpendent with all hEP issued    Time 6    Period Weeks    Status New      PT LONG TERM GOAL #2   Title His neck ROM will improve to be equal RT to LT    Time 6    Period Weeks    Status New      PT LONG TERM GOAL #3   Title He will report able to use his RT arm full ROM overhead for yard work  with mn to mod pain max pain    Time 6    Period Weeks    Status New      PT LONG TERM GOAL #4   Title He will report neck pain decreased 50% or more    Time 6    Period Weeks    Status New                 Plan - 10/26/19 4627    Clinical Impression Statement Patient had low tolerance to light touch, passive ROM, and active asstive ROM. He was able to do awand press. Passive motion of the right shoulder hurt his left shoulder. He was able to complete a chest press without much pain. Patient will likely need signiifcant cuing and encouragment to perfrom exercises and to perfrom them correctly. Patient inquired about muscle relaxers.  Therapy adivsed patient that medication management is between him and his docotor and that we do not reccomend muslce relaxers unless prescribed by MD.    Personal Factors and Comorbidities Past/Current Experience;Age;Time since onset of injury/illness/exacerbation;Fitness    Examination-Activity Limitations Reach Overhead;Carry;Lift;Dressing     Examination-Participation Restrictions Cleaning;Community Activity;Yard Work    Conservation officer, historic buildings Stable/Uncomplicated    Clinical Decision Making Low    Rehab Potential Good    PT Frequency 2x / week    PT Duration 6 weeks    PT Treatment/Interventions Passive range of motion;Dry needling;Manual techniques;Patient/family education;Therapeutic activities;Therapeutic exercise;Electrical Stimulation;Iontophoresis 4mg /ml Dexamethasone;Moist Heat;Traction    PT Next Visit Plan initiate HEP, review posture, manual and modlaities for neck. band or isometrics for sholder /neck    PT Home Exercise Plan posture awareness with chin tucked na dshoulders back    Consulted and Agree with Plan of Care Patient           Patient will benefit from skilled therapeutic intervention in order to improve the following deficits and impairments:  Pain, Postural dysfunction, Decreased range of motion, Increased muscle spasms, Impaired UE functional use  Visit Diagnosis: Muscle weakness (generalized)  Abnormal posture  Stiffness of neck  Cramp and spasm  Chronic left shoulder pain  Chronic right shoulder pain     Problem List Patient Active Problem List   Diagnosis Date Noted  . AC (acromioclavicular) joint arthritis 09/02/2019  . Diabetic neuropathy (HCC) 08/16/2019  . Chest pain of uncertain etiology 04/27/2019  . CAD -S/P PCI 01/02/2019  . Obesity (BMI 30-39.9) 01/02/2019  . Orthostatic dizziness 01/02/2019  . Hyperlipidemia LDL goal <70 12/25/2018  . History of non-ST elevation myocardial infarction (NSTEMI) 12/23/2018  . Type 2 diabetes mellitus with complication (HCC)   . Tachycardia   . Hypertension     13/07/2018 PT DPT  10/26/2019, 2:37 PM  [facility] [address] [place], [place], [place] Phone: [phone]   Fax:  [phone]  Name: Keith Newton MRN: [name] Date of Birth: October 11, 1955

## 2019-10-26 NOTE — Telephone Encounter (Signed)
Received phone call from patient's daughter regarding continued pain with physical therapy. Patient is requesting a muscle relaxer to help with pain management.   To PCP  Veronda Prude, RN

## 2019-10-27 ENCOUNTER — Other Ambulatory Visit: Payer: Self-pay | Admitting: Family Medicine

## 2019-10-27 ENCOUNTER — Other Ambulatory Visit: Payer: Self-pay

## 2019-10-27 MED ORDER — BACLOFEN 5 MG PO TABS: 5.0000 mg | ORAL_TABLET | Freq: Every evening | ORAL | 1 refills | Status: DC | PRN

## 2019-10-27 MED ORDER — BACLOFEN 5 MG PO TABS
5.0000 mg | ORAL_TABLET | Freq: Every evening | ORAL | 1 refills | Status: DC | PRN
Start: 2019-10-27 — End: 2019-10-27

## 2019-10-27 NOTE — Telephone Encounter (Signed)
Talked to pt regarding pain.  Will send in Rx for trial of nightly baclofen.

## 2019-10-27 NOTE — Telephone Encounter (Signed)
Patient daughter calls nurse line stating his insurance will not cover Baclofen. Daughter reports she will use a goodRx card, however the cheapest price is at [facility] [employer]. I have cancelled out the baclofen at [facility] and resent to [employer] [employer] on [address].

## 2019-10-27 NOTE — Progress Notes (Signed)
Pt calling in regards to shoulder pain since starting PT.  States it hurts most of the night and into the morning.  Worse after days he's had PT.  Pain has started traveling up his neck and and down his back.  Discussed muscle relaxants with the patient and how they are unlikely to work if it is his arthritis.  If this is from muscle spasms due to his exercises it may be of some benefit.  Will send in prescription for nightly 5mg  baclofen as needed.  Pt advised to continue taking other pain medications such as tylenol during the day for pain.

## 2019-10-30 MED ORDER — PREGABALIN 50 MG PO CAPS
50.0000 mg | ORAL_CAPSULE | Freq: Three times a day (TID) | ORAL | 1 refills | Status: DC
Start: 2019-10-30 — End: 2019-12-26

## 2019-10-31 ENCOUNTER — Ambulatory Visit: Payer: 59

## 2019-10-31 ENCOUNTER — Other Ambulatory Visit: Payer: Self-pay

## 2019-10-31 DIAGNOSIS — M25512 Pain in left shoulder: Secondary | ICD-10-CM

## 2019-10-31 DIAGNOSIS — R252 Cramp and spasm: Secondary | ICD-10-CM

## 2019-10-31 DIAGNOSIS — G8929 Other chronic pain: Secondary | ICD-10-CM

## 2019-10-31 DIAGNOSIS — M6281 Muscle weakness (generalized): Secondary | ICD-10-CM

## 2019-10-31 DIAGNOSIS — R293 Abnormal posture: Secondary | ICD-10-CM

## 2019-10-31 DIAGNOSIS — M436 Torticollis: Secondary | ICD-10-CM

## 2019-10-31 NOTE — Patient Instructions (Signed)
Supine active flexion Earleen Reaper abduction/IR /ER x 10-20 reps daily RT/Lt

## 2019-10-31 NOTE — Therapy (Signed)
[facility] [address] [place], [place], [address] Phone: [phone]   Fax:  [phone]  Physical Therapy Treatment  Patient Details  Name: Keith Newton MRN: [id number] Date of Birth: 1955/06/17 Referring Provider (PT): Janit Pagan , MD   Encounter Date: 10/31/2019   PT End of Session - 10/31/19 1010    Visit Number 3    Number of Visits 12    Date for PT Re-Evaluation 11/24/19    Authorization Type [employer]    PT Start Time 1010   pt late   PT Stop Time 1045    PT Time Calculation (min) 35 min    Activity Tolerance Patient limited by pain           Past Medical History:  Diagnosis Date  . Arthritis   . Back pain   . CAD (coronary artery disease)    a. s/p NSTEMI in 12/2018 with 80% Prox-RCA stenosis followed by 100% prox to mid-RCA stenosis --> DESx2 to the RCA. Med management of residual disease.   . Carpal tunnel syndrome   . DDD (degenerative disc disease)   . Diabetes mellitus   . Hyperlipidemia   . Hypertension    states very mild  . Low testosterone   . Overweight   . Seasonal allergies   . Tachycardia     Past Surgical History:  Procedure Laterality Date  . CARDIAC CATHETERIZATION    . CARPAL TUNNEL RELEASE  07/02/2011   Procedure: CARPAL TUNNEL RELEASE;  Surgeon: Wyn Forster., MD;  Location: [facility];  Service: Orthopedics;  Laterality: Right;  . CARPAL TUNNEL RELEASE  07/30/2011   Procedure: CARPAL TUNNEL RELEASE;  Surgeon: Wyn Forster., MD;  Location: [facility];  Service: Orthopedics;  Laterality: Left;  . CORONARY ANGIOPLASTY    . CORONARY STENT INTERVENTION N/A 12/23/2018   Procedure: CORONARY STENT INTERVENTION;  Surgeon: Corky Crafts, MD;  Location: [facility] IN[facility]AS[facility] CV [facility];  Service: Cardiovascular;  Laterality: N/A;  . LEFT HEART CATH AND CORONARY ANGIOGRAPHY N/A 12/23/2018   Procedure: LEFT HEART CATH AND CORONARY ANGIOGRAPHY;   Surgeon: Corky Crafts, MD;  Location: [facility] INVASIVE CV LAB;  Service: Cardiovascular;  Laterality: N/A;  . LUMBAR EPIDURAL INJECTION     x2    There were no vitals filed for this visit.   Subjective Assessment - 10/31/19 1011    Subjective Painoff and on. massager and alcohol rubs and tylenol.  Eased slightly    Pain Score 4     Pain Location Shoulder    Pain Orientation Right;Left    Pain Descriptors / Indicators Aching    Pain Type Chronic pain    Pain Onset More than a month ago    Pain Frequency Constant                             [facility] Adult PT Treatment/Exercise - 10/31/19 0001      Shoulder Exercises: Supine   Protraction Both;12 reps    Protraction Limitations chest press toward ceiling    External Rotation Both;20 reps;AROM    External Rotation Limitations --    Flexion Limitations --    Other Supine Exercises flexion RT/LT x 10 from 90 degrees      Manual Therapy   Manual Therapy Passive ROM    Joint Mobilization grade 1 and 2 , 3 PA and AP mobilizations of the  right shoulder     Soft tissue mobilization to the upper trarp, psoterior shoulder,     Passive ROM side bend and rotaation RT/LT 10 sec hold x 5  with traction    Manual Traction to cervical spine                   PT Education - 10/31/19 1042    Education Details HEP    Person(s) Educated Patient    Methods Explanation;Tactile cues;Verbal cues;Handout    Comprehension Verbalized understanding;Returned demonstration            PT Short Term Goals - 10/17/19 1612      PT SHORT TERM GOAL #1   Title He will be indppendent with intial HEP    Time 3    Period Weeks    Status New      PT SHORT TERM GOAL #2   Title He will have full active ROm RT shoulder    Time 3    Period Weeks    Status New      PT SHORT TERM GOAL #3   Title He will report pain decreased 30-40% or more in RT shoulder with self care    Time 3    Period Weeks    Status New      PT SHORT  TERM GOAL #4   Title He will be able to demo god posture of neck and shoulders    Time 3    Period Weeks    Status New             PT Long Term Goals - 10/17/19 1614      PT LONG TERM GOAL #1   Title He will be indpendent with all hEP issued    Time 6    Period Weeks    Status New      PT LONG TERM GOAL #2   Title His neck ROM will improve to be equal RT to LT    Time 6    Period Weeks    Status New      PT LONG TERM GOAL #3   Title He will report able to use his RT arm full ROM overhead for yard work  with mn to mod pain max pain    Time 6    Period Weeks    Status New      PT LONG TERM GOAL #4   Title He will report neck pain decreased 50% or more    Time 6    Period Weeks    Status New                 Plan - 10/31/19 1043    Clinical Impression Statement He tolerated more movement with shoulders going almost full ROM without complaint in supine.  no incr pain post.  It appers he is making some gains per his report.    Personal Factors and Comorbidities Past/Current Experience;Age;Time since onset of injury/illness/exacerbation;Fitness;Comorbidity 3+    PT Treatment/Interventions Passive range of motion;Dry needling;Manual techniques;Patient/family education;Therapeutic activities;Therapeutic exercise;Electrical Stimulation;Iontophoresis 4mg /ml Dexamethasone;Moist Heat;Traction    PT Next Visit Plan initiate HEP, review posture, manual and modlaities for neck. band or isometrics for sholder /neck    PT Home Exercise Plan posture awareness with chin tucked na dshoulders back, supine AROM shoulder flexion /ER/IR / Hor abduction    Consulted and Agree with Plan of Care Patient           Patient  will benefit from skilled therapeutic intervention in order to improve the following deficits and impairments:  Pain, Postural dysfunction, Decreased range of motion, Increased muscle spasms, Impaired UE functional use  Visit Diagnosis: Muscle weakness  (generalized)  Abnormal posture  Stiffness of neck  Cramp and spasm  Chronic right shoulder pain  Chronic left shoulder pain     Problem List Patient Active Problem List   Diagnosis Date Noted  . AC (acromioclavicular) joint arthritis 09/02/2019  . Diabetic neuropathy (HCC) 08/16/2019  . Chest pain of uncertain etiology 04/27/2019  . CAD -S/P PCI 01/02/2019  . Obesity (BMI 30-39.9) 01/02/2019  . Orthostatic dizziness 01/02/2019  . Hyperlipidemia LDL goal <70 12/25/2018  . History of non-ST elevation myocardial infarction (NSTEMI) 12/23/2018  . Type 2 diabetes mellitus with complication (HCC)   . Tachycardia   . Hypertension     Caprice Red  PT 10/31/2019, 10:45 AM  [facility] [address] [place], [place], [address] Phone: [phone]   Fax:  [phone]  Name: Jahquan Klugh MRN: [id number] Date of Birth: 07-Jul-1955

## 2019-11-02 ENCOUNTER — Other Ambulatory Visit: Payer: Self-pay

## 2019-11-02 ENCOUNTER — Ambulatory Visit: Payer: 59

## 2019-11-02 DIAGNOSIS — M436 Torticollis: Secondary | ICD-10-CM

## 2019-11-02 DIAGNOSIS — M6281 Muscle weakness (generalized): Secondary | ICD-10-CM

## 2019-11-02 DIAGNOSIS — R293 Abnormal posture: Secondary | ICD-10-CM

## 2019-11-02 DIAGNOSIS — G8929 Other chronic pain: Secondary | ICD-10-CM

## 2019-11-02 DIAGNOSIS — M25512 Pain in left shoulder: Secondary | ICD-10-CM

## 2019-11-02 DIAGNOSIS — R252 Cramp and spasm: Secondary | ICD-10-CM

## 2019-11-02 DIAGNOSIS — M25511 Pain in right shoulder: Secondary | ICD-10-CM

## 2019-11-02 NOTE — Therapy (Signed)
[place], [place], [address] Phone: [phone]   Fax:  [phone]  Physical Therapy Treatment  Patient Details  Name: Keith Newton MRN: [id number] Date of Birth: October 08, 1955 Referring Provider (PT): Andrena Mews , MD   Encounter Date: 11/02/2019   PT End of Session - 11/02/19 1036    Visit Number 4    Number of Visits 12    Date for PT Re-Evaluation 11/24/19    Authorization Type [employer]    PT Start Time 1000    PT Stop Time 1050    PT Time Calculation (min) 50 min    Activity Tolerance Patient limited by pain    Behavior During Therapy Flat affect           Past Medical History:  Diagnosis Date  . Arthritis   . Back pain   . CAD (coronary artery disease)    a. s/p NSTEMI in 12/2018 with 80% Prox-RCA stenosis followed by 100% prox to mid-RCA stenosis --> DESx2 to the RCA. Med management of residual disease.   . Carpal tunnel syndrome   . DDD (degenerative disc disease)   . Diabetes mellitus   . Hyperlipidemia   . Hypertension    states very mild  . Low testosterone   . Overweight   . Seasonal allergies   . Tachycardia     Past Surgical History:  Procedure Laterality Date  . CARDIAC CATHETERIZATION    . CARPAL TUNNEL RELEASE  07/02/2011   Procedure: CARPAL TUNNEL RELEASE;  Surgeon: Cammie Sickle., MD;  Location: [place];  Service: Orthopedics;  Laterality: Right;  . CARPAL TUNNEL RELEASE  07/30/2011   Procedure: CARPAL TUNNEL RELEASE;  Surgeon: Cammie Sickle., MD;  Location: [place];  Service: Orthopedics;  Laterality: Left;  . CORONARY ANGIOPLASTY    . CORONARY STENT INTERVENTION N/A 12/23/2018   Procedure: CORONARY STENT INTERVENTION;  Surgeon: Jettie Booze, MD;  Location: [place] [facility];  Service: Cardiovascular;  Laterality: N/A;  . LEFT HEART CATH AND CORONARY ANGIOGRAPHY N/A 12/23/2018   Procedure: LEFT HEART CATH  AND CORONARY ANGIOGRAPHY;  Surgeon: Jettie Booze, MD;  Location: [place] [facility];  Service: Cardiovascular;  Laterality: N/A;  . LUMBAR EPIDURAL INJECTION     x2    There were no vitals filed for this visit.   Subjective Assessment - 11/02/19 1040    Subjective Felt good after I left but was sore the next day    Pain Score 4     Pain Location Shoulder    Pain Orientation Left;Right    Pain Descriptors / Indicators Aching    Pain Onset More than a month ago    Pain Frequency Constant    Aggravating Factors  using arms                             [facility] Adult PT Treatment/Exercise - 11/02/19 0001      Shoulder Exercises: Standing   Extension Both;15 reps    Theraband Level (Shoulder Extension) Level 2 (Red)    Row Both;15 reps    Theraband Level (Shoulder Row) Level 2 (Red)    Other Standing Exercises wall slides bilateral x 10   then scap retraction red band x 12      Shoulder Exercises: ROM/Strengthening   UBE (Upper Arm Bike) L1 4 min  Modalities   Modalities Moist Heat      Moist Heat Therapy   Number Minutes Moist Heat 10 Minutes    Moist Heat Location Cervical      Manual Therapy   Joint Mobilization grade 1 and 2 , 3 PA and AP mobilizations of the sneck and upper spine     Soft tissue mobilization to the upper trarp, anterior and nd neckshoulder,     Passive ROM side bend and rotaation RT/LT 10 sec hold x 5  with traction    Manual Traction to cervical spine                     PT Short Term Goals - 11/02/19 1039      PT SHORT TERM GOAL #1   Title He will be indppendent with intial HEP    Baseline progressing    Status Partially Met      PT SHORT TERM GOAL #2   Title He will have full active ROm RT shoulder    Baseline passively met    Status Partially Met      PT SHORT TERM GOAL #3   Title He will report pain decreased 30-40% or more in RT shoulder with self care    Status On-going      PT SHORT TERM GOAL #4    Title He will be able to demo god posture of neck and shoulders    Baseline can correct if requested    Status On-going             PT Long Term Goals - 10/17/19 1614      PT LONG TERM GOAL #1   Title He will be indpendent with all hEP issued    Time 6    Period Weeks    Status New      PT LONG TERM GOAL #2   Title His neck ROM will improve to be equal RT to LT    Time 6    Period Weeks    Status New      PT LONG TERM GOAL #3   Title He will report able to use his RT arm full ROM overhead for yard work  with mn to mod pain max pain    Time 6    Period Weeks    Status New      PT LONG TERM GOAL #4   Title He will report neck pain decreased 50% or more    Time 6    Period Weeks    Status New                 Plan - 11/02/19 1037    Clinical Impression Statement He continues with pain but his ROM is improved as post STW i was able to lift RT and LT arm into full overhead flexion.  He is cooperative.  Chronicity of pain may make progress slow or limtied    Personal Factors and Comorbidities Past/Current Experience;Age;Time since onset of injury/illness/exacerbation;Fitness;Comorbidity 3+    PT Treatment/Interventions Passive range of motion;Dry needling;Manual techniques;Patient/family education;Therapeutic activities;Therapeutic exercise;Electrical Stimulation;Iontophoresis 52m/ml Dexamethasone;Moist Heat;Traction    PT Next Visit Plan , review posture, manual and modlaities for neck. band or isometrics for sholder /neck dd band to HEP    PT Home Exercise Plan posture awareness with chin tucked na dshoulders back, supine AROM shoulder flexion /ER/IR / Hor abduction    Consulted and Agree with Plan of Care Patient  Patient will benefit from skilled therapeutic intervention in order to improve the following deficits and impairments:  Pain, Postural dysfunction, Decreased range of motion, Increased muscle spasms, Impaired UE functional use  Visit  Diagnosis: Muscle weakness (generalized)  Abnormal posture  Stiffness of neck  Cramp and spasm  Chronic right shoulder pain  Chronic left shoulder pain     Problem List Patient Active Problem List   Diagnosis Date Noted  . AC (acromioclavicular) joint arthritis 09/02/2019  . Diabetic neuropathy ([place]) 08/16/2019  . Chest pain of uncertain etiology 41/14/6431  . CAD -S/P PCI 01/02/2019  . Obesity (BMI 30-39.9) 01/02/2019  . Orthostatic dizziness 01/02/2019  . Hyperlipidemia LDL goal <70 12/25/2018  . History of non-ST elevation myocardial infarction (NSTEMI) 12/23/2018  . Type 2 diabetes mellitus with complication ([place])   . Tachycardia   . Hypertension     Darrel Hoover  PT 11/02/2019, 10:41 AM  [facility] [address] [place], [place], [address] Phone: [phone]   Fax:  [phone]  Name: Keith Newton MRN: [id number] Date of Birth: [birth year]

## 2019-11-07 ENCOUNTER — Other Ambulatory Visit: Payer: Self-pay

## 2019-11-07 ENCOUNTER — Ambulatory Visit: Payer: 59

## 2019-11-07 DIAGNOSIS — R293 Abnormal posture: Secondary | ICD-10-CM

## 2019-11-07 DIAGNOSIS — M6281 Muscle weakness (generalized): Secondary | ICD-10-CM

## 2019-11-07 DIAGNOSIS — M436 Torticollis: Secondary | ICD-10-CM

## 2019-11-07 NOTE — Therapy (Signed)
[place], [place], [address] Phone: [phone]   Fax:  [phone]  Physical Therapy Treatment  Patient Details  Name: Padraig Nhan MRN: [id number] Date of Birth: 04-Feb-1956 Referring Provider (PT): Andrena Mews , MD   Encounter Date: 11/07/2019   PT End of Session - 11/07/19 1109    Visit Number 5    Number of Visits 12    Date for PT Re-Evaluation 11/24/19    Authorization Type [employer]    PT Start Time 1105   Pt late   PT Stop Time 1140    PT Time Calculation (min) 35 min    Activity Tolerance Patient limited by pain    Behavior During Therapy Flat affect           Past Medical History:  Diagnosis Date  . Arthritis   . Back pain   . CAD (coronary artery disease)    a. s/p NSTEMI in 12/2018 with 80% Prox-RCA stenosis followed by 100% prox to mid-RCA stenosis --> DESx2 to the RCA. Med management of residual disease.   . Carpal tunnel syndrome   . DDD (degenerative disc disease)   . Diabetes mellitus   . Hyperlipidemia   . Hypertension    states very mild  . Low testosterone   . Overweight   . Seasonal allergies   . Tachycardia     Past Surgical History:  Procedure Laterality Date  . CARDIAC CATHETERIZATION    . CARPAL TUNNEL RELEASE  07/02/2011   Procedure: CARPAL TUNNEL RELEASE;  Surgeon: Cammie Sickle., MD;  Location: [place];  Service: Orthopedics;  Laterality: Right;  . CARPAL TUNNEL RELEASE  07/30/2011   Procedure: CARPAL TUNNEL RELEASE;  Surgeon: Cammie Sickle., MD;  Location: [place];  Service: Orthopedics;  Laterality: Left;  . CORONARY ANGIOPLASTY    . CORONARY STENT INTERVENTION N/A 12/23/2018   Procedure: CORONARY STENT INTERVENTION;  Surgeon: Jettie Booze, MD;  Location: [place] [facility];  Service: Cardiovascular;  Laterality: N/A;  . LEFT HEART CATH AND CORONARY ANGIOGRAPHY N/A 12/23/2018   Procedure: LEFT  HEART CATH AND CORONARY ANGIOGRAPHY;  Surgeon: Jettie Booze, MD;  Location: [place] [facility];  Service: Cardiovascular;  Laterality: N/A;  . LUMBAR EPIDURAL INJECTION     x2    There were no vitals filed for this visit.   Subjective Assessment - 11/07/19 1110    Subjective Still sore in neck and shoulders.    Pain Score 4     Pain Location Shoulder    Pain Orientation Right;Left    Pain Descriptors / Indicators Aching    Pain Type Chronic pain    Pain Onset More than a month ago    Pain Frequency Constant                             [facility] Adult PT Treatment/Exercise - 11/07/19 0001      Shoulder Exercises: Seated   Row Right;Left;10 reps    Theraband Level (Shoulder Row) Level 3 (Green)    Other Seated Exercises over head press 2# x 10, then 2# bicep curls x 20, than  abduction to below 90 degrees        Shoulder Exercises: Pulleys   Flexion 3 minutes      Shoulder Exercises: ROM/Strengthening   UBE (Upper Arm Bike) L1 3 min  Moist Heat Therapy   Number Minutes Moist Heat 10 Minutes    Moist Heat Location Cervical      Manual Therapy   Soft tissue mobilization to the upper trarp, anterior and nd neckshoulder,                     PT Short Term Goals - 11/02/19 1039      PT SHORT TERM GOAL #1   Title He will be indppendent with intial HEP    Baseline progressing    Status Partially Met      PT SHORT TERM GOAL #2   Title He will have full active ROm RT shoulder    Baseline passively met    Status Partially Met      PT SHORT TERM GOAL #3   Title He will report pain decreased 30-40% or more in RT shoulder with self care    Status On-going      PT SHORT TERM GOAL #4   Title He will be able to demo god posture of neck and shoulders    Baseline can correct if requested    Status On-going             PT Long Term Goals - 10/17/19 1614      PT LONG TERM GOAL #1   Title He will be indpendent with all hEP issued     Time 6    Period Weeks    Status New      PT LONG TERM GOAL #2   Title His neck ROM will improve to be equal RT to LT    Time 6    Period Weeks    Status New      PT LONG TERM GOAL #3   Title He will report able to use his RT arm full ROM overhead for yard work  with mn to mod pain max pain    Time 6    Period Weeks    Status New      PT LONG TERM GOAL #4   Title He will report neck pain decreased 50% or more    Time 6    Period Weeks    Status New                 Plan - 11/07/19 1110    Clinical Impression Statement Pt late but was , with pain able to lift weight overhead LT easier tha RT to go full ROM.   Pain not much diferent but rangel and movement improved in clinic    PT Treatment/Interventions Passive range of motion;Dry needling;Manual techniques;Patient/family education;Therapeutic activities;Therapeutic exercise;Electrical Stimulation;Iontophoresis 47m/ml Dexamethasone;Moist Heat;Traction    PT Next Visit Plan , review posture, manual and modlaities for neck. band or isometrics for sholder /neck dd band to HEP    PT Home Exercise Plan posture awareness with chin tucked na dshoulders back, supine AROM shoulder flexion /ER/IR / Hor abduction    Consulted and Agree with Plan of Care Patient           Patient will benefit from skilled therapeutic intervention in order to improve the following deficits and impairments:  Pain, Postural dysfunction, Decreased range of motion, Increased muscle spasms, Impaired UE functional use  Visit Diagnosis: Muscle weakness (generalized)  Stiffness of neck  Abnormal posture     Problem List Patient Active Problem List   Diagnosis Date Noted  . AC (acromioclavicular) joint arthritis 09/02/2019  . Diabetic neuropathy ([place] 08/16/2019  .  Chest pain of uncertain etiology 79/89/2119  . CAD -S/P PCI 01/02/2019  . Obesity (BMI 30-39.9) 01/02/2019  . Orthostatic dizziness 01/02/2019  . Hyperlipidemia LDL goal <70  12/25/2018  . History of non-ST elevation myocardial infarction (NSTEMI) 12/23/2018  . Type 2 diabetes mellitus with complication ([place])   . Tachycardia   . Hypertension     Darrel Hoover  PT 11/07/2019, 11:43 AM  [facility] [address] [place], [place], [address] Phone: [phone]   Fax:  [phone]  Name: Apostolos Blagg MRN: [id number] Date of Birth: [birth year]

## 2019-11-08 ENCOUNTER — Ambulatory Visit (INDEPENDENT_AMBULATORY_CARE_PROVIDER_SITE_OTHER): Payer: 59 | Admitting: Cardiology

## 2019-11-08 ENCOUNTER — Encounter: Payer: Self-pay | Admitting: Cardiology

## 2019-11-08 VITALS — BP 124/82 | HR 83 | Temp 97.2°F | Ht 68.0 in | Wt 251.2 lb

## 2019-11-08 DIAGNOSIS — E118 Type 2 diabetes mellitus with unspecified complications: Secondary | ICD-10-CM | POA: Diagnosis not present

## 2019-11-08 DIAGNOSIS — E785 Hyperlipidemia, unspecified: Secondary | ICD-10-CM | POA: Diagnosis not present

## 2019-11-08 DIAGNOSIS — R0600 Dyspnea, unspecified: Secondary | ICD-10-CM | POA: Diagnosis not present

## 2019-11-08 DIAGNOSIS — I252 Old myocardial infarction: Secondary | ICD-10-CM

## 2019-11-08 DIAGNOSIS — I2510 Atherosclerotic heart disease of native coronary artery without angina pectoris: Secondary | ICD-10-CM | POA: Diagnosis not present

## 2019-11-08 DIAGNOSIS — R0609 Other forms of dyspnea: Secondary | ICD-10-CM | POA: Insufficient documentation

## 2019-11-08 DIAGNOSIS — Z9861 Coronary angioplasty status: Secondary | ICD-10-CM

## 2019-11-08 DIAGNOSIS — E1142 Type 2 diabetes mellitus with diabetic polyneuropathy: Secondary | ICD-10-CM

## 2019-11-08 DIAGNOSIS — I10 Essential (primary) hypertension: Secondary | ICD-10-CM

## 2019-11-08 DIAGNOSIS — E669 Obesity, unspecified: Secondary | ICD-10-CM

## 2019-11-08 NOTE — Assessment & Plan Note (Signed)
S/P inferior NSTEMI 12/23/2018 

## 2019-11-08 NOTE — Progress Notes (Signed)
Cardiology Office Note:    Date:  11/08/2019   ID:  Ke[facility], DOB 1955-05-08, MRN [id number]  PCP:  Benay Pike, MD  Cardiologist:  Shelva Majestic, MD  Electrophysiologist:  None   Referring MD: Benay Pike, MD   CC: DOE  History of Present Illness:    Keith [facility] is an overweight 64 y.o. male with a hx of hypertension, non-insulin-dependent diabetes, and a past evaluation for sinus tachycardia.The patient was admitted 12/23/2018 with nausea vomiting chest pain and shortness of breath. He was markedly hypertensive in the emergency room with a blood pressure of 180/114. HStroponin was greater than 27,000. He was taken to the Cath Lab where he had an intervention to 2 sites in the RCA. He tolerated this well. He does have residual 40%pLAD and 50%mCFX, and 75% PDA. Ejection fraction was 55-60% with basilar hypokinesis by echo. He was discharged 12/25/2018. He was enrolled in the [place].  After discharge he had some problems with orthostatic hypotension and his medications were adjusted.  He also had issues with chest pain and leg pain.  Lower extremity arterial Dopplers were negative for significant arterial disease and it suspected his lower extremity discomfort is from diabetic neuropathy.  His chest pain resolved with adjustment in his oral nitrates.  He is seen in the office today for follow-up.  His daughter handles his medications, he really has no idea what he takes.  Since we saw the patient last he says he is not had chest pain.  He does complain of significant shortness of breath with minimal exertion.  He gave me an example of going grocery shopping where he had to stop and go sit in the car.  His symptoms on admission in November 2020 were also somewhat atypical, he did not have classic chest pain.  Past Medical History:  Diagnosis Date  . Arthritis   . Back pain   . CAD (coronary artery disease)    a. s/p NSTEMI in 12/2018 with 80% Prox-RCA stenosis  followed by 100% prox to mid-RCA stenosis --> DESx2 to the RCA. Med management of residual disease.   . Carpal tunnel syndrome   . DDD (degenerative disc disease)   . Diabetes mellitus   . Hyperlipidemia   . Hypertension    states very mild  . Low testosterone   . Overweight   . Seasonal allergies   . Tachycardia     Past Surgical History:  Procedure Laterality Date  . CARDIAC CATHETERIZATION    . CARPAL TUNNEL RELEASE  07/02/2011   Procedure: CARPAL TUNNEL RELEASE;  Surgeon: Cammie Sickle., MD;  Location: [place] [facility];  Service: Orthopedics;  Laterality: Right;  . CARPAL TUNNEL RELEASE  07/30/2011   Procedure: CARPAL TUNNEL RELEASE;  Surgeon: Cammie Sickle., MD;  Location: [facility];  Service: Orthopedics;  Laterality: Left;  . CORONARY ANGIOPLASTY    . CORONARY STENT INTERVENTION N/A 12/23/2018   Procedure: CORONARY STENT INTERVENTION;  Surgeon: Jettie Booze, MD;  Location: [facility];  Service: Cardiovascular;  Laterality: N/A;  . LEFT HEART CATH AND CORONARY ANGIOGRAPHY N/A 12/23/2018   Procedure: LEFT HEART CATH AND CORONARY ANGIOGRAPHY;  Surgeon: Jettie Booze, MD;  Location: [facility];  Service: Cardiovascular;  Laterality: N/A;  . LUMBAR EPIDURAL INJECTION     x2    Current Medications: Current Meds  Medication Sig  . acetaminophen (TYLENOL) 325 MG tablet Take 650 mg by mouth  every 6 (six) hours as needed.  Marland Kitchen aspirin EC 81 MG EC tablet Take 1 tablet (81 mg total) by mouth daily.  Marland Kitchen atorvastatin (LIPITOR) 80 MG tablet TAKE 1 TABLET BY MOUTH EVERY DAY  . Baclofen 5 MG TABS Take 5 mg by mouth at bedtime as needed.  . Blood Glucose Monitoring Suppl (ONETOUCH VERIO IQ SYSTEM) w/Device KIT Check fasting blood glucose in AM and before giving insulin.  . canagliflozin (INVOKANA) 300 MG TABS tablet Take 1 tablet (300 mg total) by mouth daily.  . chlorpheniramine (CHLOR-TRIMETON) 4 MG tablet Take 4 mg by mouth  every 6 (six) hours as needed for allergies.  Marland Kitchen glucose blood (ONETOUCH VERIO) test strip Use as instructed  . insulin degludec (TRESIBA FLEXTOUCH) 100 UNIT/ML SOPN FlexTouch Pen Inject 40 Units into the skin daily.  . Insulin Pen Needle (NOVOFINE PLUS) 32G X 4 MM MISC Inject into the skin daily.  Marland Kitchen JARDIANCE 25 MG TABS tablet Take 25 mg by mouth daily.  Elmore Guise Devices (ONE TOUCH DELICA LANCING DEV) MISC Check fasting blood glucose in AM and before giving insulin.  . metFORMIN (GLUCOPHAGE) 1000 MG tablet TAKE 1 TABLET BY MOUTH TWICE A DAY  . metoprolol tartrate (LOPRESSOR) 50 MG tablet Take 1 tablet (50 mg total) by mouth 2 (two) times daily.  . nitroGLYCERIN (NITROSTAT) 0.4 MG SL tablet Place 1 tablet (0.4 mg total) under the tongue every 5 (five) minutes x 3 doses as needed for chest pain.  Glory Rosebush Delica Lancets 79K MISC Check fasting blood glucose in AM and before giving insulin.  . pregabalin (LYRICA) 50 MG capsule Take 1 capsule (50 mg total) by mouth 3 (three) times daily.  . ticagrelor (BRILINTA) 90 MG TABS tablet Take 1 tablet (90 mg total) by mouth 2 (two) times daily.     Allergies:   Codeine   Social History   Socioeconomic History  . Marital status: Married    Spouse name: Mallie Linnemann  . Number of children: 2  . Years of education: 16  . Highest education level: Bachelor's degree (e.g., BA, AB, BS)  Occupational History  . Occupation: retired  Tobacco Use  . Smoking status: Never Smoker  . Smokeless tobacco: Never Used  Vaping Use  . Vaping Use: Never used  Substance and Sexual Activity  . Alcohol use: No  . Drug use: No  . Sexual activity: Not on file  Other Topics Concern  . Not on file  Social History Narrative  . Not on file   Social Determinants of Health   Financial Resource Strain: Low Risk   . Difficulty of Paying Living Expenses: Not hard at all  Food Insecurity: No Food Insecurity  . Worried About Charity fundraiser in the Last Year: Never  true  . Ran Out of Food in the Last Year: Never true  Transportation Needs: No Transportation Needs  . Lack of Transportation (Medical): No  . Lack of Transportation (Non-Medical): No  Physical Activity: Inactive  . Days of Exercise per Week: 0 days  . Minutes of Exercise per Session: 0 min  Stress: No Stress Concern Present  . Feeling of Stress : Only a little  Social Connections:   . Frequency of Communication with Friends and Family: Not on file  . Frequency of Social Gatherings with Friends and Family: Not on file  . Attends Religious Services: Not on file  . Active Member of Clubs or Organizations: Not on file  . Attends Club  or Organization Meetings: Not on file  . Marital Status: Not on file     Family History: The patient's family history includes CAD in his father, maternal grandfather, and paternal grandfather; Stroke in his mother.  ROS:   Please see the history of present illness.     All other systems reviewed and are negative.  EKGs/Labs/Other Studies Reviewed:    The following studies were reviewed today: Cath/ PCI Nov 2020  EKG:  EKG is ordered today.  The ekg ordered today demonstrates NSR, HR 90, poor anterior RW, inferior TWI ( more prominent than April 2021)  Recent Labs: 12/23/2018: TSH 0.418 03/01/2019: ALT 23; BUN 15; Creatinine, Ser 1.04; Hemoglobin 15.1; Platelets 309; Potassium 4.6; Sodium 139  Recent Lipid Panel    Component Value Date/Time   CHOL 110 03/01/2019 1051   TRIG 110 03/01/2019 1051   HDL 35 (L) 03/01/2019 1051   CHOLHDL 3.1 03/01/2019 1051   CHOLHDL 6.6 12/24/2018 0214   VLDL 25 12/24/2018 0214   LDLCALC 55 03/01/2019 1051    Physical Exam:    VS:  BP 124/82   Pulse 83   Temp (!) 97.2 F (36.2 C)   Ht 5' 8"  (1.727 m)   Wt 251 lb 3.2 oz (113.9 kg)   SpO2 99%   BMI 38.19 kg/m     Wt Readings from Last 3 Encounters:  11/08/19 251 lb 3.2 oz (113.9 kg)  09/06/19 252 lb 6.4 oz (114.5 kg)  08/30/19 255 lb 3.2 oz (115.8 kg)      GEN: Overweight AA male, , well developed in no acute distress HEENT: Normal NECK: No JVD; No carotid bruits LYMPHATICS: No lymphadenopathy CARDIAC: RRR, no murmurs, rubs, gallops RESPIRATORY:  Clear to auscultation without rales, wheezing or rhonchi  ABDOMEN: Obese, Soft, non-tender, non-distended MUSCULOSKELETAL:  No edema; No deformity  SKIN: Warm and dry NEUROLOGIC:  Alert and oriented x 3 PSYCHIATRIC:  Normal affect   ASSESSMENT:    Exertional dyspnea Pt notices significant weakness and dyspnea with exertion- shopping  CAD -S/P PCI S/P RCA PCI with DES x 2 12/23/2018 Residual 40% LAD, 50% CFX- EF 55%  Type 2 diabetes mellitus with complication (HCC) Followed by PCP  Hyperlipidemia LDL goal <70 On high dose statin Rx  Hypertension Controlled  History of non-ST elevation myocardial infarction (NSTEMI) S/P inferior NSTEMI 12/23/2018  Diabetic neuropathy (HCC) LE neuropathy  Obesity (BMI 30-39.9) BMI 38  PLAN:    Check Lexiscan- f/u Dr Claiborne Billings in Nov 2021- one year after PCI.    Medication Adjustments/Labs and Tests Ordered: Current medicines are reviewed at length with the patient today.  Concerns regarding medicines are outlined above.  No orders of the defined types were placed in this encounter.  No orders of the defined types were placed in this encounter.   There are no Patient Instructions on file for this visit.   Signed, Kerin Ransom, PA-C  11/08/2019 9:59 AM    [place]

## 2019-11-08 NOTE — Assessment & Plan Note (Signed)
Controlled.  

## 2019-11-08 NOTE — Assessment & Plan Note (Signed)
BMI 38 

## 2019-11-08 NOTE — Assessment & Plan Note (Signed)
On high dose statin Rx 

## 2019-11-08 NOTE — Assessment & Plan Note (Signed)
S/P RCA PCI with DES x 2 12/23/2018 Residual 40% LAD, 50% CFX- EF 55% 

## 2019-11-08 NOTE — Assessment & Plan Note (Signed)
LE neuropathy 

## 2019-11-08 NOTE — Assessment & Plan Note (Signed)
Followed by PCP

## 2019-11-08 NOTE — Patient Instructions (Signed)
Medication Instructions:  No Medication changes *If you need a refill on your cardiac medications before your next appointment, please call your pharmacy*   Lab Work: No labs Indicated If you have labs (blood work) drawn today and your tests are completely normal, you will receive your results only by: Marland Kitchen MyChart Message (if you have MyChart) OR . A paper copy in the mail If you have any lab test that is abnormal or we need to change your treatment, we will call you to review the results.   Testing/Procedures: Test Location [address] Your physician has requested that you have a lexiscan myoview. For further information please visit [email or web address] Please follow instruction sheet, as given.    Follow-Up: At [facility], you and your health needs are our priority.  As part of our continuing mission to provide you with exceptional heart care, we have created designated Provider Care Teams.  These Care Teams include your primary Cardiologist (physician) and Advanced Practice Providers (APPs -  Physician Assistants and Nurse Practitioners) who all work together to provide you with the care you need, when you need it.  We recommend signing up for the patient portal called "MyChart".  Sign up information is provided on this After Visit Summary.  MyChart is used to connect with patients for Virtual Visits (Telemedicine).  Patients are able to view lab/test results, encounter notes, upcoming appointments, etc.  Non-urgent messages can be sent to your provider as well.   To learn more about what you can do with MyChart, go to [email or web address].    Your next appointment:   2 month(s)  The format for your next appointment:   In Person  Provider:   Nicki Gu[place], MD   Other Instructions

## 2019-11-08 NOTE — Assessment & Plan Note (Signed)
Pt notices significant weakness and dyspnea with exertion- shopping

## 2019-11-09 ENCOUNTER — Ambulatory Visit: Payer: 59

## 2019-11-09 ENCOUNTER — Other Ambulatory Visit: Payer: Self-pay

## 2019-11-09 DIAGNOSIS — M436 Torticollis: Secondary | ICD-10-CM

## 2019-11-09 DIAGNOSIS — R293 Abnormal posture: Secondary | ICD-10-CM

## 2019-11-09 DIAGNOSIS — R252 Cramp and spasm: Secondary | ICD-10-CM

## 2019-11-09 DIAGNOSIS — M6281 Muscle weakness (generalized): Secondary | ICD-10-CM

## 2019-11-09 DIAGNOSIS — G8929 Other chronic pain: Secondary | ICD-10-CM

## 2019-11-09 NOTE — Therapy (Signed)
[place], [place], [address] Phone: [phone]   Fax:  [phone]  Physical Therapy Treatment  Patient Details  Name: [facility] MRN: [id number] Date of Birth: Oct 07, 1955 Referring Provider (PT): Andrena Mews , MD   Encounter Date: 11/09/2019   PT End of Session - 11/09/19 1029    Visit Number 6    Number of Visits 12    Date for PT Re-Evaluation 11/24/19    Authorization Type [employer]    PT Start Time 1035    PT Stop Time 1125    PT Time Calculation (min) 50 min    Activity Tolerance Patient limited by pain    Behavior During Therapy Flat affect           Past Medical History:  Diagnosis Date  . Arthritis   . Back pain   . CAD (coronary artery disease)    a. s/p NSTEMI in 12/2018 with 80% Prox-RCA stenosis followed by 100% prox to mid-RCA stenosis --> DESx2 to the RCA. Med management of residual disease.   . Carpal tunnel syndrome   . DDD (degenerative disc disease)   . Diabetes mellitus   . Hyperlipidemia   . Hypertension    states very mild  . Low testosterone   . Overweight   . Seasonal allergies   . Tachycardia     Past Surgical History:  Procedure Laterality Date  . CARDIAC CATHETERIZATION    . CARPAL TUNNEL RELEASE  07/02/2011   Procedure: CARPAL TUNNEL RELEASE;  Surgeon: Cammie Sickle., MD;  Location: [facility];  Service: Orthopedics;  Laterality: Right;  . CARPAL TUNNEL RELEASE  07/30/2011   Procedure: CARPAL TUNNEL RELEASE;  Surgeon: Cammie Sickle., MD;  Location: [facility];  Service: Orthopedics;  Laterality: Left;  . CORONARY ANGIOPLASTY    . CORONARY STENT INTERVENTION N/A 12/23/2018   Procedure: CORONARY STENT INTERVENTION;  Surgeon: Jettie Booze, MD;  Location: [facility];  Service: Cardiovascular;  Laterality: N/A;  . LEFT HEART CATH AND CORONARY ANGIOGRAPHY N/A 12/23/2018   Procedure: LEFT HEART CATH  AND CORONARY ANGIOGRAPHY;  Surgeon: Jettie Booze, MD;  Location: [facility];  Service: Cardiovascular;  Laterality: N/A;  . LUMBAR EPIDURAL INJECTION     x2    There were no vitals filed for this visit.   Subjective Assessment - 11/09/19 1115    Subjective Pain bad today since getting up  Rainy weather increases pain for 1-2 days after it stops.    Pain Score 7     Pain Location Neck   and shoulders   Pain Orientation Right;Left    Pain Descriptors / Indicators Aching    Pain Type Chronic pain    Pain Onset More than a month ago    Pain Frequency Constant    Aggravating Factors  using arms    Pain Relieving Factors meds and massage machine                             [facility] Adult PT Treatment/Exercise - 11/09/19 0001      Shoulder Exercises: Seated   Other Seated Exercises over head press 2# x 10, then 2# bicep curls x 20, than  abduction to below 90 degrees        Shoulder Exercises: Pulleys   Flexion 3 minutes      Shoulder Exercises:  ROM/Strengthening   UBE (Upper Arm Bike) L2 5 min      Moist Heat Therapy   Number Minutes Moist Heat 10 Minutes    Moist Heat Location Cervical      Manual Therapy   Joint Mobilization Gr 2-3 to neck and upper back central and lateral                  PT Education - 11/09/19 1042    Education Details use of theracane and sleeping positions    Person(s) Educated Patient    Methods Explanation    Comprehension --            PT Short Term Goals - 11/02/19 1039      PT SHORT TERM GOAL #1   Title He will be indppendent with intial HEP    Baseline progressing    Status Partially Met      PT SHORT TERM GOAL #2   Title He will have full active ROm RT shoulder    Baseline passively met    Status Partially Met      PT SHORT TERM GOAL #3   Title He will report pain decreased 30-40% or more in RT shoulder with self care    Status On-going      PT SHORT TERM GOAL #4   Title He will be  able to demo god posture of neck and shoulders    Baseline can correct if requested    Status On-going             PT Long Term Goals - 10/17/19 1614      PT LONG TERM GOAL #1   Title He will be indpendent with all hEP issued    Time 6    Period Weeks    Status New      PT LONG TERM GOAL #2   Title His neck ROM will improve to be equal RT to LT    Time 6    Period Weeks    Status New      PT LONG TERM GOAL #3   Title He will report able to use his RT arm full ROM overhead for yard work  with mn to mod pain max pain    Time 6    Period Weeks    Status New      PT LONG TERM GOAL #4   Title He will report neck pain decreased 50% or more    Time 6    Period Weeks    Status New                 Plan - 11/09/19 1029    Clinical Impression Statement Continues to have pain without  extended improvement after the PT sessions though PT helps short term Will continue per plan and trial mechanical neck traction    PT Treatment/Interventions Passive range of motion;Dry needling;Manual techniques;Patient/family education;Therapeutic activities;Therapeutic exercise;Electrical Stimulation;Iontophoresis 11m/ml Dexamethasone;Moist Heat;Traction    PT Next Visit Plan , review posture, manual and modlaities for neck. band or isometrics for sholder /neck dd band to HEP    PT Home Exercise Plan posture awareness with chin tucked na dshoulders back, supine AROM shoulder flexion /ER/IR / Hor abduction    Consulted and Agree with Plan of Care Patient           Patient will benefit from skilled therapeutic intervention in order to improve the following deficits and impairments:  Pain, Postural dysfunction, Decreased range of  motion, Increased muscle spasms, Impaired UE functional use  Visit Diagnosis: Muscle weakness (generalized)  Stiffness of neck  Abnormal posture  Cramp and spasm  Chronic right shoulder pain  Chronic left shoulder pain     Problem List Patient  Active Problem List   Diagnosis Date Noted  . Exertional dyspnea 11/08/2019  . AC (acromioclavicular) joint arthritis 09/02/2019  . Diabetic neuropathy ([place]) 08/16/2019  . Chest pain of uncertain etiology 99/68/9570  . CAD -S/P PCI 01/02/2019  . Obesity (BMI 30-39.9) 01/02/2019  . Orthostatic dizziness 01/02/2019  . Hyperlipidemia LDL goal <70 12/25/2018  . History of non-ST elevation myocardial infarction (NSTEMI) 12/23/2018  . Type 2 diabetes mellitus with complication ([place])   . Tachycardia   . Hypertension     Darrel Hoover  PT 11/09/2019, 11:26 AM  [facility] [address] [place], [place], [address] Phone: [phone]   Fax:  [phone]  Name: Rutherford [facility] MRN: [id number] Date of Birth: [birth year]

## 2019-11-10 ENCOUNTER — Telehealth: Payer: Self-pay | Admitting: Cardiology

## 2019-11-10 DIAGNOSIS — R0609 Other forms of dyspnea: Secondary | ICD-10-CM

## 2019-11-10 DIAGNOSIS — R0600 Dyspnea, unspecified: Secondary | ICD-10-CM

## 2019-11-10 DIAGNOSIS — I2510 Atherosclerotic heart disease of native coronary artery without angina pectoris: Secondary | ICD-10-CM

## 2019-11-10 DIAGNOSIS — I252 Old myocardial infarction: Secondary | ICD-10-CM

## 2019-11-10 DIAGNOSIS — Z9861 Coronary angioplasty status: Secondary | ICD-10-CM

## 2019-11-10 NOTE — Telephone Encounter (Signed)
Daughter is requesting order for cardiac rehab to help with symptoms He was unable to do in-person cardiac rehab after heart attack due to covid He has myoview 11/17/2019  Routed to [place] PA to advise on request

## 2019-11-10 NOTE — Addendum Note (Signed)
Addended by: Myna Hidalgo A on: 11/10/2019 04:33 PM   Modules accepted: Orders

## 2019-11-10 NOTE — Telephone Encounter (Signed)
New message:     Patient daughter would like to have a order for her father to have therapy. Please call patient daughter.

## 2019-11-13 ENCOUNTER — Telehealth: Payer: Self-pay | Admitting: Cardiovascular Disease

## 2019-11-13 NOTE — Telephone Encounter (Signed)
Mikai's daughter is calling requesting to speak with a nurse in regards to what medications Dr. Tresa Endo is wanting him to stop taking. Please advise.

## 2019-11-13 NOTE — Telephone Encounter (Signed)
Spoke with the patients daughter about patients recent 9/24 OV. Patient was confused and wanted to make sure that there were no medications that needed to be changed. After looking through chart and provider OV note, did not see any medication changes that were made. She verbalized understanding.

## 2019-11-14 ENCOUNTER — Other Ambulatory Visit: Payer: Self-pay

## 2019-11-14 ENCOUNTER — Ambulatory Visit: Payer: 59

## 2019-11-14 DIAGNOSIS — M6281 Muscle weakness (generalized): Secondary | ICD-10-CM

## 2019-11-14 DIAGNOSIS — R293 Abnormal posture: Secondary | ICD-10-CM

## 2019-11-14 DIAGNOSIS — M436 Torticollis: Secondary | ICD-10-CM

## 2019-11-14 DIAGNOSIS — R252 Cramp and spasm: Secondary | ICD-10-CM

## 2019-11-14 DIAGNOSIS — G8929 Other chronic pain: Secondary | ICD-10-CM

## 2019-11-14 DIAGNOSIS — M25512 Pain in left shoulder: Secondary | ICD-10-CM

## 2019-11-14 NOTE — Patient Instructions (Signed)
REd band row/extension /punches/ER x 10-15 reps daily  bilaterally

## 2019-11-14 NOTE — Therapy (Signed)
[place], [place], [address] Phone: [phone]   Fax:  [phone]  Physical Therapy Treatment  Patient Details  Name: Keith Newton MRN: [id number] Date of Birth: [birth year] Referring Provider (PT): Andrena Mews , MD   Encounter Date: 11/14/2019   PT End of Session - 11/14/19 1001    Visit Number 7    Number of Visits 12    Date for PT Re-Evaluation 11/24/19    Authorization Type [employer]    PT Start Time 1000    PT Stop Time 1050    PT Time Calculation (min) 50 min    Activity Tolerance Patient limited by pain    Behavior During Therapy Flat affect           Past Medical History:  Diagnosis Date  . Arthritis   . Back pain   . CAD (coronary artery disease)    a. s/p NSTEMI in 12/2018 with 80% Prox-RCA stenosis followed by 100% prox to mid-RCA stenosis --> DESx2 to the RCA. Med management of residual disease.   . Carpal tunnel syndrome   . DDD (degenerative disc disease)   . Diabetes mellitus   . Hyperlipidemia   . Hypertension    states very mild  . Low testosterone   . Overweight   . Seasonal allergies   . Tachycardia     Past Surgical History:  Procedure Laterality Date  . CARDIAC CATHETERIZATION    . CARPAL TUNNEL RELEASE  07/02/2011   Procedure: CARPAL TUNNEL RELEASE;  Surgeon: Cammie Sickle., MD;  Location: [place];  Service: Orthopedics;  Laterality: Right;  . CARPAL TUNNEL RELEASE  07/30/2011   Procedure: CARPAL TUNNEL RELEASE;  Surgeon: Cammie Sickle., MD;  Location: [place];  Service: Orthopedics;  Laterality: Left;  . CORONARY ANGIOPLASTY    . CORONARY STENT INTERVENTION N/A 12/23/2018   Procedure: CORONARY STENT INTERVENTION;  Surgeon: Jettie Booze, MD;  Location: [place] [facility];  Service: Cardiovascular;  Laterality: N/A;  . LEFT HEART CATH AND CORONARY ANGIOGRAPHY N/A 12/23/2018   Procedure: LEFT HEART CATH  AND CORONARY ANGIOGRAPHY;  Surgeon: Jettie Booze, MD;  Location: [place] [facility];  Service: Cardiovascular;  Laterality: N/A;  . LUMBAR EPIDURAL INJECTION     x2    There were no vitals filed for this visit.   Subjective Assessment - 11/14/19 1014    Subjective Woke with painful shoulders but alcohol rub and heat eased pain    Pain Score 4     Pain Location Neck   shoulders   Pain Orientation Left;Right    Pain Descriptors / Indicators Aching    Pain Type Chronic pain    Pain Onset More than a month ago    Pain Frequency Constant    Aggravating Factors  using arms    Pain Relieving Factors massage, meds , heat                             [facility] Adult PT Treatment/Exercise - 11/14/19 0001      Shoulder Exercises: Standing   Extension Both;15 reps    Theraband Level (Shoulder Extension) Level 2 (Red)    Row Both;15 reps    Theraband Level (Shoulder Row) Level 2 (Red)    Other Standing Exercises punches x 15 red band      Shoulder Exercises: ROM/Strengthening   UBE (  Upper Arm Bike) L2 5 min      Modalities   Modalities Traction      Moist Heat Therapy   Number Minutes Moist Heat 10 Minutes    Moist Heat Location Cervical      Traction   Type of Traction Cervical    Min (lbs) 5    Max (lbs) 12    Hold Time 60    Rest Time 15    Time 12                  PT Education - 11/14/19 1109    Education Details HEP bands    Person(s) Educated Patient    Methods Explanation;Tactile cues;Verbal cues;Handout    Comprehension Verbalized understanding;Returned demonstration            PT Short Term Goals - 11/14/19 1115      PT SHORT TERM GOAL #1   Title He will be indppendent with intial HEP    Status Achieved      PT SHORT TERM GOAL #2   Baseline with verbal cues    Status Achieved      PT SHORT TERM GOAL #3   Title He will report pain decreased 30-40% or more in RT shoulder with self care    Status On-going      PT SHORT  TERM GOAL #4   Title He will be able to demo god posture of neck and shoulders    Baseline can correct if requested but does not do on purpose    Status Not Met             PT Long Term Goals - 10/17/19 1614      PT LONG TERM GOAL #1   Title He will be indpendent with all hEP issued    Time 6    Period Weeks    Status New      PT LONG TERM GOAL #2   Title His neck ROM will improve to be equal RT to LT    Time 6    Period Weeks    Status New      PT LONG TERM GOAL #3   Title He will report able to use his RT arm full ROM overhead for yard work  with mn to mod pain max pain    Time 6    Period Weeks    Status New      PT LONG TERM GOAL #4   Title He will report neck pain decreased 50% or more    Time 6    Period Weeks    Status New                 Plan - 11/14/19 1001    Clinical Impression Statement About the same.  Trisal of mechanical traction. Advised pt to assess how he feels later to see if we should do another trial.  He is doing the exercise well  He has some increased shoulder pain with this.I told him to not do to forcably at home but he needs to do the exercise daily to  maintain strength in shoulders. Also issued green band to progress in future.    PT Treatment/Interventions Passive range of motion;Dry needling;Manual techniques;Patient/family education;Therapeutic activities;Therapeutic exercise;Electrical Stimulation;Iontophoresis 66m/ml Dexamethasone;Moist Heat;Traction    PT Next Visit Plan manual and modlaities for neck       isometrics for sholder /neck added band to HEP    PT Home Exercise  Plan posture awareness with chin tucked and shoulders back, supine AROM shoulder flexion /ER/IR / Hor abduction,  red band puch/ER/ extension / row    Consulted and Agree with Plan of Care Patient           Patient will benefit from skilled therapeutic intervention in order to improve the following deficits and impairments:  Pain, Postural dysfunction,  Decreased range of motion, Increased muscle spasms, Impaired UE functional use  Visit Diagnosis: Muscle weakness (generalized)  Stiffness of neck  Abnormal posture  Cramp and spasm  Chronic right shoulder pain  Chronic left shoulder pain     Problem List Patient Active Problem List   Diagnosis Date Noted  . Exertional dyspnea 11/08/2019  . AC (acromioclavicular) joint arthritis 09/02/2019  . Diabetic neuropathy ([place]) 08/16/2019  . Chest pain of uncertain etiology 82/66/6648  . CAD -S/P PCI 01/02/2019  . Obesity (BMI 30-39.9) 01/02/2019  . Orthostatic dizziness 01/02/2019  . Hyperlipidemia LDL goal <70 12/25/2018  . History of non-ST elevation myocardial infarction (NSTEMI) 12/23/2018  . Type 2 diabetes mellitus with complication ([place])   . Tachycardia   . Hypertension     Keith Newton  PT 11/14/2019, 11:16 AM  [facility] [address] [place], [place], [address] Phone: [phone]   Fax:  [phone]  Name: Keith Newton MRN: [id number] Date of Birth: 1955-12-09

## 2019-11-15 NOTE — Addendum Note (Signed)
Addended by: Lindell Spar on: 11/15/2019 11:39 AM   Modules accepted: Orders

## 2019-11-15 NOTE — Telephone Encounter (Signed)
Per communication from cardiac rehab nurse, referral needs to be ordered under MD name. Re-entered order

## 2019-11-15 NOTE — Telephone Encounter (Signed)
Cardiac rehab referral ordered. Message sent to Gladstone Lighter RN with cardiac rehab at [facility]

## 2019-11-15 NOTE — Telephone Encounter (Signed)
Abelino Derrick, PA-C  You 2 days ago   OK for Cardiac rehab   Message text

## 2019-11-15 NOTE — Addendum Note (Signed)
Addended by: Lindell Spar on: 11/15/2019 09:01 AM   Modules accepted: Orders

## 2019-11-16 ENCOUNTER — Other Ambulatory Visit: Payer: Self-pay

## 2019-11-16 ENCOUNTER — Telehealth (HOSPITAL_COMMUNITY): Payer: Self-pay | Admitting: *Deleted

## 2019-11-16 ENCOUNTER — Ambulatory Visit: Payer: 59

## 2019-11-16 DIAGNOSIS — R252 Cramp and spasm: Secondary | ICD-10-CM

## 2019-11-16 DIAGNOSIS — M6281 Muscle weakness (generalized): Secondary | ICD-10-CM | POA: Diagnosis not present

## 2019-11-16 DIAGNOSIS — M436 Torticollis: Secondary | ICD-10-CM

## 2019-11-16 DIAGNOSIS — R293 Abnormal posture: Secondary | ICD-10-CM

## 2019-11-16 DIAGNOSIS — M25512 Pain in left shoulder: Secondary | ICD-10-CM

## 2019-11-16 DIAGNOSIS — G8929 Other chronic pain: Secondary | ICD-10-CM

## 2019-11-16 NOTE — Telephone Encounter (Signed)
Close encounter 

## 2019-11-16 NOTE — Therapy (Addendum)
[employer] [place], [place], [address] Phone: [phone]   Fax:  [phone]  Physical Therapy Treatment/Discharge  Patient Details  Name: Keith Newton MRN: [id number] Date of Birth: [birth year] Referring Provider (PT): Keith Newton , MD   Encounter Date: 11/16/2019   PT End of Session - 11/16/19 0956    Visit Number 8    Number of Visits 12    Date for PT Re-Evaluation 11/24/19    Authorization Type [employer]    PT Start Time 0955    PT Stop Time 1045    PT Time Calculation (min) 50 min    Activity Tolerance Patient limited by pain    Behavior During Therapy Flat affect           Past Medical History:  Diagnosis Date  . Arthritis   . Back pain   . CAD (coronary artery disease)    a. s/p NSTEMI in 12/2018 with 80% Prox-RCA stenosis followed by 100% prox to mid-RCA stenosis --> DESx2 to the RCA. Med management of residual disease.   . Carpal tunnel syndrome   . DDD (degenerative disc disease)   . Diabetes mellitus   . Hyperlipidemia   . Hypertension    states very mild  . Low testosterone   . Overweight   . Seasonal allergies   . Tachycardia     Past Surgical History:  Procedure Laterality Date  . CARDIAC CATHETERIZATION    . CARPAL TUNNEL RELEASE  07/02/2011   Procedure: CARPAL TUNNEL RELEASE;  Surgeon: Keith Newton., MD;  Location: [place];  Service: Orthopedics;  Laterality: Right;  . CARPAL TUNNEL RELEASE  07/30/2011   Procedure: CARPAL TUNNEL RELEASE;  Surgeon: Keith Newton., MD;  Location: [place] [facility];  Service: Orthopedics;  Laterality: Left;  . CORONARY ANGIOPLASTY    . CORONARY STENT INTERVENTION N/A 12/23/2018   Procedure: CORONARY STENT INTERVENTION;  Surgeon: Keith Booze, MD;  Location: [place] [facility];  Service: Cardiovascular;  Laterality: N/A;  . LEFT HEART CATH AND CORONARY ANGIOGRAPHY N/A 12/23/2018   Procedure: LEFT  HEART CATH AND CORONARY ANGIOGRAPHY;  Surgeon: Keith Booze, MD;  Location: [place] [facility];  Service: Cardiovascular;  Laterality: N/A;  . LUMBAR EPIDURAL INJECTION     x2    There were no vitals filed for this visit.   Subjective Assessment - 11/16/19 0957    Subjective Increased pain when I got home then pain started on RT neck . Tylenol and pain eased back to base line. Does not want to do traction again.    Pain Score 4     Pain Location Neck   and shoulders   Pain Orientation Right;Left    Pain Descriptors / Indicators Aching    Pain Type Chronic pain    Pain Onset More than a month ago    Pain Frequency Constant    Aggravating Factors  using arms    Pain Relieving Factors meds , heat              [facility] PT Assessment - 11/16/19 0001      AROM   Right Shoulder Flexion 160 Degrees    Right Shoulder ABduction 155 Degrees    Right Shoulder Internal Rotation 25 Degrees    Right Shoulder External Rotation 90 Degrees    Left Shoulder Flexion 160 Degrees    Left Shoulder ABduction 150 Degrees    Left Shoulder  Internal Rotation 50 Degrees    Left Shoulder External Rotation 90 Degrees    Cervical - Right Side Bend 38    Cervical - Left Side Bend 34    Cervical - Right Rotation 70    Cervical - Left Rotation 57                         [facility] Adult PT Treatment/Exercise - 11/16/19 0001      Shoulder Exercises: Standing   Extension Both;15 reps    Theraband Level (Shoulder Extension) Level 3 (Green)    Row Both;15 reps    Theraband Level (Shoulder Row) Level 3 (Green)    Other Standing Exercises punches x 15 green band      Shoulder Exercises: ROM/Strengthening   UBE (Upper Arm Bike) L2 5 min      Moist Heat Therapy   Number Minutes Moist Heat 10 Minutes    Moist Heat Location Cervical      Manual Therapy   Joint Mobilization Gr 2-3 to neck and upper back central and lateral    Soft tissue mobilization to the upper trarp, anterior and nd  neckshoulder,     Passive ROM side bend and rotaation RT/LT 10 sec hold x 5  with traction                    PT Short Term Goals - 11/16/19 1022      PT SHORT TERM GOAL #1   Title He will be indppendent with intial HEP    Status Achieved      PT SHORT TERM GOAL #2   Title He will have full active ROm RT shoulder    Status Achieved      PT SHORT TERM GOAL #3   Title He will report pain decreased 30-40% or more in RT shoulder with self care    Baseline Now pain comes and goes during day nad was before constant high    Status Achieved      PT SHORT TERM GOAL #4   Title He will be able to demo god posture of neck and shoulders    Baseline can correct if requested but does not do on purpose    Status Not Met             PT Long Term Goals - 11/16/19 1024      PT LONG TERM GOAL #1   Title He will be indpendent with all hEP issued    Status On-going      PT LONG TERM GOAL #2   Title His neck ROM will improve to be equal RT to LT    Baseline all improved in shoulder and neck    Status Partially Met      PT LONG TERM GOAL #3   Title He will report able to use his RT arm full ROM overhead for yard work  with mn to mod pain max pain    Baseline improved but still painful    Status On-going      PT LONG TERM GOAL #4   Title He will report neck pain decreased 50% or more    Status On-going                 Plan - 11/16/19 0956    Clinical Impression Statement Will not do traction per pt. [name] reports he is improved with decreased pain overall (mild change) but is  able to dcrease pain quicker and  has episodes each day of minimal pain. He reports RT trap and shoulder pain and on review of old MRI he had a RTC tear and degenerative changes in 2014. Suggested along wih seeing primary care MD an dtalk abotu Orthopedic referral if appropriate. He requested to be put on hold for 4 weeks to returnto MD and discuss options    PT Treatment/Interventions Passive  range of motion;Dry needling;Manual techniques;Patient/family education;Therapeutic activities;Therapeutic exercise;Electrical Stimulation;Iontophoresis 16m/ml Dexamethasone;Moist Heat;Traction    PT Next Visit Plan Place on hold per pt and he will follow up with MD.    PT Home Exercise Plan posture awareness with chin tucked and shoulders back, supine AROM shoulder flexion /ER/IR / Hor abduction,  red band puch/ER/ extension / row    Consulted and Agree with Plan of Care Patient           Patient will benefit from skilled therapeutic intervention in order to improve the following deficits and impairments:  Pain, Postural dysfunction, Decreased range of motion, Increased muscle spasms, Impaired UE functional use  Visit Diagnosis: Muscle weakness (generalized)  Stiffness of neck  Abnormal posture  Cramp and spasm  Chronic right shoulder pain  Chronic left shoulder pain     Problem List Patient Active Problem List   Diagnosis Date Noted  . Exertional dyspnea 11/08/2019  . AC (acromioclavicular) joint arthritis 09/02/2019  . Diabetic neuropathy ([place] 08/16/2019  . Chest pain of uncertain etiology 082/80/0349 . CAD -S/P PCI 01/02/2019  . Obesity (BMI 30-39.9) 01/02/2019  . Orthostatic dizziness 01/02/2019  . Hyperlipidemia LDL goal <70 12/25/2018  . History of non-ST elevation myocardial infarction (NSTEMI) 12/23/2018  . Type 2 diabetes mellitus with complication ([place]   . Tachycardia   . Hypertension     CDarrel Hoover PT 11/16/2019, 10:42 AM  [facility] [address]Phone: [phone]  Fax:  [phone] Name: Keith WillertMRN: [id number] of Birth: 107-Jun-1957 PHYSICAL THERAPY DISCHARGE SUMMARY  Visits from Start of Care: 8  Current functional level related to goals / functional outcomes: Unknown . He has seen an Ortho MD. Will discharge .    Remaining  deficits: Unknown   Education / Equipment: HEP  Plan:                                                    Patient goals were not met. Patient is being discharged due to not returning since the last visit.  ?????    SPearson Forster PT     01/22/20

## 2019-11-17 ENCOUNTER — Ambulatory Visit (HOSPITAL_COMMUNITY)
Admission: RE | Admit: 2019-11-17 | Discharge: 2019-11-17 | Disposition: A | Discharge status: Home / Self Care | Payer: 59 | Source: Ambulatory Visit | Attending: Cardiovascular Disease | Admitting: Cardiovascular Disease

## 2019-11-17 DIAGNOSIS — I10 Essential (primary) hypertension: Secondary | ICD-10-CM

## 2019-11-17 DIAGNOSIS — E669 Obesity, unspecified: Secondary | ICD-10-CM | POA: Diagnosis present

## 2019-11-17 DIAGNOSIS — R0609 Other forms of dyspnea: Secondary | ICD-10-CM

## 2019-11-17 DIAGNOSIS — Z9861 Coronary angioplasty status: Secondary | ICD-10-CM

## 2019-11-17 DIAGNOSIS — I252 Old myocardial infarction: Secondary | ICD-10-CM

## 2019-11-17 DIAGNOSIS — E118 Type 2 diabetes mellitus with unspecified complications: Secondary | ICD-10-CM

## 2019-11-17 DIAGNOSIS — R0600 Dyspnea, unspecified: Secondary | ICD-10-CM | POA: Diagnosis present

## 2019-11-17 DIAGNOSIS — I2510 Atherosclerotic heart disease of native coronary artery without angina pectoris: Secondary | ICD-10-CM

## 2019-11-17 DIAGNOSIS — E785 Hyperlipidemia, unspecified: Secondary | ICD-10-CM | POA: Diagnosis present

## 2019-11-17 DIAGNOSIS — E1142 Type 2 diabetes mellitus with diabetic polyneuropathy: Secondary | ICD-10-CM

## 2019-11-17 LAB — MYOCARDIAL PERFUSION IMAGING
LV dias vol: 79 mL (ref 62–150)
LV sys vol: 37 mL
Peak HR: 105 {beats}/min
Rest HR: 70 {beats}/min
SDS: 1
SRS: 1
SSS: 2
TID: 1.31

## 2019-11-17 MED ORDER — TECHNETIUM TC 99M TETROFOSMIN IV KIT
10.1000 | PACK | Freq: Once | INTRAVENOUS | Status: AC | PRN
  Administered 2019-11-17: 10.1 via INTRAVENOUS
  Filled 2019-11-17: qty 11

## 2019-11-17 MED ORDER — TECHNETIUM TC 99M TETROFOSMIN IV KIT
32.3000 | PACK | Freq: Once | INTRAVENOUS | Status: AC | PRN
  Administered 2019-11-17: 32.3 via INTRAVENOUS
  Filled 2019-11-17: qty 33

## 2019-11-17 MED ORDER — REGADENOSON 0.4 MG/5ML IV SOLN
0.4000 mg | Freq: Once | INTRAVENOUS | Status: AC
  Administered 2019-11-17: 0.4 mg via INTRAVENOUS

## 2019-11-17 NOTE — Addendum Note (Signed)
Addended by: Lindell Spar on: 11/17/2019 02:07 PM   Modules accepted: Orders

## 2019-11-17 NOTE — Addendum Note (Signed)
Addended by: Lindell Spar on: 11/17/2019 11:30 AM   Modules accepted: Orders

## 2019-11-20 LAB — HM DIABETES EYE EXAM

## 2019-11-21 ENCOUNTER — Telehealth: Payer: Self-pay | Admitting: Cardiology

## 2019-11-21 ENCOUNTER — Telehealth (HOSPITAL_COMMUNITY): Payer: Self-pay

## 2019-11-21 NOTE — Telephone Encounter (Signed)
Spoke with the patient and his daughter who reports that the patient has been having shortness of breath with exertion. He states that it comes and goes. He reports that he has had shortness of breath ever since his stress test on 10/01.Patient denies any swelling or weight gain. He reports some fatigue and some soreness in his chest.  Per last office visit note the patient has a history of DOE.  I reviewed results from stress test with the patient and daughter, which was normal. They verbalized understanding.

## 2019-11-21 NOTE — Telephone Encounter (Signed)
Pt c/o Shortness Of Breath: STAT if SOB developed within the last 24 hours or pt is noticeably SOB on the phone  1. Are you currently SOB (can you hear that pt is SOB on the phone)?  Yes but it is not that bad per daughter  2. How long have you been experiencing SOB? From 10/1  3. Are you SOB when sitting or when up moving around? Both   4. Are you currently experiencing any other symptoms? No   Daughter called stated that pt has started haveing SOB since 10/1 when he had the test.  They would like to know what they need to do for this   Best number [phone]

## 2019-11-21 NOTE — Telephone Encounter (Signed)
Pt insurance is active and benefits verified through [facility]. Co-pay $0.00, DED $1,500.00/$1,500.00 met, out of pocket $2,850.00/$1,176.93 met, co-insurance 30%. Yes pre-authorization required. [place], 11/21/19 @ 12:31PM, LEX#[id number]  Will contact patient to see if he is interested in the Cardiac Rehab Program.

## 2019-11-21 NOTE — Telephone Encounter (Signed)
Kirk Ruths is calling requesting a call with the patient's Lexiscan results. Please advise.

## 2019-11-21 NOTE — Telephone Encounter (Signed)
Spoke to patient 's daughter . Informed her preliminary result awaiting for final.  Informed her if she or father has not receive information @ 2- 3 days contact office  She verbalized understanding

## 2019-11-22 ENCOUNTER — Telehealth: Payer: Self-pay

## 2019-11-22 NOTE — Telephone Encounter (Signed)
-----   Message from Abelino Derrick, [place] sent at 11/22/2019 11:27 AM EDT ----- If he feels he is getting worse we need to either move up his appointment with Dr Tresa Endo or get him an office visit with an APP or DOD in the next week.  [name] PA-C 11/22/2019 11:29 AM

## 2019-11-22 NOTE — Telephone Encounter (Signed)
Spoke with the patient regarding call from yesterday 10/05 in regards to SOB.  Patient states that today it has gone away and he is feeling much better.  Will keep FU as planned.  Patient will call back if symptoms return/worsen.

## 2019-11-24 ENCOUNTER — Ambulatory Visit (INDEPENDENT_AMBULATORY_CARE_PROVIDER_SITE_OTHER): Payer: 59 | Admitting: Family Medicine

## 2019-11-24 ENCOUNTER — Encounter: Payer: Self-pay | Admitting: Family Medicine

## 2019-11-24 ENCOUNTER — Other Ambulatory Visit: Payer: Self-pay

## 2019-11-24 VITALS — BP 110/72 | HR 96 | Ht 68.0 in | Wt 251.2 lb

## 2019-11-24 DIAGNOSIS — Z1211 Encounter for screening for malignant neoplasm of colon: Secondary | ICD-10-CM | POA: Diagnosis not present

## 2019-11-24 DIAGNOSIS — E118 Type 2 diabetes mellitus with unspecified complications: Secondary | ICD-10-CM | POA: Diagnosis not present

## 2019-11-24 DIAGNOSIS — Z0000 Encounter for general adult medical examination without abnormal findings: Secondary | ICD-10-CM | POA: Insufficient documentation

## 2019-11-24 DIAGNOSIS — I10 Essential (primary) hypertension: Secondary | ICD-10-CM | POA: Diagnosis not present

## 2019-11-24 LAB — POCT UA - MICROALBUMIN
Albumin/Creatinine Ratio, Urine, POC: <130
Creatinine, POC: 30 mg/dL
Microalbumin Ur, POC: 10 mg/L

## 2019-11-24 LAB — POCT GLYCOSYLATED HEMOGLOBIN (HGB A1C): HbA1c, POC (controlled diabetic range): 9.2 % — AB (ref 0.0–7.0)

## 2019-11-24 NOTE — Progress Notes (Signed)
1 

## 2019-11-24 NOTE — Assessment & Plan Note (Signed)
BP today 110/72.  No adjustments to medications at this time.

## 2019-11-24 NOTE — Patient Instructions (Signed)
It was wonderful to meet you today. Thank you for allowing me to be a part of your care. Below is a short summary of what we discussed at your visit today:  Diabetes Your A1c today was 9.2.  This is improved slightly from 9.4 on 08/11/2019.  However, it seems as though there is not significant difference.  Please come back within a month with your daughter and all your medications so we can more fully assess what you are taking and what is working.  We cannot adjust any medications until we know what medications you are taking.  Today we obtained a urine sample for you to test for urine microalbumin.  This is a marker of how diabetes is affecting your kidneys, if at all.  Please keep following closely with your eye doctor to examine for diabetes effects in your eyes and vision.  Health maintenance Today, you were offered the flu vaccine, pneumococcal vaccine, and Tdap vaccine.  You declined all of them today, so we of course did not give them to you.  Please let us know if you have any questions about these vaccines that we can help answer.  We recommend all of these because they we believe they will keep you healthy and avoid disease.  I will refer you for a colonoscopy again, as it has been sometime since her heart attack.  Your cardiologist will have to clear you for the procedure, but it is likely that you will be able to undergo colonoscopy at this time.   If you have any questions or concerns, please do not hesitate to contact us via phone or MyChart message.   Fayette Pho, MD

## 2019-11-24 NOTE — Assessment & Plan Note (Addendum)
Referred for colonoscopy today. Apparently cardiologist told him to delay his last scheduled one because of an MI in 2019. Likely will be cleared by cardiology now.   Reports he follows with ophtho and that they "haven't found diabetes in my eyes."  Last foot exam 07/2019 by Dr. Corky Downs.

## 2019-11-24 NOTE — Progress Notes (Signed)
SUBJECTIVE:   CHIEF COMPLAINT / HPI:   When asked what he is here for today, patient reports that he does not know.  His daughter made his appointment so he showed up.  He is alone at this appointment, without his daughter and without his medications.  He cannot tell me what medications he does and does not take.   Diabetes Here for A1c testing, unsure what else.  At last visit, he was prescribed Invokana and glucometer along with an increase of Metformin from 500 BID to 1000 BID.  He cannot tell me if he takes these medications every day or how often he checks his blood sugar.  He asks how much longer he has to take the shot in his stomach.  He asks which medication he is supposed to stop taking if his blood sugar is too high, asked if that is the insulin.  After some discussion on the role of insulin and lowering blood sugar, he verbalizes understanding.  When asked if he has had episodes of hypoglycemia or hypoglycemic symptoms, he reports that he is unsure.   Reports intermittent diarrhea "couple days a week" that does not bother him.  No increase in diarrhea with increase in Metformin.  No dysuria, urinary frequency, or other symptoms of UTI after supposedly initiating Invokana.  I will ask that he return in a month or so with his daughter and all of his medications so we may accurately assess what he is taking and how to adjust.  Health maintenance Diabetes: Last foot exam in June 2021 per Dr. Lonell Face note, just did not get pulled into the health maintenance tab.  He reports that he follows up regularly with ophthalmologist, and that "the eye doctor has not found any diabetes in my eyes."  Patient consented to give urine sample for urine microalbumin measurement.  Vaccinations: patient was offered Tdap booster, pneumococcal vaccine, flu vaccine, and Covid vaccine today.  He reported that he already had his Covid vaccine.  Declines Tdap, flu, pneumococcal, stating that he "has been healthy  for the past 60 years, so I would not need that?"  When asked if he wants to talk about it, he retorted "What is there to talk about?"  When asked directly if he has questions or concerns that I could discuss with him, he says no.  Colonoscopy: patient reported he was supposed to have a colonoscopy a year or so ago but that his cardiologist told him to wait because of an MI in 2019.  He is amenable.  I have referred him again for colonoscopy, although we will need clearance from his cardiologist before anesthesia.   PERTINENT  PMH / PSH: Uncontrolled type 2 diabetes, hypertension, CAD s/p PCI, AC joint arthritis, hyperlipidemia, obesity  OBJECTIVE:   BP 110/72   Pulse 96   Ht 5\' 8"  (1.727 m)   Wt 251 lb 3.2 oz (113.9 kg)   SpO2 95%   BMI 38.19 kg/m     PHQ-9:  Depression screen [facility] 2/9 11/24/2019 09/06/2019 08/30/2019  Decreased Interest 0 0 2  Down, Depressed, Hopeless 0 0 2  PHQ - 2 Score 0 0 4  Altered sleeping 0 0 1  Tired, decreased energy 0 0 0  Change in appetite 0 0 0  Feeling bad or failure about yourself  0 0 0  Trouble concentrating 0 0 0  Moving slowly or fidgety/restless 0 0 0  Suicidal thoughts 0 0 0  PHQ-9 Score 0 0 5  Difficult doing work/chores - Not difficult at all Not difficult at all     GAD-7: No flowsheet data found.    Physical Exam General: Awake, alert, oriented Cardiovascular: Regular rate and rhythm, S1 and S2 present, no murmurs auscultated Respiratory: Lung fields clear to auscultation bilaterally Extremities: No bilateral lower extremity edema, palpable pedal and pretibial pulses bilaterally Neuro: Cranial nerves II through X grossly intact, able to move all extremities spontaneously   ASSESSMENT/PLAN:   Hypertension BP today 110/72.  No adjustments to medications at this time.  Type 2 diabetes mellitus with complication (HCC) A1c today 9.2, down slightly from 9.4 on 08/11/2019.  At last visit, patient was started on Invokana, prescribed  glucometer, and had his Metformin increased from 500 twice daily to 1000 twice daily.  At the visit today, he is alone and without medication.  Cannot tell me what he is or is not taking.  No adjustments at this time due to uncertainty of at home medication regimen.  Patient did agree to submit a urine sample to evaluate urine microalbumin, which returned normal results.  Health care maintenance Referred for colonoscopy today. Apparently cardiologist told him to delay his last scheduled one because of an MI in 2019. Likely will be cleared by cardiology now.   Reports he follows with ophtho and that they "haven't found diabetes in my eyes."  Last foot exam 07/2019 by Dr. Corky Downs.      Fayette Pho, MD [facility]

## 2019-11-24 NOTE — Assessment & Plan Note (Addendum)
A1c today 9.2, down slightly from 9.4 on 08/11/2019.  At last visit, patient was started on Invokana, prescribed glucometer, and had his Metformin increased from 500 twice daily to 1000 twice daily.  At the visit today, he is alone and without medication.  Cannot tell me what he is or is not taking.  No adjustments at this time due to uncertainty of at home medication regimen.  Patient did agree to submit a urine sample to evaluate urine microalbumin, which returned normal results.

## 2019-11-28 NOTE — Addendum Note (Signed)
Addended by: Valetta Close on: 11/28/2019 08:11 AM   Modules accepted: Orders

## 2019-12-20 NOTE — Telephone Encounter (Signed)
Faxed over authorization form for prior auth for cardiac rehab to Dr. Landry Dyke office heart care at [facility].

## 2019-12-22 ENCOUNTER — Telehealth: Payer: Self-pay

## 2019-12-22 ENCOUNTER — Other Ambulatory Visit: Payer: Self-pay | Admitting: Student

## 2019-12-22 NOTE — Telephone Encounter (Signed)
Prior authorization forms for cardiac rehab received and reviewed by Dr. Tresa Endo. Forms completed and faxed back to [facility].

## 2019-12-25 NOTE — Progress Notes (Signed)
° ° °  SUBJECTIVE:   CHIEF COMPLAINT / HPI:   Shoulder pain: Patient still has shoulder pain.  He states he talked with Dr. Montez Morita several years ago, who recommended sugery.  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  He would like a referral to orthopedics for further evaluation.   DM: The patient is unsure what medications he takes, as his daughter puts his pills in a pill box for him.  He believes he is not taking the invokana bc it was too expensive.     Diabetic neuropathy: pt still feels like his legs are 'burning'.  He rubs alcohol on them and wraps them with warm towels to help relieve the pain.  He only takes his pregabalin twice a day.  It does not make him drowsy.    Enlarged leg veins: pt states that recently the veins in both of his legs have started 'bulging out'.  They are not painful.  He has not had any leg swelling.  We discussed the mechanism of valvular incompetence in veins of the lower legs that can occur as pt's get older.  He states he has thought about getting compression stockings previously.      PERTINENT  PMH / PSH: DM  OBJECTIVE:   BP 128/78    Pulse 90    Ht 5\' 8"  (1.727 m)    Wt 251 lb 12.8 oz (114.2 kg)    SpO2 95%    BMI 38.29 kg/m   Gen: alert. Oriented. No acute distress.  CV: RRR.  Pulm: LCTAB Ext: legs symmetrical bilaterally.  Protruding, nontender superficial veins of the lower legs bilaterally. No calf tenderness.   ASSESSMENT/PLAN:   AC (acromioclavicular) joint arthritis Prior treatments have not resulted in improvement in symptoms.  Pt desires orthopedic referral.  Will send in referral.  Pt would prefer Dr. if he is still practicing.    Type 2 diabetes mellitus with complication (HCC) Pt states invokana copay is unaffordable.  Will call his pharmacy for confirmation of copay and possible alternative SGLT2 w/ better coverage.   Diabetic neuropathy (HCC) Pt only taking pregabalin twice a day.  Will increase  the dose today to 75mg  and advise him to take it three times a day.   Venous incompetence Superficial veins of the lower legs bilaterally have become protruding.  Nontender.  Not concerned for DVT given symmetry and lack of tenderness.  ABI performed today and was in the normal range bilaterally.  Advised pt he can pick up compression stockings at the pharmacy to help with venous insufficiency.       Montez Morita, MD [facility]

## 2019-12-26 ENCOUNTER — Ambulatory Visit (INDEPENDENT_AMBULATORY_CARE_PROVIDER_SITE_OTHER): Payer: 59 | Admitting: Family Medicine

## 2019-12-26 ENCOUNTER — Other Ambulatory Visit: Payer: Self-pay

## 2019-12-26 ENCOUNTER — Encounter: Payer: Self-pay | Admitting: Family Medicine

## 2019-12-26 VITALS — BP 128/78 | HR 90 | Ht 68.0 in | Wt 251.8 lb

## 2019-12-26 DIAGNOSIS — E118 Type 2 diabetes mellitus with unspecified complications: Secondary | ICD-10-CM | POA: Diagnosis not present

## 2019-12-26 DIAGNOSIS — M19011 Primary osteoarthritis, right shoulder: Secondary | ICD-10-CM

## 2019-12-26 DIAGNOSIS — E1142 Type 2 diabetes mellitus with diabetic polyneuropathy: Secondary | ICD-10-CM | POA: Diagnosis not present

## 2019-12-26 DIAGNOSIS — I872 Venous insufficiency (chronic) (peripheral): Secondary | ICD-10-CM | POA: Diagnosis not present

## 2019-12-26 MED ORDER — PREGABALIN 75 MG PO CAPS
75.0000 mg | ORAL_CAPSULE | Freq: Three times a day (TID) | ORAL | 1 refills | Status: DC
Start: 2019-12-26 — End: 2020-04-26

## 2019-12-26 NOTE — Patient Instructions (Addendum)
It was nice to see you again today,  We did the following things today:  -Your ABIs were normal which means you do not have any signs of peripheral artery disease in your legs.  -I have put in an orthopedic referral to Dr. Montez Morita.  If you have not heard from them in a week please let us know.  -I have increased your Lyrica dose to 75 mg 3 times a day.  -You can use compression stockings that you can get at any pharmacy to help with your venous swelling.  You can pick these up at any pharmacy.  -I will talk to the pharmacy about why the Invokana is so expensive and then get back to you.  You should take your Lyrica 3 times a day.  I would like to see you back in approximately 1 month.  Have a great day,  Frederic Jericho, MD

## 2019-12-28 DIAGNOSIS — I872 Venous insufficiency (chronic) (peripheral): Secondary | ICD-10-CM | POA: Insufficient documentation

## 2019-12-28 NOTE — Assessment & Plan Note (Signed)
Superficial veins of the lower legs bilaterally have become protruding.  Nontender.  Not concerned for DVT given symmetry and lack of tenderness.  ABI performed today and was in the normal range bilaterally.  Advised pt he can pick up compression stockings at the pharmacy to help with venous insufficiency.

## 2019-12-28 NOTE — Assessment & Plan Note (Signed)
Pt states invokana copay is unaffordable.  Will call his pharmacy for confirmation of copay and possible alternative SGLT2 w/ better coverage.

## 2019-12-28 NOTE — Assessment & Plan Note (Signed)
Prior treatments have not resulted in improvement in symptoms.  Pt desires orthopedic referral.  Will send in referral.  Pt would prefer Dr. Montez Morita if he is still practicing.

## 2019-12-28 NOTE — Assessment & Plan Note (Signed)
Pt only taking pregabalin twice a day.  Will increase the dose today to 75mg  and advise him to take it three times a day.

## 2020-01-04 ENCOUNTER — Encounter: Payer: Self-pay | Admitting: Orthopaedic Surgery

## 2020-01-04 ENCOUNTER — Ambulatory Visit (INDEPENDENT_AMBULATORY_CARE_PROVIDER_SITE_OTHER): Payer: 59 | Admitting: Orthopaedic Surgery

## 2020-01-04 ENCOUNTER — Other Ambulatory Visit: Payer: Self-pay

## 2020-01-04 ENCOUNTER — Ambulatory Visit: Payer: Self-pay

## 2020-01-04 DIAGNOSIS — G8929 Other chronic pain: Secondary | ICD-10-CM | POA: Diagnosis not present

## 2020-01-04 DIAGNOSIS — M25511 Pain in right shoulder: Secondary | ICD-10-CM | POA: Diagnosis not present

## 2020-01-04 NOTE — Progress Notes (Signed)
Office Visit Note   Patient: Keith Newton           Date of Birth: 02-18-1955           MRN: [id number] Visit Date: 01/04/2020              Requested by: Billey Co, MD [address] [place],  [place] [address] PCP: Sandre Kitty, MD   Assessment & Plan: Visit Diagnoses:  1. Chronic right shoulder pain     Plan: Impression is chronic right shoulder pain and rotator cuff syndrome.  Given the fact that he had a full-thickness rotator cuff tear 7 years ago that was treated nonoperatively plan with chronic pain and temporary relief from cortisone injection we will need to repeat the MRI to fully assess the extent of his shoulder pathology.  We will see him back after the MRI.  Follow-Up Instructions: Return if symptoms worsen or fail to improve.   Orders:  Orders Placed This Encounter  Procedures  . XR Shoulder Right   No orders of the defined types were placed in this encounter.     Procedures: No procedures performed   Clinical Data: No additional findings.   Subjective: Chief Complaint  Patient presents with  . Right Shoulder - Pain  . Neck - Pain  . Lower Back - Pain    Keith Newton is a very pleasant 64 year old gentleman who comes in for evaluation of chronic right shoulder pain.  He is an Licensed conveyancer at [employer].  He states that he has pain throughout his right shoulder anteriorly and deep and laterally.  He had an MRI about 7 years ago which showed a full-thickness supraspinatus tear and tendinosis of the subscapularis and he elected for nonsurgical treatment given his work schedule at that time.  He states that this has continued to bother him he has pain with essentially any use of the right arm and shoulder.  He had a cortisone injection about 2 months ago which he states only helped during the anesthetic phase.  He is not exactly sure what they injected and I was not able to track down based on records where the injection was placed.  He denies  any cervical radiculopathy.   Review of Systems  Constitutional: Negative.   All other systems reviewed and are negative.    Objective: Vital Signs: There were no vitals taken for this visit.  Physical Exam Vitals and nursing note reviewed.  Constitutional:      Appearance: He is well-developed.  HENT:     Head: Normocephalic and atraumatic.  Eyes:     Pupils: Pupils are equal, round, and reactive to light.  Pulmonary:     Effort: Pulmonary effort is normal.  Abdominal:     Palpations: Abdomen is soft.  Musculoskeletal:        General: Normal range of motion.     Cervical back: Neck supple.  Skin:    General: Skin is warm.  Neurological:     Mental Status: He is alert and oriented to person, place, and time.  Psychiatric:        Behavior: Behavior normal.        Thought Content: Thought content normal.        Judgment: Judgment normal.     Ortho Exam Right shoulder shows full active range of motion with moderate pain at the extremes.  Strength is 4/5 to manual muscle testing.  Mild pain with empty can test. Specialty  Comments:  No specialty comments available.  Imaging: XR Shoulder Right  Result Date: 01/04/2020 Moderate degenerative changes of the glenohumeral and acromioclavicular joint.  Small irregularity at the greater tuberosity.    PMFS History: Patient Active Problem List   Diagnosis Date Noted  . Venous incompetence 12/28/2019  . Health care maintenance 11/24/2019  . Exertional dyspnea 11/08/2019  . AC (acromioclavicular) joint arthritis 09/02/2019  . Diabetic neuropathy (HCC) 08/16/2019  . Chest pain of uncertain etiology 04/27/2019  . CAD -S/P PCI 01/02/2019  . Obesity (BMI 30-39.9) 01/02/2019  . Orthostatic dizziness 01/02/2019  . Hyperlipidemia LDL goal <70 12/25/2018  . History of non-ST elevation myocardial infarction (NSTEMI) 12/23/2018  . Type 2 diabetes mellitus with complication (HCC)   . Tachycardia   . Hypertension    Past  Medical History:  Diagnosis Date  . Arthritis   . Back pain   . CAD (coronary artery disease)    a. s/p NSTEMI in 12/2018 with 80% Prox-RCA stenosis followed by 100% prox to mid-RCA stenosis --> DESx2 to the RCA. Med management of residual disease.   . Carpal tunnel syndrome   . DDD (degenerative disc disease)   . Diabetes mellitus   . Hyperlipidemia   . Hypertension    states very mild  . Low testosterone   . Overweight   . Seasonal allergies   . Tachycardia     Family History  Problem Relation Age of Onset  . Stroke Mother   . CAD Father   . CAD Maternal Grandfather   . CAD Paternal Grandfather     Past Surgical History:  Procedure Laterality Date  . CARDIAC CATHETERIZATION    . CARPAL TUNNEL RELEASE  07/02/2011   Procedure: CARPAL TUNNEL RELEASE;  Surgeon: Wyn Forster., MD;  Location: [facility];  Service: Orthopedics;  Laterality: Right;  . CARPAL TUNNEL RELEASE  07/30/2011   Procedure: CARPAL TUNNEL RELEASE;  Surgeon: Wyn Forster., MD;  Location: [facility];  Service: Orthopedics;  Laterality: Left;  . CORONARY ANGIOPLASTY    . CORONARY STENT INTERVENTION N/A 12/23/2018   Procedure: CORONARY STENT INTERVENTION;  Surgeon: Corky Crafts, MD;  Location: [facility] INVASIVE CV LAB;  Service: Cardiovascular;  Laterality: N/A;  . LEFT HEART CATH AND CORONARY ANGIOGRAPHY N/A 12/23/2018   Procedure: LEFT HEART CATH AND CORONARY ANGIOGRAPHY;  Surgeon: Corky Crafts, MD;  Location: [facility]VASIVE CV LAB;  Service: Cardiovascular;  Laterality: N/A;  . LUMBAR EPIDURAL INJECTION     x2   Social History   Occupational History  . Occupation: retired  Tobacco Use  . Smoking status: Never Smoker  . Smokeless tobacco: Never Used  Vaping Use  . Vaping Use: Never used  Substance and Sexual Activity  . Alcohol use: No  . Drug use: No  . Sexual activity: Not on file

## 2020-01-05 ENCOUNTER — Telehealth: Payer: Self-pay

## 2020-01-05 NOTE — Telephone Encounter (Signed)
Patients daughter calls nurse stating Keith Newton is unaffordable. Patients daughter states she would like him to be switched to something else, or if there is anything we can do to lower cost. Please advise.

## 2020-01-05 NOTE — Telephone Encounter (Signed)
Attempted to call pt's daughter.  No answer.  Unable to leave voicemail.

## 2020-01-08 ENCOUNTER — Encounter: Payer: Self-pay | Admitting: *Deleted

## 2020-01-08 ENCOUNTER — Telehealth: Payer: Self-pay | Admitting: *Deleted

## 2020-01-08 DIAGNOSIS — Z006 Encounter for examination for normal comparison and control in clinical research program: Secondary | ICD-10-CM

## 2020-01-08 NOTE — Telephone Encounter (Signed)
LMTCB for patient's AEGIS study Visit 11 EOS phone call (due to COVID-19 Precautions )

## 2020-01-09 ENCOUNTER — Other Ambulatory Visit: Payer: Self-pay | Admitting: Orthopaedic Surgery

## 2020-01-09 DIAGNOSIS — G8929 Other chronic pain: Secondary | ICD-10-CM

## 2020-01-14 ENCOUNTER — Other Ambulatory Visit: Payer: Self-pay | Admitting: Student

## 2020-01-16 ENCOUNTER — Telehealth: Payer: Self-pay | Admitting: Orthopaedic Surgery

## 2020-01-16 NOTE — Telephone Encounter (Signed)
This is Dr. Kelly's pt. °

## 2020-01-16 NOTE — Telephone Encounter (Addendum)
Pt called wanting to make sure everything went through and was approved so when he goes for his MRI on 01/23/20 he won't have any problems. Pt would like a CB to be updated please  [phone]

## 2020-01-17 ENCOUNTER — Ambulatory Visit: Payer: 59 | Admitting: Cardiovascular Disease

## 2020-01-23 ENCOUNTER — Ambulatory Visit
Admission: RE | Admit: 2020-01-23 | Discharge: 2020-01-23 | Disposition: A | Discharge status: Home / Self Care | Payer: 59 | Source: Ambulatory Visit | Attending: Orthopaedic Surgery | Admitting: Orthopaedic Surgery

## 2020-01-23 ENCOUNTER — Telehealth: Payer: Self-pay | Admitting: Orthopaedic Surgery

## 2020-01-23 ENCOUNTER — Other Ambulatory Visit: Payer: Self-pay

## 2020-01-23 DIAGNOSIS — G8929 Other chronic pain: Secondary | ICD-10-CM

## 2020-01-23 DIAGNOSIS — M25511 Pain in right shoulder: Secondary | ICD-10-CM

## 2020-01-23 NOTE — Telephone Encounter (Signed)
Pt called asking for a CB in regards to the MRI, pt is claustrophobic and could not get into the machine; pt would like a CB to discuss some options he has   [phone]

## 2020-01-23 NOTE — Telephone Encounter (Signed)
CT arthrogram is the next best option or we can try to find an open MRI.

## 2020-01-23 NOTE — Telephone Encounter (Signed)
[name] returns call to nurse line regarding affordability of medication. Informed daughter that provider attempted to call on 11/19. Daughter apologizes, she was in the process of setting up new phone and VM had not yet been activated.   Daughter is requesting returned phone call to discuss this matter.   Please contact daughter at [phone]  Veronda Prude, RN

## 2020-01-24 ENCOUNTER — Telehealth: Payer: Self-pay | Admitting: Orthopaedic Surgery

## 2020-01-24 ENCOUNTER — Encounter: Payer: Self-pay | Admitting: Cardiology

## 2020-01-24 ENCOUNTER — Ambulatory Visit (INDEPENDENT_AMBULATORY_CARE_PROVIDER_SITE_OTHER): Payer: 59 | Admitting: Cardiology

## 2020-01-24 VITALS — BP 128/86 | HR 102 | Ht 68.0 in | Wt 253.6 lb

## 2020-01-24 DIAGNOSIS — M19011 Primary osteoarthritis, right shoulder: Secondary | ICD-10-CM

## 2020-01-24 DIAGNOSIS — I10 Essential (primary) hypertension: Secondary | ICD-10-CM | POA: Diagnosis not present

## 2020-01-24 DIAGNOSIS — I252 Old myocardial infarction: Secondary | ICD-10-CM | POA: Diagnosis not present

## 2020-01-24 DIAGNOSIS — E669 Obesity, unspecified: Secondary | ICD-10-CM

## 2020-01-24 DIAGNOSIS — E785 Hyperlipidemia, unspecified: Secondary | ICD-10-CM | POA: Diagnosis not present

## 2020-01-24 DIAGNOSIS — E118 Type 2 diabetes mellitus with unspecified complications: Secondary | ICD-10-CM

## 2020-01-24 DIAGNOSIS — I2510 Atherosclerotic heart disease of native coronary artery without angina pectoris: Secondary | ICD-10-CM

## 2020-01-24 DIAGNOSIS — Z9861 Coronary angioplasty status: Secondary | ICD-10-CM

## 2020-01-24 NOTE — Progress Notes (Signed)
Cardiology Office Note:    Date:  01/24/2020   ID:  Keith Newton, DOB 1955-07-20, MRN [id number]  PCP:  Benay Pike, MD  Cardiologist:  Shelva Majestic, MD  Electrophysiologist:  None   Referring MD: Benay Pike, MD   CC: Rt shoulder pain  History of Present Illness:    Keith Newton is a 64 y.o. male with a hx of hypertension, non-insulin-dependent diabetes, and a past evaluation for sinus tachycardia.The patient was admitted 12/23/2018 with nausea vomiting chest pain and shortness of breath. He was markedly hypertensive in the emergency room with a blood pressure of 180/114. HStroponin was greater than 27,000. He was taken to the Cath Lab where he had an intervention to 2 sites in the RCA. He tolerated this well. He does have residual 40%pLAD and 50%mCFX, and 75% PDA. Ejection fraction was 55-60% with basilar hypokinesis by echo. He was discharged 12/25/2018. He was enrolled in the [place].  He is seen in the office today in follow up from a prior visit 11/08/2019.  As usual he presents with a gruff attitude but he warms up after a few minutes. When I saw him in Sept he had c/o DOE.  Myoview was done 11/17/2019 and was low risk.  He denies any typical anginal chest pain.  He denies any increased DOE.  His main complaint is Rt shoulder pain, he is followed by Dr Erlinda Hong.   Past Medical History:  Diagnosis Date  . Arthritis   . Back pain   . CAD (coronary artery disease)    a. s/p NSTEMI in 12/2018 with 80% Prox-RCA stenosis followed by 100% prox to mid-RCA stenosis --> DESx2 to the RCA. Med management of residual disease.   . Carpal tunnel syndrome   . DDD (degenerative disc disease)   . Diabetes mellitus   . Hyperlipidemia   . Hypertension    states very mild  . Low testosterone   . Overweight   . Seasonal allergies   . Tachycardia     Past Surgical History:  Procedure Laterality Date  . CARDIAC CATHETERIZATION    . CARPAL TUNNEL RELEASE  07/02/2011    Procedure: CARPAL TUNNEL RELEASE;  Surgeon: Cammie Sickle., MD;  Location: [facility];  Service: Orthopedics;  Laterality: Right;  . CARPAL TUNNEL RELEASE  07/30/2011   Procedure: CARPAL TUNNEL RELEASE;  Surgeon: Cammie Sickle., MD;  Location: [place];  Service: Orthopedics;  Laterality: Left;  . CORONARY ANGIOPLASTY    . CORONARY STENT INTERVENTION N/A 12/23/2018   Procedure: CORONARY STENT INTERVENTION;  Surgeon: Jettie Booze, MD;  Location: [facility];  Service: Cardiovascular;  Laterality: N/A;  . LEFT HEART CATH AND CORONARY ANGIOGRAPHY N/A 12/23/2018   Procedure: LEFT HEART CATH AND CORONARY ANGIOGRAPHY;  Surgeon: Jettie Booze, MD;  Location: [facility];  Service: Cardiovascular;  Laterality: N/A;  . LUMBAR EPIDURAL INJECTION     x2    Current Medications: Current Meds  Medication Sig  . acetaminophen (TYLENOL) 325 MG tablet Take 650 mg by mouth every 6 (six) hours as needed.  Marland Kitchen aspirin EC 81 MG EC tablet Take 1 tablet (81 mg total) by mouth daily.  Marland Kitchen atorvastatin (LIPITOR) 80 MG tablet TAKE 1 TABLET BY MOUTH EVERY DAY  . Baclofen 5 MG TABS Take 5 mg by mouth at bedtime as needed.  . Blood Glucose Monitoring Suppl (ONETOUCH VERIO IQ SYSTEM) w/Device KIT Check fasting blood glucose  in AM and before giving insulin.  Marland Kitchen BRILINTA 90 MG TABS tablet TAKE 1 TABLET BY MOUTH TWO TIMES DAILY  . canagliflozin (INVOKANA) 300 MG TABS tablet Take 1 tablet (300 mg total) by mouth daily.  . chlorpheniramine (CHLOR-TRIMETON) 4 MG tablet Take 4 mg by mouth every 6 (six) hours as needed for allergies.  Marland Kitchen glucose blood (ONETOUCH VERIO) test strip Use as instructed  . insulin degludec (TRESIBA FLEXTOUCH) 100 UNIT/ML SOPN FlexTouch Pen Inject 40 Units into the skin daily.  . Insulin Pen Needle (NOVOFINE PLUS) 32G X 4 MM MISC Inject into the skin daily.  . isosorbide mononitrate (IMDUR) 60 MG 24 hr tablet Take 1 tablet (60 mg total) by  mouth daily.  Marland Kitchen JARDIANCE 25 MG TABS tablet Take 25 mg by mouth daily.  Elmore Guise Devices (ONE TOUCH DELICA LANCING DEV) MISC Check fasting blood glucose in AM and before giving insulin.  . metFORMIN (GLUCOPHAGE) 1000 MG tablet TAKE 1 TABLET BY MOUTH TWICE A DAY  . metoprolol tartrate (LOPRESSOR) 50 MG tablet Take 1 tablet (50 mg total) by mouth 2 (two) times daily.  . nitroGLYCERIN (NITROSTAT) 0.4 MG SL tablet Place 1 tablet (0.4 mg total) under the tongue every 5 (five) minutes x 3 doses as needed for chest pain.  Glory Rosebush Delica Lancets 38H MISC Check fasting blood glucose in AM and before giving insulin.  . pregabalin (LYRICA) 75 MG capsule Take 1 capsule (75 mg total) by mouth 3 (three) times daily.     Allergies:   Codeine   Social History   Socioeconomic History  . Marital status: Married    Spouse name: Murlin Schrieber  . Number of children: 2  . Years of education: 16  . Highest education level: Bachelor's degree (e.g., BA, AB, BS)  Occupational History  . Occupation: retired  Tobacco Use  . Smoking status: Never Smoker  . Smokeless tobacco: Never Used  Vaping Use  . Vaping Use: Never used  Substance and Sexual Activity  . Alcohol use: No  . Drug use: No  . Sexual activity: Not on file  Other Topics Concern  . Not on file  Social History Narrative  . Not on file   Social Determinants of Health   Financial Resource Strain: Low Risk   . Difficulty of Paying Living Expenses: Not hard at all  Food Insecurity: No Food Insecurity  . Worried About Charity fundraiser in the Last Year: Never true  . Ran Out of Food in the Last Year: Never true  Transportation Needs: No Transportation Needs  . Lack of Transportation (Medical): No  . Lack of Transportation (Non-Medical): No  Physical Activity: Inactive  . Days of Exercise per Week: 0 days  . Minutes of Exercise per Session: 0 min  Stress: No Stress Concern Present  . Feeling of Stress : Only a little  Social  Connections:   . Frequency of Communication with Friends and Family: Not on file  . Frequency of Social Gatherings with Friends and Family: Not on file  . Attends Religious Services: Not on file  . Active Member of Clubs or Organizations: Not on file  . Attends Archivist Meetings: Not on file  . Marital Status: Not on file     Family History: The patient's family history includes CAD in his father, maternal grandfather, and paternal grandfather; Stroke in his mother.  ROS:   Please see the history of present illness.     All other  systems reviewed and are negative.  EKGs/Labs/Other Studies Reviewed:    The following studies were reviewed today:  Myoview 11/17/2019-  The left ventricular ejection fraction is mildly decreased (45-54%).  Nuclear stress EF: 53%.  There was no ST segment deviation noted during stress.  The study is normal.  This is a low risk study.   Normal stress nuclear study with no ischemia or infarction; EF 53 with normal wall motion.    EKG:  EKG is not ordered today.  The ekg ordered 11/08/2019 demonstrates NSR, HR 94, poor anterior RW  Recent Labs: 03/01/2019: ALT 23; BUN 15; Creatinine, Ser 1.04; Hemoglobin 15.1; Platelets 309; Potassium 4.6; Sodium 139  Recent Lipid Panel    Component Value Date/Time   CHOL 110 03/01/2019 1051   TRIG 110 03/01/2019 1051   HDL 35 (L) 03/01/2019 1051   CHOLHDL 3.1 03/01/2019 1051   CHOLHDL 6.6 12/24/2018 0214   VLDL 25 12/24/2018 0214   LDLCALC 55 03/01/2019 1051    Physical Exam:    VS:  BP 128/86   Pulse (!) 102   Ht 5' 8"  (1.727 m)   Wt 253 lb 9.6 oz (115 kg)   BMI 38.56 kg/m     Wt Readings from Last 3 Encounters:  01/24/20 253 lb 9.6 oz (115 kg)  12/26/19 251 lb 12.8 oz (114.2 kg)  11/24/19 251 lb 3.2 oz (113.9 kg)     GEN: Obese AA male, in no acute distress HEENT: Normal NECK: No JVD; No carotid bruits CARDIAC: RRR, no murmurs, rubs, gallops RESPIRATORY:  Clear to auscultation  without rales, wheezing or rhonchi  ABDOMEN: Obese, soft,  non-distended MUSCULOSKELETAL:  No edema; No deformity  SKIN: Warm and dry NEUROLOGIC:  Alert and oriented x 3 PSYCHIATRIC:  Normal affect   ASSESSMENT:    Exertional dyspnea Myoview low risk 11/17/2019  CAD -S/P PCI S/P RCA PCI with DES x 2 12/23/2018 Residual 40% LAD, 50% CFX- EF 55%  Type 2 diabetes mellitus with complication (HCC) Followed by PCP  Hyperlipidemia LDL goal <70 On high dose statin Rx  Hypertension Controlled  History of non-ST elevation myocardial infarction (NSTEMI) S/P inferior NSTEMI 12/23/2018  Diabetic neuropathy (HCC) LE neuropathy  Obesity (BMI 30-39.9) BMI 38  Rt shoulder pain- Dr Erlinda Hong following  PLAN:    Same cardiac Rx.  He is > 12 months out since his PCI.  Recent Myoview was low risk.  He would be an acceptable candidate for shoulder surgery if needed. F/U Dr Claiborne Billings in 6 months.    Medication Adjustments/Labs and Tests Ordered: Current medicines are reviewed at length with the patient today.  Concerns regarding medicines are outlined above.  No orders of the defined types were placed in this encounter.  No orders of the defined types were placed in this encounter.   Patient Instructions  Medication Instructions:  Continue current medication  *If you need a refill on your cardiac medications before your next appointment, please call your pharmacy*   Lab Work: None Ordered   Testing/Procedures: None Ordered   Follow-Up: At [facility], you and your health needs are our priority.  As part of our continuing mission to provide you with exceptional heart care, we have created designated Provider Care Teams.  These Care Teams include your primary Cardiologist (physician) and Advanced Practice Providers (APPs -  Physician Assistants and Nurse Practitioners) who all work together to provide you with the care you need, when you need it.  We recommend signing up  for  the patient portal called "MyChart".  Sign up information is provided on this After Visit Summary.  MyChart is used to connect with patients for Virtual Visits (Telemedicine).  Patients are able to view lab/test results, encounter notes, upcoming appointments, etc.  Non-urgent messages can be sent to your provider as well.   To learn more about what you can do with MyChart, go to [email or web address].    Your next appointment:   6 month(s)  The format for your next appointment:   In Person  Provider:   You may see Shelva Majestic, MD or one of the following Advanced Practice Providers on your designated Care Team:    Almyra Deforest, PA-C  Fabian Sharp, PA-C or   Roby Lofts, PA-C        Signed, Kerin Ransom, [place]  01/24/2020 2:40 PM    Newton

## 2020-01-24 NOTE — Telephone Encounter (Signed)
[name] Margarita Grizzle called requesting a call back from Marisue Ivan about her dad appt to set an appt for CT. Please call her at [phone].

## 2020-01-24 NOTE — Patient Instructions (Signed)
Medication Instructions:  Continue current medication  *If you need a refill on your cardiac medications before your next appointment, please call your pharmacy*   Lab Work: None Ordered   Testing/Procedures: None Ordered   Follow-Up: At [facility], you and your health needs are our priority.  As part of our continuing mission to provide you with exceptional heart care, we have created designated Provider Care Teams.  These Care Teams include your primary Cardiologist (physician) and Advanced Practice Providers (APPs -  Physician Assistants and Nurse Practitioners) who all work together to provide you with the care you need, when you need it.  We recommend signing up for the patient portal called "MyChart".  Sign up information is provided on this After Visit Summary.  MyChart is used to connect with patients for Virtual Visits (Telemedicine).  Patients are able to view lab/test results, encounter notes, upcoming appointments, etc.  Non-urgent messages can be sent to your provider as well.   To learn more about what you can do with MyChart, go to [email or web address].    Your next appointment:   6 month(s)  The format for your next appointment:   In Person  Provider:   You may see Nicki Guadalajara, MD or one of the following Advanced Practice Providers on your designated Care Team:    Azalee Course, PA-C  Micah Flesher, PA-C or   Judy Pimple, [place]

## 2020-01-25 NOTE — Telephone Encounter (Signed)
Called no answer LMOM.

## 2020-01-25 NOTE — Telephone Encounter (Signed)
Pt daughter called back and stated that when pt went to have his MRI arthrogram he could not do it due to being clautrophobic and is requesting an open unit or CT. I mentioned to pt I can send over to [facility] for MRI but will probably have to go to [place] at [facility] and she did not want to do that. I told pt I will check around and see where I can send where there is an open unit

## 2020-01-25 NOTE — Telephone Encounter (Signed)
Called no answer LMOM. Just need to advise on message below.

## 2020-01-26 NOTE — Telephone Encounter (Signed)
Called no answer. Need to advise on message below.

## 2020-01-29 NOTE — Telephone Encounter (Signed)
Called daughter.  Will call pharmacy to see what sglt2 medication is covered if any.  Will discuss with patient at next appt on Wednesday.

## 2020-01-29 NOTE — Telephone Encounter (Signed)
Called and spoke with patient's daughter. He had an MRI a long time ago. He was unable to do the smaller unit and feels like he would be able to tolerate the larger open unit for his MRI. Can you please add the note and have this rescheduled for patient? [phone] Thanks!

## 2020-01-30 ENCOUNTER — Telehealth (HOSPITAL_COMMUNITY): Payer: Self-pay

## 2020-01-30 NOTE — Telephone Encounter (Signed)
Called and spoke with pt in regards to CR, pt stated he is not interested at this time due to an issue with his shoulder. Will call when he is ready to schedule.  Closed referral

## 2020-01-30 NOTE — Telephone Encounter (Signed)
Called [facility] and spoke with A.J who stated pt does not need prior auth for CR. [id number]

## 2020-01-31 ENCOUNTER — Ambulatory Visit (INDEPENDENT_AMBULATORY_CARE_PROVIDER_SITE_OTHER): Payer: 59 | Admitting: Family Medicine

## 2020-01-31 ENCOUNTER — Encounter: Payer: Self-pay | Admitting: Family Medicine

## 2020-01-31 ENCOUNTER — Other Ambulatory Visit: Payer: Self-pay

## 2020-01-31 VITALS — BP 122/60 | HR 80 | Ht 68.0 in | Wt 259.0 lb

## 2020-01-31 DIAGNOSIS — E118 Type 2 diabetes mellitus with unspecified complications: Secondary | ICD-10-CM

## 2020-01-31 DIAGNOSIS — E1142 Type 2 diabetes mellitus with diabetic polyneuropathy: Secondary | ICD-10-CM

## 2020-01-31 DIAGNOSIS — I10 Essential (primary) hypertension: Secondary | ICD-10-CM

## 2020-01-31 MED ORDER — CANAGLIFLOZIN 300 MG PO TABS
300.0000 mg | ORAL_TABLET | Freq: Every day | ORAL | 3 refills | Status: DC
Start: 2020-01-31 — End: 2020-03-21

## 2020-01-31 NOTE — Assessment & Plan Note (Signed)
Pt willing to get invokana at current price.  Pt unwilling to do an injectable such as trulicity.  - re sent invokana as a 30 day supply to help with afforability.  - f/u in jan for a1c recheck

## 2020-01-31 NOTE — Patient Instructions (Signed)
It was nice to see you again today,   For you diabetes, the invokana medication is $100 for three months, so it will be about 33 dollars per month.  There is another medication that you inject once a week that costs 15 dollars per month.  If you would like to switch to that medication please let me know.  Otherwise, the invokana should be waiting for you to pick up at the pharmacy.    We will check your kidney function today.    You can start increasing your lyrica dosing by doing the following:  - increase to 2 pills at night.  Continue this for a week.   - if symptoms not improved, you can increase to two pills in your afternoon dose as well.     Please call the orthopedist office to see if they have scheduled another MRI for you.    I would like to see you back in one month.    Have a great day,   Frederic Jericho, MD

## 2020-01-31 NOTE — Assessment & Plan Note (Signed)
Still complaining of neuropathic pain in feet.  Foot exam normal.   - increas lyrica to 2 pills at night. Can increase to 2 pills in afternoon as well if needed.

## 2020-01-31 NOTE — Progress Notes (Signed)
    SUBJECTIVE:   CHIEF COMPLAINT / HPI:   DM: discussed patient's invokana prescription and affordability.  He would be willing to pay the $30/month for the medication.  He is not interested in another injection besides the insulin.    Neuropathy: the pain in his feet still bothers him.  He takes the lyrica 75mg  three times a day.  He does not believe it makes him drowsy.    PERTINENT  PMH / PSH: dm  OBJECTIVE:   BP 122/60   Pulse 80   Ht 5\' 8"  (1.727 m)   Wt 259 lb (117.5 kg)   SpO2 96%   BMI 39.38 kg/m   Gen: alert, oriented.  No acute distress Cv: rrr Ext: normal foot exam. Normal sensation, pulses, no ulcers.   ASSESSMENT/PLAN:   Type 2 diabetes mellitus with complication (HCC) Pt willing to get invokana at current price.  Pt unwilling to do an injectable such as trulicity.  - re sent invokana as a 30 day supply to help with afforability.  - f/u in jan for a1c recheck  Diabetic neuropathy (HCC) Still complaining of neuropathic pain in feet.  Foot exam normal.   - increas lyrica to 2 pills at night. Can increase to 2 pills in afternoon as well if needed.      , MD [facility]

## 2020-02-01 LAB — BASIC METABOLIC PANEL
BUN/Creatinine Ratio: 17 (ref 10–24)
BUN: 16 mg/dL (ref 8–27)
CO2: 19 mmol/L — ABNORMAL LOW (ref 20–29)
Calcium: 10.3 mg/dL — ABNORMAL HIGH (ref 8.6–10.2)
Chloride: 101 mmol/L (ref 96–106)
Creatinine, Ser: 0.96 mg/dL (ref 0.76–1.27)
GFR calc Af Amer: 97 mL/min/{1.73_m2} (ref >59)
GFR calc non Af Amer: 84 mL/min/{1.73_m2} (ref >59)
Glucose: 182 mg/dL — ABNORMAL HIGH (ref 65–99)
Potassium: 4.6 mmol/L (ref 3.5–5.2)
Sodium: 139 mmol/L (ref 134–144)

## 2020-02-08 ENCOUNTER — Telehealth: Payer: Self-pay

## 2020-02-08 NOTE — Telephone Encounter (Signed)
[name] calls nurse line to discuss most recent medication. Advised daughter of medication changes at last visit.   Increased mood swings since increasing Lyrica dosage. Denies altered mental status. Daughter is requesting to speak with Dr. Constance Goltz in more detail regarding most recent office visit.   Please return call to patient's daughter at [phone].   Veronda Prude, RN

## 2020-02-09 NOTE — Telephone Encounter (Signed)
Spoke with daughter about decreasing dose or going back down to original dose of lyrica.  She doesn't believe it has helped his pain since increasing the dose but also feels like it has made him more drowsy.    Informed daughter that the prescription for invokana has been resent to pharmacy.

## 2020-02-28 ENCOUNTER — Telehealth: Payer: Self-pay

## 2020-02-28 NOTE — Telephone Encounter (Signed)
sw Mark with [place] called and wanted to verify that the pt did not need an aspiration of fluid or just arthrogram. sw assistant and advised that it is arthrogram with contrast. The pt is there an able to have imaging today. Is aspiration had been needed would of had to sch elsewhere.

## 2020-03-08 ENCOUNTER — Telehealth: Payer: Self-pay

## 2020-03-08 NOTE — Telephone Encounter (Signed)
Received 2 faxes from [facility] on [address] for the following:  Canagliflozin 300 mg tab - Drug no longer covered on insurance. Please consider PA.   Lyrica requires a PA for the New Year.   Sunday Spillers, CMA

## 2020-03-12 ENCOUNTER — Telehealth: Payer: Self-pay | Admitting: Orthopedic Surgery

## 2020-03-12 NOTE — Telephone Encounter (Signed)
I called patients daughter

## 2020-03-12 NOTE — Telephone Encounter (Signed)
Kirk Ruths pts daughter called wanting to make sure we received the pts MRI from [facility]. She would like a CB to confirm this please  [phone]

## 2020-03-12 NOTE — Telephone Encounter (Signed)
This patient will need a prior auth for these medications.  Can you help me set that up?

## 2020-03-12 NOTE — Telephone Encounter (Signed)
I just brought this to you. Thanks

## 2020-03-15 ENCOUNTER — Encounter: Payer: Self-pay | Admitting: Orthopaedic Surgery

## 2020-03-15 ENCOUNTER — Other Ambulatory Visit: Payer: Self-pay

## 2020-03-15 ENCOUNTER — Telehealth: Payer: Self-pay | Admitting: Orthopaedic Surgery

## 2020-03-15 ENCOUNTER — Ambulatory Visit (INDEPENDENT_AMBULATORY_CARE_PROVIDER_SITE_OTHER): Payer: 59 | Admitting: Orthopaedic Surgery

## 2020-03-15 DIAGNOSIS — G8929 Other chronic pain: Secondary | ICD-10-CM | POA: Diagnosis not present

## 2020-03-15 DIAGNOSIS — M25511 Pain in right shoulder: Secondary | ICD-10-CM | POA: Diagnosis not present

## 2020-03-15 MED ORDER — METHOCARBAMOL 500 MG PO TABS: 500.0000 mg | ORAL_TABLET | Freq: Two times a day (BID) | ORAL | 0 refills | Status: DC | PRN

## 2020-03-15 MED ORDER — TRAMADOL HCL 50 MG PO TABS: 50.0000 mg | ORAL_TABLET | Freq: Every day | ORAL | 0 refills | Status: DC | PRN

## 2020-03-15 MED ORDER — TRAMADOL HCL 50 MG PO TABS: 50.0000 mg | ORAL_TABLET | Freq: Three times a day (TID) | ORAL | 0 refills | Status: DC | PRN

## 2020-03-15 NOTE — Telephone Encounter (Signed)
Called and informed. Daughter stated understanding

## 2020-03-15 NOTE — Telephone Encounter (Signed)
Please advise 

## 2020-03-15 NOTE — Telephone Encounter (Signed)
[name] called. She says he is taking lyrica and would like to know if that is ok with the Medication prescribed today. Her call back number is [phone]

## 2020-03-15 NOTE — Progress Notes (Signed)
Office Visit Note   Patient: Keith Newton           Date of Birth: 03/22/1955           MRN: [id number] Visit Date: 03/15/2020              Requested by: Sandre Kitty, MD [address] [place],  [place] [address] PCP: Sandre Kitty, MD   Assessment & Plan: Visit Diagnoses:  1. Chronic right shoulder pain     Plan: MRI shows a chronically torn supraspinatus and subscapularis with mild degenerative SLAP tear and glenohumeral osteoarthritis.  These findings were reviewed with the patient and I do not believe that this is repairable given the chronicity.  At this point I think basically his options are to either continue to live with it versus shoulder replacement.  For now he will just live with it and treat with over-the-counter medications.  Prescription for tramadol and Robaxin.  We will see him back as needed.  Follow-Up Instructions: Return if symptoms worsen or fail to improve.   Orders:  No orders of the defined types were placed in this encounter.  Meds ordered this encounter  Medications  . traMADol (ULTRAM) 50 MG tablet    Sig: Take 1 tablet (50 mg total) by mouth 3 (three) times daily as needed.    Dispense:  30 tablet    Refill:  0  . methocarbamol (ROBAXIN) 500 MG tablet    Sig: Take 1 tablet (500 mg total) by mouth 2 (two) times daily as needed.    Dispense:  20 tablet    Refill:  0  . traMADol (ULTRAM) 50 MG tablet    Sig: Take 1-2 tablets (50-100 mg total) by mouth daily as needed.    Dispense:  20 tablet    Refill:  0      Procedures: No procedures performed   Clinical Data: No additional findings.   Subjective: Chief Complaint  Patient presents with  . Right Shoulder - Pain    Tillman returns today for follow-up of MRI of the right shoulder.  No changes to the right shoulder.   Review of Systems  Constitutional: Negative.   All other systems reviewed and are negative.    Objective: Vital Signs: There were no vitals taken for  this visit.  Physical Exam Vitals and nursing note reviewed.  Constitutional:      Appearance: He is well-developed and well-nourished.  Pulmonary:     Effort: Pulmonary effort is normal.  Abdominal:     Palpations: Abdomen is soft.  Skin:    General: Skin is warm.  Neurological:     Mental Status: He is alert and oriented to person, place, and time.  Psychiatric:        Mood and Affect: Mood and affect normal.        Behavior: Behavior normal.        Thought Content: Thought content normal.        Judgment: Judgment normal.     Ortho Exam Right shoulder exam is unchanged. Specialty Comments:  No specialty comments available.  Imaging: No results found.   PMFS History: Patient Active Problem List   Diagnosis Date Noted  . Chronic right shoulder pain 03/15/2020  . Venous incompetence 12/28/2019  . Health care maintenance 11/24/2019  . Exertional dyspnea 11/08/2019  . AC (acromioclavicular) joint arthritis 09/02/2019  . Diabetic neuropathy (HCC) 08/16/2019  . Chest pain of uncertain etiology 04/27/2019  . CAD -  S/P PCI 01/02/2019  . Obesity (BMI 30-39.9) 01/02/2019  . Orthostatic dizziness 01/02/2019  . Hyperlipidemia LDL goal <70 12/25/2018  . History of non-ST elevation myocardial infarction (NSTEMI) 12/23/2018  . Type 2 diabetes mellitus with complication (HCC)   . Tachycardia   . Hypertension    Past Medical History:  Diagnosis Date  . Arthritis   . Back pain   . CAD (coronary artery disease)    a. s/p NSTEMI in 12/2018 with 80% Prox-RCA stenosis followed by 100% prox to mid-RCA stenosis --> DESx2 to the RCA. Med management of residual disease.   . Carpal tunnel syndrome   . DDD (degenerative disc disease)   . Diabetes mellitus   . Hyperlipidemia   . Hypertension    states very mild  . Low testosterone   . Overweight   . Seasonal allergies   . Tachycardia     Family History  Problem Relation Age of Onset  . Stroke Mother   . CAD Father   . CAD  Maternal Grandfather   . CAD Paternal Grandfather     Past Surgical History:  Procedure Laterality Date  . CARDIAC CATHETERIZATION    . CARPAL TUNNEL RELEASE  07/02/2011   Procedure: CARPAL TUNNEL RELEASE;  Surgeon: Wyn Forster., MD;  Location: [facility];  Service: Orthopedics;  Laterality: Right;  . CARPAL TUNNEL RELEASE  07/30/2011   Procedure: CARPAL TUNNEL RELEASE;  Surgeon: Wyn Forster., MD;  Location: [facility];  Service: Orthopedics;  Laterality: Left;  . CORONARY ANGIOPLASTY    . CORONARY STENT INTERVENTION N/A 12/23/2018   Procedure: CORONARY STENT INTERVENTION;  Surgeon: Corky Crafts, MD;  Location: [facility];  Service: Cardiovascular;  Laterality: N/A;  . LEFT HEART CATH AND CORONARY ANGIOGRAPHY N/A 12/23/2018   Procedure: LEFT HEART CATH AND CORONARY ANGIOGRAPHY;  Surgeon: Corky Crafts, MD;  [facility];  Service: Cardiovascular;  Laterality: N/A;  . LUMBAR EPIDURAL INJECTION     x2   Social History   Occupational History  . Occupation: retired  Tobacco Use  . Smoking status: Never Smoker  . Smokeless tobacco: Never Used  Vaping Use  . Vaping Use: Never used  Substance and Sexual Activity  . Alcohol use: No  . Drug use: No  . Sexual activity: Not on file

## 2020-03-15 NOTE — Telephone Encounter (Signed)
Should be.  Best to double check with pharmacist

## 2020-03-21 ENCOUNTER — Other Ambulatory Visit: Payer: Self-pay | Admitting: Family Medicine

## 2020-03-21 ENCOUNTER — Ambulatory Visit (INDEPENDENT_AMBULATORY_CARE_PROVIDER_SITE_OTHER): Payer: 59 | Admitting: Family Medicine

## 2020-03-21 ENCOUNTER — Other Ambulatory Visit: Payer: Self-pay

## 2020-03-21 ENCOUNTER — Encounter: Payer: Self-pay | Admitting: Family Medicine

## 2020-03-21 VITALS — BP 110/70 | HR 85 | Ht 68.0 in | Wt 252.2 lb

## 2020-03-21 DIAGNOSIS — M79662 Pain in left lower leg: Secondary | ICD-10-CM | POA: Diagnosis not present

## 2020-03-21 DIAGNOSIS — M7989 Other specified soft tissue disorders: Secondary | ICD-10-CM | POA: Diagnosis not present

## 2020-03-21 DIAGNOSIS — E118 Type 2 diabetes mellitus with unspecified complications: Secondary | ICD-10-CM

## 2020-03-21 LAB — POCT GLYCOSYLATED HEMOGLOBIN (HGB A1C): Hemoglobin A1C: 8.9 % — AB (ref 4.0–5.6)

## 2020-03-21 MED ORDER — CANAGLIFLOZIN 300 MG PO TABS
300.0000 mg | ORAL_TABLET | Freq: Every day | ORAL | 3 refills | Status: DC
Start: 2020-03-21 — End: 2020-03-22

## 2020-03-21 NOTE — Patient Instructions (Signed)
Thank you for coming to see me today. It was a pleasure. Today we discussed your calf pain. We think you may have ruptured a muscle or a tendon. It is unlikely to be a clot in the legs but we will get an ultrasound of the legs. You can get this tomorrow.  Please follow-up with your PCP for follow up. I am also referring you to sports medicine.  If you have any questions or concerns, please do not hesitate to call the office at [phone].  If you develop fevers>100.5, shortness of breath, chest pain, palpitations, dizziness, abdominal pain, nausea, vomiting, diarrhea or cannot eat or drink then please go to the ER immediately.  Best wishes,   Dr Allena Katz

## 2020-03-21 NOTE — Progress Notes (Signed)
     SUBJECTIVE:   CHIEF COMPLAINT / HPI:   Keith Newton is a 65 y.o. male presents with acute left calf swelling   Daughter also present at visit.  Calf pain Pt was leaning over bed yesterday when he felt a "popping noise" behind his knee. Since then has had pain and swelling down his left calf. Took tramadol which he takes for his shoulder pain and also used rubbing ETOH, ice and heat which helped sx a little. He has not been able to walk because of how bad the pain is. Denies trauma, long haul flight, smoking, hx of PE/DVTs.   PERTINENT  PMH / PSH: CAD, MI, HTN, DM, venous insufficiency, shoulder pain  OBJECTIVE:   BP 110/70   Pulse 85   Ht 5\' 8"  (1.727 m)   Wt 252 lb 3.2 oz (114.4 kg)   SpO2 95%   BMI 38.35 kg/m    General: Alert, no distress due to pain  Cardio: well perfused Pulm: normal work of breathing Neuro: Cranial nerves grossly intact  Lower extremities: Left calf grossly edematous>right calf. Circumferential difference>3 cm. No overlying skin changes or erythema. Tenderness on palpation of entire left calf.  No tenderness on palpation of left achilles tendon, No tenderness on palpation of right calf or achilles tendon. Negative Thompson's test bilaterally Normal posterior tibial and dorsalis pulses bilaterally, normal capillary refill  ASSESSMENT/PLAN:   Swelling of calf Discussed with sports medicine fellow Dr in clinic. Differentials include rupture of tendon/muscle of calf, DVT and popliteal cyst. Low risk for DVT. Well score-2, venous Alfredo Bach of LE showed no DVT and popliteal cyst. Negative Thompson test so achilles tendon rupture less likely. Referred to sports medicine. Recommended tylenol, rest and ice for symptomatic relief in the mean time. Follow up ATC appointment on 03/25/20.  Type 2 diabetes mellitus with complication (HCC) We did not get time to address during clinic visit. A1c 8.9 improved from 9.2. Called patient to inform him A1c. Continue  Metformin 100mg  BID, Invokana 300mg  daily and Tresiba 40 units daily. To optimize control would recommend GLP-1 agonist and weaning down 05/23/20 which I have discussed with the pt. He will discuss this further at [facility] appointment on 03/25/20. Pt is happy with the plan.       [place], MD PGY-2 [facility]

## 2020-03-22 ENCOUNTER — Ambulatory Visit (HOSPITAL_COMMUNITY)
Admission: RE | Admit: 2020-03-22 | Discharge: 2020-03-22 | Disposition: A | Discharge status: Home / Self Care | Payer: 59 | Source: Ambulatory Visit | Attending: Family Medicine | Admitting: Family Medicine

## 2020-03-22 ENCOUNTER — Other Ambulatory Visit: Payer: Self-pay | Admitting: Family Medicine

## 2020-03-22 DIAGNOSIS — M79662 Pain in left lower leg: Secondary | ICD-10-CM | POA: Diagnosis not present

## 2020-03-22 NOTE — Progress Notes (Signed)
Bilateral lower extremity venous study completed.      Please see CV Proc for preliminary results.   Vivien Barretto, RVT  

## 2020-03-23 ENCOUNTER — Telehealth: Payer: Self-pay | Admitting: Family Medicine

## 2020-03-23 DIAGNOSIS — S86112A Strain of other muscle(s) and tendon(s) of posterior muscle group at lower leg level, left leg, initial encounter: Secondary | ICD-10-CM | POA: Insufficient documentation

## 2020-03-23 DIAGNOSIS — M7989 Other specified soft tissue disorders: Secondary | ICD-10-CM | POA: Insufficient documentation

## 2020-03-23 NOTE — Assessment & Plan Note (Signed)
We did not get time to address during clinic visit. A1c 8.9 improved from 9.2. Called patient to inform him A1c. Continue Metformin 100mg  BID, Invokana 300mg  daily and Tresiba 40 units daily. To optimize control would recommend GLP-1 agonist and weaning down which I have discussed with the pt. He will discuss this further at [facility] appointment on 03/25/20. Pt is happy with the plan.

## 2020-03-23 NOTE — Assessment & Plan Note (Addendum)
Discussed with sports medicine fellow Dr Alfredo Bach in clinic. Differentials include rupture of tendon/muscle of calf, DVT and popliteal cyst. Low risk for DVT. Well score-2, venous US of LE showed no DVT and popliteal cyst. Negative Thompson test so achilles tendon rupture less likely. Referred to sports medicine. Recommended tylenol, rest and ice for symptomatic relief in the mean time. Follow up ATC appointment on 03/25/20.

## 2020-03-23 NOTE — Telephone Encounter (Signed)
Called pt to let him know his venous US of LE was negative for DVT. Also discussed A1c results which we did not get time to address during clinic visit. A1c 8.9 improved from 9.2. Continue Metformin 100mg  BID, Invokana 300mg  daily and Tresiba 40 units daily. To optimize control would recommend GLP-1 agonist and weaning down which I have discussed with the pt. He will discuss this further at [facility] appointment on 03/25/20. Pt is happy with the plan.

## 2020-03-24 NOTE — Progress Notes (Deleted)
   Subjective:   Patient ID: Keith Newton    DOB: August 19, 1955, 65 y.o. male   MRN: [id number]  Keith Newton is a 65 y.o. male with a history of CAD s/p PCI, HTN, venous insufficiency, diabetic neuropathy, T2DM, AC joint arthritis, h/o chest pain, exertional dyspnea, HLD, obesity, orthostatic dizziness, swelling of calf, tachycardia here for calf pain.  Calf Pain: Patient see on 03/23/20 for calf pain. LE DVT negative or popliteal cyst. Less likely tendon rupture. He was referred to sports medicine. He was recommended to treat with tylenol, rest and ice. Today he notes ***   {Associated Symptoms:25148} {Patient Denies:25149}   Prior Imaging: POC ABI: BP-121/86  Heart Rate-94  Left ABI-1.15  Right ABI-1.24 - NORMAL Bilateral LE Doppler  03/22/20: No evidence of deep vein thrombosis seen in the lower extremities,  Bilaterally. -No evidence of popliteal cyst, bilaterally.       Review of Systems:  Per HPI.   Objective:   There were no vitals taken for this visit. Vitals and nursing note reviewed.  General: pleasant ***, sitting comfortably in exam chair, well nourished, well developed, in no acute distress with non-toxic appearance HEENT: normocephalic, atraumatic, moist mucous membranes, oropharynx clear without erythema or exudate, TM normal bilaterally  Neck: supple, non-tender without lymphadenopathy CV: regular rate and rhythm without murmurs, rubs, or gallops, no lower extremity edema, 2+ radial and pedal pulses bilaterally Lungs: clear to auscultation bilaterally with normal work of breathing on room air Resp: breathing comfortably on room air, speaking in full sentences Abdomen: soft, non-tender, non-distended, no masses or organomegaly palpable, normoactive bowel sounds Skin: warm, dry, no rashes or lesions Extremities: warm and well perfused, normal tone MSK: ROM grossly intact, strength intact, gait normal Neuro: Alert and oriented, speech normal  Assessment & Plan:   No  problem-specific Assessment & Plan notes found for this encounter.  No orders of the defined types were placed in this encounter.  No orders of the defined types were placed in this encounter.   Orpah Cobb, DO PGY-3, [facility] 03/24/2020 10:20 AM

## 2020-03-25 ENCOUNTER — Ambulatory Visit: Payer: 59

## 2020-03-25 ENCOUNTER — Telehealth: Payer: Self-pay

## 2020-03-25 NOTE — Telephone Encounter (Signed)
Patients daughter calls nurse line rescheduling apt for 2/14. Daughter reports they have an apt with his Orthopedists tomorrow, however she was told to contact PCP in regards to pain medications. Patient is on Lyrica, Tramadol, Methocarbamol, and PRN Baclofen. Daughter needs to know if all are safe to take together? Please advise.

## 2020-03-25 NOTE — Telephone Encounter (Signed)
No.  The patient can take the lyrica for his neuropathy.  Baclofen and methocarbamol are both muscle relaxants so he should not take both.  He can do baclofen PRN but should not take the methocarbamol.  All 4 of these drugs can cause drowsiness.  The tramadol is 50mg  so I would advise him to only take that sparingly for severe breakthrough pain. He shouldn't be taking it daily. He should also space out the timing of taking these medications and not drive after taking them.

## 2020-03-26 ENCOUNTER — Encounter: Payer: Self-pay | Admitting: Orthopaedic Surgery

## 2020-03-26 ENCOUNTER — Ambulatory Visit (INDEPENDENT_AMBULATORY_CARE_PROVIDER_SITE_OTHER): Payer: 59 | Admitting: Orthopaedic Surgery

## 2020-03-26 ENCOUNTER — Other Ambulatory Visit: Payer: Self-pay

## 2020-03-26 DIAGNOSIS — S86112A Strain of other muscle(s) and tendon(s) of posterior muscle group at lower leg level, left leg, initial encounter: Secondary | ICD-10-CM | POA: Diagnosis not present

## 2020-03-26 MED ORDER — HYDROCODONE-ACETAMINOPHEN 5-325 MG PO TABS
1.0000 | ORAL_TABLET | Freq: Three times a day (TID) | ORAL | 0 refills | Status: DC | PRN
Start: 2020-03-26 — End: 2020-06-03

## 2020-03-26 NOTE — Telephone Encounter (Signed)
VM left for daughter to call me back on nurse line to discuss medications.

## 2020-03-26 NOTE — Progress Notes (Signed)
Office Visit Note   Patient: Keith Newton           Date of Birth: [birth year]           MRN: [id number] Visit Date: 03/26/2020              Requested by: Westley Chandler, MD [address] [place],  [place] [address] PCP: Sandre Kitty, MD   Assessment & Plan: Visit Diagnoses:  1. Gastrocnemius strain, left, initial encounter     Plan: Impression is left gastroc strain.  I recommended continued rest, ice and compression with the sleeve as well as elevation.  Of sent in an internal referral for physical therapy as well.  He is unable to take NSAIDs and he is not getting any relief from tramadol and Robaxin stopped her agreed to call in a small prescription of Norco.  He will follow up with [place] in 3 weeks time for recheck.  Call with concerns or questions in the meantime.  Follow-Up Instructions: Return in about 3 weeks (around 04/16/2020).   Orders:  Orders Placed This Encounter  Procedures  . Ambulatory referral to Physical Therapy   Meds ordered this encounter  Medications  . HYDROcodone-acetaminophen (NORCO) 5-325 MG tablet    Sig: Take 1 tablet by mouth 3 (three) times daily as needed.    Dispense:  30 tablet    Refill:  0      Procedures: No procedures performed   Clinical Data: No additional findings.   Subjective: Chief Complaint  Patient presents with  . Left Leg - Pain  . Other     Follow up shoulder pain    HPI patient is a pleasant 65 year old gentleman who comes in today with left calf pain.  About a week ago he was leaning over his bed and felt a pop and immediate pain and swelling to the mid to proximal calf.  The swelling has improved but he still has moderate amount of pain with standing or walking.  He has been using ice and heat as well as tramadol, Robaxin and topical Voltaren without significant relief of symptoms.  He is unable to take anti-inflammatories.    Review of Systems as detailed in HPI.  All others reviewed and are  negative.   Objective: Vital Signs: There were no vitals taken for this visit.  Physical Exam well-developed and well-nourished gentleman in no acute distress.  Alert and oriented x3.  Ortho Exam left calf shows mild swelling.  Moderate tenderness along the musculotendinous junction of the gastroc.  He has increased pain with dorsiflexion and plantarflexion of the ankle.  Negative Thompson test.  He is neurovascularly intact distally.  Specialty Comments:  No specialty comments available.  Imaging: No new imaging   PMFS History: Patient Active Problem List   Diagnosis Date Noted  . Swelling of calf 03/23/2020  . Chronic right shoulder pain 03/15/2020  . Venous incompetence 12/28/2019  . Exertional dyspnea 11/08/2019  . AC (acromioclavicular) joint arthritis 09/02/2019  . Diabetic neuropathy (HCC) 08/16/2019  . Chest pain of uncertain etiology 04/27/2019  . CAD -S/P PCI 01/02/2019  . Obesity (BMI 30-39.9) 01/02/2019  . Orthostatic dizziness 01/02/2019  . Hyperlipidemia LDL goal <70 12/25/2018  . History of non-ST elevation myocardial infarction (NSTEMI) 12/23/2018  . Type 2 diabetes mellitus with complication (HCC)   . Tachycardia   . Hypertension    Past Medical History:  Diagnosis Date  . Arthritis   . Back pain   .  CAD (coronary artery disease)    a. s/p NSTEMI in 12/2018 with 80% Prox-RCA stenosis followed by 100% prox to mid-RCA stenosis --> DESx2 to the RCA. Med management of residual disease.   . Carpal tunnel syndrome   . DDD (degenerative disc disease)   . Diabetes mellitus   . Hyperlipidemia   . Hypertension    states very mild  . Low testosterone   . Overweight   . Seasonal allergies   . Tachycardia     Family History  Problem Relation Age of Onset  . Stroke Mother   . CAD Father   . CAD Maternal Grandfather   . CAD Paternal Grandfather     Past Surgical History:  Procedure Laterality Date  . CARDIAC CATHETERIZATION    . CARPAL TUNNEL RELEASE   07/02/2011   Procedure: CARPAL TUNNEL RELEASE;  Surgeon: Wyn Forster., MD;  Location: [facility];  Service: Orthopedics;  Laterality: Right;  . CARPAL TUNNEL RELEASE  07/30/2011   Procedure: CARPAL TUNNEL RELEASE;  Surgeon: Wyn Forster., MD;  Location: [facility];  Service: Orthopedics;  Laterality: Left;  . CORONARY ANGIOPLASTY    . CORONARY STENT INTERVENTION N/A 12/23/2018   Procedure: CORONARY STENT INTERVENTION;  Surgeon: Corky Crafts, MD;  Location[facility] INVAS[facility] CV LAB;  Service: Cardiovascular;  Laterality: N/A;  . LEFT HEART CATH AND CORONARY ANGIOGRAPHY N/A 12/23/2018   Procedure: LEFT HEART CATH AND CORONARY ANGIOGRAPHY;  Surgeon: Corky Crafts, MD;  Location: [facility] INVASIVE CV LAB;  Service: Cardiovascular;  Laterality: N/A;  . LUMBAR EPIDURAL INJECTION     x2   Social History   Occupational History  . Occupation: retired  Tobacco Use  . Smoking status: Never Smoker  . Smokeless tobacco: Never Used  Vaping Use  . Vaping Use: Never used  Substance and Sexual Activity  . Alcohol use: No  . Drug use: No  . Sexual activity: Not on file

## 2020-03-27 ENCOUNTER — Other Ambulatory Visit: Payer: Self-pay | Admitting: Family Medicine

## 2020-03-27 MED ORDER — DAPAGLIFLOZIN PROPANEDIOL 10 MG PO TABS: 10.0000 mg | ORAL_TABLET | Freq: Every day | ORAL | 3 refills | Status: DC

## 2020-03-27 NOTE — Progress Notes (Signed)
Changed from invokana to farxiga due to insurance coverage changes.

## 2020-03-27 NOTE — Telephone Encounter (Signed)
Spoke with daughter.  Recommended only taking lyrica and his hydrocodone to avoid over-sedation.  I have an appointment with him next Monday. We can discuss it further at this time.

## 2020-03-27 NOTE — Telephone Encounter (Signed)
[name] returns call to nurse line regarding medications. Daughter reports that patient was prescribed hydrocodone-acetaminophen yesterday by orthopedic. Daughter expresses confusion with how they should proceed with medication management.   Spoke with Dr. Constance Goltz regarding patient. Dr. Constance Goltz will call patient's daughter back later today to discuss medications.   Veronda Prude, RN

## 2020-03-30 ENCOUNTER — Other Ambulatory Visit: Payer: Self-pay | Admitting: Family Medicine

## 2020-04-01 ENCOUNTER — Other Ambulatory Visit: Payer: Self-pay

## 2020-04-01 ENCOUNTER — Ambulatory Visit (INDEPENDENT_AMBULATORY_CARE_PROVIDER_SITE_OTHER): Payer: 59 | Admitting: Family Medicine

## 2020-04-01 ENCOUNTER — Encounter: Payer: Self-pay | Admitting: Family Medicine

## 2020-04-01 DIAGNOSIS — S86112D Strain of other muscle(s) and tendon(s) of posterior muscle group at lower leg level, left leg, subsequent encounter: Secondary | ICD-10-CM

## 2020-04-01 DIAGNOSIS — G8929 Other chronic pain: Secondary | ICD-10-CM | POA: Diagnosis not present

## 2020-04-01 DIAGNOSIS — M25511 Pain in right shoulder: Secondary | ICD-10-CM

## 2020-04-01 DIAGNOSIS — E118 Type 2 diabetes mellitus with unspecified complications: Secondary | ICD-10-CM

## 2020-04-01 MED ORDER — MEDICAL COMPRESSION STOCKINGS MISC: 1 refills | Status: DC

## 2020-04-01 MED ORDER — TRULICITY 0.75 MG/0.5ML ~~LOC~~ SOAJ: 0.7500 mg | SUBCUTANEOUS | 1 refills | Status: DC

## 2020-04-01 NOTE — Assessment & Plan Note (Signed)
Advised patient he can stop taking the muscle relaxants since they are not helping.  Advised him to continue the Lyrica for his neuropathic pain as well as the Norco that he was recently prescribed.  Advised patient to not take the tramadol anymore.

## 2020-04-01 NOTE — Assessment & Plan Note (Signed)
Discussed with patient stretching and light exercise (walking).  Informed him that the compression stockings for venous incompetence will serve the same purpose as a compression sleeve on his calf that was prescribed by the orthopedist.  Gave written prescription for compression stockings and advised them to take it to a medical supply store or pharmacy.

## 2020-04-01 NOTE — Patient Instructions (Signed)
It was nice to see you again today,  We did the following changes today: -I am going to start you on a once a week injectable diabetes medication.  Your pharmacy will call you when this is ready to pick up. -You can continue taking the pain medication Norco as prescribed.  Please stop taking the Robaxin and the tramadol and the baclofen. -Continue taking the Lyrica -Please pick up the compression stockings from a medical supply store or the pharmacy.  I have given you a written prescription.  This may help with the payment but you may have to pay full price. -6 months before you turn 66 you should call Medicare to start the process.  I would like to see you back in 3 months or sooner if needed.  Have a great day,  Frederic Jericho, MD

## 2020-04-01 NOTE — Assessment & Plan Note (Signed)
Will start Trulicity.  0.75 mg weekly.  Demonstrated how to use with the patient in the encounter.

## 2020-04-01 NOTE — Progress Notes (Signed)
    SUBJECTIVE:   CHIEF COMPLAINT / HPI:   Pain medications.:  Patient states that he does not get relief from the muscle relaxants.  The Norco that he recently started helps briefly but the relief does not last.  He is takes it 3 times a day.  Feels like he may be unsteady when he first stands up.  DM: Patient is okay with doing a once a week injectable medication for his diabetes in addition to the [place].  Discussed dieting as the best treatment for diabetes.  Left leg pain: Patient still having tenderness in the left leg and pain with ambulation.  We talked about compression stockings and how they can help his venous incompetence and also his calf strain.  We talked about the difference between these socks and regular cotton socks and how regular cotton socks are inadequate for compressive purposes.   PERTINENT  PMH / PSH: DM  OBJECTIVE:   BP 116/72   Pulse 100   Ht 5\' 8"  (1.727 m)   Wt 250 lb 9.6 oz (113.7 kg)   SpO2 94%   BMI 38.10 kg/m   Gen: alert, oriented. No acute distress.  Msk: some TTP of the left calf.  Mild swelling near the medial gastrocnemius.  Psych: pleasant affect.   ASSESSMENT/PLAN:   Chronic right shoulder pain Advised patient he can stop taking the muscle relaxants since they are not helping.  Advised him to continue the Lyrica for his neuropathic pain as well as the Norco that he was recently prescribed.  Advised patient to not take the tramadol anymore.  Type 2 diabetes mellitus with complication (HCC) Will start Trulicity.  0.75 mg weekly.  Demonstrated how to use with the patient in the encounter.  Gastrocnemius strain, left Discussed with patient stretching and light exercise (walking).  Informed him that the compression stockings for venous incompetence will serve the same purpose as a compression sleeve on his calf that was prescribed by the orthopedist.  Gave written prescription for compression stockings and advised them to take it  to a medical supply store or pharmacy.     , MD [facility]

## 2020-04-06 ENCOUNTER — Ambulatory Visit: Payer: 59 | Attending: Physician Assistant

## 2020-04-06 ENCOUNTER — Other Ambulatory Visit: Payer: Self-pay

## 2020-04-06 DIAGNOSIS — S86112A Strain of other muscle(s) and tendon(s) of posterior muscle group at lower leg level, left leg, initial encounter: Secondary | ICD-10-CM | POA: Diagnosis not present

## 2020-04-06 DIAGNOSIS — M79662 Pain in left lower leg: Secondary | ICD-10-CM

## 2020-04-06 DIAGNOSIS — M6281 Muscle weakness (generalized): Secondary | ICD-10-CM | POA: Insufficient documentation

## 2020-04-06 DIAGNOSIS — M25672 Stiffness of left ankle, not elsewhere classified: Secondary | ICD-10-CM | POA: Diagnosis present

## 2020-04-06 DIAGNOSIS — R262 Difficulty in walking, not elsewhere classified: Secondary | ICD-10-CM | POA: Insufficient documentation

## 2020-04-06 DIAGNOSIS — R2689 Other abnormalities of gait and mobility: Secondary | ICD-10-CM | POA: Insufficient documentation

## 2020-04-07 NOTE — Therapy (Addendum)
[facility] Outpatient Rehabilitation [facility] [address] [place], [place], [address] Phone: [phone]   Fax:  [phone]  Physical Therapy Evaluation  Patient Details  Name: Keith Newton MRN: [id number] Date of Birth: 02/15/1956 Referring Provider (PT): Cristie Hem, [place]   Encounter Date: 04/06/2020   PT End of Session - 04/07/20 2335    Visit Number 1    Number of Visits 9    Date for PT Re-Evaluation 05/11/20    PT Start Time 0907    PT Stop Time 0955    PT Time Calculation (min) 48 min    Activity Tolerance Patient tolerated treatment well    Behavior During Therapy [facility] for tasks assessed/performed           Past Medical History:  Diagnosis Date  . Arthritis   . Back pain   . CAD (coronary artery disease)    a. s/p NSTEMI in 12/2018 with 80% Prox-RCA stenosis followed by 100% prox to mid-RCA stenosis --> DESx2 to the RCA. Med management of residual disease.   . Carpal tunnel syndrome   . DDD (degenerative disc disease)   . Diabetes mellitus   . Hyperlipidemia   . Hypertension    states very mild  . Low testosterone   . Overweight   . Seasonal allergies   . Tachycardia     Past Surgical History:  Procedure Laterality Date  . CARDIAC CATHETERIZATION    . CARPAL TUNNEL RELEASE  07/02/2011   Procedure: CARPAL TUNNEL RELEASE;  Surgeon: Wyn Forster., MD;  Location: [facility];  Service: Orthopedics;  Laterality: Right;  . CARPAL TUNNEL RELEASE  07/30/2011   Procedure: CARPAL TUNNEL RELEASE;  Surgeon: Wyn Forster., MD;  Location: [facility];  Service: Orthopedics;  Laterality: Left;  . CORONARY ANGIOPLASTY    . CORONARY STENT INTERVENTION N/A 12/23/2018   Procedure: CORONARY STENT INTERVENTION;  Surgeon: Corky Crafts, MD;  Location: [facility] INVASIVE CV LAB;  Service: Cardiovascular;  Laterality: N/A;  . LEFT HEART CATH AND CORONARY ANGIOGRAPHY N/A 12/23/2018   Procedure: LEFT HEART CATH AND  CORONARY ANGIOGRAPHY;  Surgeon: Corky Crafts, MD;  Location: [facility] INVASIVE CV LAB;  Service: Cardiovascular;  Laterality: N/A;  . LUMBAR EPIDURAL INJECTION     x2    There were no vitals filed for this visit.    Subjective Assessment - 04/07/20 2211    Subjective Pt reports 3-4 weeks ago feeling a "pop' in his L calf when in standing he reached across the bed. Initially he experience pain and swelling. Pt reports theL calf in improving slowly with the swelling more so than the pain.    Patient is accompained by: Family member   daugther   Limitations Standing;Walking    Patient Stated Goals For the pain to improve so he can walk comfortably.    Currently in Pain? Yes    Pain Score 4    pain range 3-9/10   Pain Location Calf    Pain Orientation Left;Posterior;Mid    Pain Descriptors / Indicators Aching    Pain Type Acute pain    Pain Onset 1 to 4 weeks ago    Pain Frequency Constant    Aggravating Factors  Initial standing/walking after sleeping or sitting for a while    Pain Relieving Factors heat/cold, rest    Effect of Pain on Daily Activities Limits mobility  [facility] PT Assessment - 04/07/20 0001      Assessment   Medical Diagnosis Gastrocnemius strain, left, initial encounter    Referring Provider (PT) Cristie Hem, PA-C    Onset Date/Surgical Date --   3-4 weeks ago   Hand Dominance Right    Next MD Visit 04/16/20    Prior Therapy No      Precautions   Precautions None      Restrictions   Weight Bearing Restrictions No      Balance Screen   Has the patient fallen in the past 6 months No      Home Environment   Living Environment Private residence    Living Arrangements Spouse/significant other;Children    Type of Home House    Home Access Stairs to enter    Entrance Stairs-Number of Steps 6    Entrance Stairs-Rails Left    Home Layout One level      Prior Function   Level of Independence Independent    Vocation Retired       IT consultant   Overall Cognitive Status Within Functional Limits for tasks assessed      Observation/Other Assessments   Focus on Therapeutic Outcomes (FOTO)  49% functional aility      Sensation   Light Touch Appears Intact   Pt reports pins/needles of both feet     ROM / Strength   AROM / PROM / Strength AROM;PROM;Strength      AROM   Overall AROM Comments Ankle DF L=5d, R=10d. Increased pain near endrange of L DF      PROM   Overall PROM Comments Painful with PROM into DF      Strength   Overall Strength Comments L PF approx 3/5 and limited by pain. L DF, Ev, Inv 5/5.      Palpation   Palpation comment TTP medial calf and to the musclotendinous juction      Transfers   Transfers Sit to Stand;Stand to Sit    Sit to Stand 6: Modified independent (Device/Increase time)   decreased WBing L foot     Ambulation/Gait   Ambulation/Gait Yes    Ambulation/Gait Assistance 6: Modified independent (Device/Increase time)    Gait Pattern Step-to pattern;Decreased stance time - right;Antalgic    Gait velocity decreased pace                      Objective measurements completed on examination: See above findings.               PT Education - 04/07/20 2337    Education Details Eval findings, POC, HEP, use of cold pack and/or heating pad to manage pain and/or swelling, use of roller for calf massge, recommendation for pt to complete gentle DF stretch and AROM to warm up the calf prior to standing and walking after sleeping or prolonged sitting. Use SPC to reduce strain and pain during healing in acute injury phase.    Person(s) Educated Patient    Methods Explanation;Demonstration;Tactile cues;Verbal cues;Handout    Comprehension Returned demonstration;Verbal cues required;Tactile cues required;Need further instruction;Verbalized understanding            PT Short Term Goals - 04/07/20 2247      PT SHORT TERM GOAL #1   Title He will be indppendent with intial  HEP    Baseline Started on eval    Status New    Target Date 04/28/20      PT SHORT TERM GOAL #  2   Title Pt will voice undertsanding of messure to assist in the reduction of pain and swelling    Status New    Target Date 04/28/20             PT Long Term Goals - 04/07/20 2250      PT LONG TERM GOAL #1   Title Pt will be Ind in a final HEP to maintain or progress achieved LOF    Status New    Target Date 05/11/20      PT LONG TERM GOAL #2   Title Increase L ankle DF AROM to 10d for iproved function and gait of the L ankle/UE    Baseline 5d    Status New    Target Date 05/11/20      PT LONG TERM GOAL #3   Title Increased L ankle PF strength to 4+/5 strength for improved function of the L ankle/LE    Status New    Target Date 05/11/20      PT LONG TERM GOAL #4   Title Pt will report a decrease in L calf pain to 3/10 or less with daily activities    Status New    Target Date 05/11/20      PT LONG TERM GOAL #5   Title Pt will walk with a normalized gait pattern with equal step length and heel to toe pattern s an antalgic limp    Status New    Target Date 05/11/20      Additional Long Term Goals   Additional Long Term Goals Yes      PT LONG TERM GOAL #6   Title PT's FOTO score will improve to predicted value of 71% functional ability    Baseline 49%    Status New    Target Date 05/11/20                  Plan - 04/07/20 2233    Clinical Impression Statement Pt presents with signs and symptoms of a L calf strain. Pt is TTP to the L medial calf and the musculotendinous junction, and presents with decreased ankle DF AROM, and decreased plantar flexion strength being limited by pain. Additionally, the pt walks with an antalgic gait pattern over the L LE. Pt will benefit from skilled PT for ROM, strengthening, gait training, and the use of manual techniques and modalities to decrease pain and optimize function of the L ankle/LE.    Personal Factors and  Comorbidities Fitness;Comorbidity 3+    Comorbidities obesity, CAD, HTN, tachycardia, DM    Examination-Activity Limitations Transfers;Squat;Stairs;Stand;Locomotion Level    Stability/Clinical Decision Making Stable/Uncomplicated    Clinical Decision Making Low    Rehab Potential Good    PT Frequency 2x / week    PT Duration 4 weeks    PT Treatment/Interventions ADLs/Self Care Home Management;Cryotherapy;Electrical Stimulation;Ultrasound;Moist Heat;Iontophoresis 4mg /ml Dexamethasone;Gait training;Stair training;Functional mobility training;Therapeutic activities;Therapeutic exercise;Balance training;Patient/family education;Manual techniques;Dry needling;Passive range of motion;Taping;Vasopneumatic Device    PT Next Visit Plan Assess response to HEP, review FOTO, use manuel techniques and modalities as indicated    PT Home Exercise Plan [email or web address]    Consulted and Agree with Plan of Care Patient           Patient will benefit from skilled therapeutic intervention in order to improve the following deficits and impairments:  Abnormal gait,Decreased range of motion,Difficulty walking,Obesity,Decreased activity tolerance,Pain,Decreased strength,Decreased balance  Visit Diagnosis: Gastrocnemius strain, left, initial encounter - Plan: PT plan of care cert/re-cert  Decreased range of motion of left ankle - Plan: PT plan of care cert/re-cert  Difficulty in walking, not elsewhere classified - Plan: PT plan of care cert/re-cert  Muscle weakness (generalized) - Plan: PT plan of care cert/re-cert  Pain of left calf - Plan: PT plan of care cert/re-cert  Other abnormalities of gait and mobility - Plan: PT plan of care cert/re-cert     Problem List Patient Active Problem List   Diagnosis Date Noted  . Gastrocnemius strain, left 03/23/2020  . Venous incompetence 12/28/2019  . Exertional dyspnea 11/08/2019  . Chronic right shoulder pain 09/02/2019  . Diabetic neuropathy (HCC) 08/16/2019  .  Chest pain of uncertain etiology 04/27/2019  . CAD -S/P PCI 01/02/2019  . Obesity (BMI 30-39.9) 01/02/2019  . Orthostatic dizziness 01/02/2019  . Hyperlipidemia LDL goal <70 12/25/2018  . History of non-ST elevation myocardial infarction (NSTEMI) 12/23/2018  . Type 2 diabetes mellitus with complication (HCC)   . Tachycardia   . Hypertension     Joellyn Ruedllen Shyonna Carlin MS, PT 04/08/20 12:37 AM  [facility] [address] [place], [place], [address] Phone: [phone]   Fax:  [phone]  Name: Keith Newton MRN: [id number] Date of Birth: 05-16-1955

## 2020-04-08 ENCOUNTER — Encounter: Payer: Self-pay | Admitting: Physical Therapy

## 2020-04-08 ENCOUNTER — Other Ambulatory Visit: Payer: Self-pay

## 2020-04-08 ENCOUNTER — Ambulatory Visit: Payer: 59 | Admitting: Physical Therapy

## 2020-04-08 DIAGNOSIS — M79662 Pain in left lower leg: Secondary | ICD-10-CM

## 2020-04-08 DIAGNOSIS — M6281 Muscle weakness (generalized): Secondary | ICD-10-CM

## 2020-04-08 DIAGNOSIS — R262 Difficulty in walking, not elsewhere classified: Secondary | ICD-10-CM

## 2020-04-08 DIAGNOSIS — M25672 Stiffness of left ankle, not elsewhere classified: Secondary | ICD-10-CM

## 2020-04-08 DIAGNOSIS — S86112A Strain of other muscle(s) and tendon(s) of posterior muscle group at lower leg level, left leg, initial encounter: Secondary | ICD-10-CM

## 2020-04-08 DIAGNOSIS — R2689 Other abnormalities of gait and mobility: Secondary | ICD-10-CM

## 2020-04-08 NOTE — Therapy (Signed)
[facility] Outpatient Rehabilitation [facility] [address] [place], [place], [address] Phone: [phone]   Fax:  [phone]  Physical Therapy Treatment  Patient Details  Name: Keith Newton MRN: [id number] Date of Birth: 1955-05-14 Referring Provider (PT): Cristie Hem, [place]   Encounter Date: 04/08/2020   PT End of Session - 04/08/20 0933    Visit Number 2    Number of Visits 9    Date for PT Re-Evaluation 05/11/20    Authorization Type [employer]    PT Start Time 1000    PT Stop Time 1045    PT Time Calculation (min) 45 min    Activity Tolerance Patient tolerated treatment well    Behavior During Therapy [facility] for tasks assessed/performed           Past Medical History:  Diagnosis Date  . Arthritis   . Back pain   . CAD (coronary artery disease)    a. s/p NSTEMI in 12/2018 with 80% Prox-RCA stenosis followed by 100% prox to mid-RCA stenosis --> DESx2 to the RCA. Med management of residual disease.   . Carpal tunnel syndrome   . DDD (degenerative disc disease)   . Diabetes mellitus   . Hyperlipidemia   . Hypertension    states very mild  . Low testosterone   . Overweight   . Seasonal allergies   . Tachycardia     Past Surgical History:  Procedure Laterality Date  . CARDIAC CATHETERIZATION    . CARPAL TUNNEL RELEASE  07/02/2011   Procedure: CARPAL TUNNEL RELEASE;  Surgeon: Wyn Forster., MD;  Location: [facility];  Service: Orthopedics;  Laterality: Right;  . CARPAL TUNNEL RELEASE  07/30/2011   Procedure: CARPAL TUNNEL RELEASE;  Surgeon: Wyn Forster., MD;  Location: [facility];  Service: Orthopedics;  Laterality: Left;  . CORONARY ANGIOPLASTY    . CORONARY STENT INTERVENTION N/A 12/23/2018   Procedure: CORONARY STENT INTERVENTION;  Surgeon: Corky Crafts, MD;  Location: [facility] INVASIVE CV LAB;  Service: Cardiovascular;  Laterality: N/A;  . LEFT HEART CATH AND CORONARY ANGIOGRAPHY N/A 12/23/2018    Procedure: LEFT HEART CATH AND CORONARY ANGIOGRAPHY;  Surgeon: Corky Crafts, MD;  Location: [facility] INVASIVE CV LAB;  Service: Cardiovascular;  Laterality: N/A;  . LUMBAR EPIDURAL INJECTION     x2    There were no vitals filed for this visit.   Subjective Assessment - 04/08/20 1003    Subjective Patient reports calf is the same, still waking him up at night. Pain moves around the calf, he found a tender spot in the back of the knee last night and this has been bothering him. States when he rubs his calf it feels as if he is bumping around a vein and notes it feels like he broke something loose.    Patient Stated Goals For the pain to improve so he can walk comfortably.    Currently in Pain? Yes    Pain Score 8     Pain Location Calf    Pain Orientation Left    Pain Descriptors / Indicators Nagging   "like somene is choking you"   Pain Type Acute pain    Pain Onset 1 to 4 weeks ago    Pain Frequency Constant                             [facility] Adult PT Treatment/Exercise - 04/08/20 0001  Exercises   Exercises Ankle      Manual Therapy   Manual Therapy Soft tissue mobilization;Passive ROM    Soft tissue mobilization Light effleurage type STM throughout left calf    Passive ROM Manual calf stretch in supine      Ankle Exercises: Stretches   Gastroc Stretch 3 reps;20 seconds    Gastroc Stretch Limitations seated edge of mat with strap      Ankle Exercises: Aerobic   Nustep L5 x 5 min with LE only      Ankle Exercises: Standing   Other Standing Ankle Exercises Gait training: instructed on proper-heel toe progression      Ankle Exercises: Seated   Heel Raises 10 reps   2 sets   Toe Raise 10 reps   2 sets   Other Seated Ankle Exercises Banded ankle PF with yellow 2 x 10                  PT Education - 04/08/20 0932    Education Details HEP update, FOTO review    Person(s) Educated Patient    Methods Explanation;Demonstration;Tactile  cues;Verbal cues;Handout    Comprehension Verbalized understanding;Returned demonstration;Verbal cues required;Tactile cues required;Need further instruction            PT Short Term Goals - 04/07/20 2247      PT SHORT TERM GOAL #1   Title He will be indppendent with intial HEP    Baseline Started on eval    Status New    Target Date 04/28/20      PT SHORT TERM GOAL #2   Title Pt will voice undertsanding of messure to assist in the reduction of pain and swelling    Status New    Target Date 04/28/20             PT Long Term Goals - 04/07/20 2250      PT LONG TERM GOAL #1   Title Pt will be Ind in a final HEP to maintain or progress achieved LOF    Status New    Target Date 05/11/20      PT LONG TERM GOAL #2   Title Increase L ankle DF AROM to 10d for iproved function and gait of the L ankle/UE    Baseline 5d    Status New    Target Date 05/11/20      PT LONG TERM GOAL #3   Title Increased L ankle PF strength to 4+/5 strength for improved function of the L ankle/LE    Status New    Target Date 05/11/20      PT LONG TERM GOAL #4   Title Pt will report a decrease in L calf pain to 3/10 or less with daily activities    Status New    Target Date 05/11/20      PT LONG TERM GOAL #5   Title Pt will walk with a normalized gait pattern with equal step length and heel to toe pattern s an antalgic limp    Status New    Target Date 05/11/20      Additional Long Term Goals   Additional Long Term Goals Yes      PT LONG TERM GOAL #6   Title PT's FOTO score will improve to predicted value of 71% functional ability    Baseline 49%    Status New    Target Date 05/11/20  Plan - 04/08/20 0933    Clinical Impression Statement Patient continues to report high levels of pain with activity and unable to tolerate much manual and exercise this visit. Patient reports high levels of pain with light palpation diffusely throughout achilles, calf, and popliteal  region. Patient also required consistent cueing for performing exercises through comfortable range as patient would express audible discomfort and grimace. He did exhibit improved gait following cues for heel-toe progression, but continues with decreased gait speed and pain. Patient would benefit from continued skilled PT to progress mobility and strength to reduce pain and improve walking ability.    PT Treatment/Interventions ADLs/Self Care Home Management;Cryotherapy;Electrical Stimulation;Ultrasound;Moist Heat;Iontophoresis 4mg /ml Dexamethasone;Gait training;Stair training;Functional mobility training;Therapeutic activities;Therapeutic exercise;Balance training;Patient/family education;Manual techniques;Dry needling;Passive range of motion;Taping;Vasopneumatic Device    PT Next Visit Plan Assess response to HEP and progress PRN, use manuel techniques and modalities as indicated, progress calf strength and gait training    PT Home Exercise Plan [email or web address] - added light calf strengthening    Consulted and Agree with Plan of Care Patient           Patient will benefit from skilled therapeutic intervention in order to improve the following deficits and impairments:  Abnormal gait,Decreased range of motion,Difficulty walking,Obesity,Decreased activity tolerance,Pain,Decreased strength,Decreased balance  Visit Diagnosis: Gastrocnemius strain, left, initial encounter  Decreased range of motion of left ankle  Difficulty in walking, not elsewhere classified  Muscle weakness (generalized)  Pain of left calf  Other abnormalities of gait and mobility     Problem List Patient Active Problem List   Diagnosis Date Noted  . Gastrocnemius strain, left 03/23/2020  . Venous incompetence 12/28/2019  . Exertional dyspnea 11/08/2019  . Chronic right shoulder pain 09/02/2019  . Diabetic neuropathy (HCC) 08/16/2019  . Chest pain of uncertain etiology 04/27/2019  . CAD -S/P PCI 01/02/2019  . Obesity  (BMI 30-39.9) 01/02/2019  . Orthostatic dizziness 01/02/2019  . Hyperlipidemia LDL goal <70 12/25/2018  . History of non-ST elevation myocardial infarction (NSTEMI) 12/23/2018  . Type 2 diabetes mellitus with complication (HCC)   . Tachycardia   . Hypertension     13/07/2018, PT, DPT, LAT, ATC 04/08/20  11:10 AM Phone: [phone] Fax: [phone]   [facility] Outpatient Rehabilitation [facility] [address] [place], [place], [place] Phone: [phone]   Fax:  [phone]  Name: Keith Newton MRN: Keith Newton Date of Birth: 07-Apr-1955

## 2020-04-10 ENCOUNTER — Other Ambulatory Visit: Payer: Self-pay

## 2020-04-10 ENCOUNTER — Telehealth: Payer: Self-pay

## 2020-04-10 ENCOUNTER — Ambulatory Visit (INDEPENDENT_AMBULATORY_CARE_PROVIDER_SITE_OTHER): Payer: 59 | Admitting: Orthopedic Surgery

## 2020-04-10 DIAGNOSIS — M75121 Complete rotator cuff tear or rupture of right shoulder, not specified as traumatic: Secondary | ICD-10-CM | POA: Diagnosis not present

## 2020-04-10 NOTE — Telephone Encounter (Signed)
   [facility] Pre-operative Risk Assessment    HEARTCARE STAFF: - Please ensure there is not already an duplicate clearance open for this procedure. - Under Visit Info/Reason for Call, type in Other and utilize the format Clearance MM/DD/YY or Clearance TBD. Do not use dashes or single digits. - If request is for dental extraction, please clarify the # of teeth to be extracted.  Request for surgical clearance:  1. What type of surgery is being performed? R shoulder arthroscopy, biceps tenodesis, mini open rotator cuff repair.  2. When is this surgery scheduled? TBD  3. What type of clearance is required (medical clearance vs. Pharmacy clearance to hold med vs. Both)? Both  4. Are there any medications that need to be held prior to surgery and how long? Brilinta/ASA  5. Practice name and name of physician performing surgery? [facility]  6. What is the office phone number? [phone]   7.   What is the office fax number? [phone]  8.   Anesthesia type (None, local, MAC, general) ? General    Meryl Crutch 04/10/2020, 5:48 PM  _________________________________________________________________   (provider comments below)

## 2020-04-12 NOTE — Telephone Encounter (Signed)
Patient was previously cleared by [name] PA-C in early December for the shoulder surgery, however surgery delayed until now.  For the past 2 weeks, he has been having intermittent left-sided chest discomfort that lasted a few minutes at a time.  This does not correlate with degree of exertion however the patient does admit he does not do any strenuous activity.  Talking with the patient, he is not sure if it is fluttering sensation versus muscle spasm.  I would recommend another visit with [facility] and obtain EKG to make sure no new changes. His recent myoview was reassuring.   Will forward to callback pool to arrange

## 2020-04-15 NOTE — Telephone Encounter (Signed)
Keith Newton you see him 04/23/20 for surgical clearance. Can you send your note to the surgeon? Tereso Newcomer, PA-C    04/15/2020 3:57 PM

## 2020-04-15 NOTE — Telephone Encounter (Signed)
I s/w the pt and advised him per pre op provider that he is going to need an appt as recent symptoms pt has been having, see notes from [place], [place]. Pt is extremely upset about needing an appt. I explained to the pt with the recent symptoms he has c/o that we will need to get an EKG and ov to be sure that he is ok to proceed with his surgery. Pt began raising his voice to me. I asked the pt to please stop yelling and that I am trying to help him so that he can proceed with surgery safely. Pt kept stating that if he didn't say anything would we had cleared him. I told him I am that would be up to the cardiologist. Pt also commented that we didn't care about his heart. I asked the pt to please not put down [facility]. I kept trying to reassure the pt that it is our job to make sure his heart is ok to proceed with surgery safely. Pt asked who gave [place] permission to review his chart for the surgery. I stated the surgeon's office is requesting cardiac clearance. Pt is finally agreeable to appt 04/23/20 @ 10:45 with [name], PAC. Pt states he would like to be also be put on a cancellation list. I assured the pt that I will do that as well and if we get something sooner we will call him.

## 2020-04-16 ENCOUNTER — Ambulatory Visit: Payer: 59 | Admitting: Orthopaedic Surgery

## 2020-04-17 ENCOUNTER — Other Ambulatory Visit: Payer: Self-pay | Admitting: Cardiovascular Disease

## 2020-04-17 ENCOUNTER — Encounter: Payer: Self-pay | Admitting: Orthopedic Surgery

## 2020-04-17 NOTE — Progress Notes (Signed)
Office Visit Note   Patient: Keith Newton           Date of Birth: 1955-12-29           MRN: [id number] Visit Date: 04/10/2020 Requested by: Sandre Kitty, MD [address] [place],  [place] [address] PCP: Sandre Kitty, MD  Subjective: Chief Complaint  Patient presents with  . Right Shoulder - Pain    HPI: Keith Newton is a 65 year old patient with right shoulder pain for a long time.  He is right-hand dominant.  He has good and bad days.  Usually it is painful and aching on a daily basis.  Takes Tylenol as needed.  He reports a grinding and pulling sensation in the arm.  His son and wife are at home.  This pain wakes him up at night.  Hard for him to lift the right arm.  He is retired.  Likes to work on cars and trucks and take care of his yard.              ROS: All systems reviewed are negative as they relate to the chief complaint within the history of present illness.  Patient denies  fevers or chills.   Assessment & Plan: Visit Diagnoses:  1. Complete tear of right rotator cuff, unspecified whether traumatic     Plan: Impression is right shoulder pain with full-thickness retracted tear of the supraspinatus tendon and the subscap tendon superiorly.  Medial subluxation of the biceps tendon with subtle SLAP tear present.  These are all significant pain generators in the shoulder.  MRI scan report is reviewed but I do not actually have access to the images.  Discussed with the patient options for treatment.  That would include arthroscopy with biceps tendon release debridement and mini open rotator cuff tear repair which I think would offer him a pretty good chance at pain relief and increased function.  The risk and benefits of that procedure are discussed with the patient including not limited to infection nerve vessel damage shoulder stiffness as well as the prolonged intensive rehabilitative effort required to regain a functional shoulder.  Patient understands risk benefits.  I  would like to use a passive shoulder range of motion device postoperatively.  Patient understands risk benefits and wishes to proceed.  He does have some medical comorbidities which will need to be evaluated in a risk stratification type manner prior to surgery.  All questions answered  Follow-Up Instructions: No follow-ups on file.   Orders:  No orders of the defined types were placed in this encounter.  No orders of the defined types were placed in this encounter.     Procedures: No procedures performed   Clinical Data: No additional findings.  Objective: Vital Signs: There were no vitals taken for this visit.  Physical Exam:   Constitutional: Patient appears well-developed HEENT:  Head: Normocephalic Eyes:EOM are normal Neck: Normal range of motion Cardiovascular: Normal rate Pulmonary/chest: Effort normal Neurologic: Patient is alert Skin: Skin is warm Psychiatric: Patient has normal mood and affect    Ortho Exam: Ortho exam demonstrates good cervical spine range of motion with intact motor sensory function to the hand.  Radial pulse was intact on the right-hand side.  Patient does have coarse grinding and crepitus with active and passive range of motion of that right arm above 90 degrees.  Has 5- out of 5 weakness to subscap testing on the right versus 5+ out of 5 on the left.  Passive  range of motion of the shoulder is above 90 degrees of forward flexion and abduction.  External rotation at 15 degrees of abduction on the right is to about 45 degrees bilaterally.  Supraspinatus weakness is present but infraspinatus weakness is not present on the right-hand side.  No discrete AC joint tenderness to direct palpation  Specialty Comments:  No specialty comments available.  Imaging: No results found.   PMFS History: Patient Active Problem List   Diagnosis Date Noted  . Gastrocnemius strain, left 03/23/2020  . Venous incompetence 12/28/2019  . Exertional dyspnea  11/08/2019  . Chronic right shoulder pain 09/02/2019  . Diabetic neuropathy (HCC) 08/16/2019  . Chest pain of uncertain etiology 04/27/2019  . CAD -S/P PCI 01/02/2019  . Obesity (BMI 30-39.9) 01/02/2019  . Orthostatic dizziness 01/02/2019  . Hyperlipidemia LDL goal <70 12/25/2018  . History of non-ST elevation myocardial infarction (NSTEMI) 12/23/2018  . Type 2 diabetes mellitus with complication (HCC)   . Tachycardia   . Hypertension    Past Medical History:  Diagnosis Date  . Arthritis   . Back pain   . CAD (coronary artery disease)    a. s/p NSTEMI in 12/2018 with 80% Prox-RCA stenosis followed by 100% prox to mid-RCA stenosis --> DESx2 to the RCA. Med management of residual disease.   . Carpal tunnel syndrome   . DDD (degenerative disc disease)   . Diabetes mellitus   . Hyperlipidemia   . Hypertension    states very mild  . Low testosterone   . Overweight   . Seasonal allergies   . Tachycardia     Family History  Problem Relation Age of Onset  . Stroke Mother   . CAD Father   . CAD Maternal Grandfather   . CAD Paternal Grandfather     Past Surgical History:  Procedure Laterality Date  . CARDIAC CATHETERIZATION    . CARPAL TUNNEL RELEASE  07/02/2011   Procedure: CARPAL TUNNEL RELEASE;  Surgeon: Wyn Forster., MD;  Location: [facility];  Service: Orthopedics;  Laterality: Right;  . CARPAL TUNNEL RELEASE  07/30/2011   Procedure: CARPAL TUNNEL RELEASE;  Surgeon: Wyn Forster., MD;  Location:  SURGERY CENTER;  Service: Orthopedics;  Laterality: Left;  . CORONARY ANGIOPLASTY    . CORONARY STENT INTERVENTION N/A 12/23/2018   Procedure: CORONARY STENT INTERVENTION;  Surgeon: Corky Crafts, MD;  Location: [facility] INVASIVE CV LAB;  Service: Cardiovascular;  Laterality: N/A;  . LEFT HEART CATH AND CORONARY ANGIOGRAPHY N/A 12/23/2018   Procedure: LEFT HEART CATH AND CORONARY ANGIOGRAPHY;  Surgeon: Corky Crafts, MD;  Location: [facility]  INVASIVE CV LAB;  Service: Cardiovascular;  Laterality: N/A;  . LUMBAR EPIDURAL INJECTION     x2   Social History   Occupational History  . Occupation: retired  Tobacco Use  . Smoking status: Never Smoker  . Smokeless tobacco: Never Used  Vaping Use  . Vaping Use: Never used  Substance and Sexual Activity  . Alcohol use: No  . Drug use: No  . Sexual activity: Not on file

## 2020-04-17 NOTE — Research (Signed)
V3 Late entry                                   "CONSENT"   YES     NO   Continuing further Investigational Product and study visits for follow-up? [x]  []   Continuing consent from future biomedical research [x]  []                                    "EVENTS"    YES     NO  AE   (IF YES SEE SOURCE) []  [x]   SAE  (IF YES SEE SOURCE) []  [x]   ENDPOINT   (IF YES SEE SOURCE) []  [x]   REVASCULARIZATION  (IF YES SEE SOURCE) []  [x]   AMPUTATION   (IF YES SEE SOURCE) []  [x]   TROPONIN'S  (IF YES SEE SOURCE) []  [x] 

## 2020-04-17 NOTE — Research (Signed)
AEGIS VISIT 11- End of study  Keith Newton called me back. He is doing well and has had no CP, AEs or SAEs since last visit. Medications are reviewed and he is taking them as directed. I thanked him for his participation in this study.   Lifestyle Adherence Assessment:    YES NO  Abstinence from smoking/remaining tobacco free X   Cardiac Diet X   Routine physical activity and/or cardiac rehabilitation X      Current Outpatient Medications:  .  acetaminophen (TYLENOL) 325 MG tablet, Take 650 mg by mouth every 6 (six) hours as needed., Disp: , Rfl:  .  aspirin EC 81 MG EC tablet, Take 1 tablet (81 mg total) by mouth daily., Disp:  , Rfl:  .  atorvastatin (LIPITOR) 80 MG tablet, TAKE 1 TABLET BY MOUTH EVERY DAY, Disp: 90 tablet, Rfl: 3 .  Baclofen 5 MG TABS, Take 5 mg by mouth at bedtime as needed., Disp: 30 tablet, Rfl: 1 .  Blood Glucose Monitoring Suppl (ONETOUCH VERIO IQ SYSTEM) w/Device KIT, Check fasting blood glucose in AM and before giving insulin., Disp: 1 kit, Rfl: 1 .  BRILINTA 90 MG TABS tablet, TAKE 1 TABLET BY MOUTH TWO TIMES DAILY, Disp: 180 tablet, Rfl: 3 .  dapagliflozin propanediol (FARXIGA) 10 MG TABS tablet, Take 1 tablet (10 mg total) by mouth daily., Disp: 90 tablet, Rfl: 3 .  Dulaglutide (TRULICITY) 0.71 QR/9.7JO SOPN, Inject 0.75 mg into the skin once a week., Disp: 2 mL, Rfl: 1 .  Elastic Bandages & Supports (MEDICAL COMPRESSION STOCKINGS) MISC, Wear daily, Disp: 2 each, Rfl: 1 .  glucose blood (ONETOUCH VERIO) test strip, Use as instructed, Disp: 100 each, Rfl: 12 .  HYDROcodone-acetaminophen (NORCO) 5-325 MG tablet, Take 1 tablet by mouth 3 (three) times daily as needed., Disp: 30 tablet, Rfl: 0 .  insulin degludec (TRESIBA FLEXTOUCH) 100 UNIT/ML SOPN FlexTouch Pen, Inject 40 Units into the skin daily., Disp: , Rfl:  .  Insulin Pen Needle (NOVOFINE PLUS) 32G X 4 MM MISC, Inject into the skin daily., Disp: , Rfl:  .  isosorbide mononitrate (IMDUR) 60 MG 24 hr tablet,  Take 1 tablet (60 mg total) by mouth daily., Disp: 90 tablet, Rfl: 3 .  Lancet Devices (ONE TOUCH DELICA LANCING DEV) MISC, Check fasting blood glucose in AM and before giving insulin., Disp: 1 each, Rfl: 11 .  metFORMIN (GLUCOPHAGE) 1000 MG tablet, TAKE 1 TABLET BY MOUTH TWICE A DAY, Disp: 180 tablet, Rfl: 1 .  methocarbamol (ROBAXIN) 500 MG tablet, Take 1 tablet (500 mg total) by mouth 2 (two) times daily as needed., Disp: 20 tablet, Rfl: 0 .  metoprolol tartrate (LOPRESSOR) 50 MG tablet, Take 1 tablet (50 mg total) by mouth 2 (two) times daily., Disp: 180 tablet, Rfl: 3 .  nitroGLYCERIN (NITROSTAT) 0.4 MG SL tablet, Place 1 tablet (0.4 mg total) under the tongue every 5 (five) minutes x 3 doses as needed for chest pain., Disp: 25 tablet, Rfl: 3 .  OneTouch Delica Lancets 83G MISC, Check fasting blood glucose in AM and before giving insulin., Disp: 100 each, Rfl: 11 .  pregabalin (LYRICA) 75 MG capsule, Take 1 capsule (75 mg total) by mouth 3 (three) times daily., Disp: 90 capsule, Rfl: 1 .  traMADol (ULTRAM) 50 MG tablet, Take 1 tablet (50 mg total) by mouth 3 (three) times daily as needed., Disp: 30 tablet, Rfl: 0 .  traMADol (ULTRAM) 50 MG tablet, Take 1-2 tablets (50-100 mg total) by  mouth daily as needed., Disp: 20 tablet, Rfl: 0

## 2020-04-19 ENCOUNTER — Encounter: Payer: Self-pay | Admitting: Physical Therapy

## 2020-04-19 ENCOUNTER — Ambulatory Visit: Payer: 59 | Attending: Family Medicine | Admitting: Physical Therapy

## 2020-04-19 ENCOUNTER — Other Ambulatory Visit: Payer: Self-pay

## 2020-04-19 DIAGNOSIS — S86112A Strain of other muscle(s) and tendon(s) of posterior muscle group at lower leg level, left leg, initial encounter: Secondary | ICD-10-CM | POA: Diagnosis present

## 2020-04-19 DIAGNOSIS — M25672 Stiffness of left ankle, not elsewhere classified: Secondary | ICD-10-CM | POA: Diagnosis present

## 2020-04-19 DIAGNOSIS — M6281 Muscle weakness (generalized): Secondary | ICD-10-CM | POA: Diagnosis present

## 2020-04-19 DIAGNOSIS — R262 Difficulty in walking, not elsewhere classified: Secondary | ICD-10-CM | POA: Diagnosis present

## 2020-04-19 DIAGNOSIS — R2689 Other abnormalities of gait and mobility: Secondary | ICD-10-CM | POA: Diagnosis present

## 2020-04-19 DIAGNOSIS — R252 Cramp and spasm: Secondary | ICD-10-CM | POA: Insufficient documentation

## 2020-04-19 DIAGNOSIS — M79662 Pain in left lower leg: Secondary | ICD-10-CM | POA: Diagnosis present

## 2020-04-19 NOTE — Therapy (Signed)
[facility] [address] [place], [place], [address] Phone: [phone]   Fax:  [phone]  Physical Therapy Treatment  Patient Details  Name: Keith Newton MRN: [id number] Date of Birth: 18-Aug-1955 Referring Provider (PT): Cristie Hem, [place]   Encounter Date: 04/19/2020   PT End of Session - 04/19/20 1110    Visit Number 3    Number of Visits 9    Date for PT Re-Evaluation 05/11/20    Authorization Type [employer]    PT Start Time 1105    PT Stop Time 1144    PT Time Calculation (min) 39 min    Activity Tolerance Patient tolerated treatment well    Behavior During Therapy [facility] for tasks assessed/performed           Past Medical History:  Diagnosis Date  . Arthritis   . Back pain   . CAD (coronary artery disease)    a. s/p NSTEMI in 12/2018 with 80% Prox-RCA stenosis followed by 100% prox to mid-RCA stenosis --> DESx2 to the RCA. Med management of residual disease.   . Carpal tunnel syndrome   . DDD (degenerative disc disease)   . Diabetes mellitus   . Hyperlipidemia   . Hypertension    states very mild  . Low testosterone   . Overweight   . Seasonal allergies   . Tachycardia     Past Surgical History:  Procedure Laterality Date  . CARDIAC CATHETERIZATION    . CARPAL TUNNEL RELEASE  07/02/2011   Procedure: CARPAL TUNNEL RELEASE;  Surgeon: Wyn Forster., MD;  Location: [facility];  Service: Orthopedics;  Laterality: Right;  . CARPAL TUNNEL RELEASE  07/30/2011   Procedure: CARPAL TUNNEL RELEASE;  Surgeon: Wyn Forster., MD;  Location: [facility];  Service: Orthopedics;  Laterality: Left;  . CORONARY ANGIOPLASTY    . CORONARY STENT INTERVENTION N/A 12/23/2018   Procedure: CORONARY STENT INTERVENTION;  Surgeon: Corky Crafts, MD;  Location[facility] INVASIVE CV LAB;  Service: Cardiovascular;  Laterality: N/A;  . LEFT HEART CATH AND CORONARY ANGIOGRAPHY N/A 12/23/2018    Procedure: LEFT HEART CATH AND CORONARY ANGIOGRAPHY;  Surgeon: Corky Crafts, MD;  Location: [facility] INVASIVE CV LAB;  Service: Cardiovascular;  Laterality: N/A;  . LUMBAR EPIDURAL INJECTION     x2    There were no vitals filed for this visit.   Subjective Assessment - 04/19/20 1108    Subjective " I am doing better, I am still getting pain in L calf more with walking/ standing. "    Currently in Pain? Yes    Pain Score 6     Pain Orientation Left    Pain Descriptors / Indicators Aching;Throbbing;Sore    Pain Type Chronic pain    Pain Onset More than a month ago    Pain Frequency Intermittent    Aggravating Factors  standing/ walking,    Pain Relieving Factors heat/ cold              [facility] PT Assessment - 04/19/20 0001      Assessment   Medical Diagnosis Gastrocnemius strain, left, initial encounter    Referring Provider (PT) Ivin Poot                         [facility] Adult PT Treatment/Exercise - 04/19/20 0001      Manual Therapy   Manual therapy comments MTPR along the  gastron/ solues    Soft tissue mobilization STW along      Ankle Exercises: Seated   Heel Raises Both;20 reps    Toe Raise 20 reps      Ankle Exercises: Aerobic   Nustep L5 x 5 min LE only      Ankle Exercises: Standing   Other Standing Ankle Exercises gait in // forward/ backward   focusing on heel strike/ toe off and frefers with walking. cues to keep feet from crossing     Ankle Exercises: Stretches   Gastroc Stretch 2 reps;30 seconds   seated   Gastroc Stretch Limitations 2 x 30 foot against the wall.                  PT Education - 04/19/20 1235    Education Details Reviewed sleeping positioning and effect of muslce tension on the calf.  discussed night splint to help keep foot DF througout the night and benefits.    Person(s) Educated Patient    Methods Explanation;Verbal cues;Handout    Comprehension Verbalized understanding;Verbal cues required             PT Short Term Goals - 04/07/20 2247      PT SHORT TERM GOAL #1   Title He will be indppendent with intial HEP    Baseline Started on eval    Status New    Target Date 04/28/20      PT SHORT TERM GOAL #2   Title Pt will voice undertsanding of messure to assist in the reduction of pain and swelling    Status New    Target Date 04/28/20             PT Long Term Goals - 04/07/20 2250      PT LONG TERM GOAL #1   Title Pt will be Ind in a final HEP to maintain or progress achieved LOF    Status New    Target Date 05/11/20      PT LONG TERM GOAL #2   Title Increase L ankle DF AROM to 10d for iproved function and gait of the L ankle/UE    Baseline 5d    Status New    Target Date 05/11/20      PT LONG TERM GOAL #3   Title Increased L ankle PF strength to 4+/5 strength for improved function of the L ankle/LE    Status New    Target Date 05/11/20      PT LONG TERM GOAL #4   Title Pt will report a decrease in L calf pain to 3/10 or less with daily activities    Status New    Target Date 05/11/20      PT LONG TERM GOAL #5   Title Pt will walk with a normalized gait pattern with equal step length and heel to toe pattern s an antalgic limp    Status New    Target Date 05/11/20      Additional Long Term Goals   Additional Long Term Goals Yes      PT LONG TERM GOAL #6   Title PT's FOTO score will improve to predicted value of 71% functional ability    Baseline 49%    Status New    Target Date 05/11/20                 Plan - 04/19/20 1236    Clinical Impression Statement Mr Reinig reports he feels he is walking better  and the pain isn't as severe as before starting PT. He does continue to report elevated pain inthe calf especially when getting out of bed in the AM. reviewed sleeping posture and effect of muscle shortening for long periods of time, and potential for a night splint. continued STW along the gastroc/ soleus and stretching / muscle activation.  practiced gait training in // forward/ retro. end of session he noted feeling reduced stiffness but conitnued soreness in the calf.    PT Treatment/Interventions ADLs/Self Care Home Management;Cryotherapy;Electrical Stimulation;Ultrasound;Moist Heat;Iontophoresis 4mg /ml Dexamethasone;Gait training;Stair training;Functional mobility training;Therapeutic activities;Therapeutic exercise;Balance training;Patient/family education;Manual techniques;Dry needling;Passive range of motion;Taping;Vasopneumatic Device    PT Next Visit Plan Assess response to HEP and progress PRN, use manuel techniques and modalities as indicated, progress calf strength and gait training    PT Home Exercise Plan [id number] - added light calf strengthening    Consulted and Agree with Plan of Care Patient           Patient will benefit from skilled therapeutic intervention in order to improve the following deficits and impairments:  Abnormal gait,Decreased range of motion,Difficulty walking,Obesity,Decreased activity tolerance,Pain,Decreased strength,Decreased balance  Visit Diagnosis: Decreased range of motion of left ankle  Difficulty in walking, not elsewhere classified  Muscle weakness (generalized)  Pain of left calf     Problem List Patient Active Problem List   Diagnosis Date Noted  . Gastrocnemius strain, left 03/23/2020  . Venous incompetence 12/28/2019  . Exertional dyspnea 11/08/2019  . Chronic right shoulder pain 09/02/2019  . Diabetic neuropathy (HCC) 08/16/2019  . Chest pain of uncertain etiology 04/27/2019  . CAD -S/P PCI 01/02/2019  . Obesity (BMI 30-39.9) 01/02/2019  . Orthostatic dizziness 01/02/2019  . Hyperlipidemia LDL goal <70 12/25/2018  . History of non-ST elevation myocardial infarction (NSTEMI) 12/23/2018  . Type 2 diabetes mellitus with complication (HCC)   . Tachycardia   . Hypertension    13/07/2018 PT, DPT, LAT, ATC  04/19/20  12:40 PM      [facility] [address] [place], [place], [place] Phone: [phone]   Fax:  [phone]  Name: Ulrick Methot MRN: [name] Date of Birth: 07-19-1955

## 2020-04-22 ENCOUNTER — Encounter: Payer: 59 | Admitting: Physical Therapy

## 2020-04-23 ENCOUNTER — Other Ambulatory Visit: Payer: Self-pay

## 2020-04-23 ENCOUNTER — Encounter: Payer: Self-pay | Admitting: Cardiology

## 2020-04-23 ENCOUNTER — Encounter: Payer: Self-pay | Admitting: Cardiovascular Disease

## 2020-04-23 ENCOUNTER — Ambulatory Visit (INDEPENDENT_AMBULATORY_CARE_PROVIDER_SITE_OTHER): Payer: 59 | Admitting: Cardiology

## 2020-04-23 VITALS — BP 132/91 | HR 88 | Ht 68.0 in | Wt 250.0 lb

## 2020-04-23 DIAGNOSIS — I2510 Atherosclerotic heart disease of native coronary artery without angina pectoris: Secondary | ICD-10-CM

## 2020-04-23 DIAGNOSIS — I252 Old myocardial infarction: Secondary | ICD-10-CM

## 2020-04-23 DIAGNOSIS — Z01818 Encounter for other preprocedural examination: Secondary | ICD-10-CM | POA: Diagnosis not present

## 2020-04-23 DIAGNOSIS — R079 Chest pain, unspecified: Secondary | ICD-10-CM

## 2020-04-23 DIAGNOSIS — Z9861 Coronary angioplasty status: Secondary | ICD-10-CM

## 2020-04-23 DIAGNOSIS — E118 Type 2 diabetes mellitus with unspecified complications: Secondary | ICD-10-CM

## 2020-04-23 DIAGNOSIS — E785 Hyperlipidemia, unspecified: Secondary | ICD-10-CM

## 2020-04-23 NOTE — Assessment & Plan Note (Signed)
S/P RCA PCI with DES x 2 12/23/2018 Residual 40% LAD, 50% CFX- EF 55%

## 2020-04-23 NOTE — Assessment & Plan Note (Signed)
Pt seen today for pre op shoulder surgery clearance- he has had chest tightness similar to his pre MI symptoms since his LOV. He is unable to exercise secondary to diabetic neuropathy.  Will check Lexiscan Myoview for pre op clearance.

## 2020-04-23 NOTE — Assessment & Plan Note (Signed)
Diabetes with neuropathy.  °

## 2020-04-23 NOTE — Patient Instructions (Signed)
Medication Instructions:  Continue current medications  *If you need a refill on your cardiac medications before your next appointment, please call your pharmacy*   Lab Work: None Ordered   Testing/Procedures: Your physician has requested that you have a lexiscan myoview. For further information please visit [email or web address] Please follow instruction sheet, as given.   Follow-Up: At [facility], you and your health needs are our priority.  As part of our continuing mission to provide you with exceptional heart care, we have created designated Provider Care Teams.  These Care Teams include your primary Cardiologist (physician) and Advanced Practice Providers (APPs -  Physician Assistants and Nurse Practitioners) who all work together to provide you with the care you need, when you need it.  We recommend signing up for the patient portal called "MyChart".  Sign up information is provided on this After Visit Summary.  MyChart is used to connect with patients for Virtual Visits (Telemedicine).  Patients are able to view lab/test results, encounter notes, upcoming appointments, etc.  Non-urgent messages can be sent to your provider as well.   To learn more about what you can do with MyChart, go to [email or web address].    Your next appointment:   June 2022  The format for your next appointment:   In Person  Provider:   Nicki Guadalajara, MD

## 2020-04-23 NOTE — Progress Notes (Signed)
Cardiology Office Note:    Date:  04/23/2020   ID:  Keith Newton, DOB 1955/08/04, MRN [id number]  PCP:  Benay Pike, MD  Cardiologist:  Shelva Majestic, MD  Electrophysiologist:  None   Referring MD: Benay Pike, MD   Chest "tightness"  History of Present Illness:    Keith Newton is a 65 y.o. male with a hx of hypertension, non-insulin-dependent diabetes with neuropathy, and a past evaluation for sinus tachycardia.The patient was admitted 12/23/2018 with nausea vomiting chest "tightness" and shortness of breath. He had symptoms all night and his daughter urged him to go to the ED the next day. He was markedly hypertensive in the emergency room with a blood pressure of 180/114. His troponin was greater than 27,000. He was taken to the Cath Lab where he had an intervention to 2 sites in the RCA. He tolerated this well. He does have residual 40%pLAD and 50%mCFX, and 75% PDA. Ejection fraction was 55-60% with basilar hypokinesis by echo. He was discharged 12/25/2018. In Oct 2021 he had a Myoview secondary to chest discomfort, this was low risk.  He was seen in December 2021 and at that time he was cleared for shoulder surgery.  The surgery got put off.  He was contacted recently again by phone for surgical clearance and mentioned to the provider that he had intermittent chest "tightness" and palpitations.  He is seen in the office today for further evaluation. He is unable to exercise secondary to neuropathy so I really can't get an idea of his exercise capacity.  He only walks to the mailbox and back.  His chest "tightness" sounds similar to his pre MI symptoms.   And Past Medical History:  Diagnosis Date  . Arthritis   . Back pain   . CAD (coronary artery disease)    a. s/p NSTEMI in 12/2018 with 80% Prox-RCA stenosis followed by 100% prox to mid-RCA stenosis --> DESx2 to the RCA. Med management of residual disease.   . Carpal tunnel syndrome   . DDD (degenerative disc disease)    . Diabetes mellitus   . Hyperlipidemia   . Hypertension    states very mild  . Low testosterone   . Overweight   . Seasonal allergies   . Tachycardia     Past Surgical History:  Procedure Laterality Date  . CARDIAC CATHETERIZATION    . CARPAL TUNNEL RELEASE  07/02/2011   Procedure: CARPAL TUNNEL RELEASE;  Surgeon: Cammie Sickle., MD;  Location: [facility];  Service: Orthopedics;  Laterality: Right;  . CARPAL TUNNEL RELEASE  07/30/2011   Procedure: CARPAL TUNNEL RELEASE;  Surgeon: Cammie Sickle., MD;  Location: [facility];  Service: Orthopedics;  Laterality: Left;  . CORONARY ANGIOPLASTY    . CORONARY STENT INTERVENTION N/A 12/23/2018   Procedure: CORONARY STENT INTERVENTION;  Surgeon: Jettie Booze, MD;  Location: [facility];  Service: Cardiovascular;  Laterality: N/A;  . LEFT HEART CATH AND CORONARY ANGIOGRAPHY N/A 12/23/2018   Procedure: LEFT HEART CATH AND CORONARY ANGIOGRAPHY;  Surgeon: Jettie Booze, MD;  Location: [facility];  Service: Cardiovascular;  Laterality: N/A;  . LUMBAR EPIDURAL INJECTION     x2    Current Medications: Current Meds  Medication Sig  . acetaminophen (TYLENOL) 325 MG tablet Take 650 mg by mouth every 6 (six) hours as needed.  Marland Kitchen aspirin EC 81 MG EC tablet Take 1 tablet (81 mg total) by mouth daily.  Marland Kitchen  atorvastatin (LIPITOR) 80 MG tablet TAKE 1 TABLET BY MOUTH EVERY DAY  . Baclofen 5 MG TABS Take 5 mg by mouth at bedtime as needed.  . Blood Glucose Monitoring Suppl (ONETOUCH VERIO IQ SYSTEM) w/Device KIT Check fasting blood glucose in AM and before giving insulin.  Marland Kitchen BRILINTA 90 MG TABS tablet TAKE 1 TABLET BY MOUTH TWO TIMES DAILY  . dapagliflozin propanediol (FARXIGA) 10 MG TABS tablet Take 1 tablet (10 mg total) by mouth daily.  . Dulaglutide (TRULICITY) 1.69 CV/8.9FY SOPN Inject 0.75 mg into the skin once a week.  Regino Schultze Bandages & Supports (MEDICAL COMPRESSION STOCKINGS) MISC  Wear daily  . glucose blood (ONETOUCH VERIO) test strip Use as instructed  . HYDROcodone-acetaminophen (NORCO) 5-325 MG tablet Take 1 tablet by mouth 3 (three) times daily as needed.  . insulin degludec (TRESIBA FLEXTOUCH) 100 UNIT/ML SOPN FlexTouch Pen Inject 40 Units into the skin daily.  . Insulin Pen Needle 32G X 4 MM MISC Inject into the skin daily.  Elmore Guise Devices (ONE TOUCH DELICA LANCING DEV) MISC Check fasting blood glucose in AM and before giving insulin.  . metFORMIN (GLUCOPHAGE) 1000 MG tablet TAKE 1 TABLET BY MOUTH TWICE A DAY  . methocarbamol (ROBAXIN) 500 MG tablet Take 1 tablet (500 mg total) by mouth 2 (two) times daily as needed.  . metoprolol tartrate (LOPRESSOR) 50 MG tablet TAKE 1 TABLET BY MOUTH TWICE A DAY  . nitroGLYCERIN (NITROSTAT) 0.4 MG SL tablet Place 1 tablet (0.4 mg total) under the tongue every 5 (five) minutes x 3 doses as needed for chest pain.  Glory Rosebush Delica Lancets 10F MISC Check fasting blood glucose in AM and before giving insulin.  . pregabalin (LYRICA) 75 MG capsule Take 1 capsule (75 mg total) by mouth 3 (three) times daily.  . traMADol (ULTRAM) 50 MG tablet Take 1 tablet (50 mg total) by mouth 3 (three) times daily as needed.  . traMADol (ULTRAM) 50 MG tablet Take 1-2 tablets (50-100 mg total) by mouth daily as needed.     Allergies:   Codeine   Social History   Socioeconomic History  . Marital status: Married    Spouse name: Yutaka Holberg  . Number of children: 2  . Years of education: 16  . Highest education level: Bachelor's degree (e.g., BA, AB, BS)  Occupational History  . Occupation: retired  Tobacco Use  . Smoking status: Never Smoker  . Smokeless tobacco: Never Used  Vaping Use  . Vaping Use: Never used  Substance and Sexual Activity  . Alcohol use: No  . Drug use: No  . Sexual activity: Not on file  Other Topics Concern  . Not on file  Social History Narrative  . Not on file   Social Determinants of Health   Financial  Resource Strain: Not on file  Food Insecurity: Not on file  Transportation Needs: Not on file  Physical Activity: Not on file  Stress: Not on file  Social Connections: Not on file     Family History: The patient's family history includes CAD in his father, maternal grandfather, and paternal grandfather; Stroke in his mother.  ROS:   Please see the history of present illness.    All other systems reviewed and are negative.  EKGs/Labs/Other Studies Reviewed:    The following studies were reviewed today: Myoview- 11/17/2019-  The left ventricular ejection fraction is mildly decreased (45-54%).  Nuclear stress EF: 53%.  There was no ST segment deviation noted during stress.  The study is normal.  This is a low risk study.    EKG:  EKG is ordered today.  The ekg ordered today demonstrates NSR, HR 88  Recent Labs: 01/31/2020: BUN 16; Creatinine, Ser 0.96; Potassium 4.6; Sodium 139  Recent Lipid Panel    Component Value Date/Time   CHOL 110 03/01/2019 1051   TRIG 110 03/01/2019 1051   HDL 35 (L) 03/01/2019 1051   CHOLHDL 3.1 03/01/2019 1051   CHOLHDL 6.6 12/24/2018 0214   VLDL 25 12/24/2018 0214   LDLCALC 55 03/01/2019 1051    Physical Exam:    VS:  BP (!) 132/91   Pulse 88   Ht 5' 8"  (1.727 m)   Wt 250 lb (113.4 kg)   SpO2 98%   BMI 38.01 kg/m     Wt Readings from Last 3 Encounters:  04/23/20 250 lb (113.4 kg)  04/01/20 250 lb 9.6 oz (113.7 kg)  03/21/20 252 lb 3.2 oz (114.4 kg)     GEN:  Obese AA male, , well developed in no acute distress HEENT: Normal NECK: No JVD; No carotid bruits CARDIAC: RRR, no murmurs, rubs, gallops RESPIRATORY:  Clear to auscultation without rales, wheezing or rhonchi  ABDOMEN: Soft, non-tender, non-distended MUSCULOSKELETAL:  No edema; No deformity  SKIN: Warm and dry NEUROLOGIC:  Alert and oriented x 3 PSYCHIATRIC:  Normal affect   ASSESSMENT:    Pre-operative clearance Pt seen today for pre op shoulder surgery  clearance- he has had chest tightness similar to his pre MI symptoms since his LOV. He is unable to exercise secondary to diabetic neuropathy.  Will check Lexiscan Myoview for pre op clearance.   CAD -S/P PCI S/P RCA PCI with DES x 2 12/23/2018 Residual 40% LAD, 50% CFX- EF 55%  History of non-ST elevation myocardial infarction (NSTEMI) S/P inferior NSTEMI 12/23/2018  Type 2 diabetes mellitus with complication ([place]) Diabetes with neuropathy  PLAN:    Lexiscan Myoview prior to granting Pre op clearance.   Medication Adjustments/Labs and Tests Ordered: Current medicines are reviewed at length with the patient today.  Concerns regarding medicines are outlined above.  Orders Placed This Encounter  Procedures  . MYOCARDIAL PERFUSION IMAGING  . EKG 12-Lead   No orders of the defined types were placed in this encounter.   Patient Instructions  Medication Instructions:  Continue current medications  *If you need a refill on your cardiac medications before your next appointment, please call your pharmacy*   Lab Work: None Ordered   Testing/Procedures: Your physician has requested that you have a lexiscan myoview. For further information please visit [email or web address]. Please follow instruction sheet, as given.   Follow-Up: At [facility], you and your health needs are our priority.  As part of our continuing mission to provide you with exceptional heart care, we have created designated Provider Care Teams.  These Care Teams include your primary Cardiologist (physician) and Advanced Practice Providers (APPs -  Physician Assistants and Nurse Practitioners) who all work together to provide you with the care you need, when you need it.  We recommend signing up for the patient portal called "MyChart".  Sign up information is provided on this After Visit Summary.  MyChart is used to connect with patients for Virtual Visits (Telemedicine).  Patients are able to view lab/test  results, encounter notes, upcoming appointments, etc.  Non-urgent messages can be sent to your provider as well.   To learn more about what you can do with MyChart, go to [email or web address].  Your next appointment:   June 2022  The format for your next appointment:   In Person  Provider:   Shelva Majestic, MD        Signed, Kerin Ransom, PA-C  04/23/2020 11:30 AM    [place]

## 2020-04-23 NOTE — Assessment & Plan Note (Signed)
S/P inferior NSTEMI 12/23/2018

## 2020-04-24 ENCOUNTER — Telehealth (HOSPITAL_COMMUNITY): Payer: Self-pay | Admitting: *Deleted

## 2020-04-24 NOTE — Telephone Encounter (Signed)
Close encounter 

## 2020-04-25 ENCOUNTER — Ambulatory Visit: Payer: 59

## 2020-04-26 ENCOUNTER — Ambulatory Visit (HOSPITAL_COMMUNITY)
Admission: RE | Admit: 2020-04-26 | Discharge: 2020-04-26 | Disposition: A | Discharge status: Home / Self Care | Payer: 59 | Source: Ambulatory Visit | Attending: Cardiology | Admitting: Cardiology

## 2020-04-26 ENCOUNTER — Other Ambulatory Visit: Payer: Self-pay

## 2020-04-26 DIAGNOSIS — R079 Chest pain, unspecified: Secondary | ICD-10-CM | POA: Diagnosis not present

## 2020-04-26 DIAGNOSIS — Z9861 Coronary angioplasty status: Secondary | ICD-10-CM | POA: Insufficient documentation

## 2020-04-26 DIAGNOSIS — I2510 Atherosclerotic heart disease of native coronary artery without angina pectoris: Secondary | ICD-10-CM

## 2020-04-26 LAB — MYOCARDIAL PERFUSION IMAGING
LV dias vol: 69 mL (ref 62–150)
LV sys vol: 40 mL
Peak HR: 125 {beats}/min
Rest HR: 100 {beats}/min
SDS: 4
SRS: 0
SSS: 4
TID: 1.41

## 2020-04-26 MED ORDER — TECHNETIUM TC 99M TETROFOSMIN IV KIT
31.7000 | PACK | Freq: Once | INTRAVENOUS | Status: AC | PRN
  Administered 2020-04-26: 31.7 via INTRAVENOUS
  Filled 2020-04-26: qty 32

## 2020-04-26 MED ORDER — TECHNETIUM TC 99M TETROFOSMIN IV KIT
10.6000 | PACK | Freq: Once | INTRAVENOUS | Status: AC | PRN
  Administered 2020-04-26: 10.6 via INTRAVENOUS
  Filled 2020-04-26: qty 11

## 2020-04-26 MED ORDER — REGADENOSON 0.4 MG/5ML IV SOLN
0.4000 mg | Freq: Once | INTRAVENOUS | Status: AC
  Administered 2020-04-26: 0.4 mg via INTRAVENOUS

## 2020-04-26 MED ORDER — PREGABALIN 75 MG PO CAPS: 75.0000 mg | ORAL_CAPSULE | Freq: Three times a day (TID) | ORAL | 1 refills | Status: DC

## 2020-04-30 ENCOUNTER — Other Ambulatory Visit: Payer: Self-pay

## 2020-04-30 ENCOUNTER — Ambulatory Visit: Payer: 59

## 2020-04-30 DIAGNOSIS — M25672 Stiffness of left ankle, not elsewhere classified: Secondary | ICD-10-CM

## 2020-04-30 DIAGNOSIS — M6281 Muscle weakness (generalized): Secondary | ICD-10-CM

## 2020-04-30 DIAGNOSIS — S86112A Strain of other muscle(s) and tendon(s) of posterior muscle group at lower leg level, left leg, initial encounter: Secondary | ICD-10-CM

## 2020-04-30 DIAGNOSIS — M79662 Pain in left lower leg: Secondary | ICD-10-CM

## 2020-04-30 DIAGNOSIS — R262 Difficulty in walking, not elsewhere classified: Secondary | ICD-10-CM

## 2020-04-30 DIAGNOSIS — R2689 Other abnormalities of gait and mobility: Secondary | ICD-10-CM

## 2020-04-30 NOTE — Therapy (Signed)
[facility] [address] [place], [place], [address] Phone: [phone]   Fax:  [phone]  Physical Therapy Treatment  Patient Details  Name: Keith Newton MRN: [id number] Date of Birth: 1955-02-18 Referring Provider (PT): Cristie Hem, [place]   Encounter Date: 04/30/2020   PT End of Session - 04/30/20 1204    Visit Number 4    Number of Visits 9    Date for PT Re-Evaluation 05/11/20    Authorization Type [employer]    PT Start Time 1136    PT Stop Time 1220    PT Time Calculation (min) 44 min    Activity Tolerance Patient tolerated treatment well    Behavior During Therapy [facility] for tasks assessed/performed           Past Medical History:  Diagnosis Date  . Arthritis   . Back pain   . CAD (coronary artery disease)    a. s/p NSTEMI in 12/2018 with 80% Prox-RCA stenosis followed by 100% prox to mid-RCA stenosis --> DESx2 to the RCA. Med management of residual disease.   . Carpal tunnel syndrome   . DDD (degenerative disc disease)   . Diabetes mellitus   . Hyperlipidemia   . Hypertension    states very mild  . Low testosterone   . Overweight   . Seasonal allergies   . Tachycardia     Past Surgical History:  Procedure Laterality Date  . CARDIAC CATHETERIZATION    . CARPAL TUNNEL RELEASE  07/02/2011   Procedure: CARPAL TUNNEL RELEASE;  Surgeon: Wyn Forster., MD;  Location: [facility];  Service: Orthopedics;  Laterality: Right;  . CARPAL TUNNEL RELEASE  07/30/2011   Procedure: CARPAL TUNNEL RELEASE;  Surgeon: Wyn Forster., MD;  Location: [facility];  Service: Orthopedics;  Laterality: Left;  . CORONARY ANGIOPLASTY    . CORONARY STENT INTERVENTION N/A 12/23/2018   Procedure: CORONARY STENT INTERVENTION;  Surgeon: Corky Crafts, MD;  Location: [facility];  Service: Cardiovascular;  Laterality: N/A;  . LEFT HEART CATH AND CORONARY ANGIOGRAPHY N/A 12/23/2018    Procedure: LEFT HEART CATH AND CORONARY ANGIOGRAPHY;  Surgeon: Corky Crafts, MD;  Location: [facility] INVASIVE [facility] LAB;  Service: Cardiovascular;  Laterality: N/A;  . LUMBAR EPIDURAL INJECTION     x2    There were no vitals filed for this visit.   Subjective Assessment - 04/30/20 1142    Subjective Pt continues to report his L calf pain is improving. Pt reports he is using rest, heat, elevation and tylenol to manage his L calf pain.    Limitations Standing;Walking    Patient Stated Goals For the pain to improve so he can walk comfortably.    Currently in Pain? Yes    Pain Score 5     Pain Location Calf    Pain Orientation Left    Pain Descriptors / Indicators Sore    Pain Type Chronic pain    Pain Onset More than a month ago    Pain Frequency Intermittent    Aggravating Factors  standing/ walking,    Pain Relieving Factors heat/ cold    Effect of Pain on Daily Activities Limits mobility                             [facility] Adult PT Treatment/Exercise - 04/30/20 0001      Ambulation/Gait   Gait  Pattern Step-through pattern   heel/toe pattern at a decreased pace     Exercises   Exercises Ankle      Manual Therapy   Manual Therapy Soft tissue mobilization    Manual therapy comments STM with amnual massage and IASTM c roller      Ankle Exercises: Stretches   Other Stretch Ankle stretch into DF c strap, 3x, 30 sec      Ankle Exercises: Aerobic   Nustep L5 x 5 min LE only      Ankle Exercises: Seated   Heel Raises Both;20 reps    Toe Raise 20 reps    Other Seated Ankle Exercises Banded L ankle exs, 4 wat, red TBand                  PT Education - 04/30/20 1401    Education Details HEP- ankle strengthening exs    Person(s) Educated Patient    Methods Explanation;Demonstration;Tactile cues;Verbal cues;Handout    Comprehension Verbalized understanding;Returned demonstration;Verbal cues required;Tactile cues required            PT Short Term  Goals - 04/30/20 1404      PT SHORT TERM GOAL #1   Title He will be indppendent with intial HEP    Status Achieved    Target Date 04/28/20      PT SHORT TERM GOAL #2   Title Pt will voice undertsanding of messure to assist in the reduction of pain and swelling    Status Achieved    Target Date 04/28/20             PT Long Term Goals - 04/07/20 2250      PT LONG TERM GOAL #1   Title Pt will be Ind in a final HEP to maintain or progress achieved LOF    Status New    Target Date 05/11/20      PT LONG TERM GOAL #2   Title Increase L ankle DF AROM to 10d for iproved function and gait of the L ankle/UE    Baseline 5d    Status New    Target Date 05/11/20      PT LONG TERM GOAL #3   Title Increased L ankle PF strength to 4+/5 strength for improved function of the L ankle/LE    Status New    Target Date 05/11/20      PT LONG TERM GOAL #4   Title Pt will report a decrease in L calf pain to 3/10 or less with daily activities    Status New    Target Date 05/11/20      PT LONG TERM GOAL #5   Title Pt will walk with a normalized gait pattern with equal step length and heel to toe pattern s an antalgic limp    Status New    Target Date 05/11/20      Additional Long Term Goals   Additional Long Term Goals Yes      PT LONG TERM GOAL #6   Title PT's FOTO score will improve to predicted value of 71% functional ability    Baseline 49%    Status New    Target Date 05/11/20                 Plan - 04/30/20 1402    Clinical Impression Statement PT was completed to address L ankle DF ROM and ankle strength. STM was completed to address calf tightness. Pt reported tenderness of the medial  L calf. Strengthening for the L ankle was completed for all motions. Pt is walking without an antalgic pattern over the L LE. Pt walks with a heel toe pattern at a decreased pace. Pt tolerated today's session without adverse effects.    Personal Factors and Comorbidities Fitness;Comorbidity  3+    Comorbidities obesity, CAD, HTN, tachycardia, DM    Examination-Activity Limitations Transfers;Squat;Stairs;Stand;Locomotion Level    Stability/Clinical Decision Making Stable/Uncomplicated    Clinical Decision Making Low    Rehab Potential Good    PT Frequency 2x / week    PT Duration 4 weeks    PT Treatment/Interventions ADLs/Self Care Home Management;Cryotherapy;Electrical Stimulation;Ultrasound;Moist Heat;Iontophoresis 4mg /ml Dexamethasone;Gait training;Stair training;Functional mobility training;Therapeutic activities;Therapeutic exercise;Balance training;Patient/family education;Manual techniques;Dry needling;Passive range of motion;Taping;Vasopneumatic Device    PT Next Visit Plan Assess response to HEP and progress PRN, use manuel techniques and modalities as indicated, progress calf strength and gait training    PT Home Exercise Plan [email or web address] - added light calf strengthening, 4 way ankle strengthening exs    Consulted and Agree with Plan of Care Patient           Patient will benefit from skilled therapeutic intervention in order to improve the following deficits and impairments:  Abnormal gait,Decreased range of motion,Difficulty walking,Obesity,Decreased activity tolerance,Pain,Decreased strength,Decreased balance  Visit Diagnosis: Decreased range of motion of left ankle  Difficulty in walking, not elsewhere classified  Muscle weakness (generalized)  Pain of left calf  Gastrocnemius strain, left, initial encounter  Other abnormalities of gait and mobility     Problem List Patient Active Problem List   Diagnosis Date Noted  . Pre-operative clearance 04/23/2020  . Gastrocnemius strain, left 03/23/2020  . Venous incompetence 12/28/2019  . Exertional dyspnea 11/08/2019  . Chronic right shoulder pain 09/02/2019  . Diabetic neuropathy (HCC) 08/16/2019  . Chest pain of uncertain etiology 04/27/2019  . CAD -S/P PCI 01/02/2019  . Obesity (BMI 30-39.9) 01/02/2019   . Orthostatic dizziness 01/02/2019  . Hyperlipidemia LDL goal <70 12/25/2018  . History of non-ST elevation myocardial infarction (NSTEMI) 12/23/2018  . Type 2 diabetes mellitus with complication (HCC)   . Tachycardia   . Hypertension    13/07/2018 MS, PT 04/30/20 2:24 PM   [facility] [address] [place], [place], [place] Phone: [phone]   Fax:  [phone]  Name: Deklan Minar MRN: [name] Date of Birth: [birth year]

## 2020-05-01 ENCOUNTER — Telehealth: Payer: Self-pay | Admitting: Cardiology

## 2020-05-01 NOTE — Telephone Encounter (Signed)
PT'S DAUGHTER AWARE OF RECOMMENDATIONS AND AGREES WITH PLAN .Zack Seal

## 2020-05-01 NOTE — Telephone Encounter (Signed)
Spoke with Dr Tresa Endo [place] results and appears there are some changes on myoview per note to Dr Tresa Endo from [name] PA Discussed with Dr Tresa Endo have pt come in to review findings on 05/06/20 at 9:00 am .Keith Newton

## 2020-05-01 NOTE — Telephone Encounter (Signed)
Follow UP;    Daughter is calling to see if pt's Stress test results are ready please?

## 2020-05-02 ENCOUNTER — Ambulatory Visit: Payer: 59

## 2020-05-02 ENCOUNTER — Other Ambulatory Visit: Payer: Self-pay

## 2020-05-02 DIAGNOSIS — R2689 Other abnormalities of gait and mobility: Secondary | ICD-10-CM

## 2020-05-02 DIAGNOSIS — R252 Cramp and spasm: Secondary | ICD-10-CM

## 2020-05-02 DIAGNOSIS — M25672 Stiffness of left ankle, not elsewhere classified: Secondary | ICD-10-CM

## 2020-05-02 DIAGNOSIS — S86112A Strain of other muscle(s) and tendon(s) of posterior muscle group at lower leg level, left leg, initial encounter: Secondary | ICD-10-CM

## 2020-05-02 DIAGNOSIS — R262 Difficulty in walking, not elsewhere classified: Secondary | ICD-10-CM

## 2020-05-02 DIAGNOSIS — M79662 Pain in left lower leg: Secondary | ICD-10-CM

## 2020-05-02 DIAGNOSIS — M6281 Muscle weakness (generalized): Secondary | ICD-10-CM

## 2020-05-02 NOTE — Therapy (Addendum)
[place], [place], [address] Phone: [phone]   Fax:  [phone]  Physical Therapy Treatment/Discharge  Patient Details  Name: Keith Newton MRN: [id number] Date of Birth: Dec 10, 1955 Referring Provider (PT): Aundra Dubin, [place]   Encounter Date: 05/02/2020   PT End of Session - 05/02/20 1403     Visit Number 5    Number of Visits 9    Date for PT Re-Evaluation 05/11/20    Authorization Type [employer]    PT Start Time [phone]    PT Stop Time 1003    PT Time Calculation (min) 47 min    Activity Tolerance Patient tolerated treatment well    Behavior During Therapy [facility] for tasks assessed/performed             Past Medical History:  Diagnosis Date   Arthritis    Back pain    CAD (coronary artery disease)    a. s/p NSTEMI in 12/2018 with 80% Prox-RCA stenosis followed by 100% prox to mid-RCA stenosis --> DESx2 to the RCA. Med management of residual disease.    Carpal tunnel syndrome    DDD (degenerative disc disease)    Diabetes mellitus    Hyperlipidemia    Hypertension    states very mild   Low testosterone    Overweight    Seasonal allergies    Tachycardia     Past Surgical History:  Procedure Laterality Date   CARDIAC CATHETERIZATION     CARPAL TUNNEL RELEASE  07/02/2011   Procedure: CARPAL TUNNEL RELEASE;  Surgeon: Cammie Sickle., MD;  Location: [place];  Service: Orthopedics;  Laterality: Right;   CARPAL TUNNEL RELEASE  07/30/2011   Procedure: CARPAL TUNNEL RELEASE;  Surgeon: Cammie Sickle., MD;  Location: [place];  Service: Orthopedics;  Laterality: Left;   CORONARY ANGIOPLASTY     CORONARY STENT INTERVENTION N/A 12/23/2018   Procedure: CORONARY STENT INTERVENTION;  Surgeon: Jettie Booze, MD;  Location: [facility];  Service: Cardiovascular;  Laterality: N/A;   LEFT HEART CATH AND CORONARY ANGIOGRAPHY N/A 12/23/2018    Procedure: LEFT HEART CATH AND CORONARY ANGIOGRAPHY;  Surgeon: Jettie Booze, MD;  Location: [facility];  Service: Cardiovascular;  Laterality: N/A;   LUMBAR EPIDURAL INJECTION     x2    There were no vitals filed for this visit.   Subjective Assessment - 05/02/20 0921     Subjective Pt reports L calf was sore after the last session, but it did not last. Overall, he reports hisl calf pain is alot better.    Limitations Standing;Walking    Patient Stated Goals For the pain to improve so he can walk comfortably.    Pain Score 5     Pain Orientation Posterior;Left    Pain Descriptors / Indicators Sore    Pain Type Chronic pain    Pain Onset More than a month ago    Pain Frequency Intermittent                [facility] PT Assessment - 05/02/20 0001       Observation/Other Assessments   Focus on Therapeutic Outcomes (FOTO)  71% functional ability      AROM   Overall AROM Comments Ankle DF L = 10d      Strength   Overall Strength Comments 4+/5  [facility] PT Treatment/Exercise - 05/02/20 0001       Ambulation/Gait   Gait Pattern Step-through pattern   heel/toe pattern at a decreased pace     Manual Therapy   Manual Therapy Soft tissue mobilization    Manual therapy comments STM with manual massage and IASTM c roller      Ankle Exercises: Stretches   Soleus Stretch 2 reps;20 seconds   standing   Gastroc Stretch 2 reps;30 seconds   standing   Other Stretch Ankle stretch into DF c strap, 3x, 30 sec      Ankle Exercises: Seated   Heel Raises Both;20 reps    Toe Raise 20 reps    Other Seated Ankle Exercises Banded L ankle exs, 4 way, yellow TBand 15x                    PT Education - 05/02/20 1403     Education Details HEP- standing gastroc stretch    Person(s) Educated Patient    Methods Explanation    Comprehension Verbalized understanding              PT Short Term Goals - 04/30/20 1404        PT SHORT TERM GOAL #1   Title He will be indppendent with intial HEP    Status Achieved    Target Date 04/28/20      PT SHORT TERM GOAL #2   Title Pt will voice undertsanding of messure to assist in the reduction of pain and swelling    Status Achieved    Target Date 04/28/20               PT Long Term Goals - 05/02/20 1406       PT LONG TERM GOAL #1   Title Pt will be Ind in a final HEP to maintain or progress achieved LOF    Status Achieved    Target Date 05/02/20      PT LONG TERM GOAL #2   Title Increase L ankle DF AROM to 10d for iproved function and gait of the L ankle/LE    Baseline 5d    Status New    Target Date 05/02/20      PT LONG TERM GOAL #3   Title Increased L ankle PF strength to 4+/5 strength for improved function of the L ankle/LE    Baseline improved but still painful    Status Achieved    Target Date 05/02/20      PT LONG TERM GOAL #4   Title Pt will report a decrease in L calf pain to 3/10 or less with daily activities. 05/02/20-5/10    Baseline 3-9/10    Status Partially Met    Target Date 05/02/20      PT LONG TERM GOAL #5   Title Pt will walk with a normalized gait pattern with equal step length and heel to toe pattern s an antalgic limp    Status Achieved    Target Date 05/02/20      PT LONG TERM GOAL #6   Title PT's FOTO score will improve to predicted value of 71% functional ability    Baseline 49%    Status Achieved    Target Date 05/02/20                   Plan - 05/02/20 1404     Clinical Impression Statement Pt has made good progress in PT WI:OXBD, ROM, and  function. The majority of goals have been met except for pain level which is improved, but stiil at a higher level than set goal. Pt's functional is improved, walking c a normal heel to toe pattern s limp with improved L ankle DF ROM. Pt' has some other medical concerns which are more pressing than his L calf at this time. Will contact pt on 05/10/20 to check  the status of his L calf and determine his need for PT.    Personal Factors and Comorbidities Fitness;Comorbidity 3+    Comorbidities obesity, CAD, HTN, tachycardia, DM    Examination-Activity Limitations Transfers;Squat;Stairs;Stand;Locomotion Level    Stability/Clinical Decision Making Stable/Uncomplicated    Clinical Decision Making Low    Rehab Potential Good    PT Frequency 2x / week    PT Duration 4 weeks    PT Treatment/Interventions ADLs/Self Care Home Management;Cryotherapy;Electrical Stimulation;Ultrasound;Moist Heat;Iontophoresis 66m/ml Dexamethasone;Gait training;Stair training;Functional mobility training;Therapeutic activities;Therapeutic exercise;Balance training;Patient/family education;Manual techniques;Dry needling;Passive range of motion;Taping;Vasopneumatic Device    PT Next Visit Plan Assess response to HEP and progress PRN, use manuel techniques and modalities as indicated, progress calf strength and gait training    PT Home Exercise Plan [email or web address] - added light calf strengthening, 4 way ankle strengthening exs, standing gastroc stretch    Consulted and Agree with Plan of Care Patient             Patient will benefit from skilled therapeutic intervention in order to improve the following deficits and impairments:  Abnormal gait,Decreased range of motion,Difficulty walking,Obesity,Decreased activity tolerance,Pain,Decreased strength,Decreased balance  Visit Diagnosis: Decreased range of motion of left ankle  Difficulty in walking, not elsewhere classified  Muscle weakness (generalized)  Pain of left calf  Gastrocnemius strain, left, initial encounter  Other abnormalities of gait and mobility  Cramp and spasm     Problem List Patient Active Problem List   Diagnosis Date Noted   Pre-operative clearance 04/23/2020   Gastrocnemius strain, left 03/23/2020   Venous incompetence 12/28/2019   Exertional dyspnea 11/08/2019   Chronic right shoulder pain  09/02/2019   [place]abetic [place] 08/16/2019   Chest pain of uncertain etiology 081/85/6314  CAD -S/P PCI 01/02/2019   Obesity (BMI 30-39.9) 01/02/2019   Orthostatic dizziness 01/02/2019   Hyperlipidemia LDL goal <70 12/25/2018   History of non-ST elevation myocardial infarction (NSTEMI) 12/23/2018   Type 2 diabetes mellitus with complication (HCC)    Tachycardia    Hypertension     AGar PontoMS, PT 05/02/20 4:01 PM  [facility]G[place] [address]Phone: [phone]  Fax:  [phone] Name: Keith AngellMRN: [id number] of Birth: [birth year] PHYSICAL THERAPY DISCHARGE SUMMARY  Visits from Start of Care: 5  Current functional level related to goals / functional outcomes: See above   Remaining deficits: See above   Education / Equipment: HEP  Patient agrees to discharge. Patient goals were met. Patient is being discharged due to meeting the stated rehab goals.   Milbert Bixler MS, PT 09/14/20 10:09 AM

## 2020-05-06 ENCOUNTER — Ambulatory Visit (INDEPENDENT_AMBULATORY_CARE_PROVIDER_SITE_OTHER): Payer: 59 | Admitting: Cardiovascular Disease

## 2020-05-06 ENCOUNTER — Other Ambulatory Visit: Payer: Self-pay | Admitting: Cardiovascular Disease

## 2020-05-06 ENCOUNTER — Other Ambulatory Visit: Payer: Self-pay

## 2020-05-06 ENCOUNTER — Encounter: Payer: Self-pay | Admitting: Cardiovascular Disease

## 2020-05-06 VITALS — BP 140/90 | HR 82 | Ht 68.0 in | Wt 247.0 lb

## 2020-05-06 DIAGNOSIS — E118 Type 2 diabetes mellitus with unspecified complications: Secondary | ICD-10-CM | POA: Diagnosis not present

## 2020-05-06 DIAGNOSIS — I2510 Atherosclerotic heart disease of native coronary artery without angina pectoris: Secondary | ICD-10-CM

## 2020-05-06 DIAGNOSIS — E785 Hyperlipidemia, unspecified: Secondary | ICD-10-CM

## 2020-05-06 DIAGNOSIS — E669 Obesity, unspecified: Secondary | ICD-10-CM

## 2020-05-06 DIAGNOSIS — I10 Essential (primary) hypertension: Secondary | ICD-10-CM

## 2020-05-06 DIAGNOSIS — Z9861 Coronary angioplasty status: Secondary | ICD-10-CM

## 2020-05-06 DIAGNOSIS — Z01818 Encounter for other preprocedural examination: Secondary | ICD-10-CM

## 2020-05-06 DIAGNOSIS — R0683 Snoring: Secondary | ICD-10-CM

## 2020-05-06 MED ORDER — METOPROLOL TARTRATE 75 MG PO TABS: 75.0000 mg | ORAL_TABLET | Freq: Two times a day (BID) | ORAL | 3 refills | Status: DC

## 2020-05-06 NOTE — Progress Notes (Signed)
Cardiology Office Note    Date:  05/08/2020   ID:  Keith, Newton 01-Nov-1955, MRN [id number]  PCP:  Benay Pike, MD  Cardiologist:  Shelva Majestic, MD    F/U office visit with me  History of Present Illness:  Keith Newton is a 65 y.o. male who I saw for his initial post hospital evaluation in January 2021.  I last saw him in April 2021.  He presents for an 64-monthfollow-up evaluation and preoperative clearance prior to undergoing right shoulder surgery.  Keith Newton a history of diabetes mellitus, obesity, and was hospitalized on December 23, 2018 after awakening from sleep with severe chest pain.  I had seen him for initial evaluation and blood pressure on presentation was 181/114 with initial high-sensitivity troponin at 4628.  He underwent emergent cardiac catheterization by Dr. VIrish Lackand was found to have an 80% proximal gnosis followed by total occlusion of the RCA.  He underwent successful stenting with tandem 3.0x16 and 2.75 x 38 mm Synergy stents.  He had concomitant CAD with 40% proximal LAD stenosis, 25% mid LAD stenosis, 50% mid circumflex stenosis and 75% ostial PDA stenosis.  An echo Doppler study showed preserved global LV function with EF 55 to 60%.  There was moderate LVH and basal inferior hypokinesis.  The patient has been seen by [name], on 2 occasions following his hospitalization with his last visit in November 2020.    I saw him for initial evaluation with me on March 01, 2019.  He had lost almost 30 pounds since his myocardial infarction.  He denied any definitive anginal type symptoms but at times does experience a vague chest sensation.  Of note, he was awakened from sleep with his initial MI.  He also admits that he continues to have difficulty with sleep and at times he feels that he cannot get enough air while sleeping.  He does admit to snoring, frequent awakenings, and nocturia at least 3 times per night.  He also has noticed that his pulse  rate tends to be increased and at times increases to over 100 in a regular rhythm.  During that evaluation, I discussed with him my concern that his initial event awakened him from a sound sleep and was concerned about obstructive sleep apnea contributing to nocturnal hypoxemia contributing to his acute coronary syndrome.  He [facility].  He had noted some mild intermittent chest pains that were not exertionally precipitated and also complained of some numbness in his feet.  He was told to take Imdur in the morning instead of at bedtime.  I last saw him in April 2021 which times he was experiencing rare nonexertional episodes of chest discomfort.  At times it can last up to 20 to 25 minutes and then resolved.  He has continued to be on isosorbide 30 mg in addition to metoprolol tartrate 50 mg twice a day for anti-ischemic medication.  He was on aspirin and Brilinta for DAPT.  He was on atorvastatin 80 mg and LDL cholesterol was 55 on March 01 2019. He is diabetic on Tresiba, Farxiga and Metformin.    Over the past year, he has been stable from a cardiac standpoint.   Anticipation of undergoing shoulder surgery, he had a Myoview study done in November 17, 2019.  This was low risk with a nuclear EF of 53% and normal wall motion.  He was evaluated  in  December 2021 and was cleared to undergo right  shoulder surgery.  With the surge in Covid, this elective surgery was canceled.  He was recently seen on April 23, 2020 by Kerin Ransom and had complaints of an intermittent chest sensation.  He was unable to exercise secondary to neuropathy.  He walks to the mailbox and back without discomfort.  He was referred for a [place] study.   The formal report was interpreted as intermediate study with prior MI with peri-infarct ischemia.  Was concern for possible defect in the very basal septal are.   As only, the patient denies any chest pain.  He states  his sensation that he had experienced was a transient fluttering in his chest and not chest tightness.  This fluttering sensation is completely different from his MI pain when he was found to have total occlusion of his RCA.  At the time he had mild concomitant CAD with 75% PDA stenosis.  EF was 55 to 60% with basal hypokinesis by echo.  He presents for preoperative evaluation.  Past Medical History:  Diagnosis Date  . Arthritis   . Back pain   . CAD (coronary artery disease)    a. s/p NSTEMI in 12/2018 with 80% Prox-RCA stenosis followed by 100% prox to mid-RCA stenosis --> DESx2 to the RCA. Med management of residual disease.   . Carpal tunnel syndrome   . DDD (degenerative disc disease)   . Diabetes mellitus   . Hyperlipidemia   . Hypertension    states very mild  . Low testosterone   . Overweight   . Seasonal allergies   . Tachycardia     Past Surgical History:  Procedure Laterality Date  . CARDIAC CATHETERIZATION    . CARPAL TUNNEL RELEASE  07/02/2011   Procedure: CARPAL TUNNEL RELEASE;  Surgeon: Cammie Sickle., MD;  Location: [facility];  Service: Orthopedics;  Laterality: Right;  . CARPAL TUNNEL RELEASE  07/30/2011   Procedure: CARPAL TUNNEL RELEASE;  Surgeon: Cammie Sickle., MD;  Location: [facility];  Service: Orthopedics;  Laterality: Left;  . CORONARY ANGIOPLASTY    . CORONARY STENT INTERVENTION N/A 12/23/2018   Procedure: CORONARY STENT INTERVENTION;  Surgeon: Jettie Booze, MD;  Location: [facility];  Service: Cardiovascular;  Laterality: N/A;  . LEFT HEART CATH AND CORONARY ANGIOGRAPHY N/A 12/23/2018   Procedure: LEFT HEART CATH AND CORONARY ANGIOGRAPHY;  Surgeon: Jettie Booze, MD;  Location: [facility];  Service: Cardiovascular;  Laterality: N/A;  . LUMBAR EPIDURAL INJECTION     x2    Current Medications: Outpatient Medications Prior to Visit  Medication Sig Dispense Refill  . acetaminophen  (TYLENOL) 325 MG tablet Take 650 mg by mouth every 6 (six) hours as needed.    Marland Kitchen aspirin EC 81 MG EC tablet Take 1 tablet (81 mg total) by mouth daily.    Marland Kitchen atorvastatin (LIPITOR) 80 MG tablet TAKE 1 TABLET BY MOUTH EVERY DAY 90 tablet 3  . Baclofen 5 MG TABS Take 5 mg by mouth at bedtime as needed. 30 tablet 1  . Blood Glucose Monitoring Suppl (ONETOUCH VERIO IQ SYSTEM) w/Device KIT Check fasting blood glucose in AM and before giving insulin. 1 kit 1  . BRILINTA 90 MG TABS tablet TAKE 1 TABLET BY MOUTH TWO TIMES DAILY 180 tablet 3  . dapagliflozin propanediol (FARXIGA) 10 MG TABS tablet Take 1 tablet (10 mg total) by mouth daily. 90 tablet 3  .  Dulaglutide (TRULICITY) 0.76 KG/8.8PJ SOPN Inject 0.75 mg into the skin once a week. 2 mL 1  . Elastic Bandages & Supports (MEDICAL COMPRESSION STOCKINGS) MISC Wear daily 2 each 1  . glucose blood (ONETOUCH VERIO) test strip Use as instructed 100 each 12  . HYDROcodone-acetaminophen (NORCO) 5-325 MG tablet Take 1 tablet by mouth 3 (three) times daily as needed. 30 tablet 0  . insulin degludec (TRESIBA FLEXTOUCH) 100 UNIT/ML SOPN FlexTouch Pen Inject 40 Units into the skin daily.    . Insulin Pen Needle 32G X 4 MM MISC Inject into the skin daily.    . isosorbide mononitrate (IMDUR) 60 MG 24 hr tablet Take 1 tablet (60 mg total) by mouth daily. 90 tablet 3  . Lancet Devices (ONE TOUCH DELICA LANCING DEV) MISC Check fasting blood glucose in AM and before giving insulin. 1 each 11  . metFORMIN (GLUCOPHAGE) 1000 MG tablet TAKE 1 TABLET BY MOUTH TWICE A DAY 180 tablet 1  . methocarbamol (ROBAXIN) 500 MG tablet Take 1 tablet (500 mg total) by mouth 2 (two) times daily as needed. 20 tablet 0  . nitroGLYCERIN (NITROSTAT) 0.4 MG SL tablet Place 1 tablet (0.4 mg total) under the tongue every 5 (five) minutes x 3 doses as needed for chest pain. 25 tablet 3  . OneTouch Delica Lancets 03P MISC Check fasting blood glucose in AM and before giving insulin. 100 each 11  .  pregabalin (LYRICA) 75 MG capsule Take 1 capsule (75 mg total) by mouth 3 (three) times daily. 90 capsule 1  . traMADol (ULTRAM) 50 MG tablet Take 1 tablet (50 mg total) by mouth 3 (three) times daily as needed. 30 tablet 0  . traMADol (ULTRAM) 50 MG tablet Take 1-2 tablets (50-100 mg total) by mouth daily as needed. 20 tablet 0  . metoprolol tartrate (LOPRESSOR) 50 MG tablet TAKE 1 TABLET BY MOUTH TWICE A DAY 180 tablet 3   No facility-administered medications prior to visit.     Allergies:   Codeine   Social History   Socioeconomic History  . Marital status: Married    Spouse name: Cletus Paris  . Number of children: 2  . Years of education: 16  . Highest education level: Bachelor's degree (e.g., BA, AB, BS)  Occupational History  . Occupation: retired  Tobacco Use  . Smoking status: Never Smoker  . Smokeless tobacco: Never Used  Vaping Use  . Vaping Use: Never used  Substance and Sexual Activity  . Alcohol use: No  . Drug use: No  . Sexual activity: Not on file  Other Topics Concern  . Not on file  Social History Narrative  . Not on file   Social Determinants of Health   Financial Resource Strain: Not on file  Food Insecurity: Not on file  Transportation Needs: Not on file  Physical Activity: Not on file  Stress: Not on file  Social Connections: Not on file     Family History:  The patient's family history includes CAD in his father, maternal grandfather, and paternal grandfather; Stroke in his mother.   ROS General: Negative; No fevers, chills, or night sweats;  HEENT: Negative; No changes in vision or hearing, sinus congestion, difficulty swallowing Pulmonary: Negative; No cough, wheezing, shortness of breath, hemoptysis Cardiovascular: See HPI GI: Negative; No nausea, vomiting, diarrhea, or abdominal pain GU: Negative; No dysuria, hematuria, or difficulty voiding Musculoskeletal: Negative; no myalgias, joint pain, or weakness Hematologic/Oncology: Negative;  no easy bruising, bleeding Endocrine: Positive for diabetes  Neuro: Negative; no changes in balance, headaches Skin: Negative; No rashes or skin lesions Psychiatric: Negative; No behavioral problems, depression Sleep: Poor sleep with frequent awakenings, snoring, and at times a sensation that he cannot get enough breath.  No bruxism, restless legs, hypnogognic hallucinations, no cataplexy Other comprehensive 14 point system review is negative.   PHYSICAL EXAM:   VS:  BP 140/90 (BP Location: Left Arm, Patient Position: Sitting)   Pulse 82   Ht 5' 8" (1.727 m)   Wt 247 lb (112 kg)   SpO2 98%   BMI 37.56 kg/m     Repeat blood pressure by me was 122/84  Wt Readings from Last 3 Encounters:  05/06/20 247 lb (112 kg)  04/26/20 250 lb (113.4 kg)  04/23/20 250 lb (113.4 kg)    General: Alert, oriented, no distress.  Skin: normal turgor, no rashes, warm and dry HEENT: Normocephalic, atraumatic. Pupils equal round and reactive to light; sclera anicteric; extraocular muscles intact;  Nose without nasal septal hypertrophy Mouth/Parynx benign; Mallinpatti scale 3 Neck: No JVD, no carotid bruits; normal carotid upstroke Lungs: clear to ausculatation and percussion; no wheezing or rales Chest wall: without tenderness to palpitation Heart: PMI not displaced, RRR, s1 s2 normal, 1/6 systolic murmur, no diastolic murmur, no rubs, gallops, thrills, or heaves Abdomen: soft, nontender; no hepatosplenomehaly, BS+; abdominal aorta nontender and not dilated by palpation. Back: no CVA tenderness Pulses 2+ Musculoskeletal: full range of motion, normal strength, no joint deformities Extremities: no clubbing cyanosis or edema, Homan's sign negative  Neurologic: grossly nonfocal; Cranial nerves grossly wnl Psychologic: Normal mood and affect   Studies/Labs Reviewed:   EKG:  EKG is ordered today.  ECG (independently read by me): NSR at 82; small inferior Q wave in III. No ectopy  June 02, 2019 ECG  (independently read by me): Sinus rhythm with sinus arrhythmia.  Mild T wave abnormality inferiorly.  QTc interval 419 ms.  PR interval 160 ms.  March 01, 2019 ECG (independently read by me): Normal sinus rhythm at 96 bpm.  Q wave in lead III.  QTc interval 434 ms.  No ectopy.  Recent Labs: BMP Latest Ref Rng & Units 01/31/2020 03/01/2019 01/02/2019  Glucose 65 - 99 mg/dL 182(H) 159(H) 191(H)  BUN 8 - 27 mg/dL _0 Creatinine 0.76 - 1.27 mg/dL 0.96 1.04 1.21  BUN/Creat Ratio 10 - _1 Sodium 134 - 144 mmol/L 139 139 138  Potassium 3.5 - 5.2 mmol/L 4.6 4.6 4.4  Chloride 96 - 106 mmol/L 101 99 100  CO2 20 - 29 mmol/L 19(L) 20 20  Calcium 8.6 - 10.2 mg/dL 10.3(H) 10.0 10.4(H)     Hepatic Function Latest Ref Rng & Units 03/01/2019 12/25/2018  Total Protein 6.0 - 8.5 g/dL 7.5 6.8  Albumin 3.8 - 4.8 g/dL 4.7 3.2(L)  AST 0 - 40 IU/L 18 51(H)  ALT 0 - 44 IU/L 23 25  Alk Phosphatase 39 - 117 IU/L 84 52  Total Bilirubin 0.0 - 1.2 mg/dL 0.8 1.0    CBC Latest Ref Rng & Units 03/01/2019 12/24/2018 12/23/2018  WBC 3.4 - 10.8 x10E3/uL 6.6 8.3 8.6  Hemoglobin 13.0 - 17.7 g/dL 15.1 13.9 15.7  Hematocrit 37.5 - 51.0 % 44.5 42.9 48.3  Platelets 150 - 450 x10E3/uL 309 275 285   Lab Results  Component Value Date   MCV 83 03/01/2019   MCV 84.8 12/24/2018   MCV 85.8 12/23/2018   Lab Results  Component Value Date  TSH 0.418 12/23/2018   Lab Results  Component Value Date   HGBA1C 8.9 (A) 03/21/2020     BNP No results found for: BNP  ProBNP No results found for: PROBNP   Lipid Panel     Component Value Date/Time   CHOL 110 03/01/2019 1051   TRIG 110 03/01/2019 1051   HDL 35 (L) 03/01/2019 1051   CHOLHDL 3.1 03/01/2019 1051   CHOLHDL 6.6 12/24/2018 0214   VLDL 25 12/24/2018 0214   LDLCALC 55 03/01/2019 1051   LABVLDL 20 03/01/2019 1051     RADIOLOGY: MYOCARDIAL PERFUSION IMAGING  Result Date: 04/26/2020  Nuclear stress EF: 42%.  The left ventricular ejection  fraction is moderately decreased (30-44%).  There was no ST segment deviation noted during stress.  Defect 1: There is a medium defect of moderate severity present in the basal anteroseptal and basal inferoseptal location.  Findings consistent with prior myocardial infarction with peri-infarct ischemia.  This is an intermediate risk study.  There is a defect in the basal septum that worsens with stress. This is also consistent with an area of dyskinesis. Suggestive of infarct with peri-infarct ischemia. TID elevated, cannot exclude global ischemia. This is a change since prior study. Prior images personally reviewed.     Additional studies/ records that were reviewed today include:   EMERGENT CATH/PCI: 12/23/2018  Prox - mid RCA-2 lesion is 100% stenosed. This was the culprit lesion.  A drug-eluting stent was successfully placed using a STENT SYNERGY DES [id number].  Post intervention, there is a 0% residual stenosis.  Prox RCA-1 lesion is 80% stenosed.  A drug-eluting stent was successfully placed using a STENT SYNERGY DES 3X16, overlapping the first stent.  Post intervention, there is a 0% residual stenosis.  RPDA lesion is 75% stenosed. This was a small vessel. Medical treatment planned.  Mid Cx lesion is 50% stenosed.  Mid LAD lesion is 40% stenosed.  Mid LAD to Dist LAD lesion is 25% stenosed.  The left ventricular systolic function is normal.  LV end diastolic pressure is normal.  The left ventricular ejection fraction is 55-65% by visual estimate.  There is no aortic valve stenosis.  Tortuosity in right subclavian did not allow stiffer Cordis XBRCA catheter to pass into ascending aorta. Medtronic Launcher catheter was able to pass from right radial approach.   Watch in [place].  Continue aggressive secondary prevention.  He needs weight loss and DM control, high dose statin and beta blocker.   He is interested in learning more about exercise and healthy diet.  Recommend  cardiac rehab.   DAPT for 1 year given ACS.    Intervention     ECHO 12/23/2018 IMPRESSIONS  1. Left ventricular ejection fraction, by visual estimation, is 55 to 60%. The left ventricle has normal function. There is moderately increased left ventricular hypertrophy. Basal inferior hypokinesis.  2. Global right ventricle has mildly reduced systolic function.The right ventricular size is normal. No increase in right ventricular wall thickness.  3. Left atrial size was normal.  4. Right atrial size was normal.  5. The mitral valve is normal in structure. No evidence of mitral valve regurgitation.  6. The tricuspid valve is normal in structure. Tricuspid valve regurgitation is trivial.  7. The aortic valve is tricuspid. Aortic valve regurgitation is not visualized.  8. The pulmonic valve was not well visualized. Pulmonic valve regurgitation is trivial.   ASSESSMENT:    1. Pre-operative clearance   2. CAD -S/P PCI   3. Hyperlipidemia  LDL goal <70   4. Type 2 diabetes mellitus with complication (HCC)   5. Essential hypertension   6. Obesity, Class II, BMI 35-39.9   7. Snoring     PLAN:  Keith. Winfrey Newton is a pleasant 65 year old gentleman who has a history of obesity, diabetes mellitus, and was awakened from sleep with severe chest pain on December 23, 2018.  He underwent emergent catheterization which revealed total RCA occlusion which was successfully stented with tandem Synergy DES stents.  He appears to have had significant salvage of myocardium with  his initial echo Doppler study showed an EF of 55 to 60% with only a small area of basal inferior hypokinesis.  The patient has noticed increased heart rate since his MI despite being on metoprolol tartrate 25 mg twice a day.  When I initially saw him for follow-up in January 2021 ECG showed sinus rhythm at 96 bpm with a Q-wave in lead III.  He was hypertensive when his blood pressure was initially taken by the nurse but on repeat by me  was significantly improved at 112/70.  At that time I recommended further titration of metoprolol to 50 mg twice a day and he continued to be on low-dose isosorbide mononitrate.  Continued medical therapy was recommended for his concomitant CAD.  The time I also discussed possible sleep evaluation NT admitted to snoring, frequent awakenings, nocturia 3 times per night with difficulty in sleeping.  Although a sleep study was strongly recommended this was never pursued by the patient.  He is in need for right shoulder surgery.  I personally reviewed his nuclear perfusion study from October 2021 and his most recent study of March 2022.  The most recent study, there is imaging shown from a more apical portion which shows an apical defect consistent with scar/ischemia more prominent on the present 1 but this image was not on the prior patient.  The present study argues against significant restenosis in the RCA stented segment and perfusion is otherwise fairly normal.  There is mild basal thinning but I suspect this potentially may be contributed by some attenuation.  He specifically denies any chest tightness with walking or pressure.  His chest sensation had been a sensation of fluttering.  With his resting pulse in the 80s and his initial blood pressure 140/90 and with his concomitant CAD I have recommended further titration of metoprolol tartrate up to 75 mg twice a day.  Have recommended reduction in caffeine use.  He will continue his current dose of isosorbide mononitrate 60 mg.  He is on aspirin and Brilinta.  Based on his current assessment, I will give him clearance to undergo shoulder surgery.  He will need to hold his Brilinta for a minimum of 5 days prior to surgery.  He is diabetic on Farxiga in addition to insulin.  We discussed the importance of weight loss with a BMI of 37.56.  He believes he is sleeping well future sleep study may be worthwhile.   Medication Adjustments/Labs and Tests  Ordered: Current medicines are reviewed at length with the patient today.  Concerns regarding medicines are outlined above.  Medication changes, Labs and Tests ordered today are listed in the Patient Instructions below. Patient Instructions  Medication Instructions:  Increase Metoprolol tartrate to 75 mg daily.  *If you need a refill on your cardiac medications before your next appointment, please call your pharmacy*   Lab Work: None   Testing/Procedures: None   Follow-Up: At [facility], you and your  health needs are our priority.  As part of our continuing mission to provide you with exceptional heart care, we have created designated Provider Care Teams.  These Care Teams include your primary Cardiologist (physician) and Advanced Practice Providers (APPs -  Physician Assistants and Nurse Practitioners) who all work together to provide you with the care you need, when you need it.  We recommend signing up for the patient portal called "MyChart".  Sign up information is provided on this After Visit Summary.  MyChart is used to connect with patients for Virtual Visits (Telemedicine).  Patients are able to view lab/test results, encounter notes, upcoming appointments, etc.  Non-urgent messages can be sent to your provider as well.   To learn more about what you can do with MyChart, go to [email or web address].    Your next appointment:   6 month(s)  The format for your next appointment:   In Person  Provider:   Shelva Majestic, MD   Other Instructions Ok to proceed with surgery      Signed, Shelva Majestic, MD  05/08/2020 4:11 PM    [facility] [address], [place], [place], [place]  [address] Phone: [phone]

## 2020-05-06 NOTE — Patient Instructions (Signed)
Medication Instructions:  Increase Metoprolol tartrate to 75 mg daily.  *If you need a refill on your cardiac medications before your next appointment, please call your pharmacy*   Lab Work: None   Testing/Procedures: None   Follow-Up: At [facility], you and your health needs are our priority.  As part of our continuing mission to provide you with exceptional heart care, we have created designated Provider Care Teams.  These Care Teams include your primary Cardiologist (physician) and Advanced Practice Providers (APPs -  Physician Assistants and Nurse Practitioners) who all work together to provide you with the care you need, when you need it.  We recommend signing up for the patient portal called "MyChart".  Sign up information is provided on this After Visit Summary.  MyChart is used to connect with patients for Virtual Visits (Telemedicine).  Patients are able to view lab/test results, encounter notes, upcoming appointments, etc.  Non-urgent messages can be sent to your provider as well.   To learn more about what you can do with MyChart, go to [email or web address].    Your next appointment:   6 month(s)  The format for your next appointment:   In Person  Provider:   Nicki Guadalajara, MD   Other Instructions Ok to proceed with surgery

## 2020-05-08 ENCOUNTER — Encounter: Payer: Self-pay | Admitting: Cardiovascular Disease

## 2020-05-09 ENCOUNTER — Other Ambulatory Visit: Payer: Self-pay

## 2020-05-09 ENCOUNTER — Encounter: Payer: Self-pay | Admitting: Family Medicine

## 2020-05-09 ENCOUNTER — Ambulatory Visit (INDEPENDENT_AMBULATORY_CARE_PROVIDER_SITE_OTHER): Payer: 59 | Admitting: Family Medicine

## 2020-05-09 DIAGNOSIS — E118 Type 2 diabetes mellitus with unspecified complications: Secondary | ICD-10-CM | POA: Diagnosis not present

## 2020-05-09 DIAGNOSIS — Z419 Encounter for procedure for purposes other than remedying health state, unspecified: Secondary | ICD-10-CM | POA: Diagnosis not present

## 2020-05-09 NOTE — Assessment & Plan Note (Signed)
Patient to have shoulder surgery.  Not yet scheduled.  Was sent here by orthopedist to assess his risk for surgery.  Risks include his diabetes, weight, age.  Has already been cleared by cardiology according to the patient.  From my standpoint he is a low risk surgical candidate.  His A1c is elevated at 8.9 but this has been improving and I expected it for improve even more when he starts taking his Trulicity this week.  His weight should also improve with the Trulicity.  BMI is under 40.  Creatinine in December was normal.  Patient will need to hold his Brilinta before surgery, but his surgeon should discuss this further with him as to the exact date.  We will send in paperwork stating patient is okay for surgery.

## 2020-05-09 NOTE — Assessment & Plan Note (Signed)
Patient's most recent A1c was 8.9.  This was 1 month ago.  Not due for another one at this time.  His A1c's have continued to trend downward.  This will only improve once he starts taking his Trulicity.  Advised patient to follow-up in 2 months.

## 2020-05-09 NOTE — Progress Notes (Signed)
    SUBJECTIVE:   CHIEF COMPLAINT / HPI:   Surgery clearance: Patient here today because he was told he needed clearance for his shoulder surgery due to his A1c being elevated.  Has already gotten cardiology clearance.  Patient feels well.  Has no concerns.  Has some difficulty paying for Lyrica and Trulicity but will pick them up tomorrow he states.  PERTINENT  PMH / PSH: DM  OBJECTIVE:   BP 106/70   Pulse (!) 104   Ht 5\' 8"  (1.727 m)   Wt 247 lb 6.4 oz (112.2 kg)   SpO2 94%   BMI 37.62 kg/m   General: Alert, oriented.  No acute distress.  Obese male, appears stated age.  Daughter is on the phone. HEENT: Moist oral mucosa, no tonsillar hypertrophy. CV: Regular rate and rhythm, no murmurs Pulmonary: Lungs clear auscultation bilaterally, no wheezes or crackles. GI: Large pannus.  ASSESSMENT/PLAN:   Type 2 diabetes mellitus with complication (HCC) Patient's most recent A1c was 8.9.  This was 1 month ago.  Not due for another one at this time.  His A1c's have continued to trend downward.  This will only improve once he starts taking his Trulicity.  Advised patient to follow-up in 2 months.  Surgery, elective Patient to have shoulder surgery.  Not yet scheduled.  Was sent here by orthopedist to assess his risk for surgery.  Risks include his diabetes, weight, age.  Has already been cleared by cardiology according to the patient.  From my standpoint he is a low risk surgical candidate.  His A1c is elevated at 8.9 but this has been improving and I expected it for improve even more when he starts taking his Trulicity this week.  His weight should also improve with the Trulicity.  BMI is under 40.  Creatinine in December was normal.  Patient will need to hold his Brilinta before surgery, but his surgeon should discuss this further with him as to the exact date.  We will send in paperwork stating patient is okay for surgery.     January, MD [facility]

## 2020-05-09 NOTE — Patient Instructions (Signed)
It was nice to see you again today,  I will send the surgical clearance paperwork in to your orthopedic office.  Please schedule follow-up appointment in 2 months.  Have a great day,  Frederic Jericho, MD

## 2020-05-14 ENCOUNTER — Telehealth: Payer: Self-pay

## 2020-05-14 ENCOUNTER — Other Ambulatory Visit: Payer: Self-pay | Admitting: Family Medicine

## 2020-05-14 NOTE — Telephone Encounter (Signed)
Left voicemail asking for call back regarding medication copays. Call back number [phone]    Pt had stated his copays on trulicity and lyrica were expensive. I had attemped to call in a copay card to help with pricing but pharmacy informed me that his copays were most likely as cheap as they would get. His Lyrica is $16 and the Trulicity is $25. The only other option I believe the patient can take is filling the medication with one of our outpatient pharmacies, where he has access to using a charge account. He can pick up the meds and pay whatever minimum payment they provide. I know [facility] does this now, but with pharmacy changes happening I believe all outpatients are going to have this option soon.

## 2020-05-20 ENCOUNTER — Other Ambulatory Visit: Payer: Self-pay | Admitting: Cardiovascular Disease

## 2020-05-22 ENCOUNTER — Other Ambulatory Visit: Payer: Self-pay

## 2020-05-23 ENCOUNTER — Other Ambulatory Visit: Payer: Self-pay

## 2020-05-23 MED ORDER — TRESIBA FLEXTOUCH 100 UNIT/ML ~~LOC~~ SOPN: 40.0000 [IU] | PEN_INJECTOR | Freq: Every day | SUBCUTANEOUS | 4 refills | Status: DC

## 2020-05-27 ENCOUNTER — Other Ambulatory Visit: Payer: Self-pay | Admitting: Cardiovascular Disease

## 2020-05-29 ENCOUNTER — Telehealth: Payer: Self-pay | Admitting: Cardiovascular Disease

## 2020-05-29 MED ORDER — METOPROLOL TARTRATE 25 MG PO TABS
75.0000 mg | ORAL_TABLET | Freq: Two times a day (BID) | ORAL | 11 refills | Status: DC
Start: 2020-05-29 — End: 2020-08-07

## 2020-05-29 MED ORDER — METOPROLOL TARTRATE 25 MG PO TABS: 25.0000 mg | ORAL_TABLET | Freq: Two times a day (BID) | ORAL | 3 refills | Status: DC

## 2020-05-29 NOTE — Telephone Encounter (Signed)
Spoke to pt's daughter who report it will cost $147 for Metoprolol tartrate 75 mg. She report when pt had a prescription for 50 mg, it was much cheaper.   Nurse contacted [facility] and was informed if we send a prescription for a 30 day supply of 25 mg (3 tablets for a total of 75 mg) BID would be free for pt. Will route to pharm D for approval to change dose.

## 2020-05-29 NOTE — Telephone Encounter (Signed)
Yes, this is totally fine. Please just let the patient know that it will be changing

## 2020-05-29 NOTE — Telephone Encounter (Signed)
Pt c/o medication issue:  1. Name of Medication: Metoprolol Tartrate 75 MG TABS  2. How are you currently taking this medication (dosage and times per day)? Patient has not started taking this yet   3. Are you having a reaction (difficulty breathing--STAT)?   4. What is your medication issue? Daughter of the patient called. The rx would cost the patient $147. The daughter wanted to know if there is an alternate to this medication

## 2020-05-29 NOTE — Telephone Encounter (Signed)
Daughter updated and verbalized understanding. New order placed for Metoprolol 25 mg: take 3 tablet for a total of 75 mg twice a day.

## 2020-05-30 ENCOUNTER — Other Ambulatory Visit (HOSPITAL_COMMUNITY)
Admission: RE | Admit: 2020-05-30 | Discharge: 2020-05-30 | Disposition: A | Discharge status: Home / Self Care | Payer: 59 | Source: Ambulatory Visit | Attending: Orthopedic Surgery | Admitting: Orthopedic Surgery

## 2020-05-30 DIAGNOSIS — Z20822 Contact with and (suspected) exposure to covid-19: Secondary | ICD-10-CM | POA: Insufficient documentation

## 2020-05-30 DIAGNOSIS — Z01812 Encounter for preprocedural laboratory examination: Secondary | ICD-10-CM | POA: Diagnosis present

## 2020-05-30 LAB — SARS CORONAVIRUS 2 (TAT 6-24 HRS): SARS Coronavirus 2: NEGATIVE

## 2020-05-31 ENCOUNTER — Encounter (HOSPITAL_COMMUNITY): Payer: Self-pay | Admitting: Orthopedic Surgery

## 2020-05-31 NOTE — Progress Notes (Signed)
Anesthesia Chart Review: Same day workup  Follows with cardiology for history of CAD s/p MI treated with DES x2 to RCA 12/23/2018.  Last seen by Dr. Tresa Endo 05/06/2020 and cleared for surgery at that time.  Per note, " He is on aspirin and Brilinta.  Based on his current assessment, I will give him clearance to undergo shoulder surgery.  He will need to hold his Brilinta for a minimum of 5 days prior to surgery."  Dr. Landry Dyke note also states concern for OSA; patient has not had a sleep study.  IDDM 2, last A1c 8.9 on 03/21/2020.  Will need day of surgery labs and evaluation.  EKG 05/06/2020: NSR.  Rate 82.  Possible inferior infarct, age-indeterminate.  Nuclear stress 04/26/2020:  Nuclear stress EF: 42%.  The left ventricular ejection fraction is moderately decreased (30-44%).  There was no ST segment deviation noted during stress.  Defect 1: There is a medium defect of moderate severity present in the basal anteroseptal and basal inferoseptal location.  Findings consistent with prior myocardial infarction with peri-infarct ischemia.  This is an intermediate risk study.   There is a defect in the basal septum that worsens with stress. This is also consistent with an area of dyskinesis. Suggestive of infarct with peri-infarct ischemia. TID elevated, cannot exclude global ischemia. This is a change since prior study. Prior images personally reviewed.   Dr. Tresa Endo commented on these results and his office visit note 05/06/2020 stating, " I personally reviewed his nuclear perfusion study from October 2021 and his most recent study of March 2022.  The most recent study, there is imaging shown from a more apical portion which shows an apical defect consistent with scar/ischemia more prominent on the present 1 but this image was not on the prior patient.  The present study argues against significant restenosis in the RCA stented segment and perfusion is otherwise fairly normal.  There is mild basal  thinning but I suspect this potentially may be contributed by some attenuation. "   TTE 12/23/2018: 1. Left ventricular ejection fraction, by visual estimation, is 55 to  60%. The left ventricle has normal function. There is moderately increased  left ventricular hypertrophy. Basal inferior hypokinesis.  2. Global right ventricle has mildly reduced systolic function.The right  ventricular size is normal. No increase in right ventricular wall  thickness.  3. Left atrial size was normal.  4. Right atrial size was normal.  5. The mitral valve is normal in structure. No evidence of mitral valve  regurgitation.  6. The tricuspid valve is normal in structure. Tricuspid valve  regurgitation is trivial.  7. The aortic valve is tricuspid. Aortic valve regurgitation is not  visualized.  8. The pulmonic valve was not well visualized. Pulmonic valve  regurgitation is trivial.   Cath and PCI 12/23/2018:  Prox - mid RCA-2 lesion is 100% stenosed. This was the culprit lesion.  A drug-eluting stent was successfully placed using a STENT SYNERGY DES [id number].  Post intervention, there is a 0% residual stenosis.  Prox RCA-1 lesion is 80% stenosed.  A drug-eluting stent was successfully placed using a STENT SYNERGY DES 3X16, overlapping the first stent.  Post intervention, there is a 0% residual stenosis.  RPDA lesion is 75% stenosed. This was a small vessel. Medical treatment planned.  Mid Cx lesion is 50% stenosed.  Mid LAD lesion is 40% stenosed.  Mid LAD to Dist LAD lesion is 25% stenosed.  The left ventricular systolic function is normal.  LV  end diastolic pressure is normal.  The left ventricular ejection fraction is 55-65% by visual estimate.  There is no aortic valve stenosis.  Tortuosity in right subclavian did not allow stiffer Cordis XBRCA catheter to pass into ascending aorta. Medtronic Launcher catheter was able to pass from right radial approach.   Watch in 2H.   Continue aggressive secondary prevention.  He needs weight loss and DM control, high dose statin and beta blocker.   He is interested in learning more about exercise and healthy diet.  Recommend cardiac rehab.   DAPT for 1 year given ACS.       Zannie Cove [facility]/Anesthesiology Phone [phone] 05/31/2020 11:10 AM

## 2020-05-31 NOTE — Anesthesia Preprocedure Evaluation (Addendum)
Anesthesia Evaluation  Patient identified by MRN, date of birth, ID band Patient awake    Reviewed: Allergy & Precautions, NPO status , Patient's Chart, lab work & pertinent test results  History of Anesthesia Complications Negative for: history of anesthetic complications  Airway Mallampati: III  TM Distance: >3 FB Neck ROM: Full    Dental no notable dental hx. (+) Dental Advisory Given   Pulmonary neg pulmonary ROS,    Pulmonary exam normal        Cardiovascular hypertension, Pt. on home beta blockers + Past MI and + Cardiac Stents  Normal cardiovascular exam  Dr. Tresa Endo commented on these results and his office visit note 05/06/2020 stating, " I personally reviewed his nuclear perfusion study from October 2021 and his most recent study of March 2022.  The most recent study, there is imaging shown from a more apical portion which shows an apical defect consistent with scar/ischemia more prominent on the present 1 but this image was not on the prior patient.  The present study argues against significant restenosis in the RCA stented segment and perfusion is otherwise fairly normal.  There is mild basal thinning but I suspect this potentially may be contributed by some attenuation. "   Neuro/Psych negative neurological ROS  negative psych ROS   GI/Hepatic negative GI ROS, Neg liver ROS,   Endo/Other  diabetes  Renal/GU negative Renal ROS     Musculoskeletal negative musculoskeletal ROS (+)   Abdominal   Peds  Hematology negative hematology ROS (+)   Anesthesia Other Findings   Reproductive/Obstetrics                           Anesthesia Physical Anesthesia Plan  ASA: III  Anesthesia Plan: General   Post-op Pain Management:  Regional for Post-op pain   Induction: Intravenous  PONV Risk Score and Plan: 2 and Ondansetron and Midazolam  Airway Management Planned: Oral ETT  Additional  Equipment:   Intra-op Plan:   Post-operative Plan: Extubation in OR  Informed Consent: I have reviewed the patients History and Physical, chart, labs and discussed the procedure including the risks, benefits and alternatives for the proposed anesthesia with the patient or authorized representative who has indicated his/her understanding and acceptance.     Dental advisory given  Plan Discussed with: Anesthesiologist and CRNA  Anesthesia Plan Comments: (PAT note by Antionette Poles, PA-C: Follows with cardiology for history of CAD s/p MI treated with DES x2 to RCA 12/23/2018.  Last seen by Dr. Tresa Endo 05/06/2020 and cleared for surgery at that time.  Per note, " He is on aspirin and Brilinta.  Based on his current assessment, I will give him clearance to undergo shoulder surgery.  He will need to hold his Brilinta for a minimum of 5 days prior to surgery."  Dr. Landry Dyke note also states concern for OSA; patient has not had a sleep study.  IDDM 2, last A1c 8.9 on 03/21/2020.  Will need day of surgery labs and evaluation.  EKG 05/06/2020: NSR.  Rate 82.  Possible inferior infarct, age-indeterminate.  Nuclear stress 04/26/2020: Nuclear stress EF: 42%. The left ventricular ejection fraction is moderately decreased (30-44%). There was no ST segment deviation noted during stress. Defect 1: There is a medium defect of moderate severity present in the basal anteroseptal and basal inferoseptal location. Findings consistent with prior myocardial infarction with peri-infarct ischemia. This is an intermediate risk study.   There is a defect in  the basal septum that worsens with stress. This is also consistent with an area of dyskinesis. Suggestive of infarct with peri-infarct ischemia. TID elevated, cannot exclude global ischemia. This is a change since prior study. Prior images personally reviewed.   Dr. Tresa Endo commented on these results and his office visit note 05/06/2020 stating, " I personally reviewed  his nuclear perfusion study from October 2021 and his most recent study of March 2022.  The most recent study, there is imaging shown from a more apical portion which shows an apical defect consistent with scar/ischemia more prominent on the present 1 but this image was not on the prior patient.  The present study argues against significant restenosis in the RCA stented segment and perfusion is otherwise fairly normal.  There is mild basal thinning but I suspect this potentially may be contributed by some attenuation. "   TTE 12/23/2018: 1. Left ventricular ejection fraction, by visual estimation, is 55 to  60%. The left ventricle has normal function. There is moderately increased  left ventricular hypertrophy. Basal inferior hypokinesis.  2. Global right ventricle has mildly reduced systolic function.The right  ventricular size is normal. No increase in right ventricular wall  thickness.  3. Left atrial size was normal.  4. Right atrial size was normal.  5. The mitral valve is normal in structure. No evidence of mitral valve  regurgitation.  6. The tricuspid valve is normal in structure. Tricuspid valve  regurgitation is trivial.  7. The aortic valve is tricuspid. Aortic valve regurgitation is not  visualized.  8. The pulmonic valve was not well visualized. Pulmonic valve  regurgitation is trivial.   Cath and PCI 12/23/2018: Prox - mid RCA-2 lesion is 100% stenosed. This was the culprit lesion. A drug-eluting stent was successfully placed using a STENT SYNERGY DES [id number]. Post intervention, there is a 0% residual stenosis. Prox RCA-1 lesion is 80% stenosed. A drug-eluting stent was successfully placed using a STENT SYNERGY DES 3X16, overlapping the first stent. Post intervention, there is a 0% residual stenosis. RPDA lesion is 75% stenosed. This was a small vessel. Medical treatment planned. Mid Cx lesion is 50% stenosed. Mid LAD lesion is 40% stenosed. Mid LAD to Dist LAD  lesion is 25% stenosed. The left ventricular systolic function is normal. LV end diastolic pressure is normal. The left ventricular ejection fraction is 55-65% by visual estimate. There is no aortic valve stenosis. Tortuosity in right subclavian did not allow stiffer Cordis XBRCA catheter to pass into ascending aorta. Medtronic Launcher catheter was able to pass from right radial approach.   Watch in 2H.  Continue aggressive secondary prevention.  He needs weight loss and DM control, high dose statin and beta blocker.   He is interested in learning more about exercise and healthy diet.  Recommend cardiac rehab.   DAPT for 1 year given ACS.    )      Anesthesia Quick Evaluation

## 2020-05-31 NOTE — Progress Notes (Signed)
Spoke with patient and daughter Kirk Ruths via speaker phone.  Patient denies shortness of breath, fever, cough or chest pain.  PCP - Dr Lenor Coffin Cardiologist - Dr Nicki Guadalajara  Chest x-ray - n/a EKG - 05/06/20 Stress Test - 04/26/20 ECHO - 12/23/18 Cardiac Cath - 12/23/18  Fasting Blood Sugar - unknown Checks Blood Sugar 0 times a day  . Do not take farxiga and metformin on the morning of surgery.  . THE MORNING OF SURGERY, take 20 Units Tresiba Insulin.  . The day of surgery, do not take other diabetes injectables, including Trulicity (dulaglutide).  Blood Thinner Instructions:  Follow your surgeon's instructions on when to stop Brilinta prior to surgery.  If no instructions were given by your surgeon then you will need to call the office for those instructions.  Aspirin Instructions: Follow your surgeon's instructions on when to stop aspirin prior to surgery,  If no instructions were given by your surgeon then you will need to call the office for those instructions.  ERAS: Clear liquids til 5:30 AM DOS  Anesthesia review: Yes  STOP now taking any Aspirin (unless otherwise instructed by your surgeon), Aleve, Naproxen, Ibuprofen, Motrin, Advil, Goody's, BC's, all herbal medications, fish oil, and all vitamins.   Coronavirus Screening Covid test on 05/30/20 was negative.  Patient and daughter verbalized understanding of instructions that were given via phone.

## 2020-06-03 ENCOUNTER — Ambulatory Visit (HOSPITAL_COMMUNITY)
Admission: RE | Admit: 2020-06-03 | Discharge: 2020-06-03 | Disposition: A | Discharge status: Home / Self Care | Payer: 59 | Attending: Orthopedic Surgery | Admitting: Orthopedic Surgery

## 2020-06-03 ENCOUNTER — Ambulatory Visit (HOSPITAL_COMMUNITY): Payer: 59 | Admitting: Physician Assistant

## 2020-06-03 ENCOUNTER — Encounter (HOSPITAL_COMMUNITY)
Admission: RE | Disposition: A | Discharge status: Home / Self Care | Payer: Self-pay | Source: Home / Self Care | Attending: Orthopedic Surgery

## 2020-06-03 ENCOUNTER — Other Ambulatory Visit: Payer: Self-pay

## 2020-06-03 ENCOUNTER — Telehealth: Payer: Self-pay

## 2020-06-03 ENCOUNTER — Encounter (HOSPITAL_COMMUNITY): Payer: Self-pay | Admitting: Orthopedic Surgery

## 2020-06-03 DIAGNOSIS — S43003A Unspecified subluxation of unspecified shoulder joint, initial encounter: Secondary | ICD-10-CM

## 2020-06-03 DIAGNOSIS — M75121 Complete rotator cuff tear or rupture of right shoulder, not specified as traumatic: Secondary | ICD-10-CM

## 2020-06-03 DIAGNOSIS — Z7982 Long term (current) use of aspirin: Secondary | ICD-10-CM | POA: Insufficient documentation

## 2020-06-03 DIAGNOSIS — M75101 Unspecified rotator cuff tear or rupture of right shoulder, not specified as traumatic: Secondary | ICD-10-CM | POA: Diagnosis not present

## 2020-06-03 DIAGNOSIS — S43431A Superior glenoid labrum lesion of right shoulder, initial encounter: Secondary | ICD-10-CM | POA: Diagnosis not present

## 2020-06-03 DIAGNOSIS — Z794 Long term (current) use of insulin: Secondary | ICD-10-CM | POA: Insufficient documentation

## 2020-06-03 DIAGNOSIS — S46119A Strain of muscle, fascia and tendon of long head of biceps, unspecified arm, initial encounter: Secondary | ICD-10-CM

## 2020-06-03 DIAGNOSIS — Z79899 Other long term (current) drug therapy: Secondary | ICD-10-CM | POA: Insufficient documentation

## 2020-06-03 DIAGNOSIS — I2510 Atherosclerotic heart disease of native coronary artery without angina pectoris: Secondary | ICD-10-CM | POA: Insufficient documentation

## 2020-06-03 DIAGNOSIS — M94211 Chondromalacia, right shoulder: Secondary | ICD-10-CM | POA: Insufficient documentation

## 2020-06-03 DIAGNOSIS — S46111A Strain of muscle, fascia and tendon of long head of biceps, right arm, initial encounter: Secondary | ICD-10-CM

## 2020-06-03 DIAGNOSIS — S43081A Other subluxation of right shoulder joint, initial encounter: Secondary | ICD-10-CM | POA: Insufficient documentation

## 2020-06-03 DIAGNOSIS — Z955 Presence of coronary angioplasty implant and graft: Secondary | ICD-10-CM | POA: Diagnosis not present

## 2020-06-03 DIAGNOSIS — Z7984 Long term (current) use of oral hypoglycemic drugs: Secondary | ICD-10-CM | POA: Diagnosis not present

## 2020-06-03 DIAGNOSIS — E119 Type 2 diabetes mellitus without complications: Secondary | ICD-10-CM | POA: Diagnosis not present

## 2020-06-03 DIAGNOSIS — X58XXXA Exposure to other specified factors, initial encounter: Secondary | ICD-10-CM | POA: Diagnosis not present

## 2020-06-03 DIAGNOSIS — I252 Old myocardial infarction: Secondary | ICD-10-CM | POA: Insufficient documentation

## 2020-06-03 DIAGNOSIS — Z7902 Long term (current) use of antithrombotics/antiplatelets: Secondary | ICD-10-CM | POA: Insufficient documentation

## 2020-06-03 HISTORY — DX: Acute myocardial infarction, unspecified: I21.9

## 2020-06-03 HISTORY — PX: SHOULDER ARTHROSCOPY WITH OPEN ROTATOR CUFF REPAIR AND DISTAL CLAVICLE ACROMINECTOMY: SHX5683

## 2020-06-03 LAB — CBC
HCT: 45.4 % (ref 39.0–52.0)
Hemoglobin: 14.3 g/dL (ref 13.0–17.0)
MCH: 28.1 pg (ref 26.0–34.0)
MCHC: 31.5 g/dL (ref 30.0–36.0)
MCV: 89.2 fL (ref 80.0–100.0)
Platelets: 306 10*3/uL (ref 150–400)
RBC: 5.09 MIL/uL (ref 4.22–5.81)
RDW: 14.8 % (ref 11.5–15.5)
WBC: 6 10*3/uL (ref 4.0–10.5)
nRBC: 0 % (ref 0.0–0.2)

## 2020-06-03 LAB — BASIC METABOLIC PANEL WITH GFR
Anion gap: 7 (ref 5–15)
BUN: 16 mg/dL (ref 8–23)
CO2: 24 mmol/L (ref 22–32)
Calcium: 9.5 mg/dL (ref 8.9–10.3)
Chloride: 108 mmol/L (ref 98–111)
Creatinine, Ser: 1.03 mg/dL (ref 0.61–1.24)
GFR, Estimated: >60 mL/min
Glucose, Bld: 123 mg/dL — ABNORMAL HIGH (ref 70–99)
Potassium: 4.5 mmol/L (ref 3.5–5.1)
Sodium: 139 mmol/L (ref 135–145)

## 2020-06-03 LAB — GLUCOSE, CAPILLARY: Glucose-Capillary: 140 mg/dL — ABNORMAL HIGH (ref 70–99)

## 2020-06-03 SURGERY — SHOULDER ARTHROSCOPY WITH OPEN ROTATOR CUFF REPAIR AND DISTAL CLAVICLE ACROMINECTOMY
Anesthesia: General | Site: Shoulder | Laterality: Right

## 2020-06-03 MED ORDER — POVIDONE-IODINE 7.5 % EX SOLN: Freq: Once | CUTANEOUS | Status: DC

## 2020-06-03 MED ORDER — CHLORHEXIDINE GLUCONATE 0.12 % MT SOLN
OROMUCOSAL | Status: AC
  Filled 2020-06-03: qty 15

## 2020-06-03 MED ORDER — FENTANYL CITRATE (PF) 250 MCG/5ML IJ SOLN
INTRAMUSCULAR | Status: AC
  Filled 2020-06-03: qty 5

## 2020-06-03 MED ORDER — VANCOMYCIN HCL 1000 MG IV SOLR
INTRAVENOUS | Status: DC | PRN
  Administered 2020-06-03: 1 g

## 2020-06-03 MED ORDER — ACETAMINOPHEN 500 MG PO TABS
1000.0000 mg | ORAL_TABLET | Freq: Once | ORAL | Status: AC
  Administered 2020-06-03: 1000 mg via ORAL
  Filled 2020-06-03: qty 2

## 2020-06-03 MED ORDER — DEXAMETHASONE SODIUM PHOSPHATE 10 MG/ML IJ SOLN
INTRAMUSCULAR | Status: DC | PRN
  Administered 2020-06-03: 10 mg via INTRAVENOUS

## 2020-06-03 MED ORDER — TRANEXAMIC ACID-NACL 1000-0.7 MG/100ML-% IV SOLN
INTRAVENOUS | Status: DC | PRN
  Administered 2020-06-03: 1000 mg via INTRAVENOUS

## 2020-06-03 MED ORDER — DEXAMETHASONE SODIUM PHOSPHATE 10 MG/ML IJ SOLN
INTRAMUSCULAR | Status: AC
  Filled 2020-06-03: qty 1

## 2020-06-03 MED ORDER — KETOROLAC TROMETHAMINE 10 MG PO TABS: 10.0000 mg | ORAL_TABLET | Freq: Three times a day (TID) | ORAL | 0 refills | Status: DC | PRN

## 2020-06-03 MED ORDER — MIDAZOLAM HCL 2 MG/2ML IJ SOLN
INTRAMUSCULAR | Status: AC
  Filled 2020-06-03: qty 2

## 2020-06-03 MED ORDER — PHENYLEPHRINE 40 MCG/ML (10ML) SYRINGE FOR IV PUSH (FOR BLOOD PRESSURE SUPPORT)
PREFILLED_SYRINGE | INTRAVENOUS | Status: DC | PRN
  Administered 2020-06-03 (×2): 80 ug via INTRAVENOUS
  Administered 2020-06-03 (×2): 120 ug via INTRAVENOUS

## 2020-06-03 MED ORDER — VANCOMYCIN HCL 1000 MG IV SOLR
INTRAVENOUS | Status: AC
  Filled 2020-06-03: qty 1000

## 2020-06-03 MED ORDER — ALBUMIN HUMAN 5 % IV SOLN: INTRAVENOUS | Status: DC | PRN

## 2020-06-03 MED ORDER — SUCCINYLCHOLINE CHLORIDE 200 MG/10ML IV SOSY
PREFILLED_SYRINGE | INTRAVENOUS | Status: DC | PRN
  Administered 2020-06-03: 160 mg via INTRAVENOUS

## 2020-06-03 MED ORDER — OXYCODONE HCL 5 MG PO TABS: 5.0000 mg | ORAL_TABLET | ORAL | 0 refills | Status: DC | PRN

## 2020-06-03 MED ORDER — PHENYLEPHRINE HCL-NACL 10-0.9 MG/250ML-% IV SOLN
INTRAVENOUS | Status: DC | PRN
  Administered 2020-06-03: 25 ug/min via INTRAVENOUS

## 2020-06-03 MED ORDER — FENTANYL CITRATE (PF) 100 MCG/2ML IJ SOLN: 25.0000 ug | INTRAMUSCULAR | Status: DC | PRN

## 2020-06-03 MED ORDER — EPINEPHRINE PF 1 MG/ML IJ SOLN
INTRAMUSCULAR | Status: AC
  Filled 2020-06-03: qty 1

## 2020-06-03 MED ORDER — ONDANSETRON HCL 4 MG/2ML IJ SOLN
INTRAMUSCULAR | Status: DC | PRN
  Administered 2020-06-03: 4 mg via INTRAVENOUS

## 2020-06-03 MED ORDER — PHENYLEPHRINE 40 MCG/ML (10ML) SYRINGE FOR IV PUSH (FOR BLOOD PRESSURE SUPPORT)
PREFILLED_SYRINGE | INTRAVENOUS | Status: AC
  Filled 2020-06-03: qty 10

## 2020-06-03 MED ORDER — LACTATED RINGERS IV SOLN: INTRAVENOUS | Status: DC | PRN

## 2020-06-03 MED ORDER — BUPIVACAINE HCL (PF) 0.25 % IJ SOLN
INTRAMUSCULAR | Status: AC
  Filled 2020-06-03: qty 30

## 2020-06-03 MED ORDER — SUCCINYLCHOLINE CHLORIDE 200 MG/10ML IV SOSY
PREFILLED_SYRINGE | INTRAVENOUS | Status: AC
  Filled 2020-06-03: qty 10

## 2020-06-03 MED ORDER — FENTANYL CITRATE (PF) 250 MCG/5ML IJ SOLN
INTRAMUSCULAR | Status: DC | PRN
  Administered 2020-06-03: 50 ug via INTRAVENOUS
  Administered 2020-06-03: 100 ug via INTRAVENOUS

## 2020-06-03 MED ORDER — MIDAZOLAM HCL 2 MG/2ML IJ SOLN
INTRAMUSCULAR | Status: DC | PRN
  Administered 2020-06-03: 2 mg via INTRAVENOUS

## 2020-06-03 MED ORDER — FENTANYL CITRATE (PF) 100 MCG/2ML IJ SOLN
INTRAMUSCULAR | Status: AC
  Filled 2020-06-03: qty 2

## 2020-06-03 MED ORDER — PHENYLEPHRINE HCL (PRESSORS) 10 MG/ML IV SOLN
INTRAVENOUS | Status: AC
  Filled 2020-06-03: qty 1

## 2020-06-03 MED ORDER — LIDOCAINE 2% (20 MG/ML) 5 ML SYRINGE
INTRAMUSCULAR | Status: AC
  Filled 2020-06-03: qty 5

## 2020-06-03 MED ORDER — TRANEXAMIC ACID-NACL 1000-0.7 MG/100ML-% IV SOLN
INTRAVENOUS | Status: AC
  Filled 2020-06-03: qty 100

## 2020-06-03 MED ORDER — PROPOFOL 10 MG/ML IV BOLUS
INTRAVENOUS | Status: AC
  Filled 2020-06-03: qty 40

## 2020-06-03 MED ORDER — PROPOFOL 10 MG/ML IV BOLUS
INTRAVENOUS | Status: DC | PRN
  Administered 2020-06-03: 200 mg via INTRAVENOUS

## 2020-06-03 MED ORDER — CEFAZOLIN SODIUM-DEXTROSE 2-4 GM/100ML-% IV SOLN
2.0000 g | INTRAVENOUS | Status: AC
  Administered 2020-06-03: 2 g via INTRAVENOUS
  Filled 2020-06-03: qty 100

## 2020-06-03 MED ORDER — BUPIVACAINE HCL (PF) 0.5 % IJ SOLN
INTRAMUSCULAR | Status: DC | PRN
  Administered 2020-06-03: 15 mL via PERINEURAL

## 2020-06-03 MED ORDER — HYDROMORPHONE HCL 2 MG PO TABS: 2.0000 mg | ORAL_TABLET | ORAL | 0 refills | Status: AC | PRN

## 2020-06-03 MED ORDER — ONDANSETRON HCL 4 MG/2ML IJ SOLN
INTRAMUSCULAR | Status: AC
  Filled 2020-06-03: qty 2

## 2020-06-03 MED ORDER — BUPIVACAINE LIPOSOME 1.3 % IJ SUSP
INTRAMUSCULAR | Status: DC | PRN
  Administered 2020-06-03: 10 mL via PERINEURAL

## 2020-06-03 MED ORDER — POVIDONE-IODINE 10 % EX SWAB: 2.0000 "application " | Freq: Once | CUTANEOUS | Status: DC

## 2020-06-03 MED ORDER — METHOCARBAMOL 500 MG PO TABS: 500.0000 mg | ORAL_TABLET | Freq: Three times a day (TID) | ORAL | 0 refills | Status: DC | PRN

## 2020-06-03 MED ORDER — EPINEPHRINE PF 1 MG/ML IJ SOLN
INTRAMUSCULAR | Status: AC
  Filled 2020-06-03: qty 3

## 2020-06-03 MED ORDER — CELECOXIB 200 MG PO CAPS
200.0000 mg | ORAL_CAPSULE | Freq: Once | ORAL | Status: AC
  Administered 2020-06-03: 200 mg via ORAL
  Filled 2020-06-03: qty 1

## 2020-06-03 SURGICAL SUPPLY — 67 items
ANCHOR FBRTK 2.6 SUTURETAP 1.3 (Anchor) ×4 IMPLANT
ANCHOR SL BIO 4.75 W/FIBERTAPE (Anchor) ×4 IMPLANT
ANCHOR SUT 1.8 FBRTK KNTLS 2SU (Anchor) ×4 IMPLANT
ANCHOR SWIVELOCK BIO 4.75X19.1 (Anchor) ×2 IMPLANT
BLADE EXCALIBUR 4.0X13 (MISCELLANEOUS) IMPLANT
BLADE SURG 11 STRL SS (BLADE) ×2 IMPLANT
BUR OVAL 6.0 (BURR) IMPLANT
COVER SURGICAL LIGHT HANDLE (MISCELLANEOUS) ×2 IMPLANT
COVER WAND RF STERILE (DRAPES) ×2 IMPLANT
DRAPE INCISE IOBAN 66X45 STRL (DRAPES) ×4 IMPLANT
DRAPE STERI 35X30 U-POUCH (DRAPES) ×2 IMPLANT
DRAPE U-SHAPE 47X51 STRL (DRAPES) ×4 IMPLANT
DRSG PAD ABDOMINAL 8X10 ST (GAUZE/BANDAGES/DRESSINGS) ×6 IMPLANT
DRSG TEGADERM 4X4.75 (GAUZE/BANDAGES/DRESSINGS) ×8 IMPLANT
DURAPREP 26ML APPLICATOR (WOUND CARE) ×2 IMPLANT
ELECT REM PT RETURN 9FT ADLT (ELECTROSURGICAL) ×2
ELECTRODE REM PT RTRN 9FT ADLT (ELECTROSURGICAL) ×1 IMPLANT
FILTER STRAW FLUID ASPIR (MISCELLANEOUS) ×2 IMPLANT
GAUZE SPONGE 4X4 12PLY STRL LF (GAUZE/BANDAGES/DRESSINGS) ×2 IMPLANT
GAUZE XEROFORM 1X8 LF (GAUZE/BANDAGES/DRESSINGS) ×2 IMPLANT
GAUZE XEROFORM 5X9 LF (GAUZE/BANDAGES/DRESSINGS) ×2 IMPLANT
GLOVE ECLIPSE 8.0 STRL XLNG CF (GLOVE) ×2 IMPLANT
GLOVE SRG 8 PF TXTR STRL LF DI (GLOVE) ×1 IMPLANT
GLOVE SURG UNDER POLY LF SZ8 (GLOVE) ×2
GOWN STRL REUS W/ TWL LRG LVL3 (GOWN DISPOSABLE) ×3 IMPLANT
GOWN STRL REUS W/TWL LRG LVL3 (GOWN DISPOSABLE) ×6
KIT BASIN OR (CUSTOM PROCEDURE TRAY) ×2 IMPLANT
KIT STR SPEAR 1.8 FBRTK DISP (KITS) ×2 IMPLANT
KIT TURNOVER KIT B (KITS) ×2 IMPLANT
MANIFOLD NEPTUNE II (INSTRUMENTS) ×2 IMPLANT
NDL SUT 6 .5 CRC .975X.05 MAYO (NEEDLE) IMPLANT
NEEDLE HYPO 25X1 1.5 SAFETY (NEEDLE) ×2 IMPLANT
NEEDLE MAYO TAPER (NEEDLE)
NEEDLE SCORPION MULTI FIRE (NEEDLE) ×2 IMPLANT
NEEDLE SPNL 18GX3.5 QUINCKE PK (NEEDLE) ×2 IMPLANT
NEEDLE TAPERED W/ NITINOL LOOP (MISCELLANEOUS) ×2 IMPLANT
NS IRRIG 1000ML POUR BTL (IV SOLUTION) ×2 IMPLANT
PACK SHOULDER (CUSTOM PROCEDURE TRAY) ×2 IMPLANT
PAD ARMBOARD 7.5X6 YLW CONV (MISCELLANEOUS) ×4 IMPLANT
PORT APPOLLO RF 90DEGREE MULTI (SURGICAL WAND) ×2 IMPLANT
RESTRAINT HEAD UNIVERSAL NS (MISCELLANEOUS) ×2 IMPLANT
SLING ARM IMMOBILIZER MED (SOFTGOODS) IMPLANT
SPONGE LAP 4X18 RFD (DISPOSABLE) ×4 IMPLANT
STRIP CLOSURE SKIN 1/2X4 (GAUZE/BANDAGES/DRESSINGS) ×4 IMPLANT
SUCTION FRAZIER HANDLE 10FR (MISCELLANEOUS) ×2
SUCTION TUBE FRAZIER 10FR DISP (MISCELLANEOUS) ×1 IMPLANT
SUT ETHILON 3 0 PS 1 (SUTURE) ×2 IMPLANT
SUT FIBERWIRE #2 38 T-5 BLUE (SUTURE)
SUT FIBERWIRE 2-0 18 17.9 3/8 (SUTURE) ×18
SUT MNCRL AB 3-0 PS2 18 (SUTURE) ×2 IMPLANT
SUT VIC AB 0 CT1 27 (SUTURE) ×4
SUT VIC AB 0 CT1 27XBRD ANBCTR (SUTURE) ×2 IMPLANT
SUT VIC AB 1 CT1 27 (SUTURE) ×12
SUT VIC AB 1 CT1 27XBRD ANBCTR (SUTURE) ×6 IMPLANT
SUT VIC AB 2-0 CT1 27 (SUTURE) ×4
SUT VIC AB 2-0 CT1 36 (SUTURE) ×4 IMPLANT
SUT VIC AB 2-0 CT1 TAPERPNT 27 (SUTURE) ×2 IMPLANT
SUT VICRYL 0 UR6 27IN ABS (SUTURE) ×6 IMPLANT
SUTURE FIBERWR #2 38 T-5 BLUE (SUTURE) IMPLANT
SUTURE FIBERWR 2-0 18 17.9 3/8 (SUTURE) ×9 IMPLANT
SYR 20ML LL LF (SYRINGE) ×4 IMPLANT
SYR 3ML LL SCALE MARK (SYRINGE) ×2 IMPLANT
SYR TB 1ML LUER SLIP (SYRINGE) ×2 IMPLANT
TOWEL GREEN STERILE (TOWEL DISPOSABLE) ×2 IMPLANT
TOWEL GREEN STERILE FF (TOWEL DISPOSABLE) ×2 IMPLANT
TUBING ARTHROSCOPY IRRIG 16FT (MISCELLANEOUS) ×2 IMPLANT
WATER STERILE IRR 1000ML POUR (IV SOLUTION) ×2 IMPLANT

## 2020-06-03 NOTE — Anesthesia Postprocedure Evaluation (Signed)
Anesthesia Post Note  Patient: Keith Newton  Procedure(s) Performed: right shoulder arthroscopy, biceps tenodesis, mini open rotator cuff tear repair subscap/supraspinatus, lateral debridement (Right Shoulder)     Patient location during evaluation: PACU Anesthesia Type: General Level of consciousness: sedated Pain management: pain level controlled Vital Signs Assessment: post-procedure vital signs reviewed and stable Respiratory status: spontaneous breathing and respiratory function stable Cardiovascular status: stable Postop Assessment: no apparent nausea or vomiting Anesthetic complications: no   No complications documented.  Last Vitals:  Vitals:   06/03/20 1215 06/03/20 1229  BP: 105/68 113/69  Pulse: 80 77  Resp: 18 19  Temp:  36.7 C  SpO2: 92% 92%    Last Pain:  Vitals:   06/03/20 1229  TempSrc:   PainSc: 0-No pain                 Jaida Basurto DANIEL

## 2020-06-03 NOTE — Brief Op Note (Signed)
   06/03/2020  10:50 AM  PATIENT:  Keith Newton  65 y.o. male  PRE-OPERATIVE DIAGNOSIS:  right shoulder rotator cuff tear of supraspinatus and upper subscapularis with, biceps subluxation  POST-OPERATIVE DIAGNOSIS:  right shoulder rotator cuff tear of supraspinatus and upper subscapularis, biceps subluxation  PROCEDURE:  Procedure(s): right shoulder arthroscopy, extensive debridement of superior labral degenerative tearing along with glenohumeral joint arthritis primarily on the humeral head with mini open, biceps tenodesis, mini open rotator cuff tear repair subscap/supraspinatus,  SURGEON:  Surgeon(s): August Saucer, Corrie Mckusick, MD  ASSISTANT: Karenann Cai, PA  ANESTHESIA:   general  EBL: 10 ml    Total I/O In: 1800 [I.V.:1100; IV Piggyback:700] Out: 50 [Blood:50]  BLOOD ADMINISTERED: none  DRAINS: none   LOCAL MEDICATIONS USED:  none  SPECIMEN:  No Specimen  COUNTS:  YES  TOURNIQUET:  * No tourniquets in log *  DICTATION: .Other Dictation: Dictation Number [id number]  PLAN OF CARE: Discharge to home after PACU  PATIENT DISPOSITION:  PACU - hemodynamically stable

## 2020-06-03 NOTE — Anesthesia Procedure Notes (Signed)
Anesthesia Regional Block: Interscalene brachial plexus block   Pre-Anesthetic Checklist: ,, timeout performed, Correct Patient, Correct Site, Correct Laterality, Correct Procedure, Correct Position, site marked, Risks and benefits discussed,  Surgical consent,  Pre-op evaluation,  At surgeon's request and post-op pain management  Laterality: Right  Prep: chloraprep       Needles:  Injection technique: Single-shot  Needle Type: Echogenic Stimulator Needle     Needle Length: 5cm  Needle Gauge: 22     Additional Needles:   Narrative:  Start time: 06/03/2020 7:07 AM End time: 06/03/2020 7:17 AM Injection made incrementally with aspirations every 5 mL.  Performed by: Personally  Anesthesiologist: Heather Roberts, MD  Additional Notes: Functioning IV was confirmed and monitors applied.  A 61mm 22ga echogenic arrow stimulator was used. Sterile prep and drape,hand hygiene and sterile gloves were used.Ultrasound guidance: relevant anatomy identified, needle position confirmed, local anesthetic spread visualized around nerve(s)., vascular puncture avoided.  Image printed for medical record.  Negative aspiration and negative test dose prior to incremental administration of local anesthetic. The patient tolerated the procedure well.

## 2020-06-03 NOTE — Progress Notes (Signed)
This RN called Dr. August Saucer to clarify questions/concerns the patient's family brought up during discharge teaching. Clarification was given that pt maintain the shoulder immobilizer sling, different pain med being sent to pharmacy (one not containing codeine) and for the pt to stop taking his Lyrica while on pain medication.

## 2020-06-03 NOTE — Progress Notes (Signed)
Anesthesia aware of elevated blood pressure.  No new orders received.

## 2020-06-03 NOTE — Transfer of Care (Signed)
Immediate Anesthesia Transfer of Care Note  Patient: Keith Newton  Procedure(s) Performed: right shoulder arthroscopy, biceps tenodesis, mini open rotator cuff tear repair subscap/supraspinatus, lateral debridement (Right Shoulder)  Patient Location: PACU  Anesthesia Type:General  Level of Consciousness: drowsy and patient cooperative  Airway & Oxygen Therapy: Patient Spontanous Breathing  Post-op Assessment: Report given to RN and Post -op Vital signs reviewed and stable  Post vital signs: Reviewed and stable  Last Vitals:  Vitals Value Taken Time  BP 108/70 06/03/20 1113  Temp 36.3 C 06/03/20 1045  Pulse 84 06/03/20 1114  Resp 23 06/03/20 1114  SpO2 90 % 06/03/20 1114  Vitals shown include unvalidated device data.  Last Pain:  Vitals:   06/03/20 1045  TempSrc:   PainSc: Asleep         Complications: No complications documented.

## 2020-06-03 NOTE — Anesthesia Procedure Notes (Signed)
Procedure Name: Intubation Date/Time: 06/03/2020 8:25 AM Performed by: Modena Morrow, CRNA Pre-anesthesia Checklist: Patient identified, Emergency Drugs available, Suction available and Patient being monitored Patient Re-evaluated:Patient Re-evaluated prior to induction Oxygen Delivery Method: Circle system utilized Preoxygenation: Pre-oxygenation with 100% oxygen Induction Type: IV induction Ventilation: Mask ventilation without difficulty Laryngoscope Size: Miller and 3 Grade View: Grade II Tube type: Oral Tube size: 7.5 mm Number of attempts: 1 Airway Equipment and Method: Stylet and Oral airway Placement Confirmation: ETT inserted through vocal cords under direct vision,  positive ETCO2 and breath sounds checked- equal and bilateral Secured at: 22 cm Tube secured with: Tape Dental Injury: Teeth and Oropharynx as per pre-operative assessment

## 2020-06-03 NOTE — Telephone Encounter (Signed)
Keith Newton. Called with approval info regarding  Code:[id number] 09/02/2020 Authorization number is [id number]

## 2020-06-03 NOTE — H&P (Signed)
Keith Newton is an 64 y.o. male.   Chief Complaint: Right shoulder pain HPI: Jc is a 64-year-old patient with right shoulder pain for a long time.  He is right-hand dominant.  He has good and bad days.  Usually it is painful and aching on a daily basis.  Takes Tylenol as needed.  He reports a grinding and pulling sensation in the arm.  His son and wife are at home.  This pain wakes him up at night.  Hard for him to lift the right arm.  He is retired.  Likes to work on cars and trucks and take care of his yard MRI scan of the shoulder is reviewed and shows medial subluxation of the biceps tendon along with tearing of the upper subscap as well as retracted tear of the supraspinatus.  There is also some mild glenohumeral joint arthritis.  Patient does have pain radiating into the biceps region.  Past Medical History:  Diagnosis Date  . Arthritis   . Back pain   . CAD (coronary artery disease)    a. s/p NSTEMI in 12/2018 with 80% Prox-RCA stenosis followed by 100% prox to mid-RCA stenosis --> DESx2 to the RCA. Med management of residual disease.   . Carpal tunnel syndrome   . DDD (degenerative disc disease)   . Diabetes mellitus    type 2  . Hyperlipidemia   . Hypertension    states very mild  . Low testosterone   . Myocardial infarction (HCC)   . Overweight   . Seasonal allergies   . Tachycardia     Past Surgical History:  Procedure Laterality Date  . CARDIAC CATHETERIZATION    . CARPAL TUNNEL RELEASE  07/02/2011   Procedure: CARPAL TUNNEL RELEASE;  Surgeon: Robert V Sypher Jr., MD;  Location: [facility];  Service: Orthopedics;  Laterality: Right;  . CARPAL TUNNEL RELEASE  07/30/2011   Procedure: CARPAL TUNNEL RELEASE;  Surgeon: Robert V Sypher Jr., MD;  Location:  SURGERY CENTER;  Service: Orthopedics;  Laterality: Left;  . CORONARY ANGIOPLASTY    . CORONARY STENT INTERVENTION N/A 12/23/2018   Procedure: CORONARY STENT INTERVENTION;  Surgeon: Varanasi,  Jayadeep S, MD;  Location: [facility];  Service: Cardiovascular;  Laterality: N/A;  . LEFT HEART CATH AND CORONARY ANGIOGRAPHY N/A 12/23/2018   Procedure: LEFT HEART CATH AND CORONARY ANGIOGRAPHY;  Surgeon: Varanasi, Jayadeep S, MD;  Location: [facility];  Service: Cardiovascular;  Laterality: N/A;  . LUMBAR EPIDURAL INJECTION     x2    Family History  Problem Relation Age of Onset  . Stroke Mother   . CAD Father   . CAD Maternal Grandfather   . CAD Paternal Grandfather    Social History:  reports that he has never smoked. He has never used smokeless tobacco. He reports that he does not drink alcohol and does not use drugs.  Allergies:  Allergies  Allergen Reactions  . Codeine Swelling    Lips and facial swelling    Medications Prior to Admission  Medication Sig Dispense Refill  . aspirin EC 81 MG EC tablet Take 1 tablet (81 mg total) by mouth daily.    . atorvastatin (LIPITOR) 80 MG tablet TAKE 1 TABLET BY MOUTH EVERY DAY (Patient taking differently: Take 80 mg by mouth daily.) 90 tablet 3  . BRILINTA 90 MG TABS tablet TAKE 1 TABLET BY MOUTH TWO TIMES DAILY (Patient taking differently: Take 90 mg by mouth 2 (two) times daily.)   180 tablet 3  . dapagliflozin propanediol (FARXIGA) 10 MG TABS tablet Take 1 tablet (10 mg total) by mouth daily. 90 tablet 3  . Dulaglutide (TRULICITY) 0.75 MG/0.5ML SOPN Inject 0.75 mg into the skin once a week. 2 mL 1  . insulin degludec (TRESIBA FLEXTOUCH) 100 UNIT/ML FlexTouch Pen Inject 40 Units into the skin daily. 15 mL 4  . isosorbide mononitrate (IMDUR) 60 MG 24 hr tablet TAKE 1 TABLET BY MOUTH EVERY DAY 90 tablet 3  . metFORMIN (GLUCOPHAGE) 1000 MG tablet TAKE 1 TABLET BY MOUTH TWICE A DAY (Patient taking differently: Take 2,000 mg by mouth 2 (two) times daily with a meal.) 180 tablet 1  . metoprolol tartrate (LOPRESSOR) 25 MG tablet Take 3 tablets (75 mg total) by mouth 2 (two) times daily. 180 tablet 11  . nitroGLYCERIN (NITROSTAT)  0.4 MG SL tablet Place 1 tablet (0.4 mg total) under the tongue every 5 (five) minutes x 3 doses as needed for chest pain. 25 tablet 3  . pregabalin (LYRICA) 75 MG capsule Take 1 capsule (75 mg total) by mouth 3 (three) times daily. 90 capsule 1  . acetaminophen (TYLENOL) 325 MG tablet Take 650 mg by mouth every 6 (six) hours as needed.    . Baclofen 5 MG TABS Take 5 mg by mouth at bedtime as needed. (Patient not taking: Reported on 05/27/2020) 30 tablet 1  . Blood Glucose Monitoring Suppl (ONETOUCH VERIO IQ SYSTEM) w/Device KIT Check fasting blood glucose in AM and before giving insulin. 1 kit 1  . Elastic Bandages & Supports (MEDICAL COMPRESSION STOCKINGS) MISC Wear daily 2 each 1  . glucose blood (ONETOUCH VERIO) test strip Use as instructed 100 each 12  . HYDROcodone-acetaminophen (NORCO) 5-325 MG tablet Take 1 tablet by mouth 3 (three) times daily as needed. (Patient not taking: Reported on 05/27/2020) 30 tablet 0  . Insulin Pen Needle 32G X 4 MM MISC Inject into the skin daily.    . Lancet Devices (ONE TOUCH DELICA LANCING DEV) MISC Check fasting blood glucose in AM and before giving insulin. 1 each 11  . methocarbamol (ROBAXIN) 500 MG tablet Take 1 tablet (500 mg total) by mouth 2 (two) times daily as needed. (Patient not taking: Reported on 05/27/2020) 20 tablet 0  . OneTouch Delica Lancets 33G MISC Check fasting blood glucose in AM and before giving insulin. 100 each 11  . traMADol (ULTRAM) 50 MG tablet Take 1 tablet (50 mg total) by mouth 3 (three) times daily as needed. (Patient not taking: Reported on 05/27/2020) 30 tablet 0  . traMADol (ULTRAM) 50 MG tablet Take 1-2 tablets (50-100 mg total) by mouth daily as needed. (Patient not taking: Reported on 05/27/2020) 20 tablet 0    No results found for this or any previous visit (from the past 48 hour(s)). No results found.  Review of Systems  Musculoskeletal: Positive for arthralgias.  All other systems reviewed and are negative.   Blood  pressure (!) 188/125, pulse 93, temperature (!) 97.5 F (36.4 C), temperature source Oral, resp. rate 18, height 5' 8" (1.727 m), weight 104.8 kg, SpO2 95 %. Physical Exam Vitals reviewed.  HENT:     Head: Normocephalic.     Nose: Nose normal.     Mouth/Throat:     Mouth: Mucous membranes are moist.  Eyes:     Pupils: Pupils are equal, round, and reactive to light.  Cardiovascular:     Rate and Rhythm: Normal rate.     Pulses: Normal   pulses.  Pulmonary:     Effort: Pulmonary effort is normal.  Abdominal:     General: Abdomen is flat.  Musculoskeletal:     Cervical back: Normal range of motion.  Skin:    General: Skin is warm.     Capillary Refill: Capillary refill takes less than 2 seconds.  Neurological:     General: No focal deficit present.     Mental Status: He is alert.  Psychiatric:        Mood and Affect: Mood normal.    Ortho exam demonstrates good cervical spine range of motion with intact motor sensory function to the hand.  Radial pulse was intact on the right-hand side.  Patient does have coarse grinding and crepitus with active and passive range of motion of that right arm above 90 degrees.  Has 5- out of 5 weakness to subscap testing on the right versus 5+ out of 5 on the left.  Passive range of motion of the shoulder is above 90 degrees of forward flexion and abduction.  External rotation at 15 degrees of abduction on the right is to about 45 degrees bilaterally.  Supraspinatus weakness is present but infraspinatus weakness is not present on the right-hand side.  No discrete AC joint tenderness to direct palpation    patient does have tenderness of the biceps tendon in the bicipital groove along with positive Speed testing.   Assessment/Plan  Impression is right shoulder pain with full-thickness retracted tear of the supraspinatus tendon and the subscap tendon superiorly.  Medial subluxation of the biceps tendon with subtle SLAP tear present.  These are all  significant pain generators in the shoulder.  MRI scan report is reviewed but I do not actually have access to the images.  Discussed with the patient options for treatment.  That would include arthroscopy with biceps tendon release debridement and mini open rotator cuff tear repair which I think would offer him a pretty good chance at pain relief and increased function.  The risk and benefits of that procedure are discussed with the patient including not limited to infection nerve vessel damage shoulder stiffness as well as the prolonged intensive rehabilitative effort required to regain a functional shoulder.  Patient understands risk benefits.  I would like to use a passive shoulder range of motion device postoperatively.  Patient understands risk benefits and wishes to proceed.  He does have some medical comorbidities which will need to be evaluated in a risk stratification type manner prior to surgery.  All questions answered Would try to avoid shoulder replacement in this patient at this time.  Based on his age of 35 reverse replacement would not necessarily be his best option.  I think that with the medial subluxation of the biceps and upper subscap tear that if we can restore the anterior posterior force couple now would be his best initial bet.  Supraspinatus may be able to be mobilized back for repair as well.  Patient does have clinical and radiographic symptoms consistent with biceps tendinitis as well and therefore SLAP tear debridement and biceps tenodesis will be required. Anderson Malta, MD 06/03/2020, 7:06 AM

## 2020-06-04 ENCOUNTER — Encounter (HOSPITAL_COMMUNITY): Payer: Self-pay | Admitting: Orthopedic Surgery

## 2020-06-04 NOTE — Op Note (Signed)
NAMERICHARDO, POPOFF MEDICAL RECORD NO: [id number] ACCOUNT NO: [id number] DATE OF BIRTH: 11/03/1955 FACILITY: [facility] LOCATION: [facility] PHYSICIAN: Graylin Shiver. Jakyri Brunkhorst, MD  Operative Report   DATE OF PROCEDURE: 06/03/2020  PREOPERATIVE DIAGNOSIS:  Right shoulder rotator cuff tear, supraspinatus and upper subscapularis, with biceps tendon subluxation and degenerative SLAP tear.  POSTOPERATIVE DIAGNOSIS:  Right shoulder glenohumeral joint arthritis primarily on the humeral side with grade III lesion measuring about 12 x 12 mm with some corresponding changes of grade II chondromalacia on the upper half of the glenoid with type 2  SLAP tear and degeneration of the superior anchor with medial subluxation of the biceps tendon through upper subscapularis tear along with U-shaped tear of the supraspinatus, which appears chronic.  PROCEDURES: 1.  Right shoulder arthroscopy with debridement of the superior labrum with release of the biceps tendon as well as debridement of the humeral head loose chondral fragments on the area of early arthritis. 2.  Mini open rotator cuff repair of the subscapularis upper border and supraspinatus. 3.  Mini open biceps tenodesis.  SURGEON:  Cammy Copa, MD  ASSISTANT:  Karenann Cai, PA  INDICATIONS:  The patient is a 65 year old patient with right shoulder pain of long duration.  Seen by one of my partners last year and referred for further management. Presents now with failure of conservative management and rotator cuff tearing and  medial subluxation of the biceps tendon on MRI scanning.  Symptoms have been going on for longer than a year.  DESCRIPTION OF PROCEDURE:  The patient was brought to the operating room where general anesthetic was induced.  Preoperative antibiotics were administered.  Timeout was called.  Right shoulder was prescrubbed with alcohol and Betadine and allowed to air  dry, prepped with DuraPrep solution, and draped in a sterile manner.  The  patient was in the beach chair position with the head in neutral position.  Ioban used to seal the operative field.  Examination under anesthesia demonstrates full passive range of  motion with external rotation of 15 degrees, abduction to about 60 degrees, isolated glenohumeral abduction 100, and isolated forward flexion 170.  Shoulder stability excellent.  Following examination under anesthesia, sterile prepping and draping and  sealing of the operative field with Ioban, timeout was called.  Posterior portal was created 2 cm medial and inferior to the posterolateral margin of the acromion.  Diagnostic arthroscopy was performed.  Anterior portal created under direct  visualization.  The patient did have a degenerative tear of that supraspinatus measuring about 2.5 cm x 2 cm.  Once degenerative tissue was removed, the patient also had medial subluxation of the biceps tendon with degenerative tearing of the upper  border of the subscapularis.  Degenerative SLAP tear was also present.  Arthrocare wand was used to debride the superior labrum from the 11 o'clock to 2 o'clock position.  Biceps tendon was released.  Debridement also performed on the humeral head, which  had some loose chondral flaps, but in general was more of a degenerative lesion, which had some corresponding changes on the upper half of the glenoid.  Following debridement, instruments were removed.  Portals were closed using 3-0 nylon.  Collier Flowers was  then used to cover the entire operative field.  Anterolateral incision was made.  Skin and subcutaneous tissue were sharply divided after a 5 cm incision was made off the anterolateral margin of the acromion.  Deltoid was split between the middle and  anterior raphae, marked with a #1 Vicryl suture.  Bursectomy was performed.  Rotator cuff tear was identified, which was a chronic degenerative tear measuring about 2.5 x 2 cm.  Once debridement was performed, side-to-side repair supplemented with  medial  and lateral double-row repair was performed.  The transverse humeral ligament was opened.  The biceps tendon was tenodesed under appropriate tension using 2 knotless Arthrex SutureTaks.  Good fixation was achieved.  Oversewn with 3-0 Vicryl sutures.   Subscap was then evaluated.  Upper subscap was torn with degenerative tearing of about 1 cm area.  The upper portion of the lesser tuberosity was prepared using a knife.  A double-loaded Arthrex SutureTape on a 2.4 mm SutureTape anchor with four limbs  was then placed.  Using the Scorpion, the SutureTape limbs were placed through the tendinous junction medial to the attachment site.  These were then tied and brought across the bicipital groove to be anchored in with the rotator cuff repair.  The  rotator cuff repair was then debrided.  The footprint was prepared using a rongeur and knife.  Two Arthrex SutureTape suture anchors were placed with the 8 limbs of suture placed back through the rotator cuff tendon using the Scorpion.  The side-to-side  component of the tear was then repaired using five 2-0 FiberWire sutures placed in inverted fashion and tied.  This gave a very nice closure.  The limbs were maintained and incorporated into the lateral row repair of SwiveLocks.  SutureTapes were tied  posterior to anterior.  Then, the limbs were then crossed, and the 4 suture limbs were placed with the three 2-0 FiberLoop suture ends and placed into SwiveLock posteriorly.  This was done with the arm in abducted position about 30 degrees.  The 4  SutureTapes from the subscap repair were then incorporated into a SwiveLock repair with the 4 other SutureTapes from the supraspinatus repair and the two 2-0 FiberWire sutures, which were tied.  This gave a very nice, watertight repair of both the  subscap and the supraspinatus.  The arm was taken through range of motion.  Subacromial decompression performed with the rasp, which was only required about 1-2 mm of bone  removal.  Thorough irrigation was then performed.  Vancomycin powder placed.   Deltoid split closed using #1 Vicryl suture followed by inverted 0 Vicryl suture, 2-0 Vicryl suture, and 3-0 Monocryl.  Steri-Strips and impervious dressing applied.  The patient will be started on CPM machine for 2 weeks and then discontinue sling and  initiate physical therapy.  Luke's assistance was required at all times for retraction, opening, closing, mobilization of the tissue.  His assistance was a medical necessity.   [facility] D: 06/03/2020 11:00:20 am T: 06/03/2020 7:22:00 pm  JOB: [id number]/ [id number]

## 2020-06-06 ENCOUNTER — Other Ambulatory Visit: Payer: Self-pay | Admitting: Surgical

## 2020-06-06 ENCOUNTER — Telehealth: Payer: Self-pay

## 2020-06-06 MED ORDER — GABAPENTIN 300 MG PO CAPS: 300.0000 mg | ORAL_CAPSULE | Freq: Three times a day (TID) | ORAL | 0 refills | Status: DC

## 2020-06-06 NOTE — Telephone Encounter (Signed)
Pls advise.  

## 2020-06-06 NOTE — Telephone Encounter (Signed)
Does not need to take pain medication if not needed, preferably he should take OTC medications if he can do this instead.  If he still needs the dilaudid that's okay at this early point. Regarding the hiccups, I sent in RX of gabapentin, take that until the day after the hiccups subside. If no improvement, reach back out to [place] to try a different medication or option

## 2020-06-06 NOTE — Telephone Encounter (Signed)
Patients daughter called she stated today is the 5th day for patient to be taking rx for hydromorphone she stated the patient was to only to the rx for 5 days but there are more pills left she wants to know should he continue to take rx she also stated the rx is making him have hiccups she is requesting a call back from lauren call [phone]

## 2020-06-06 NOTE — Telephone Encounter (Signed)
IC no answer. LMVM with details advising per below.  

## 2020-06-07 NOTE — Telephone Encounter (Signed)
Okay for tylenol.  Avoid NSAIDs due to cardiac history

## 2020-06-07 NOTE — Telephone Encounter (Signed)
Keith Newton pts daughter called back asking what OTC medicines can the pt take? She would like a CB to discuss further.   [phone]

## 2020-06-07 NOTE — Telephone Encounter (Signed)
Daughter aware of the below message  

## 2020-06-07 NOTE — Telephone Encounter (Signed)
See below Advil/Ibuprofen or Tylenol ok?

## 2020-06-09 DIAGNOSIS — S46119A Strain of muscle, fascia and tendon of long head of biceps, unspecified arm, initial encounter: Secondary | ICD-10-CM

## 2020-06-09 DIAGNOSIS — M75121 Complete rotator cuff tear or rupture of right shoulder, not specified as traumatic: Secondary | ICD-10-CM

## 2020-06-09 DIAGNOSIS — S43003A Unspecified subluxation of unspecified shoulder joint, initial encounter: Secondary | ICD-10-CM

## 2020-06-11 ENCOUNTER — Telehealth: Payer: Self-pay

## 2020-06-11 NOTE — Telephone Encounter (Signed)
I called may need ultram

## 2020-06-11 NOTE — Telephone Encounter (Signed)
See below

## 2020-06-11 NOTE — Telephone Encounter (Signed)
[name] Kirk Ruths called stating that patient is in more pain since his surgery and would like to know if there is something that he can take or do?  Stated that Tylenol is not helping.  Right shoulder surgery on 06/03/2020.   Patient has an appointment with Dr. August Saucer on Wednesday, 06/12/2020.  [phone].  Please advise.

## 2020-06-12 ENCOUNTER — Ambulatory Visit (INDEPENDENT_AMBULATORY_CARE_PROVIDER_SITE_OTHER): Payer: 59 | Admitting: Orthopedic Surgery

## 2020-06-12 DIAGNOSIS — M75121 Complete rotator cuff tear or rupture of right shoulder, not specified as traumatic: Secondary | ICD-10-CM

## 2020-06-12 NOTE — Telephone Encounter (Signed)
See below. Do you need to submit?

## 2020-06-13 ENCOUNTER — Other Ambulatory Visit: Payer: Self-pay | Admitting: Surgical

## 2020-06-13 ENCOUNTER — Telehealth: Payer: Self-pay | Admitting: Orthopedic Surgery

## 2020-06-13 MED ORDER — HYDROMORPHONE HCL 2 MG PO TABS: 2.0000 mg | ORAL_TABLET | Freq: Four times a day (QID) | ORAL | 0 refills | Status: AC | PRN

## 2020-06-13 NOTE — Telephone Encounter (Signed)
Kirk Ruths the pts granddaughter called asking to have his pain rx refilled, Kirk Ruths has been waiting since this morning so she would like a CB as soon as this has been sent in please.   [phone]

## 2020-06-13 NOTE — Telephone Encounter (Signed)
No he is on dilaudid for postop pain

## 2020-06-13 NOTE — Telephone Encounter (Signed)
Pt granddaughter called and was wondering if she could get a refill for this pt. She said the medication started with hydro or something like that but I do not see it in the pt chart. I asked her if it was the gabapentin and she said no he still has some of that but he switches between 2 pain medications? Call her back [phone].

## 2020-06-13 NOTE — Telephone Encounter (Signed)
Sent in RX and called her

## 2020-06-14 NOTE — Telephone Encounter (Signed)
IC to discuss. She advised that this was discussed at appointment yesterday.

## 2020-06-16 ENCOUNTER — Encounter: Payer: Self-pay | Admitting: Orthopedic Surgery

## 2020-06-16 NOTE — Progress Notes (Signed)
Post-Op Visit Note   Patient: Keith Newton           Date of Birth: 07/29/1955           MRN: [id number] Visit Date: 06/12/2020 PCP: Sandre Kitty, MD   Assessment & Plan:  Chief Complaint:  Chief Complaint  Patient presents with  . Right Shoulder - Routine Post Op   Visit Diagnoses:  1. Complete tear of right rotator cuff, unspecified whether traumatic     Plan: Patient is a 65 year old male who presents s/p right shoulder rotator cuff tear repair on 06/03/2020.  Had subscapularis and supraspinatus tear that was repaired.  Reports that he is doing okay but having to take pain medication about every 4 hours.  Pain is keeping him up at night.  He is compliant with using the sling and with not lifting the arm under 10 power or any lifting of the operative arm.  On exam incisions are healing well and sutures were removed and replaced with Steri-Strips.  He is using CPM machine but not for the full 3 hours a day.  Recommended he continue using the CPM machine and try and increase as he can.  On exam he is 20 degrees external rotation, 65 degrees abduction, 75 degrees forward flexion.  Axillary nerve is intact and firing.  Plan to follow-up in 2 weeks for clinical recheck and likely initiation of physical therapy at that time.  Pain medication was refilled but at a lower frequency in order to wean patient off of opioid medication.  Patient and family agreed with plan.  Follow-up in 2 weeks.  Follow-Up Instructions: No follow-ups on file.   Orders:  No orders of the defined types were placed in this encounter.  No orders of the defined types were placed in this encounter.   Imaging: No results found.  PMFS History: Patient Active Problem List   Diagnosis Date Noted  . Complete tear of right rotator cuff   . Subluxation of tendon of long head of biceps   . Surgery, elective 05/09/2020  . Pre-operative clearance 04/23/2020  . Gastrocnemius strain, left 03/23/2020  . Venous  incompetence 12/28/2019  . Exertional dyspnea 11/08/2019  . Chronic right shoulder pain 09/02/2019  . Diabetic neuropathy (HCC) 08/16/2019  . Chest pain of uncertain etiology 04/27/2019  . CAD -S/P PCI 01/02/2019  . Obesity (BMI 30-39.9) 01/02/2019  . Orthostatic dizziness 01/02/2019  . Hyperlipidemia LDL goal <70 12/25/2018  . History of non-ST elevation myocardial infarction (NSTEMI) 12/23/2018  . Type 2 diabetes mellitus with complication (HCC)   . Tachycardia   . Hypertension    Past Medical History:  Diagnosis Date  . Arthritis   . Back pain   . CAD (coronary artery disease)    a. s/p NSTEMI in 12/2018 with 80% Prox-RCA stenosis followed by 100% prox to mid-RCA stenosis --> DESx2 to the RCA. Med management of residual disease.   . Carpal tunnel syndrome   . DDD (degenerative disc disease)   . Diabetes mellitus    type 2  . Hyperlipidemia   . Hypertension    states very mild  . Low testosterone   . Myocardial infarction (HCC)   . Overweight   . Seasonal allergies   . Tachycardia     Family History  Problem Relation Age of Onset  . Stroke Mother   . CAD Father   . CAD Maternal Grandfather   . CAD Paternal Grandfather     Past Surgical  History:  Procedure Laterality Date  . CARDIAC CATHETERIZATION    . CARPAL TUNNEL RELEASE  07/02/2011   Procedure: CARPAL TUNNEL RELEASE;  Surgeon: Wyn Forster., MD;  Location: [facility];  Service: Orthopedics;  Laterality: Right;  . CARPAL TUNNEL RELEASE  07/30/2011   Procedure: CARPAL TUNNEL RELEASE;  Surgeon: Wyn Forster., MD;  Location: [facility];  Service: Orthopedics;  Laterality: Left;  . CORONARY ANGIOPLASTY    . CORONARY STENT INTERVENTION N/A 12/23/2018   Procedure: CORONARY STENT INTERVENTION;  Surgeon: Corky Crafts, MD;  Location[facility];  Service: Cardiovascular;  Laterality: N/A;  . LEFT HEART CATH AND CORONARY ANGIOGRAPHY N/A 12/23/2018   Procedure: LEFT  HEART CATH AND CORONARY ANGIOGRAPHY;  Surgeon: Corky Crafts, MD;  Location: [facility] INVASIVE CV LAB;  Service: Cardiovascular;  Laterality: N/A;  . LUMBAR EPIDURAL INJECTION     x2  . SHOULDER ARTHROSCOPY WITH OPEN ROTATOR CUFF REPAIR AND DISTAL CLAVICLE ACROMINECTOMY Right 06/03/2020   Procedure: right shoulder arthroscopy, biceps tenodesis, mini open rotator cuff tear repair subscap/supraspinatus, lateral debridement;  Surgeon: Cammy Copa, MD;  Location: [facility] OR;  Service: Orthopedics;  Laterality: Right;   Social History   Occupational History  . Occupation: retired  Tobacco Use  . Smoking status: Never Smoker  . Smokeless tobacco: Never Used  Vaping Use  . Vaping Use: Never used  Substance and Sexual Activity  . Alcohol use: No  . Drug use: No  . Sexual activity: Not on file

## 2020-06-24 ENCOUNTER — Telehealth: Payer: Self-pay | Admitting: Orthopedic Surgery

## 2020-06-24 NOTE — Telephone Encounter (Signed)
He's currently on dilaudid and has an allergy to codeine (lips and facial swelling) so options for pain control are somewhat limited.  Don't think it's appropriate to increase dose of dilaudid this far out from procedure

## 2020-06-24 NOTE — Telephone Encounter (Signed)
agree

## 2020-06-24 NOTE — Telephone Encounter (Signed)
Can either of you please advise.  

## 2020-06-24 NOTE — Telephone Encounter (Signed)
[name] Kirk Ruths called a stronger medication. She states patient is barley sleeping and nothing is touching his pain. He has tried different medications and nothing is working. Please call patient daughter about this matter. Please send medication to pharmacy on file.Phone number is [phone].

## 2020-06-25 ENCOUNTER — Other Ambulatory Visit: Payer: Self-pay

## 2020-06-25 ENCOUNTER — Ambulatory Visit (INDEPENDENT_AMBULATORY_CARE_PROVIDER_SITE_OTHER): Payer: 59 | Admitting: Family Medicine

## 2020-06-25 VITALS — BP 138/90 | HR 95 | Ht 68.0 in | Wt 241.0 lb

## 2020-06-25 DIAGNOSIS — S46011D Strain of muscle(s) and tendon(s) of the rotator cuff of right shoulder, subsequent encounter: Secondary | ICD-10-CM | POA: Diagnosis not present

## 2020-06-25 DIAGNOSIS — E118 Type 2 diabetes mellitus with unspecified complications: Secondary | ICD-10-CM

## 2020-06-25 DIAGNOSIS — E1142 Type 2 diabetes mellitus with diabetic polyneuropathy: Secondary | ICD-10-CM

## 2020-06-25 LAB — POCT GLYCOSYLATED HEMOGLOBIN (HGB A1C): HbA1c, POC (controlled diabetic range): 8 % — AB (ref 0.0–7.0)

## 2020-06-25 MED ORDER — LIDOCAINE 5 % EX PTCH: 1.0000 | MEDICATED_PATCH | CUTANEOUS | 0 refills | Status: DC

## 2020-06-25 NOTE — Patient Instructions (Signed)
It was nice to see you today,  Congratulations on getting your blood sugar down to 8.  We will not make any changes to your diabetes medications today.  We will monitor again in the next 3 months.  For your pain, we did the following: - I have added a prescription for lidocaine patch.  You can put this on your arm or shoulder once a day.  Do not place it directly over the incision site. - You can use Voltaren gel or the equivalent such as Aleve gel as needed in the area of pain. - For your leg pain it is okay for you to take an extra dose of 75 mg at night when you take your nightly dose.  This would put you at the max dose so do not take more than that.  So for now you can take 70 5 in the morning, 70 5 in the afternoon, and 150 mg at night.  Please let your orthopedist know this tomorrow if he plans on prescribing any other medications for you. -The pain after rotator cuff surgery can last up to 8 weeks.  Please do not let this interfere with going to physical rehab as rehab is just important as the surgery.  Have a great day,  Frederic Jericho, MD

## 2020-06-25 NOTE — Assessment & Plan Note (Signed)
Status post surgical repair 2 weeks ago.  Still has significant amount of pain.  Dilaudid did not help.  Pain is interfering with his sleep.  Has appointment with orthopedist tomorrow. - Prescribed lidocaine patch.  Advised not to put over the site of incision. - Also advised patient he can use Voltaren gel,, as well as the other topicals they were using which included a menthol ointment.

## 2020-06-25 NOTE — Telephone Encounter (Signed)
Can either of you please call and further discuss with patient?  IC him to advise per note below. Patient proceed to yell and profanity stating this was unacceptable and that unless I had a solution for him I should have never called him Stated he has been in pain day and night since srugery.

## 2020-06-25 NOTE — Progress Notes (Signed)
    SUBJECTIVE:   CHIEF COMPLAINT / HPI:   Rotator cuff surgery: Patient still having significant pain in the right shoulder from the surgery.  States the pain radiates down his arm.  Makes it difficult for him to sleep.  He was taking Dilaudid, which he states was not helping.  He is now out of it.  He was advised to stop taking his Lyrica by his orthopedist when they started to Dilaudid.  Since he ran out of the Dilaudid he has resumed taking his Lyrica due to worsening of his neuropathy.  Patient has been putting Biofreeze and Vicks VapoRub on it.  Wants to know if there is anything else to try.  DM: His diabetes medication.  States that diabetic neuropathy worsened after stopping the Lyrica.  Prior to starting Lyrica he states that was much improved.  PERTINENT  PMH / PSH: Diabetes, rotator cuff tear s/p repair  OBJECTIVE:   BP 138/90   Pulse 95   Ht 5\' 8"  (1.727 m)   Wt 241 lb (109.3 kg)   SpO2 100%   BMI 36.64 kg/m   General: Alert and oriented.  Mild amount of pain.  Accompanied by daughter and wife. Skin: Surgical incisions appear well-healing on right shoulder.  No signs of infection.  ASSESSMENT/PLAN:   Type 2 diabetes mellitus with complication (HCC) A1c is lowest it ever has been at 8.0.  We will not change anything currently.  We will follow-up in 3 months.  Diabetic neuropathy (HCC) Recently restarted his Lyrica.  Advised patient he can take an increased nighttime dose over the next few weeks while his neuropathy is worse.  Advised him he can take 75 mg in the morning, 75 mg in the afternoon, and 150 mg at night.  This is the max daily dose.  Advised him not to go further than this.  Complete tear of right rotator cuff Status post surgical repair 2 weeks ago.  Still has significant amount of pain.  Dilaudid did not help.  Pain is interfering with his sleep.  Has appointment with orthopedist tomorrow. - Prescribed lidocaine patch.  Advised not to put over the site of  incision. - Also advised patient he can use Voltaren gel,, as well as the other topicals they were using which included a menthol ointment.     , MD [facility]

## 2020-06-25 NOTE — Assessment & Plan Note (Signed)
A1c is lowest it ever has been at 8.0.  We will not change anything currently.  We will follow-up in 3 months.

## 2020-06-25 NOTE — Assessment & Plan Note (Signed)
Recently restarted his Lyrica.  Advised patient he can take an increased nighttime dose over the next few weeks while his neuropathy is worse.  Advised him he can take 75 mg in the morning, 75 mg in the afternoon, and 150 mg at night.  This is the max daily dose.  Advised him not to go further than this.

## 2020-06-26 ENCOUNTER — Ambulatory Visit (INDEPENDENT_AMBULATORY_CARE_PROVIDER_SITE_OTHER): Payer: 59 | Admitting: Orthopedic Surgery

## 2020-06-26 DIAGNOSIS — M75121 Complete rotator cuff tear or rupture of right shoulder, not specified as traumatic: Secondary | ICD-10-CM

## 2020-06-26 MED ORDER — CYCLOBENZAPRINE HCL 10 MG PO TABS: ORAL_TABLET | ORAL | 0 refills | Status: DC

## 2020-06-26 NOTE — Telephone Encounter (Signed)
Talked today

## 2020-06-28 ENCOUNTER — Other Ambulatory Visit: Payer: Self-pay

## 2020-06-28 ENCOUNTER — Ambulatory Visit: Payer: 59 | Attending: Family Medicine | Admitting: Physical Therapy

## 2020-06-28 ENCOUNTER — Encounter: Payer: Self-pay | Admitting: Physical Therapy

## 2020-06-28 VITALS — BP 151/92 | HR 90

## 2020-06-28 DIAGNOSIS — M25672 Stiffness of left ankle, not elsewhere classified: Secondary | ICD-10-CM | POA: Insufficient documentation

## 2020-06-28 DIAGNOSIS — M6281 Muscle weakness (generalized): Secondary | ICD-10-CM | POA: Diagnosis present

## 2020-06-28 DIAGNOSIS — R262 Difficulty in walking, not elsewhere classified: Secondary | ICD-10-CM | POA: Diagnosis present

## 2020-06-28 DIAGNOSIS — M25511 Pain in right shoulder: Secondary | ICD-10-CM | POA: Insufficient documentation

## 2020-06-28 NOTE — Therapy (Signed)
[facility] [address] [place], [place], [address] Phone: [phone]   Fax:  [phone]  Physical Therapy Evaluation  Patient Details  Name: Keith Newton MRN: [id number] Date of Birth: Sep 06, 1955 Referring Provider (PT): August Saucer Corrie Mckusick, MD   Encounter Date: 06/28/2020   PT End of Session - 06/28/20 1029    Visit Number 1    Number of Visits 16    Authorization - Visit Number 6    Authorization - Number of Visits 30    PT Start Time 1029    PT Stop Time 1115    PT Time Calculation (min) 46 min    Activity Tolerance Patient tolerated treatment well;Patient limited by pain    Behavior During Therapy [facility] for tasks assessed/performed           Past Medical History:  Diagnosis Date  . Arthritis   . Back pain   . CAD (coronary artery disease)    a. s/p NSTEMI in 12/2018 with 80% Prox-RCA stenosis followed by 100% prox to mid-RCA stenosis --> DESx2 to the RCA. Med management of residual disease.   . Carpal tunnel syndrome   . DDD (degenerative disc disease)   . Diabetes mellitus    type 2  . Hyperlipidemia   . Hypertension    states very mild  . Low testosterone   . Myocardial infarction (HCC)   . Overweight   . Seasonal allergies   . Tachycardia     Past Surgical History:  Procedure Laterality Date  . CARDIAC CATHETERIZATION    . CARPAL TUNNEL RELEASE  07/02/2011   Procedure: CARPAL TUNNEL RELEASE;  Surgeon: Wyn Forster., MD;  Location: [facility];  Service: Orthopedics;  Laterality: Right;  . CARPAL TUNNEL RELEASE  07/30/2011   Procedure: CARPAL TUNNEL RELEASE;  Surgeon: Wyn Forster., MD;  Location: [facility];  Service: Orthopedics;  Laterality: Left;  . CORONARY ANGIOPLASTY    . CORONARY STENT INTERVENTION N/A 12/23/2018   Procedure: CORONARY STENT INTERVENTION;  Surgeon: Corky Crafts, MD;  Location: [facility] INVASIVE CV LAB;  Service: Cardiovascular;   Laterality: N/A;  . LEFT HEART CATH AND CORONARY ANGIOGRAPHY N/A 12/23/2018   Procedure: LEFT HEART CATH AND CORONARY ANGIOGRAPHY;  Surgeon: Corky Crafts, MD;  Location: [facility] INVASIVE CV LAB;  Service: Cardiovascular;  Laterality: N/A;  . LUMBAR EPIDURAL INJECTION     x2  . SHOULDER ARTHROSCOPY WITH OPEN ROTATOR CUFF REPAIR AND DISTAL CLAVICLE ACROMINECTOMY Right 06/03/2020   Procedure: right shoulder arthroscopy, biceps tenodesis, mini open rotator cuff tear repair subscap/supraspinatus, lateral debridement;  Surgeon: Cammy Copa, MD;  Location: [facility];  Service: Orthopedics;  Laterality: Right;    Vitals:   06/28/20 1055  BP: (!) 151/92  Pulse: 90  SpO2: 97%      Subjective Assessment - 06/28/20 1029    Subjective Pt reports that he is doing "bad" today.  He is not wearing his sling because he reports that it was irritating to his neck and was not helpful.  He reports history of chronic R shoulder pain.  He report history of manual labor and is unsure of when the original happened.  He underwent R/C repair (supraspinatus and subscap) with biceps tenodesis on 06/03/2020.  He has been using CPM machine since surgery but has done no formal exercise.  He is icing frequently.  Denies fever and chills.    Pertinent History R/C repair (supraspinatus and subscap) with  biceps tenodesis on 06/03/2020, heart attack 12/2020, DMT II    Limitations Lifting;Reading;Writing;House hold activities;Sitting    How long can you sit comfortably? 30 min    How long can you stand comfortably? 30 min    How long can you walk comfortably? not limited    Patient Stated Goals get shoulder better    Currently in Pain? Yes    Pain Score 9     Pain Location Shoulder    Pain Orientation Right    Pain Descriptors / Indicators Sharp;Shooting    Pain Type Chronic pain;Surgical pain;Acute pain    Pain Onset More than a month ago    Pain Frequency Constant    Aggravating Factors  movement    Pain Relieving  Factors ice, topical cream    Effect of Pain on Daily Activities anything involving lifting, reaching, lifting, writing              [facility] PT Assessment - 06/28/20 0001      Assessment   Medical Diagnosis Complete tear of right rotator cuff, unspecified whether traumatic (D66.440)    Referring Provider (PT) Cammy Copa, MD    Onset Date/Surgical Date 06/03/20    Hand Dominance Right    Prior Therapy none for this injury      Precautions   Precaution Comments repair of supraspinatus and sub scap with biceps tenodesis on 06/03/2020      Restrictions   Weight Bearing Restrictions --   repair of supraspinatus and sub scap with biceps tenodesis on 06/03/2020     Balance Screen   Has the patient fallen in the past 6 months No      Home Environment   Living Environment Private residence    Living Arrangements Spouse/significant other;Children    Available Help at Discharge Family;Friend(s)    Type of Home House    Entrance Stairs-Number of Steps 9    Entrance Stairs-Rails Left    Home Layout One level    Home Equipment None      Prior Function   Level of Independence Independent    Vocation Retired    Leisure no activities      Observation/Other Assessments   Observations forward head, rounded shoulders    Focus on Therapeutic Outcomes (FOTO)  30 -> 66      PROM   Overall PROM Comments L shoulder WFL    Right Shoulder Flexion 45 Degrees   pain   Right Shoulder ABduction 40 Degrees   pain   Right Shoulder External Rotation 10 Degrees   pain     Strength   Right Hand Grip (lbs) --   3.5 KG   Left Hand Grip (lbs) --   30 KG                     Objective measurements completed on examination: See above findings.               PT Education - 06/28/20 1106    Education Details Educated on POC, importance of precautions, importance of sling, healing times    Person(s) Educated Patient    Methods Explanation;Demonstration;Handout     Comprehension Verbalized understanding            PT Short Term Goals - 06/28/20 1128      PT SHORT TERM GOAL #1   Title [name] will be >75% HEP compliant within 3 weeks to facilitate carryover between sessions and move towards eventual  independent management of their condition.    Baseline Started on eval    Time 3    Period Weeks    Status New    Target Date 07/19/20             PT Long Term Goals - 06/28/20 1128      PT LONG TERM GOAL #1   Title Bradly ChrisGrady Roundtree will achieve >=60 degrees of shoulder ER at 90 degrees to allow proper shoulder mechanics during OH movement.    Baseline 10 at neutral    Time 8    Period Weeks    Status New    Target Date 08/23/20      PT LONG TERM GOAL #2   Title Bradly ChrisGrady Grieser will demonstrate >130 degrees of active ROM, not limited by pain, in abduction and flexion to allow completion of activities involving reaching [facility]    Baseline 45 passive flexoin    Time 8    Period Weeks    Status New    Target Date 08/23/20      PT LONG TERM GOAL #3   Title Bradly ChrisGrady Cotham will be able to reach [facility] into cabinet, not limited by pain    Baseline unable    Time 8    Period Weeks    Status New    Target Date 08/23/20      PT LONG TERM GOAL #4   Title Bradly ChrisGrady Caldron will be able to resume driving, not limited by pain    Baseline unable    Time 8    Period Weeks    Status New    Target Date 08/23/20      PT LONG TERM GOAL #5   Title Bradly ChrisGrady Freehling will be compliant with post op precautions throughout therapy.    Baseline non compliant    Time 8    Period Weeks    Status New    Target Date 08/23/20      PT LONG TERM GOAL #6   Title Bradly ChrisGrady Stober will improve FOTO score from 30 to 66 as a proxy for functional improvement    Time 8    Period Weeks    Status New    Target Date 08/23/20                  Plan - 06/28/20 1120    Clinical Impression Statement Bradly ChrisGrady Hagedorn is a 65 y.o. male who presents to clinic with signs and sxs  consistent with R R/C repair (sub scap and supraspinatus) with biceps tenodesis on 06/03/2020.  Pt presents with pain and notable deficits in: R shoulder strength and pain.  Pt is limited functionally in OH reaching, IADLs, and lifting.  Pt presents with higher level pain than expected given time since surgery.  He also D/C sling without MD orders.  We discussed at length the need to follow precautions and utilize sling; he confirms understanding and agrees to comply.  I also gave him a passive flexion stretch which we reviewed and I instructed him to complete sitting down; he confirmed undertsanding. Pt willI benefit from skilled therapy to address pain and the listed deficits in order to achieve functional goals, enable safety and independence in completion of daily tasks, and return to PLOF.    Personal Factors and Comorbidities Fitness;Comorbidity 3+;Behavior Pattern    Comorbidities obesity, CAD, HTN, tachycardia, DM, HA    Examination-Activity Limitations Bathing;Carry;Dressing;Lift;Reach Overhead    Examination-Participation Restrictions Cleaning;Community Activity;Driving    Stability/Clinical  Decision Making Stable/Uncomplicated    Clinical Decision Making Low    Rehab Potential Good    PT Frequency 2x / week    PT Duration 8 weeks    PT Treatment/Interventions ADLs/Self Care Home Management;Cryotherapy;Electrical Stimulation;Ultrasound;Moist Heat;Iontophoresis 4mg /ml Dexamethasone;Gait training;Stair training;Functional mobility training;Therapeutic activities;Therapeutic exercise;Balance training;Patient/family education;Manual techniques;Dry needling;Passive range of motion;Taping;Vasopneumatic Device    PT Next Visit Plan Assess response to HEP and progress PRN, progress PROM    PT Home Exercise Plan [email or web address]    Consulted and Agree with Plan of Care Patient           Patient will benefit from skilled therapeutic intervention in order to improve the following deficits and impairments:   Decreased range of motion,Obesity,Decreased activity tolerance,Pain,Decreased strength,Decreased balance,Difficulty walking,Decreased knowledge of precautions,Impaired UE functional use  Visit Diagnosis: Right shoulder pain, unspecified chronicity  Muscle weakness     Problem List Patient Active Problem List   Diagnosis Date Noted  . Complete tear of right rotator cuff   . Subluxation of tendon of long head of biceps   . Surgery, elective 05/09/2020  . Pre-operative clearance 04/23/2020  . Gastrocnemius strain, left 03/23/2020  . Venous incompetence 12/28/2019  . Exertional dyspnea 11/08/2019  . Chronic right shoulder pain 09/02/2019  . Diabetic neuropathy (HCC) 08/16/2019  . Chest pain of uncertain etiology 04/27/2019  . CAD -S/P PCI 01/02/2019  . Obesity (BMI 30-39.9) 01/02/2019  . Orthostatic dizziness 01/02/2019  . Hyperlipidemia LDL goal <70 12/25/2018  . History of non-ST elevation myocardial infarction (NSTEMI) 12/23/2018  . Type 2 diabetes mellitus with complication (HCC)   . Tachycardia   . Hypertension     13/07/2018 06/28/2020, 1:10 PM  [facility] [address] [place], [place], [place] Phone: [phone]   Fax:  [phone]  Name: Nyshaun Standage MRN: [name] Date of Birth: 1955/12/06

## 2020-06-28 NOTE — Patient Instructions (Addendum)
Access Code: [email or web address] URL: [email or web address] Date: 06/28/2020 Prepared by: Alphonzo Severance  Exercises Seated Shoulder and Trunk Flexion at Table - 3 x daily - 7 x weekly - 1 sets - 10 reps Forearm Strengthening with Newman Pies Squeeze - 3 x daily - 7 x weekly - 3 sets - 10 reps

## 2020-06-29 ENCOUNTER — Encounter: Payer: Self-pay | Admitting: Orthopedic Surgery

## 2020-06-29 NOTE — Progress Notes (Signed)
Post-Op Visit Note   Patient: Keith Newton           Date of Birth: February 24, 1955           MRN: [id number] Visit Date: 06/26/2020 PCP: Sandre Kitty, MD   Assessment & Plan:  Chief Complaint:  Chief Complaint  Patient presents with  . Post-op Follow-up    06/03/20 right shoulder scope BT MORCTR   Visit Diagnoses:  1. Complete tear of right rotator cuff, unspecified whether traumatic     Plan: Keith Newton is a 65 year old patient who is now about a month out right shoulder arthroscopy with rotator cuff repair.  He was on Lyrica for neuropathy preoperatively.  Pain medicines are not helping much.  He is allergic to most pain medicine.  On examination he actually has pretty reasonable passive and active range of motion with no coarse grinding or crepitus.  It is painful for him to get close to 90 degrees of abduction.  Plan is to start physical therapy for active assisted range of motion passive range of motion for 3 weeks and then okay to start strengthening.  Flexeril 10 mg twice a day for pain and spasm.  He is going to continue with his Lyrica for neuropathy.  All in all the shoulder looks pretty reasonable but he is having a lot of pain.  I think that is in large part due to the amount of preop pain medicine that he was taking.  Nonetheless I think it should abate over time.  Follow-up in 4 weeks for clinical recheck.  Follow-Up Instructions: Return in about 3 weeks (around 07/17/2020).   Orders:  Orders Placed This Encounter  Procedures  . Ambulatory referral to Physical Therapy   Meds ordered this encounter  Medications  . cyclobenzaprine (FLEXERIL) 10 MG tablet    Sig: 1 po bid to tid prn    Dispense:  30 tablet    Refill:  0    Imaging: No results found.  PMFS History: Patient Active Problem List   Diagnosis Date Noted  . Complete tear of right rotator cuff   . Subluxation of tendon of long head of biceps   . Surgery, elective 05/09/2020  . Pre-operative clearance  04/23/2020  . Gastrocnemius strain, left 03/23/2020  . Venous incompetence 12/28/2019  . Exertional dyspnea 11/08/2019  . Chronic right shoulder pain 09/02/2019  . Diabetic neuropathy (HCC) 08/16/2019  . Chest pain of uncertain etiology 04/27/2019  . CAD -S/P PCI 01/02/2019  . Obesity (BMI 30-39.9) 01/02/2019  . Orthostatic dizziness 01/02/2019  . Hyperlipidemia LDL goal <70 12/25/2018  . History of non-ST elevation myocardial infarction (NSTEMI) 12/23/2018  . Type 2 diabetes mellitus with complication (HCC)   . Tachycardia   . Hypertension    Past Medical History:  Diagnosis Date  . Arthritis   . Back pain   . CAD (coronary artery disease)    a. s/p NSTEMI in 12/2018 with 80% Prox-RCA stenosis followed by 100% prox to mid-RCA stenosis --> DESx2 to the RCA. Med management of residual disease.   . Carpal tunnel syndrome   . DDD (degenerative disc disease)   . Diabetes mellitus    type 2  . Hyperlipidemia   . Hypertension    states very mild  . Low testosterone   . Myocardial infarction (HCC)   . Overweight   . Seasonal allergies   . Tachycardia     Family History  Problem Relation Age of Onset  . Stroke Mother   .  CAD Father   . CAD Maternal Grandfather   . CAD Paternal Grandfather     Past Surgical History:  Procedure Laterality Date  . CARDIAC CATHETERIZATION    . CARPAL TUNNEL RELEASE  07/02/2011   Procedure: CARPAL TUNNEL RELEASE;  Surgeon: Wyn Forster., MD;  Location: [facility];  Service: Orthopedics;  Laterality: Right;  . CARPAL TUNNEL RELEASE  07/30/2011   Procedure: CARPAL TUNNEL RELEASE;  Surgeon: Wyn Forster., MD;  Location: [facility];  Service: Orthopedics;  Laterality: Left;  . CORONARY ANGIOPLASTY    . CORONARY STENT INTERVENTION N/A 12/23/2018   Procedure: CORONARY STENT INTERVENTION;  Surgeon: Corky Crafts, MD;  Location: [facility] INVAS[facility] CV LAB;  Service: Cardiovascular;  Laterality: N/A;  . LEFT HEART  CATH AND CORONARY ANGIOGRAPHY N/A 12/23/2018   Procedure: LEFT HEART CATH AND CORONARY ANGIOGRAPHY;  Surgeon: Corky Crafts, MD;  Location: [facility] INVASIVE CV LAB;  Service: Cardiovascular;  Laterality: N/A;  . LUMBAR EPIDURAL INJECTION     x2  . SHOULDER ARTHROSCOPY WITH OPEN ROTATOR CUFF REPAIR AND DISTAL CLAVICLE ACROMINECTOMY Right 06/03/2020   Procedure: right shoulder arthroscopy, biceps tenodesis, mini open rotator cuff tear repair subscap/supraspinatus, lateral debridement;  Surgeon: Cammy Copa, MD;  Location: [facility];  Service: Orthopedics;  Laterality: Right;   Social History   Occupational History  . Occupation: retired  Tobacco Use  . Smoking status: Never Smoker  . Smokeless tobacco: Never Used  Vaping Use  . Vaping Use: Never used  Substance and Sexual Activity  . Alcohol use: No  . Drug use: No  . Sexual activity: Not on file

## 2020-07-02 ENCOUNTER — Other Ambulatory Visit: Payer: Self-pay | Admitting: Family Medicine

## 2020-07-05 ENCOUNTER — Ambulatory Visit: Payer: 59

## 2020-07-05 ENCOUNTER — Other Ambulatory Visit: Payer: Self-pay

## 2020-07-05 DIAGNOSIS — M25672 Stiffness of left ankle, not elsewhere classified: Secondary | ICD-10-CM

## 2020-07-05 DIAGNOSIS — M25511 Pain in right shoulder: Secondary | ICD-10-CM | POA: Diagnosis not present

## 2020-07-05 DIAGNOSIS — M6281 Muscle weakness (generalized): Secondary | ICD-10-CM

## 2020-07-05 NOTE — Therapy (Signed)
[facility] Outpatient Rehabilitation [facility] [address] [place], [place], [address] Phone: [phone]   Fax:  [phone]  Physical Therapy Treatment  Patient Details  Name: Keith Newton MRN: [id number] Date of Birth: [birth year] Referring Provider (PT): August Saucer Corrie Mckusick, MD   Encounter Date: 07/05/2020   PT End of Session - 07/05/20 1022    Visit Number 2    Number of Visits 16    Date for PT Re-Evaluation 05/11/20    Authorization Type [employer]    Authorization - Visit Number 1    Authorization - Number of Visits 30    PT Start Time 1005    PT Stop Time 1055    PT Time Calculation (min) 50 min    Activity Tolerance Patient tolerated treatment well;Patient limited by pain    Behavior During Therapy [facility] for tasks assessed/performed           Past Medical History:  Diagnosis Date  . Arthritis   . Back pain   . CAD (coronary artery disease)    a. s/p NSTEMI in 12/2018 with 80% Prox-RCA stenosis followed by 100% prox to mid-RCA stenosis --> DESx2 to the RCA. Med management of residual disease.   . Carpal tunnel syndrome   . DDD (degenerative disc disease)   . Diabetes mellitus    type 2  . Hyperlipidemia   . Hypertension    states very mild  . Low testosterone   . Myocardial infarction (HCC)   . Overweight   . Seasonal allergies   . Tachycardia     Past Surgical History:  Procedure Laterality Date  . CARDIAC CATHETERIZATION    . CARPAL TUNNEL RELEASE  07/02/2011   Procedure: CARPAL TUNNEL RELEASE;  Surgeon: Wyn Forster., MD;  Location: [facility];  Service: Orthopedics;  Laterality: Right;  . CARPAL TUNNEL RELEASE  07/30/2011   Procedure: CARPAL TUNNEL RELEASE;  Surgeon: Wyn Forster., MD;  Location: [facility];  Service: Orthopedics;  Laterality: Left;  . CORONARY ANGIOPLASTY    . CORONARY STENT INTERVENTION N/A 12/23/2018   Procedure: CORONARY STENT INTERVENTION;  Surgeon: Corky Crafts, MD;  Location: [facility] INVASIVE CV LAB;  Service: Cardiovascular;  Laterality: N/A;  . LEFT HEART CATH AND CORONARY ANGIOGRAPHY N/A 12/23/2018   Procedure: LEFT HEART CATH AND CORONARY ANGIOGRAPHY;  Surgeon: Corky Crafts, MD;  Location: [facility] INVASIVE CV LAB;  Service: Cardiovascular;  Laterality: N/A;  . LUMBAR EPIDURAL INJECTION     x2  . SHOULDER ARTHROSCOPY WITH OPEN ROTATOR CUFF REPAIR AND DISTAL CLAVICLE ACROMINECTOMY Right 06/03/2020   Procedure: right shoulder arthroscopy, biceps tenodesis, mini open rotator cuff tear repair subscap/supraspinatus, lateral debridement;  Surgeon: Cammy Copa, MD;  Location: [facility] OR;  Service: Orthopedics;  Laterality: Right;    There were no vitals filed for this visit.   Subjective Assessment - 07/05/20 1007    Subjective Pt reports his R shoulder is continuing to hurt, but it is easing up a little.    Pertinent History R/C repair (supraspinatus and subscap) with biceps tenodesis on 06/03/2020, heart attack 12/2020, DMT II    Patient Stated Goals get shoulder better    Currently in Pain? Yes    Pain Score 7     Pain Location Shoulder    Pain Orientation Right    Pain Descriptors / Indicators Sharp;Shooting    Pain Type Chronic pain    Pain Onset More than a month ago  Pain Frequency Constant              [facility] PT Assessment - 07/05/20 0001      PROM   Right Shoulder Flexion 95 Degrees    Right Shoulder ABduction 60 Degrees    Right Shoulder External Rotation 40 Degrees                         [facility] Adult PT Treatment/Exercise - 07/05/20 0001      Exercises   Exercises Shoulder      Shoulder Exercises: Seated   External Rotation PROM;10 reps   5 sec hold   External Rotation Limitations seated; table top support    Flexion PROM;10 reps   5 sec hold   Flexion Weight (lbs) seated; table top support    Abduction PROM;10 reps   Scaption; 5 sec hold   ABduction Limitations seated; table top support     Other Seated Exercises grip-ball squeeze 20x, 3 sec      Modalities   Modalities Cryotherapy      Cryotherapy   Number Minutes Cryotherapy 10 Minutes    Cryotherapy Location Shoulder   R   Type of Cryotherapy Ice pack                  PT Education - 07/05/20 1054    Education Details Encouraged pt to wear the shoulder sling as per MD post surgical recommendations, Recommnedations for shoulder support in sitting and sleeping provided. HEP upadted. Provided tennis ball for ball squeeze exs. Reviewed use of cold pack for pain and swelling assistance for 10-15 mins, every 2 hours as needed    Person(s) Educated Patient    Methods Explanation;Demonstration;Tactile cues;Verbal cues;Handout    Comprehension Verbalized understanding;Returned demonstration;Verbal cues required;Tactile cues required            PT Short Term Goals - 06/28/20 1128      PT SHORT TERM GOAL #1   Title [name] will be >75% HEP compliant within 3 weeks to facilitate carryover between sessions and move towards eventual independent management of their condition.    Baseline Started on eval    Time 3    Period Weeks    Status New    Target Date 07/19/20             PT Long Term Goals - 06/28/20 1128      PT LONG TERM GOAL #1   Title [name] will achieve >=60 degrees of shoulder ER at 90 degrees to allow proper shoulder mechanics during OH movement.    Baseline 10 at neutral    Time 8    Period Weeks    Status New    Target Date 08/23/20      PT LONG TERM GOAL #2   Title [name] will demonstrate >130 degrees of active ROM, not limited by pain, in abduction and flexion to allow completion of activities involving reaching [facility]    Baseline 45 passive flexoin    Time 8    Period Weeks    Status New    Target Date 08/23/20      PT LONG TERM GOAL #3   Title [name] will be able to reach [facility] into cabinet, not limited by pain    Baseline unable    Time 8    Period Weeks     Status New    Target Date 08/23/20  PT LONG TERM GOAL #4   Title Bradly ChrisGrady Kempa will be able to resume driving, not limited by pain    Baseline unable    Time 8    Period Weeks    Status New    Target Date 08/23/20      PT LONG TERM GOAL #5   Title Bradly ChrisGrady Trefz will be compliant with post op precautions throughout therapy.    Baseline non compliant    Time 8    Period Weeks    Status New    Target Date 08/23/20      PT LONG TERM GOAL #6   Title Bradly ChrisGrady Cape will improve FOTO score from 30 to 66 as a proxy for functional improvement    Time 8    Period Weeks    Status New    Target Date 08/23/20                 Plan - 07/05/20 1024    Clinical Impression Statement Pt presents to PT again without wearing L shoulder sling. Reviewed post surgical directives for sling use.  Provided recommendations for R UE support c pillows in sitting and sleeping and use of cold packs to assist in pain management. Pt voiced understanding of education. PT was completed for PROM exs of the L shoulder on table for support. Shoulder flexion of 95d was achieved, while ER was limited to 40d and Scaption to 60d. Pt completed therex correctly and tolerated well without increase in pain. A cold pack was applied to the L shoulder for 10 mins post ex for pain and swelling management.    Personal Factors and Comorbidities Fitness;Comorbidity 3+;Behavior Pattern    Comorbidities obesity, CAD, HTN, tachycardia, DM, HA    Examination-Activity Limitations Bathing;Carry;Dressing;Lift;Reach Overhead    Examination-Participation Restrictions Cleaning;Community Activity;Driving    Stability/Clinical Decision Making Stable/Uncomplicated    Clinical Decision Making Low    Rehab Potential Good    PT Frequency 2x / week    PT Duration 8 weeks    PT Treatment/Interventions ADLs/Self Care Home Management;Cryotherapy;Electrical Stimulation;Ultrasound;Moist Heat;Iontophoresis 4mg /ml Dexamethasone;Gait training;Stair  training;Functional mobility training;Therapeutic activities;Therapeutic exercise;Balance training;Patient/family education;Manual techniques;Dry needling;Passive range of motion;Taping;Vasopneumatic Device    PT Next Visit Plan Assess response to HEP and progress PRN, progress PROM    PT Home Exercise Plan [email or web address]    Consulted and Agree with Plan of Care Patient           Patient will benefit from skilled therapeutic intervention in order to improve the following deficits and impairments:  Decreased range of motion,Obesity,Decreased activity tolerance,Pain,Decreased strength,Decreased balance,Difficulty walking,Decreased knowledge of precautions,Impaired UE functional use  Visit Diagnosis: Right shoulder pain, unspecified chronicity  Muscle weakness  Decreased range of motion of left ankle     Problem List Patient Active Problem List   Diagnosis Date Noted  . Complete tear of right rotator cuff   . Subluxation of tendon of long head of biceps   . Surgery, elective 05/09/2020  . Pre-operative clearance 04/23/2020  . Gastrocnemius strain, left 03/23/2020  . Venous incompetence 12/28/2019  . Exertional dyspnea 11/08/2019  . Chronic right shoulder pain 09/02/2019  . Diabetic neuropathy (HCC) 08/16/2019  . Chest pain of uncertain etiology 04/27/2019  . CAD -S/P PCI 01/02/2019  . Obesity (BMI 30-39.9) 01/02/2019  . Orthostatic dizziness 01/02/2019  . Hyperlipidemia LDL goal <70 12/25/2018  . History of non-ST elevation myocardial infarction (NSTEMI) 12/23/2018  . Type 2 diabetes mellitus with complication (HCC)   . Tachycardia   .  Hypertension     Joellyn Rued MS, PT 07/05/20 11:20 AM  [facility] [address] [place], [place], [address] Phone: [phone]   Fax:  [phone]  Name: Yahir Tavano MRN: [id number] Date of Birth: Feb 11, 1956

## 2020-07-09 ENCOUNTER — Telehealth: Payer: Self-pay

## 2020-07-09 ENCOUNTER — Other Ambulatory Visit: Payer: Self-pay | Admitting: Family Medicine

## 2020-07-09 NOTE — Telephone Encounter (Signed)
[name] calls nurse line requesting refill on pregabalin. Patient has been taking as prescribed (75 mg in the morning and afternoon and 150 mg at bedtime) Medication is not on current medication list.   Please advise if refill can be sent to pharmacy.   Daughter is also asking when father should get colonoscopy. Patient had recent surgery on shoulder on 4/18 and has voiced concerns in the past with colonoscopy due to concerns with heart.   Veronda Prude, RN

## 2020-07-10 ENCOUNTER — Other Ambulatory Visit: Payer: Self-pay | Admitting: Family Medicine

## 2020-07-10 MED ORDER — PREGABALIN 75 MG PO CAPS: 75.0000 mg | ORAL_CAPSULE | Freq: Three times a day (TID) | ORAL | 1 refills | Status: DC

## 2020-07-10 NOTE — Telephone Encounter (Signed)
[name] returns call to nurse line. She is requesting refill as soon as possible as father is in a lot of pain from being without medication.   Veronda Prude, RN

## 2020-07-10 NOTE — Telephone Encounter (Signed)
It appears patient's pregabalin was canceled recently by another provider when they ordered gabapentin for hiccups.  I am not sure why this happened.  Pregabalin is also shown to be effective in hiccups.  Patient was taking pregabalin for his diabetic neuropathy.  He had previously tried gabapentin for this condition and did not respond well to it.  He had been responding well to the pregabalin though.  I have resent his prescription for pregabalin at 75 mg 3 times daily which was his previous dose.  Please notify the patient that I have sent this in and please tell him to not take gabapentin if he has any at his house.  He can discard those.

## 2020-07-12 ENCOUNTER — Ambulatory Visit: Payer: 59

## 2020-07-12 ENCOUNTER — Other Ambulatory Visit: Payer: Self-pay

## 2020-07-12 DIAGNOSIS — M6281 Muscle weakness (generalized): Secondary | ICD-10-CM

## 2020-07-12 DIAGNOSIS — R262 Difficulty in walking, not elsewhere classified: Secondary | ICD-10-CM

## 2020-07-12 DIAGNOSIS — M25511 Pain in right shoulder: Secondary | ICD-10-CM | POA: Diagnosis not present

## 2020-07-12 DIAGNOSIS — M25672 Stiffness of left ankle, not elsewhere classified: Secondary | ICD-10-CM

## 2020-07-12 NOTE — Therapy (Signed)
[facility] Outpatient Rehabilitation [facility] [address] [place], [place], [address] Phone: [phone]   Fax:  [phone]  Physical Therapy Treatment  Patient Details  Name: Keith Newton MRN: [id number] Date of Birth: 08-28-1955 Referring Provider (PT): August Saucer Corrie Mckusick, MD   Encounter Date: 07/12/2020   PT End of Session - 07/12/20 0941    Visit Number 3    Number of Visits 16    Date for PT Re-Evaluation 05/11/20    Authorization Type [employer]    Authorization - Visit Number 2    Authorization - Number of Visits 30    PT Start Time 0940    PT Stop Time 1020    PT Time Calculation (min) 40 min    Activity Tolerance Patient tolerated treatment well;Patient limited by pain    Behavior During Therapy [facility] for tasks assessed/performed           Past Medical History:  Diagnosis Date  . Arthritis   . Back pain   . CAD (coronary artery disease)    a. s/p NSTEMI in 12/2018 with 80% Prox-RCA stenosis followed by 100% prox to mid-RCA stenosis --> DESx2 to the RCA. Med management of residual disease.   . Carpal tunnel syndrome   . DDD (degenerative disc disease)   . Diabetes mellitus    type 2  . Hyperlipidemia   . Hypertension    states very mild  . Low testosterone   . Myocardial infarction (HCC)   . Overweight   . Seasonal allergies   . Tachycardia     Past Surgical History:  Procedure Laterality Date  . CARDIAC CATHETERIZATION    . CARPAL TUNNEL RELEASE  07/02/2011   Procedure: CARPAL TUNNEL RELEASE;  Surgeon: Wyn Forster., MD;  Location: [facility];  Service: Orthopedics;  Laterality: Right;  . CARPAL TUNNEL RELEASE  07/30/2011   Procedure: CARPAL TUNNEL RELEASE;  Surgeon: Wyn Forster., MD;  Location: [facility];  Service: Orthopedics;  Laterality: Left;  . CORONARY ANGIOPLASTY    . CORONARY STENT INTERVENTION N/A 12/23/2018   Procedure: CORONARY STENT INTERVENTION;  Surgeon: Corky Crafts, MD;  Location: [facility] INVASIVE CV LAB;  Service: Cardiovascular;  Laterality: N/A;  . LEFT HEART CATH AND CORONARY ANGIOGRAPHY N/A 12/23/2018   Procedure: LEFT HEART CATH AND CORONARY ANGIOGRAPHY;  Surgeon: Corky Crafts, MD;  Location: [facility] INVASIVE CV LAB;  Service: Cardiovascular;  Laterality: N/A;  . LUMBAR EPIDURAL INJECTION     x2  . SHOULDER ARTHROSCOPY WITH OPEN ROTATOR CUFF REPAIR AND DISTAL CLAVICLE ACROMINECTOMY Right 06/03/2020   Procedure: right shoulder arthroscopy, biceps tenodesis, mini open rotator cuff tear repair subscap/supraspinatus, lateral debridement;  Surgeon: Cammy Copa, MD;  Location: [facility] OR;  Service: Orthopedics;  Laterality: Right;    There were no vitals filed for this visit.   Subjective Assessment - 07/12/20 0947    Subjective Pt reports using a weed eater yesterday which cause his R shoulder and arm to hurt significantly, 10/10. It was a o/10 prior to weed eating.    Pertinent History R/C repair (supraspinatus and subscap) with biceps tenodesis on 06/03/2020, heart attack 12/2020, DMT II    Limitations Lifting;Reading;Writing;House hold activities;Sitting    Patient Stated Goals get shoulder better    Currently in Pain? Yes    Pain Score 7     Pain Location Shoulder    Pain Orientation Right    Pain Descriptors / Indicators Aching  Pain Type Chronic pain    Pain Onset More than a month ago    Pain Frequency Intermittent              [facility] PT Assessment - 07/12/20 0001      PROM   Right Shoulder Flexion 140 Degrees    Right Shoulder ABduction 85 Degrees    Right Shoulder External Rotation 50 Degrees                         [facility] Adult PT Treatment/Exercise - 07/12/20 0001      Exercises   Exercises Shoulder      Shoulder Exercises: Seated   External Rotation PROM;10 reps   5 sec hold   External Rotation Limitations seated; table top support    Flexion PROM;10 reps   5 sec hold   Flexion Weight (lbs)  seated; table top support    Abduction PROM;10 reps   Scaption; 5 sec hold   ABduction Limitations seated; table top support    Other Seated Exercises grip-wash clothe squeeze 20x, 3 sec      Modalities   Modalities Cryotherapy      Cryotherapy   Number Minutes Cryotherapy 10 Minutes    Cryotherapy Location Shoulder   R   Type of Cryotherapy Ice pack      Manual Therapy   Passive ROM PROM to the R shoulder for flexion, abd and ER. Flexion was limited to flexion 140d, abd 85d, and ER 50d. PROM was limited to pt's pain tolerance                  PT Education - 07/12/20 1150    Education Details Pt was advised to limit the use of his R UE to his PT PROM program until otherwise advised by PT.    Person(s) Educated Patient    Methods Explanation    Comprehension Verbalized understanding            PT Short Term Goals - 06/28/20 1128      PT SHORT TERM GOAL #1   Title [name] will be >75% HEP compliant within 3 weeks to facilitate carryover between sessions and move towards eventual independent management of their condition.    Baseline Started on eval    Time 3    Period Weeks    Status New    Target Date 07/19/20             PT Long Term Goals - 06/28/20 1128      PT LONG TERM GOAL #1   Title [name] will achieve >=60 degrees of shoulder ER at 90 degrees to allow proper shoulder mechanics during OH movement.    Baseline 10 at neutral    Time 8    Period Weeks    Status New    Target Date 08/23/20      PT LONG TERM GOAL #2   Title [name] will demonstrate >130 degrees of active ROM, not limited by pain, in abduction and flexion to allow completion of activities involving reaching [facility]    Baseline 45 passive flexoin    Time 8    Period Weeks    Status New    Target Date 08/23/20      PT LONG TERM GOAL #3   Title [name] will be able to reach [facility] into cabinet, not limited by pain    Baseline unable    Time 8  Period Weeks    Status  New    Target Date 08/23/20      PT LONG TERM GOAL #4   Title Bradly ChrisGrady Bruck will be able to resume driving, not limited by pain    Baseline unable    Time 8    Period Weeks    Status New    Target Date 08/23/20      PT LONG TERM GOAL #5   Title Bradly ChrisGrady Diem will be compliant with post op precautions throughout therapy.    Baseline non compliant    Time 8    Period Weeks    Status New    Target Date 08/23/20      PT LONG TERM GOAL #6   Title Bradly ChrisGrady Monie will improve FOTO score from 30 to 66 as a proxy for functional improvement    Time 8    Period Weeks    Status New    Target Date 08/23/20                 Plan - 07/12/20 0943    Clinical Impression Statement Pt presents with increased R shoulder pain after trying to weed eat his yard yesterday. He initailly had a significant increase in R shoulder pain 10+/10, however today he rates his pain a 7/10. Prior to trying to weed eat his yard, he reports he was not having any R shoulder pain. Pt was advised not to use his R shoulder more than the HEP he has been provided. PT was completed for PROM exs provided by this PT and ther ex by the pt. Pt was able to complete the exs without adverse effects, and reported a reduction in pain to 4/10 following the exs. Pt's R shoulder motion was consistent with time frame post surgery and making appropriate progress. A cold pack was applied after therexs for symptom management.    Personal Factors and Comorbidities Fitness;Comorbidity 3+;Behavior Pattern    Comorbidities obesity, CAD, HTN, tachycardia, DM, HA    Examination-Activity Limitations Bathing;Carry;Dressing;Lift;Reach Overhead    Examination-Participation Restrictions Cleaning;Community Activity;Driving    Stability/Clinical Decision Making Stable/Uncomplicated    Clinical Decision Making Low    Rehab Potential Good    PT Frequency 2x / week    PT Duration 8 weeks    PT Treatment/Interventions ADLs/Self Care Home  Management;Cryotherapy;Electrical Stimulation;Ultrasound;Moist Heat;Iontophoresis 4mg /ml Dexamethasone;Gait training;Stair training;Functional mobility training;Therapeutic activities;Therapeutic exercise;Balance training;Patient/family education;Manual techniques;Dry needling;Passive range of motion;Taping;Vasopneumatic Device;Canalith Repostioning    PT Next Visit Plan Progress PROM. Initaited AAROM, gentle AROM, and isometrics for the periscapular, pectorals, lats, and traps    PT Home Exercise Plan [email or web address]    Consulted and Agree with Plan of Care Patient           Patient will benefit from skilled therapeutic intervention in order to improve the following deficits and impairments:  Decreased range of motion,Obesity,Decreased activity tolerance,Pain,Decreased strength,Decreased balance,Difficulty walking,Decreased knowledge of precautions,Impaired UE functional use  Visit Diagnosis: Right shoulder pain, unspecified chronicity  Muscle weakness  Decreased range of motion of left ankle  Difficulty in walking, not elsewhere classified     Problem List Patient Active Problem List   Diagnosis Date Noted  . Complete tear of right rotator cuff   . Subluxation of tendon of long head of biceps   . Surgery, elective 05/09/2020  . Pre-operative clearance 04/23/2020  . Gastrocnemius strain, left 03/23/2020  . Venous incompetence 12/28/2019  . Exertional dyspnea 11/08/2019  . Chronic right shoulder pain 09/02/2019  .  Diabetic neuropathy (HCC) 08/16/2019  . Chest pain of uncertain etiology 04/27/2019  . CAD -S/P PCI 01/02/2019  . Obesity (BMI 30-39.9) 01/02/2019  . Orthostatic dizziness 01/02/2019  . Hyperlipidemia LDL goal <70 12/25/2018  . History of non-ST elevation myocardial infarction (NSTEMI) 12/23/2018  . Type 2 diabetes mellitus with complication (HCC)   . Tachycardia   . Hypertension     Joellyn Rued MS, PT 07/12/20 12:28 PM  [facility] Outpatient Rehabilitation  [facility] [address] [place], [place], [address] Phone: [phone]   Fax:  [phone]  Name: Graceson Nichelson MRN: [id number] Date of Birth: 03/08/1955

## 2020-07-16 ENCOUNTER — Ambulatory Visit: Payer: 59

## 2020-07-16 ENCOUNTER — Other Ambulatory Visit: Payer: Self-pay

## 2020-07-16 DIAGNOSIS — M25511 Pain in right shoulder: Secondary | ICD-10-CM | POA: Diagnosis not present

## 2020-07-16 DIAGNOSIS — M25672 Stiffness of left ankle, not elsewhere classified: Secondary | ICD-10-CM

## 2020-07-16 DIAGNOSIS — M6281 Muscle weakness (generalized): Secondary | ICD-10-CM

## 2020-07-16 NOTE — Therapy (Signed)
[facility] Outpatient Rehabilitation [facility] [address] [place], [place], [address] Phone: [phone]   Fax:  [phone]  Physical Therapy Treatment  Patient Details  Name: Keith Newton MRN: [id number] Date of Birth: 11/23/1955 Referring Provider (PT): August Saucer Corrie Mckusick, MD   Encounter Date: 07/16/2020   PT End of Session - 07/16/20 0809    Visit Number 4    Number of Visits 16    Date for PT Re-Evaluation 08/23/20    Authorization Type [employer]    Authorization - Visit Number 3    Authorization - Number of Visits 30    Progress Note Due on Visit 10    PT Start Time 0810    PT Stop Time 0859    PT Time Calculation (min) 49 min    Activity Tolerance Patient tolerated treatment well;Patient limited by pain    Behavior During Therapy [facility] for tasks assessed/performed           Past Medical History:  Diagnosis Date  . Arthritis   . Back pain   . CAD (coronary artery disease)    a. s/p NSTEMI in 12/2018 with 80% Prox-RCA stenosis followed by 100% prox to mid-RCA stenosis --> DESx2 to the RCA. Med management of residual disease.   . Carpal tunnel syndrome   . DDD (degenerative disc disease)   . Diabetes mellitus    type 2  . Hyperlipidemia   . Hypertension    states very mild  . Low testosterone   . Myocardial infarction (HCC)   . Overweight   . Seasonal allergies   . Tachycardia     Past Surgical History:  Procedure Laterality Date  . CARDIAC CATHETERIZATION    . CARPAL TUNNEL RELEASE  07/02/2011   Procedure: CARPAL TUNNEL RELEASE;  Surgeon: Wyn Forster., MD;  Location: [facility];  Service: Orthopedics;  Laterality: Right;  . CARPAL TUNNEL RELEASE  07/30/2011   Procedure: CARPAL TUNNEL RELEASE;  Surgeon: Wyn Forster., MD;  Location: [facility];  Service: Orthopedics;  Laterality: Left;  . CORONARY ANGIOPLASTY    . CORONARY STENT INTERVENTION N/A 12/23/2018   Procedure: CORONARY STENT  INTERVENTION;  Surgeon: Corky Crafts, MD;  Location: [facility] INVASIVE CV LAB;  Service: Cardiovascular;  Laterality: N/A;  . LEFT HEART CATH AND CORONARY ANGIOGRAPHY N/A 12/23/2018   Procedure: LEFT HEART CATH AND CORONARY ANGIOGRAPHY;  Surgeon: Corky Crafts, MD;  Location: [facility] INVASIVE CV LAB;  Service: Cardiovascular;  Laterality: N/A;  . LUMBAR EPIDURAL INJECTION     x2  . SHOULDER ARTHROSCOPY WITH OPEN ROTATOR CUFF REPAIR AND DISTAL CLAVICLE ACROMINECTOMY Right 06/03/2020   Procedure: right shoulder arthroscopy, biceps tenodesis, mini open rotator cuff tear repair subscap/supraspinatus, lateral debridement;  Surgeon: Cammy Copa, MD;  Location: [facility] OR;  Service: Orthopedics;  Laterality: Right;    There were no vitals filed for this visit.   Subjective Assessment - 07/16/20 0815    Subjective Overall, better since last session but woke up today with pain that went down to his R forearm. Yesterday he experienced very little pain.    Pertinent History R/C repair (supraspinatus and subscap) with biceps tenodesis on 06/03/2020, heart attack 12/2020, DMT II    Limitations Lifting;Reading;Writing;House hold activities;Sitting    Patient Stated Goals get shoulder better    Currently in Pain? Yes    Pain Score 8     Pain Location Shoulder    Pain Orientation Right  Pain Descriptors / Indicators Aching    Pain Type Chronic pain    Pain Radiating Towards to R forearm    Pain Onset 1 to 4 weeks ago    Pain Frequency Intermittent    Aggravating Factors  Movement, sleping position    Pain Relieving Factors Ice, topical    Effect of Pain on Daily Activities anything involving lifting, reaching, lifting, writing                             [facility] Adult PT Treatment/Exercise - 07/16/20 0001      Exercises   Exercises Shoulder      Shoulder Exercises: Pulleys   Flexion 2 minutes    Flexion Limitations as tolerated    Scaption 2 minutes    Scaption  Limitations as tolerated      Shoulder Exercises: Isometric Strengthening   Extension 5X5"    Extension Limitations sub-max as tolerted    External Rotation 5X5"    External Rotation Limitations sub-max as tolerted    Internal Rotation 5X5"    Internal Rotation Limitations sub-max as tolerted    ABduction 5X5"    ABduction Limitations sub-max as tolerted      Modalities   Modalities Cryotherapy      Cryotherapy   Number Minutes Cryotherapy 10 Minutes    Cryotherapy Location Shoulder    Type of Cryotherapy Ice pack                  PT Education - 07/16/20 0904    Education Details Updated shoulder HEP for sub max isometrics and to progress AAROM exs to ROMs as tolerated    Person(s) Educated Patient    Methods Explanation;Demonstration;Tactile cues;Verbal cues;Handout    Comprehension Verbalized understanding;Returned demonstration;Verbal cues required;Tactile cues required            PT Short Term Goals - 06/28/20 1128      PT SHORT TERM GOAL #1   Title [name] will be >75% HEP compliant within 3 weeks to facilitate carryover between sessions and move towards eventual independent management of their condition.    Baseline Started on eval    Time 3    Period Weeks    Status New    Target Date 07/19/20             PT Long Term Goals - 06/28/20 1128      PT LONG TERM GOAL #1   Title [name] will achieve >=60 degrees of shoulder ER at 90 degrees to allow proper shoulder mechanics during OH movement.    Baseline 10 at neutral    Time 8    Period Weeks    Status New    Target Date 08/23/20      PT LONG TERM GOAL #2   Title [name] will demonstrate >130 degrees of active ROM, not limited by pain, in abduction and flexion to allow completion of activities involving reaching [facility]    Baseline 45 passive flexoin    Time 8    Period Weeks    Status New    Target Date 08/23/20      PT LONG TERM GOAL #3   Title [name] will be able to  reach [facility] into cabinet, not limited by pain    Baseline unable    Time 8    Period Weeks    Status New    Target Date 08/23/20  PT LONG TERM GOAL #4   Title Kyndall Chaplin will be able to resume driving, not limited by pain    Baseline unable    Time 8    Period Weeks    Status New    Target Date 08/23/20      PT LONG TERM GOAL #5   Title Chris Cripps will be compliant with post op precautions throughout therapy.    Baseline non compliant    Time 8    Period Weeks    Status New    Target Date 08/23/20      PT LONG TERM GOAL #6   Title Arno Cullers will improve FOTO score from 30 to 66 as a proxy for functional improvement    Time 8    Period Weeks    Status New    Target Date 08/23/20                 Plan - 07/16/20 7654    Clinical Impression Statement Pt presented with increased R shoulder pain with pain to the R forearm which he woke up with this AM. Yesterday he reports experiencing little R shoulder pain. Reviewed sleeping positons, Pt reports sleeping on his L side with his R arm supported along his body which would be appropriate. Cervical movements did not provoke the pt's R shoulder or forearm pain. Pts PT program was progress to [facility] exs as tolerated and sub-max R shoulder isomentrics whcich were added to the pt's HEP.Marland Kitchen A cold pack was applied after ther ex for symptom management. After the session pt reported his R shoulder felt much better.    Personal Factors and Comorbidities Fitness;Comorbidity 3+;Behavior Pattern    Comorbidities obesity, CAD, HTN, tachycardia, DM, HA    Examination-Activity Limitations Bathing;Carry;Dressing;Lift;Reach Overhead    Examination-Participation Restrictions Cleaning;Community Activity;Driving    Stability/Clinical Decision Making Stable/Uncomplicated    Clinical Decision Making Low    Rehab Potential Good    PT Frequency 2x / week    PT Duration 8 weeks    PT Treatment/Interventions ADLs/Self Care Home  Management;Cryotherapy;Electrical Stimulation;Ultrasound;Moist Heat;Iontophoresis 4mg /ml Dexamethasone;Gait training;Stair training;Functional mobility training;Therapeutic activities;Therapeutic exercise;Balance training;Patient/family education;Manual techniques;Dry needling;Passive range of motion;Taping;Vasopneumatic Device;Canalith Repostioning    PT Next Visit Plan Progress PROM. Assess response AAROM, gentle and isometrics for the periscapular, rotator cuff, lats, and traps    PT Home Exercise Plan [email or web address]    Consulted and Agree with Plan of Care Patient           Patient will benefit from skilled therapeutic intervention in order to improve the following deficits and impairments:  Decreased range of motion,Obesity,Decreased activity tolerance,Pain,Decreased strength,Decreased balance,Difficulty walking,Decreased knowledge of precautions,Impaired UE functional use  Visit Diagnosis: Right shoulder pain, unspecified chronicity  Muscle weakness  Decreased range of motion of left ankle     Problem List Patient Active Problem List   Diagnosis Date Noted  . Complete tear of right rotator cuff   . Subluxation of tendon of long head of biceps   . Surgery, elective 05/09/2020  . Pre-operative clearance 04/23/2020  . Gastrocnemius strain, left 03/23/2020  . Venous incompetence 12/28/2019  . Exertional dyspnea 11/08/2019  . Chronic right shoulder pain 09/02/2019  . Diabetic neuropathy (HCC) 08/16/2019  . Chest pain of uncertain etiology 04/27/2019  . CAD -S/P PCI 01/02/2019  . Obesity (BMI 30-39.9) 01/02/2019  . Orthostatic dizziness 01/02/2019  . Hyperlipidemia LDL goal <70 12/25/2018  . History of non-ST elevation myocardial infarction (NSTEMI) 12/23/2018  . Type 2  diabetes mellitus with complication (HCC)   . Tachycardia   . Hypertension     Joellyn RuedAllen Vernita Tague MS, PT 07/16/20 9:22 AM   [facility] [address] [place], [place], [address] Phone: [phone]   Fax:  [phone]  Name: Bradly ChrisGrady Pavelko MRN: [id number] Date of Birth: 29-Jul-1955

## 2020-07-19 ENCOUNTER — Ambulatory Visit: Payer: 59 | Attending: Family Medicine | Admitting: Physical Therapy

## 2020-07-19 ENCOUNTER — Telehealth: Payer: Self-pay | Admitting: Orthopedic Surgery

## 2020-07-19 ENCOUNTER — Encounter: Payer: Self-pay | Admitting: Physical Therapy

## 2020-07-19 ENCOUNTER — Other Ambulatory Visit: Payer: Self-pay

## 2020-07-19 DIAGNOSIS — M6281 Muscle weakness (generalized): Secondary | ICD-10-CM | POA: Diagnosis present

## 2020-07-19 DIAGNOSIS — M79662 Pain in left lower leg: Secondary | ICD-10-CM | POA: Diagnosis present

## 2020-07-19 DIAGNOSIS — R262 Difficulty in walking, not elsewhere classified: Secondary | ICD-10-CM | POA: Diagnosis present

## 2020-07-19 DIAGNOSIS — M25511 Pain in right shoulder: Secondary | ICD-10-CM | POA: Diagnosis present

## 2020-07-19 DIAGNOSIS — R293 Abnormal posture: Secondary | ICD-10-CM | POA: Diagnosis present

## 2020-07-19 DIAGNOSIS — M25672 Stiffness of left ankle, not elsewhere classified: Secondary | ICD-10-CM | POA: Insufficient documentation

## 2020-07-19 NOTE — Telephone Encounter (Signed)
I would not add anything to his current pain regimen and he is supposed to come back 4 weeks from his prior visit

## 2020-07-19 NOTE — Telephone Encounter (Signed)
Pls advise.  

## 2020-07-19 NOTE — Telephone Encounter (Signed)
Pt called stating the PT is concerned about the pts pain levels and is wanting to know if there is anything else that can be done? Pts daughter Kirk Ruths is wanting to know when the pt needs to follow up with Dr.Dean and would like to discuss this during the CB as well.   [phone]

## 2020-07-19 NOTE — Therapy (Signed)
[facility] Outpatient Rehabilitation [facility] [address] [place], [place], [address] Phone: [phone]   Fax:  [phone]  Physical Therapy Treatment  Patient Details  Name: Keith Newton MRN: [id number] Date of Birth: 08-14-1955 Referring Provider (PT): August Saucer Corrie Mckusick, MD   Encounter Date: 07/19/2020   PT End of Session - 07/19/20 0928    Visit Number 5    Number of Visits 16    Date for PT Re-Evaluation 08/23/20    Authorization Type [employer]    Authorization - Visit Number 5    Authorization - Number of Visits 30    Progress Note Due on Visit 10    PT Start Time [phone]   pt arrived late   PT Stop Time 0959    PT Time Calculation (min) 31 min    Activity Tolerance Patient tolerated treatment well;Patient limited by pain    Behavior During Therapy [facility] for tasks assessed/performed           Past Medical History:  Diagnosis Date  . Arthritis   . Back pain   . CAD (coronary artery disease)    a. s/p NSTEMI in 12/2018 with 80% Prox-RCA stenosis followed by 100% prox to mid-RCA stenosis --> DESx2 to the RCA. Med management of residual disease.   . Carpal tunnel syndrome   . DDD (degenerative disc disease)   . Diabetes mellitus    type 2  . Hyperlipidemia   . Hypertension    states very mild  . Low testosterone   . Myocardial infarction (HCC)   . Overweight   . Seasonal allergies   . Tachycardia     Past Surgical History:  Procedure Laterality Date  . CARDIAC CATHETERIZATION    . CARPAL TUNNEL RELEASE  07/02/2011   Procedure: CARPAL TUNNEL RELEASE;  Surgeon: Wyn Forster., MD;  Location: [facility];  Service: Orthopedics;  Laterality: Right;  . CARPAL TUNNEL RELEASE  07/30/2011   Procedure: CARPAL TUNNEL RELEASE;  Surgeon: Wyn Forster., MD;  Location: [facility];  Service: Orthopedics;  Laterality: Left;  . CORONARY ANGIOPLASTY    . CORONARY STENT INTERVENTION N/A 12/23/2018   Procedure:  CORONARY STENT INTERVENTION;  Surgeon: Corky Crafts, MD;  Location: [facility] INVASIVE CV LAB;  Service: Cardiovascular;  Laterality: N/A;  . LEFT HEART CATH AND CORONARY ANGIOGRAPHY N/A 12/23/2018   Procedure: LEFT HEART CATH AND CORONARY ANGIOGRAPHY;  Surgeon: Corky Crafts, MD;  Location: [facility] INVASIVE CV LAB;  Service: Cardiovascular;  Laterality: N/A;  . LUMBAR EPIDURAL INJECTION     x2  . SHOULDER ARTHROSCOPY WITH OPEN ROTATOR CUFF REPAIR AND DISTAL CLAVICLE ACROMINECTOMY Right 06/03/2020   Procedure: right shoulder arthroscopy, biceps tenodesis, mini open rotator cuff tear repair subscap/supraspinatus, lateral debridement;  Surgeon: Cammy Copa, MD;  Location: [facility] OR;  Service: Orthopedics;  Laterality: Right;    There were no vitals filed for this visit.   Subjective Assessment - 07/19/20 0928    Subjective Pt reports that he he woke up hurting.  He put bio-freeze and took a pain pill.  he is not sure why it's been hurting.    Pertinent History R/C repair (supraspinatus and subscap) with biceps tenodesis on 06/03/2020, heart attack 12/2020, DMT II    Limitations Lifting;Reading;Writing;House hold activities;Sitting    Patient Stated Goals get shoulder better    Currently in Pain? Yes    Pain Score 8     Pain Location Shoulder  Pain Onset 1 to 4 weeks ago                             [facility] Adult PT Treatment/Exercise - 07/19/20 0001      Shoulder Exercises: Supine   Other Supine Exercises aarom - external rotation      Shoulder Exercises: Pulleys   Flexion 2 minutes    Flexion Limitations as tolerated    Scaption 2 minutes    Scaption Limitations as tolerated      Shoulder Exercises: Isometric Strengthening   Extension 5X5"    Extension Limitations sub-max as tolerted    External Rotation 5X5"    External Rotation Limitations sub-max as tolerted    Internal Rotation 5X5"    Internal Rotation Limitations sub-max as tolerted    ABduction 5X5"     ABduction Limitations sub-max as tolerted      Manual Therapy   Passive ROM PROM flexion, scaption, and ER with pt in supine                  PT Education - 07/19/20 1001    Education Details updated HEP to include ER AAROM with dowel    Person(s) Educated Patient    Methods Explanation;Demonstration    Comprehension Verbalized understanding;Returned demonstration            PT Short Term Goals - 06/28/20 1128      PT SHORT TERM GOAL #1   Title Keith Newton will be >75% HEP compliant within 3 weeks to facilitate carryover between sessions and move towards eventual independent management of their condition.    Baseline Started on eval    Time 3    Period Weeks    Status New    Target Date 07/19/20             PT Long Term Goals - 06/28/20 1128      PT LONG TERM GOAL #1   Title Keith Newton will achieve >=60 degrees of shoulder ER at 90 degrees to allow proper shoulder mechanics during OH movement.    Baseline 10 at neutral    Time 8    Period Weeks    Status New    Target Date 08/23/20      PT LONG TERM GOAL #2   Title Keith Newton will demonstrate >130 degrees of active ROM, not limited by pain, in abduction and flexion to allow completion of activities involving reaching [facility]    Baseline 45 passive flexoin    Time 8    Period Weeks    Status New    Target Date 08/23/20      PT LONG TERM GOAL #3   Title Keith Newton will be able to reach [facility] into cabinet, not limited by pain    Baseline unable    Time 8    Period Weeks    Status New    Target Date 08/23/20      PT LONG TERM GOAL #4   Title Keith Newton will be able to resume driving, not limited by pain    Baseline unable    Time 8    Period Weeks    Status New    Target Date 08/23/20      PT LONG TERM GOAL #5   Title Keith Newton will be compliant with post op precautions throughout therapy.    Baseline non compliant    Time 8  Period Weeks    Status New    Target Date 08/23/20       PT LONG TERM GOAL #6   Title Keith Newton will improve FOTO score from 30 to 66 as a proxy for functional improvement    Time 8    Period Weeks    Status New    Target Date 08/23/20                 Plan - 07/19/20 1002    Clinical Impression Statement Pt presents with higher than expected pain at this point.  Passively he is moving to about 120 degrees of flexoin with a high level of pain.  It is questionable if he has been compliant with precuations.  I advised that he should follow up with MD and he agrees to plan.  Will progress with AAROM and isometics as appropriate.    Personal Factors and Comorbidities Fitness;Comorbidity 3+;Behavior Pattern    Comorbidities obesity, CAD, HTN, tachycardia, DM, HA    Examination-Activity Limitations Bathing;Carry;Dressing;Lift;Reach Overhead    Examination-Participation Restrictions Cleaning;Community Activity;Driving    Stability/Clinical Decision Making Stable/Uncomplicated    Rehab Potential Good    PT Frequency 2x / week    PT Duration 8 weeks    PT Treatment/Interventions ADLs/Self Care Home Management;Cryotherapy;Electrical Stimulation;Ultrasound;Moist Heat;Iontophoresis 4mg /ml Dexamethasone;Gait training;Stair training;Functional mobility training;Therapeutic activities;Therapeutic exercise;Balance training;Patient/family education;Manual techniques;Dry needling;Passive range of motion;Taping;Vasopneumatic Device;Canalith Repostioning    PT Next Visit Plan Progress PROM. Assess response AAROM, gentle and isometrics for the periscapular, rotator cuff, lats, and traps    PT Home Exercise Plan [email or web address]    Consulted and Agree with Plan of Care Patient           Patient will benefit from skilled therapeutic intervention in order to improve the following deficits and impairments:  Decreased range of motion,Obesity,Decreased activity tolerance,Pain,Decreased strength,Decreased balance,Difficulty walking,Decreased knowledge of  precautions,Impaired UE functional use  Visit Diagnosis: Right shoulder pain, unspecified chronicity  Muscle weakness  Decreased range of motion of left ankle  Difficulty in walking, not elsewhere classified  Muscle weakness (generalized)  Pain of left calf     Problem List Patient Active Problem List   Diagnosis Date Noted  . Complete tear of right rotator cuff   . Subluxation of tendon of long head of biceps   . Surgery, elective 05/09/2020  . Pre-operative clearance 04/23/2020  . Gastrocnemius strain, left 03/23/2020  . Venous incompetence 12/28/2019  . Exertional dyspnea 11/08/2019  . Chronic right shoulder pain 09/02/2019  . Diabetic neuropathy (HCC) 08/16/2019  . Chest pain of uncertain etiology 04/27/2019  . CAD -S/P PCI 01/02/2019  . Obesity (BMI 30-39.9) 01/02/2019  . Orthostatic dizziness 01/02/2019  . Hyperlipidemia LDL goal <70 12/25/2018  . History of non-ST elevation myocardial infarction (NSTEMI) 12/23/2018  . Type 2 diabetes mellitus with complication (HCC)   . Tachycardia   . Hypertension     13/07/2018 PT, DPT 07/19/20 10:10 AM  [facility] Outpatient Rehabilitation [facility] [address] [place], [place], [place] Phone: [phone]   Fax:  [phone]  Name: Keith Newton MRN: Keith Newton Date of Birth: [birth year]

## 2020-07-22 NOTE — Telephone Encounter (Signed)
[facility] no follow up appt had been scheduled. I put them on the schedule for this Friday which is approximately 4 week follow up from his last appt.

## 2020-07-23 ENCOUNTER — Encounter: Payer: Self-pay | Admitting: Physical Therapy

## 2020-07-23 ENCOUNTER — Ambulatory Visit: Payer: 59 | Admitting: Physical Therapy

## 2020-07-23 ENCOUNTER — Other Ambulatory Visit: Payer: Self-pay

## 2020-07-23 DIAGNOSIS — R262 Difficulty in walking, not elsewhere classified: Secondary | ICD-10-CM

## 2020-07-23 DIAGNOSIS — M25672 Stiffness of left ankle, not elsewhere classified: Secondary | ICD-10-CM

## 2020-07-23 DIAGNOSIS — M25511 Pain in right shoulder: Secondary | ICD-10-CM

## 2020-07-23 DIAGNOSIS — M6281 Muscle weakness (generalized): Secondary | ICD-10-CM

## 2020-07-23 NOTE — Therapy (Addendum)
[facility] [address] [place], [place], [address] Phone: [phone]   Fax:  [phone]  Physical Therapy Treatment  Patient Details  Name: Keith Newton MRN: [id number] Date of Birth: Jul 02, 1955 Referring Provider (PT): August Saucer Corrie Mckusick, MD   Encounter Date: 07/23/2020   PT End of Session - 07/23/20 0923    Visit Number 6    Number of Visits 16    Date for PT Re-Evaluation 08/23/20    Authorization Type [employer]    Authorization - Visit Number 6    Authorization - Number of Visits 30    Progress Note Due on Visit 10    PT Start Time [phone]   pt arrived late   PT Stop Time 0955    PT Time Calculation (min) 31 min    Activity Tolerance Patient tolerated treatment well;Patient limited by pain    Behavior During Therapy [facility] for tasks assessed/performed           Past Medical History:  Diagnosis Date  . Arthritis   . Back pain   . CAD (coronary artery disease)    a. s/p NSTEMI in 12/2018 with 80% Prox-RCA stenosis followed by 100% prox to mid-RCA stenosis --> DESx2 to the RCA. Med management of residual disease.   . Carpal tunnel syndrome   . DDD (degenerative disc disease)   . Diabetes mellitus    type 2  . Hyperlipidemia   . Hypertension    states very mild  . Low testosterone   . Myocardial infarction (HCC)   . Overweight   . Seasonal allergies   . Tachycardia     Past Surgical History:  Procedure Laterality Date  . CARDIAC CATHETERIZATION    . CARPAL TUNNEL RELEASE  07/02/2011   Procedure: CARPAL TUNNEL RELEASE;  Surgeon: Wyn Forster., MD;  Location: [facility];  Service: Orthopedics;  Laterality: Right;  . CARPAL TUNNEL RELEASE  07/30/2011   Procedure: CARPAL TUNNEL RELEASE;  Surgeon: Wyn Forster., MD;  Location: [facility];  Service: Orthopedics;  Laterality: Left;  . CORONARY ANGIOPLASTY    . CORONARY STENT INTERVENTION N/A 12/23/2018   Procedure:  CORONARY STENT INTERVENTION;  Surgeon: Corky Crafts, MD;  Location: [facility] INVASIVE CV LAB;  Service: Cardiovascular;  Laterality: N/A;  . LEFT HEART CATH AND CORONARY ANGIOGRAPHY N/A 12/23/2018   Procedure: LEFT HEART CATH AND CORONARY ANGIOGRAPHY;  Surgeon: Corky Crafts, MD;  Location: [facility] INVASIVE CV LAB;  Service: Cardiovascular;  Laterality: N/A;  . LUMBAR EPIDURAL INJECTION     x2  . SHOULDER ARTHROSCOPY WITH OPEN ROTATOR CUFF REPAIR AND DISTAL CLAVICLE ACROMINECTOMY Right 06/03/2020   Procedure: right shoulder arthroscopy, biceps tenodesis, mini open rotator cuff tear repair subscap/supraspinatus, lateral debridement;  Surgeon: Cammy Copa, MD;  Location: [facility] OR;  Service: Orthopedics;  Laterality: Right;    There were no vitals filed for this visit.   Subjective Assessment - 07/23/20 0925    Subjective Pt reports that his pain is gradually improving.  He feels the most pain at night he feels his shoulder is mostly weak and he is using his L arm for most activities.    Pertinent History R/C repair (supraspinatus and subscap) with biceps tenodesis on 06/03/2020, heart attack 12/2020, DMT II    Limitations Lifting;Reading;Writing;House hold activities;Sitting    Patient Stated Goals get shoulder better    Currently in Pain? Yes    Pain Score 6  Pain Location Shoulder    Pain Orientation Right    Pain Onset 1 to 4 weeks ago              [facility] PT Assessment - 07/23/20 0001      Observation/Other Assessments   Focus on Therapeutic Outcomes (FOTO)  42      PROM   Right Shoulder Flexion 108 Degrees   AAROM                        [facility] Adult PT Treatment/Exercise - 07/23/20 0001      Therapeutic Activites    Therapeutic Activities Other Therapeutic Activities    Other Therapeutic Activities administering FOTO to measure progress      Shoulder Exercises: Standing   Extension Limitations yellow TB - 3x10    Row Limitations yellow TB - 3x10       Shoulder Exercises: Pulleys   Flexion 2 minutes    Flexion Limitations as tolerated    Scaption 2 minutes    Scaption Limitations as tolerated      Shoulder Exercises: Therapy Ball   Other Therapy Ball Exercises rol up wedge for AAROM - 2x15      Shoulder Exercises: ROM/Strengthening   UBE (Upper Arm Bike) 4' - AAROM using L arm to drive      Shoulder Exercises: Isometric Strengthening   Internal Rotation Limitations 5''x10 sub-max as tolerted    ABduction Limitations 5''x10 sub-max as tolerted                    PT Short Term Goals - 06/28/20 1128      PT SHORT TERM GOAL #1   Title Keith Newton will be >75% HEP compliant within 3 weeks to facilitate carryover between sessions and move towards eventual independent management of their condition.    Baseline Started on eval    Time 3    Period Weeks    Status New    Target Date 07/19/20             PT Long Term Goals - 06/28/20 1128      PT LONG TERM GOAL #1   Title Keith Newton will achieve >=60 degrees of shoulder ER at 90 degrees to allow proper shoulder mechanics during OH movement.    Baseline 10 at neutral    Time 8    Period Weeks    Status New    Target Date 08/23/20      PT LONG TERM GOAL #2   Title Keith Newton will demonstrate >130 degrees of active ROM, not limited by pain, in abduction and flexion to allow completion of activities involving reaching [facility]    Baseline 45 passive flexoin    Time 8    Period Weeks    Status New    Target Date 08/23/20      PT LONG TERM GOAL #3   Title Keith Newton will be able to reach [facility] into cabinet, not limited by pain    Baseline unable    Time 8    Period Weeks    Status New    Target Date 08/23/20      PT LONG TERM GOAL #4   Title Keith Newton will be able to resume driving, not limited by pain    Baseline unable    Time 8    Period Weeks    Status New    Target Date 08/23/20  PT LONG TERM GOAL #5   Title Keith Newton will be compliant  with post op precautions throughout therapy.    Baseline non compliant    Time 8    Period Weeks    Status New    Target Date 08/23/20      PT LONG TERM GOAL #6   Title Ad Guttman will improve FOTO score from 30 to 66 as a proxy for functional improvement    Time 8    Period Weeks    Status New    Target Date 08/23/20                 Plan - 07/23/20 0937    Clinical Impression Statement Pt remains limited by pain, intensity adjusted accordingly.  We will progress AAROM as able.  Added in some gentle rows today for scapular strenghtening which was tolerated well.  FOTO score improved from 30 to 42.    Personal Factors and Comorbidities Fitness;Comorbidity 3+;Behavior Pattern    Comorbidities obesity, CAD, HTN, tachycardia, DM, HA    Examination-Activity Limitations Bathing;Carry;Dressing;Lift;Reach Overhead    Examination-Participation Restrictions Cleaning;Community Activity;Driving    Stability/Clinical Decision Making Stable/Uncomplicated    Rehab Potential Good    PT Frequency 2x / week    PT Duration 8 weeks    PT Treatment/Interventions ADLs/Self Care Home Management;Cryotherapy;Electrical Stimulation;Ultrasound;Moist Heat;Iontophoresis 4mg /ml Dexamethasone;Gait training;Stair training;Functional mobility training;Therapeutic activities;Therapeutic exercise;Balance training;Patient/family education;Manual techniques;Dry needling;Passive range of motion;Taping;Vasopneumatic Device;Canalith Repostioning    PT Next Visit Plan Progress PROM. Assess response AAROM, gentle and isometrics for the periscapular, rotator cuff, lats, and traps    PT Home Exercise Plan [email or web address]    Consulted and Agree with Plan of Care Patient           Patient will benefit from skilled therapeutic intervention in order to improve the following deficits and impairments:  Decreased range of motion,Obesity,Decreased activity tolerance,Pain,Decreased strength,Decreased balance,Difficulty  walking,Decreased knowledge of precautions,Impaired UE functional use  Visit Diagnosis: Right shoulder pain, unspecified chronicity  Muscle weakness  Decreased range of motion of left ankle  Difficulty in walking, not elsewhere classified  Muscle weakness (generalized)     Problem List Patient Active Problem List   Diagnosis Date Noted  . Complete tear of right rotator cuff   . Subluxation of tendon of long head of biceps   . Surgery, elective 05/09/2020  . Pre-operative clearance 04/23/2020  . Gastrocnemius strain, left 03/23/2020  . Venous incompetence 12/28/2019  . Exertional dyspnea 11/08/2019  . Chronic right shoulder pain 09/02/2019  . Diabetic neuropathy (HCC) 08/16/2019  . Chest pain of uncertain etiology 04/27/2019  . CAD -S/P PCI 01/02/2019  . Obesity (BMI 30-39.9) 01/02/2019  . Orthostatic dizziness 01/02/2019  . Hyperlipidemia LDL goal <70 12/25/2018  . History of non-ST elevation myocardial infarction (NSTEMI) 12/23/2018  . Type 2 diabetes mellitus with complication (HCC)   . Tachycardia   . Hypertension     13/07/2018 PT, DPT 07/23/20 10:20 AM  [facility] [address] [place], [place], [place] Phone: [phone]   Fax:  [phone]  Name: Keith Newton MRN: Keith Newton Date of Birth: 11/18/1955

## 2020-07-24 ENCOUNTER — Telehealth: Payer: Self-pay | Admitting: Cardiovascular Disease

## 2020-07-24 NOTE — Telephone Encounter (Signed)
*  STAT* If patient is at the pharmacy, call can be transferred to refill team.   1. Which medications need to be refilled? (please list name of each medication and dose if known)  metoprolol tartrate (LOPRESSOR) 25 MG tablet  2. Which pharmacy/location (including street and city if local pharmacy) is medication to be sent to? [id number] - [place], [place] - [address]  3. Do they need a 30 day or 90 day supply?  30 day supply  [name] states the patient is completely out of medication.

## 2020-07-25 ENCOUNTER — Ambulatory Visit: Payer: 59 | Admitting: Cardiovascular Disease

## 2020-07-25 ENCOUNTER — Ambulatory Visit: Payer: 59 | Admitting: Physical Therapy

## 2020-07-26 ENCOUNTER — Encounter: Payer: Self-pay | Admitting: Orthopedic Surgery

## 2020-07-26 ENCOUNTER — Ambulatory Visit (INDEPENDENT_AMBULATORY_CARE_PROVIDER_SITE_OTHER): Payer: 59 | Admitting: Orthopedic Surgery

## 2020-07-26 DIAGNOSIS — M75121 Complete rotator cuff tear or rupture of right shoulder, not specified as traumatic: Secondary | ICD-10-CM

## 2020-07-26 NOTE — Progress Notes (Signed)
Post-Op Visit Note   Patient: Keith Newton           Date of Birth: October 24, 1955           MRN: [id number] Visit Date: 07/26/2020 PCP: Sandre Kitty, MD   Assessment & Plan:  Chief Complaint:  Chief Complaint  Patient presents with   Other    06/03/20 Right shoulder scope with RCR   Visit Diagnoses:  1. Complete tear of right rotator cuff, unspecified whether traumatic     Plan: Keith Newton is a 65 year old patient is now about 7 weeks out right shoulder arthroscopy with rotator cuff repair.  He has been going to therapy.  Reports continued pain.  He has been taking high-dose Lyrica for neuropathy.  Cannot take oxycodone or codeine derivatives because of allergy.  Cannot take anti-inflammatories either.  On exam is got pretty decent passive range of motion at 50/85/105.  Rotator cuff feels good strength wise and external rotation 15 degrees of abduction.  No coarse grinding or crepitus with passive range of motion.  Incision intact and shoulders.  I think he is really in a bind in terms of pain medicine.  I discussed with him how some of the patients that have had has had success with CBD oil topical and oral.  Recommending retrying their discretion.  Continue therapy.  Follow-Up Instructions: Return in about 6 weeks (around 09/06/2020).   Orders:  No orders of the defined types were placed in this encounter.  No orders of the defined types were placed in this encounter.   Imaging: No results found.  PMFS History: Patient Active Problem List   Diagnosis Date Noted   Complete tear of right rotator cuff    Subluxation of tendon of long head of biceps    Surgery, elective 05/09/2020   Pre-operative clearance 04/23/2020   Gastrocnemius strain, left 03/23/2020   Venous incompetence 12/28/2019   Exertional dyspnea 11/08/2019   Chronic right shoulder pain 09/02/2019   Diabetic neuropathy (HCC) 08/16/2019   Chest pain of uncertain etiology 04/27/2019   CAD -S/P PCI 01/02/2019    Obesity (BMI 30-39.9) 01/02/2019   Orthostatic dizziness 01/02/2019   Hyperlipidemia LDL goal <70 12/25/2018   History of non-ST elevation myocardial infarction (NSTEMI) 12/23/2018   Type 2 diabetes mellitus with complication (HCC)    Tachycardia    Hypertension    Past Medical History:  Diagnosis Date   Arthritis    Back pain    CAD (coronary artery disease)    a. s/p NSTEMI in 12/2018 with 80% Prox-RCA stenosis followed by 100% prox to mid-RCA stenosis --> DESx2 to the RCA. Med management of residual disease.    Carpal tunnel syndrome    DDD (degenerative disc disease)    Diabetes mellitus    type 2   Hyperlipidemia    Hypertension    states very mild   Low testosterone    Myocardial infarction (HCC)    Overweight    Seasonal allergies    Tachycardia     Family History  Problem Relation Age of Onset   Stroke Mother    CAD Father    CAD Maternal Grandfather    CAD Paternal Grandfather     Past Surgical History:  Procedure Laterality Date   CARDIAC CATHETERIZATION     CARPAL TUNNEL RELEASE  07/02/2011   Procedure: CARPAL TUNNEL RELEASE;  Surgeon: Wyn Forster., MD;  Location: [facility];  Service: Orthopedics;  Laterality: Right;  CARPAL TUNNEL RELEASE  07/30/2011   Procedure: CARPAL TUNNEL RELEASE;  Surgeon: Wyn Forster., MD;  Location: [facility];  Service: Orthopedics;  Laterality: Left;   CORONARY ANGIOPLASTY     CORONARY STENT INTERVENTION N/A 12/23/2018   Procedure: CORONARY STENT INTERVENTION;  Surgeon: Corky Crafts, MD;  Location: [facility] INVASIVE CV LAB;  Service: Cardiovascular;  Laterality: N/A;   LEFT HEART CATH AND CORONARY ANGIOGRAPHY N/A 12/23/2018   Procedure: LEFT HEART CATH AND CORONARY ANGIOGRAPHY;  Surgeon: Corky Crafts, MD;  Location: [facility] INVASIVE CV LAB;  Service: Cardiovascular;  Laterality: N/A;   LUMBAR EPIDURAL INJECTION     x2   SHOULDER ARTHROSCOPY WITH OPEN ROTATOR CUFF REPAIR AND DISTAL  CLAVICLE ACROMINECTOMY Right 06/03/2020   Procedure: right shoulder arthroscopy, biceps tenodesis, mini open rotator cuff tear repair subscap/supraspinatus, lateral debridement;  Surgeon: Cammy Copa, MD;  Location: [facility] OR;  Service: Orthopedics;  Laterality: Right;   Social History   Occupational History   Occupation: retired  Tobacco Use   Smoking status: Never   Smokeless tobacco: Never  Vaping Use   Vaping Use: Never used  Substance and Sexual Activity   Alcohol use: No   Drug use: No   Sexual activity: Not on file

## 2020-07-30 ENCOUNTER — Encounter: Payer: Self-pay | Admitting: Physical Therapy

## 2020-07-30 ENCOUNTER — Other Ambulatory Visit: Payer: Self-pay

## 2020-07-30 ENCOUNTER — Ambulatory Visit: Payer: 59 | Admitting: Physical Therapy

## 2020-07-30 DIAGNOSIS — M25511 Pain in right shoulder: Secondary | ICD-10-CM

## 2020-07-30 DIAGNOSIS — M25672 Stiffness of left ankle, not elsewhere classified: Secondary | ICD-10-CM

## 2020-07-30 DIAGNOSIS — M6281 Muscle weakness (generalized): Secondary | ICD-10-CM

## 2020-07-30 NOTE — Therapy (Signed)
[facility] Outpatient Rehabilitation [facility] [address] [place], [place], [address] Phone: [phone]   Fax:  [phone]  Physical Therapy Treatment  Patient Details  Name: Keith Newton MRN: [id number] Date of Birth: 09/18/1955 Referring Provider (PT): Keith Newton Keith Mckusick, MD   Encounter Date: 07/30/2020   PT End of Session - 07/30/20 0921     Visit Number 7    Number of Visits 16    Date for PT Re-Evaluation 08/23/20    Authorization Type [employer]    Authorization - Visit Number 7    Authorization - Number of Visits 30    Progress Note Due on Visit 10    Activity Tolerance Patient tolerated treatment well;Patient limited by pain    Behavior During Therapy [facility] for tasks assessed/performed             Past Medical History:  Diagnosis Date   Arthritis    Back pain    CAD (coronary artery disease)    a. s/p NSTEMI in 12/2018 with 80% Prox-RCA stenosis followed by 100% prox to mid-RCA stenosis --> DESx2 to the RCA. Med management of residual disease.    Carpal tunnel syndrome    DDD (degenerative disc disease)    Diabetes mellitus    type 2   Hyperlipidemia    Hypertension    states very mild   Low testosterone    Myocardial infarction (HCC)    Overweight    Seasonal allergies    Tachycardia     Past Surgical History:  Procedure Laterality Date   CARDIAC CATHETERIZATION     CARPAL TUNNEL RELEASE  07/02/2011   Procedure: CARPAL TUNNEL RELEASE;  Surgeon: Wyn Forster., MD;  Location: [facility];  Service: Orthopedics;  Laterality: Right;   CARPAL TUNNEL RELEASE  07/30/2011   Procedure: CARPAL TUNNEL RELEASE;  Surgeon: Wyn Forster., MD;  Location:  SURGERY CENTER;  Service: Orthopedics;  Laterality: Left;   CORONARY ANGIOPLASTY     CORONARY STENT INTERVENTION N/A 12/23/2018   Procedure: CORONARY STENT INTERVENTION;  Surgeon: Corky Crafts, MD;  Location: [facility] INVASIVE CV LAB;  Service:  Cardiovascular;  Laterality: N/A;   LEFT HEART CATH AND CORONARY ANGIOGRAPHY N/A 12/23/2018   Procedure: LEFT HEART CATH AND CORONARY ANGIOGRAPHY;  Surgeon: Corky Crafts, MD;  Location: [facility] INVASIVE CV LAB;  Service: Cardiovascular;  Laterality: N/A;   LUMBAR EPIDURAL INJECTION     x2   SHOULDER ARTHROSCOPY WITH OPEN ROTATOR CUFF REPAIR AND DISTAL CLAVICLE ACROMINECTOMY Right 06/03/2020   Procedure: right shoulder arthroscopy, biceps tenodesis, mini open rotator cuff tear repair subscap/supraspinatus, lateral debridement;  Surgeon: Cammy Copa, MD;  Location: [facility] OR;  Service: Orthopedics;  Laterality: Right;    There were no vitals filed for this visit.   Subjective Assessment - 07/30/20 0922     Subjective Pt reports that his pain level was a bit lower over the weekend.  It was around a 5/10 at night, It feels a bit more achy this morning.    Pertinent History R/C repair (supraspinatus and subscap) with biceps tenodesis on 06/03/2020, heart attack 12/2020, DMT II    Limitations Lifting;Reading;Writing;House hold activities;Sitting    Patient Stated Goals get shoulder better    Pain Score 7     Pain Location Shoulder    Pain Orientation Right    Pain Onset 1 to 4 weeks ago  [facility] PT Assessment - 07/30/20 0001       PROM   Right Shoulder Flexion 125 Degrees                           [facility] Adult PT Treatment/Exercise - 07/30/20 0001       Shoulder Exercises: Supine   Other Supine Exercises aarom - external rotation and flexion (chest press)      Shoulder Exercises: Standing   Extension Limitations yellow TB - 3x15    Row Limitations yellow TB - 3x15      Shoulder Exercises: Pulleys   Flexion 2 minutes    Flexion Limitations as tolerated    Scaption 2 minutes    Scaption Limitations as tolerated      Shoulder Exercises: Therapy Ball   Other Therapy Ball Exercises rol up wedge for AAROM - 2x15      Shoulder Exercises:  ROM/Strengthening   UBE (Upper Arm Bike) 4' - AAROM using L arm to drive      Shoulder Exercises: Isometric Strengthening   Internal Rotation Limitations 5''x10 sub-max as tolerted    ABduction Limitations 5''x10 sub-max as tolerted      Manual Therapy   Passive ROM PROM flexion, scaption, and ER with pt in supine                      PT Short Term Goals - 06/28/20 1128       PT SHORT TERM GOAL #1   Title Keith Newton will be >75% HEP compliant within 3 weeks to facilitate carryover between sessions and move towards eventual independent management of their condition.    Baseline Started on eval    Time 3    Period Weeks    Status New    Target Date 07/19/20               PT Long Term Goals - 06/28/20 1128       PT LONG TERM GOAL #1   Title Keith Newton will achieve >=60 degrees of shoulder ER at 90 degrees to allow proper shoulder mechanics during OH movement.    Baseline 10 at neutral    Time 8    Period Weeks    Status New    Target Date 08/23/20      PT LONG TERM GOAL #2   Title Keith Newton will demonstrate >130 degrees of active ROM, not limited by pain, in abduction and flexion to allow completion of activities involving reaching [facility]    Baseline 45 passive flexoin    Time 8    Period Weeks    Status New    Target Date 08/23/20      PT LONG TERM GOAL #3   Title Keith Newton will be able to reach [facility] into cabinet, not limited by pain    Baseline unable    Time 8    Period Weeks    Status New    Target Date 08/23/20      PT LONG TERM GOAL #4   Title Keith Newton will be able to resume driving, not limited by pain    Baseline unable    Time 8    Period Weeks    Status New    Target Date 08/23/20      PT LONG TERM GOAL #5   Title Keith Newton will be compliant with post op precautions throughout therapy.    Baseline  non compliant    Time 8    Period Weeks    Status New    Target Date 08/23/20      PT LONG TERM GOAL #6   Title Keith Newton will improve FOTO score from 30 to 66 as a proxy for functional improvement    Time 8    Period Weeks    Status New    Target Date 08/23/20                   Plan - 07/30/20 3244     Clinical Impression Statement Pt reports slightly higher pain d/t rain today but functionally shows improvement.  He is able to complete AAROM flexion with reduced pain and shows PROM during manual therapy to roughtly 125 degrees.  Sounds like he is in a difficult position with pain managment d/t allergy to opiods and unable to take NSAIDs.  Will continue to progress strength and ROM as able.    Personal Factors and Comorbidities Fitness;Comorbidity 3+;Behavior Pattern    Comorbidities obesity, CAD, HTN, tachycardia, DM, HA    Examination-Activity Limitations Bathing;Carry;Dressing;Lift;Reach Overhead    Examination-Participation Restrictions Cleaning;Community Activity;Driving    Stability/Clinical Decision Making Stable/Uncomplicated    Rehab Potential Good    PT Frequency 2x / week    PT Duration 8 weeks    PT Treatment/Interventions ADLs/Self Care Home Management;Cryotherapy;Electrical Stimulation;Ultrasound;Moist Heat;Iontophoresis 4mg /ml Dexamethasone;Gait training;Stair training;Functional mobility training;Therapeutic activities;Therapeutic exercise;Balance training;Patient/family education;Manual techniques;Dry needling;Passive range of motion;Taping;Vasopneumatic Device;Canalith Repostioning    PT Next Visit Plan Progress PROM. Assess response AAROM, gentle and isometrics for the periscapular, rotator cuff, lats, and traps    PT Home Exercise Plan [email or web address]    Consulted and Agree with Plan of Care Patient             Patient will benefit from skilled therapeutic intervention in order to improve the following deficits and impairments:  Decreased range of motion, Obesity, Decreased activity tolerance, Pain, Decreased strength, Decreased balance, Difficulty walking, Decreased knowledge  of precautions, Impaired UE functional use  Visit Diagnosis: Right shoulder pain, unspecified chronicity  Muscle weakness  Decreased range of motion of left ankle  Muscle weakness (generalized)     Problem List Patient Active Problem List   Diagnosis Date Noted   Complete tear of right rotator cuff    Subluxation of tendon of long head of biceps    Surgery, elective 05/09/2020   Pre-operative clearance 04/23/2020   Gastrocnemius strain, left 03/23/2020   Venous incompetence 12/28/2019   Exertional dyspnea 11/08/2019   Chronic right shoulder pain 09/02/2019   Diabetic neuropathy (HCC) 08/16/2019   Chest pain of uncertain etiology 04/27/2019   CAD -S/P PCI 01/02/2019   Obesity (BMI 30-39.9) 01/02/2019   Orthostatic dizziness 01/02/2019   Hyperlipidemia LDL goal <70 12/25/2018   History of non-ST elevation myocardial infarction (NSTEMI) 12/23/2018   Type 2 diabetes mellitus with complication (HCC)    Tachycardia    Hypertension     13/07/2018 PT, DPT 07/30/20 9:53 AM  [facility] Outpatient Rehabilitation [facility] [address] [place], [place], [place] Phone: [phone]   Fax:  [phone]  Name: Keith Newton MRN: Keith Newton Date of Birth: 18-Sep-1955

## 2020-08-01 ENCOUNTER — Other Ambulatory Visit: Payer: Self-pay

## 2020-08-01 ENCOUNTER — Ambulatory Visit: Payer: 59 | Admitting: Physical Therapy

## 2020-08-01 ENCOUNTER — Encounter: Payer: Self-pay | Admitting: Physical Therapy

## 2020-08-01 DIAGNOSIS — M6281 Muscle weakness (generalized): Secondary | ICD-10-CM

## 2020-08-01 DIAGNOSIS — M25511 Pain in right shoulder: Secondary | ICD-10-CM | POA: Diagnosis not present

## 2020-08-01 DIAGNOSIS — R262 Difficulty in walking, not elsewhere classified: Secondary | ICD-10-CM

## 2020-08-01 DIAGNOSIS — M25672 Stiffness of left ankle, not elsewhere classified: Secondary | ICD-10-CM

## 2020-08-01 NOTE — Therapy (Signed)
[facility] Outpatient Rehabilitation [facility] [address] [place], [place], [address] Phone: [phone]   Fax:  [phone]  Physical Therapy Treatment  Patient Details  Name: Keith Newton MRN: [id number] Date of Birth: Mar 01, 1955 Referring Provider (PT): August Saucer Corrie Mckusick, MD   Encounter Date: 08/01/2020   PT End of Session - 08/01/20 0912     Visit Number 8    Number of Visits 16    Date for PT Re-Evaluation 08/23/20    Authorization Type [employer]    Authorization - Visit Number 8    Authorization - Number of Visits 30    Progress Note Due on Visit 10    PT Start Time 0912    PT Stop Time 0957    PT Time Calculation (min) 45 min    Activity Tolerance Patient tolerated treatment well;Patient limited by pain    Behavior During Therapy [facility] for tasks assessed/performed             Past Medical History:  Diagnosis Date   Arthritis    Back pain    CAD (coronary artery disease)    a. s/p NSTEMI in 12/2018 with 80% Prox-RCA stenosis followed by 100% prox to mid-RCA stenosis --> DESx2 to the RCA. Med management of residual disease.    Carpal tunnel syndrome    DDD (degenerative disc disease)    Diabetes mellitus    type 2   Hyperlipidemia    Hypertension    states very mild   Low testosterone    Myocardial infarction (HCC)    Overweight    Seasonal allergies    Tachycardia     Past Surgical History:  Procedure Laterality Date   CARDIAC CATHETERIZATION     CARPAL TUNNEL RELEASE  07/02/2011   Procedure: CARPAL TUNNEL RELEASE;  Surgeon: Wyn Forster., MD;  Location: [facility];  Service: Orthopedics;  Laterality: Right;   CARPAL TUNNEL RELEASE  07/30/2011   Procedure: CARPAL TUNNEL RELEASE;  Surgeon: Wyn Forster., MD;  Location: [facility];  Service: Orthopedics;  Laterality: Left;   CORONARY ANGIOPLASTY     CORONARY STENT INTERVENTION N/A 12/23/2018   Procedure: CORONARY STENT INTERVENTION;   Surgeon: Corky Crafts, MD;  Location: [facility] INVASIVE CV LAB;  Service: Cardiovascular;  Laterality: N/A;   LEFT HEART CATH AND CORONARY ANGIOGRAPHY N/A 12/23/2018   Procedure: LEFT HEART CATH AND CORONARY ANGIOGRAPHY;  Surgeon: Corky Crafts, MD;  Location: [facility] INVASIVE CV LAB;  Service: Cardiovascular;  Laterality: N/A;   LUMBAR EPIDURAL INJECTION     x2   SHOULDER ARTHROSCOPY WITH OPEN ROTATOR CUFF REPAIR AND DISTAL CLAVICLE ACROMINECTOMY Right 06/03/2020   Procedure: right shoulder arthroscopy, biceps tenodesis, mini open rotator cuff tear repair subscap/supraspinatus, lateral debridement;  Surgeon: Cammy Copa, MD;  [facility];  Service: Orthopedics;  Laterality: Right;    There were no vitals filed for this visit.   Subjective Assessment - 08/01/20 0914     Subjective Pt reports that he is feeling sore this morning.  He reports he washed his car "mostly with my L hand" which he feels increased his pain.  (we discussed limiting acitivy again; he confirm understanding).    Pertinent History R/C repair (supraspinatus and subscap) with biceps tenodesis on 06/03/2020, heart attack 12/2020, DMT II    Limitations Lifting;Reading;Writing;House hold activities;Sitting    Patient Stated Goals get shoulder better    Currently in Pain? Yes    Pain Score  7     Pain Location Shoulder    Pain Onset 1 to 4 weeks ago                [facility] PT Assessment - 08/01/20 0001       PROM   Right Shoulder Flexion 145 Degrees                           [facility] Adult PT Treatment/Exercise - 08/01/20 0001       Shoulder Exercises: Supine   Other Supine Exercises aarom - external rotation and flexion (chest press to flexion)      Shoulder Exercises: Seated   Other Seated Exercises biceps curl - 3x10 @ 3#      Shoulder Exercises: Standing   Extension Limitations red TB - 3x15    Row Limitations red TB - 3x15      Shoulder Exercises: Therapy Ball   Other  Therapy Ball Exercises --      Shoulder Exercises: ROM/Strengthening   UBE (Upper Arm Bike) 4' - AAROM using L arm to drive    Other ROM/Strengthening Exercises finger walk - 5'      Shoulder Exercises: Isometric Strengthening   Internal Rotation Limitations 5''x10 sub-max as tolerted    ABduction Limitations 5''x10 sub-max as tolerted      Manual Therapy   Passive ROM PROM flexion, scaption, and ER with pt in supine                      PT Short Term Goals - 06/28/20 1128       PT SHORT TERM GOAL #1   Title Keith Newton will be >75% HEP compliant within 3 weeks to facilitate carryover between sessions and move towards eventual independent management of their condition.    Baseline Started on eval    Time 3    Period Weeks    Status New    Target Date 07/19/20               PT Long Term Goals - 06/28/20 1128       PT LONG TERM GOAL #1   Title Keith Newton will achieve >=60 degrees of shoulder ER at 90 degrees to allow proper shoulder mechanics during OH movement.    Baseline 10 at neutral    Time 8    Period Weeks    Status New    Target Date 08/23/20      PT LONG TERM GOAL #2   Title Keith Newton will demonstrate >130 degrees of active ROM, not limited by pain, in abduction and flexion to allow completion of activities involving reaching [facility]    Baseline 45 passive flexoin    Time 8    Period Weeks    Status New    Target Date 08/23/20      PT LONG TERM GOAL #3   Title Keith Newton will be able to reach [facility] into cabinet, not limited by pain    Baseline unable    Time 8    Period Weeks    Status New    Target Date 08/23/20      PT LONG TERM GOAL #4   Title Keith Newton will be able to resume driving, not limited by pain    Baseline unable    Time 8    Period Weeks    Status New    Target Date 08/23/20  PT LONG TERM GOAL #5   Title Keith Newton will be compliant with post op precautions throughout therapy.    Baseline non compliant     Time 8    Period Weeks    Status New    Target Date 08/23/20      PT LONG TERM GOAL #6   Title Keith Newton will improve FOTO score from 30 to 66 as a proxy for functional improvement    Time 8    Period Weeks    Status New    Target Date 08/23/20                   Plan - 08/01/20 8588     Clinical Impression Statement Pt is consistently limited by pain.  We attempted wall walk today which pt was able to complete to ~115 degrees but he reported a strong cramping sensation.  AAROM with dowel did diminish sxs and he shows significant improvement in fleixion AAROM to 145.  We will continue to progress strength and ROM as tolerated.    Personal Factors and Comorbidities Fitness;Comorbidity 3+;Behavior Pattern    Comorbidities obesity, CAD, HTN, tachycardia, DM, HA    Examination-Activity Limitations Bathing;Carry;Dressing;Lift;Reach Overhead    Examination-Participation Restrictions Cleaning;Community Activity;Driving    Stability/Clinical Decision Making Stable/Uncomplicated    Rehab Potential Good    PT Frequency 2x / week    PT Duration 8 weeks    PT Treatment/Interventions ADLs/Self Care Home Management;Cryotherapy;Electrical Stimulation;Ultrasound;Moist Heat;Iontophoresis 4mg /ml Dexamethasone;Gait training;Stair training;Functional mobility training;Therapeutic activities;Therapeutic exercise;Balance training;Patient/family education;Manual techniques;Dry needling;Passive range of motion;Taping;Vasopneumatic Device;Canalith Repostioning    PT Next Visit Plan Progress PROM. Assess response AAROM, gentle and isometrics for the periscapular, rotator cuff, lats, and traps    PT Home Exercise Plan [email or web address]    Consulted and Agree with Plan of Care Patient             Patient will benefit from skilled therapeutic intervention in order to improve the following deficits and impairments:  Decreased range of motion, Obesity, Decreased activity tolerance, Pain, Decreased strength,  Decreased balance, Difficulty walking, Decreased knowledge of precautions, Impaired UE functional use  Visit Diagnosis: Right shoulder pain, unspecified chronicity  Muscle weakness  Decreased range of motion of left ankle  Muscle weakness (generalized)  Difficulty in walking, not elsewhere classified     Problem List Patient Active Problem List   Diagnosis Date Noted   Complete tear of right rotator cuff    Subluxation of tendon of long head of biceps    Surgery, elective 05/09/2020   Pre-operative clearance 04/23/2020   Gastrocnemius strain, left 03/23/2020   Venous incompetence 12/28/2019   Exertional dyspnea 11/08/2019   Chronic right shoulder pain 09/02/2019   Diabetic neuropathy (HCC) 08/16/2019   Chest pain of uncertain etiology 04/27/2019   CAD -S/P PCI 01/02/2019   Obesity (BMI 30-39.9) 01/02/2019   Orthostatic dizziness 01/02/2019   Hyperlipidemia LDL goal <70 12/25/2018   History of non-ST elevation myocardial infarction (NSTEMI) 12/23/2018   Type 2 diabetes mellitus with complication (HCC)    Tachycardia    Hypertension     13/07/2018 PT, DPT 08/01/20 9:58 AM  [facility] [address] [place], [place], [place] Phone: [phone]   Fax:  [phone]  Name: Keith Newton MRN: Keith Newton Date of Birth: July 29, 1955

## 2020-08-06 ENCOUNTER — Other Ambulatory Visit: Payer: Self-pay

## 2020-08-06 ENCOUNTER — Ambulatory Visit: Payer: 59 | Admitting: Physical Therapy

## 2020-08-06 ENCOUNTER — Encounter: Payer: Self-pay | Admitting: Physical Therapy

## 2020-08-06 DIAGNOSIS — M25672 Stiffness of left ankle, not elsewhere classified: Secondary | ICD-10-CM

## 2020-08-06 DIAGNOSIS — M25511 Pain in right shoulder: Secondary | ICD-10-CM

## 2020-08-06 DIAGNOSIS — M6281 Muscle weakness (generalized): Secondary | ICD-10-CM

## 2020-08-06 NOTE — Patient Instructions (Signed)
Access Code: [email or web address] URL: [email or web address] Date: 08/06/2020 Prepared by: Alphonzo Severance  Exercises Shoulder External Rotation with Anchored Resistance - 1 x daily - 5 x weekly - 3 sets - 10 reps Shoulder Internal Rotation with Resistance - 1 x daily - 5 x weekly - 3 sets - 10 reps Supine Shoulder Flexion Extension AAROM with Dowel - 1 x daily - 7 x weekly - 3 sets - 10 reps

## 2020-08-06 NOTE — Therapy (Signed)
[facility] Outpatient Rehabilitation [facility] [address] [place], [place], [address] Phone: [phone]   Fax:  [phone]  Physical Therapy Treatment  Patient Details  Name: Keith Newton MRN: [id number] Date of Birth: Apr 05, 1955 Referring Provider (PT): August Saucer Corrie Mckusick, MD   Encounter Date: 08/06/2020   PT End of Session - 08/06/20 0916     Visit Number 9    Number of Visits 16    Date for PT Re-Evaluation 08/23/20    Authorization Type [employer]    Authorization - Visit Number --   same as visit number   Authorization - Number of Visits 30    Progress Note Due on Visit 10    PT Start Time 0915    PT Stop Time 0958    PT Time Calculation (min) 43 min    Activity Tolerance Patient tolerated treatment well;Patient limited by pain    Behavior During Therapy [facility] for tasks assessed/performed             Past Medical History:  Diagnosis Date   Arthritis    Back pain    CAD (coronary artery disease)    a. s/p NSTEMI in 12/2018 with 80% Prox-RCA stenosis followed by 100% prox to mid-RCA stenosis --> DESx2 to the RCA. Med management of residual disease.    Carpal tunnel syndrome    DDD (degenerative disc disease)    Diabetes mellitus    type 2   Hyperlipidemia    Hypertension    states very mild   Low testosterone    Myocardial infarction (HCC)    Overweight    Seasonal allergies    Tachycardia     Past Surgical History:  Procedure Laterality Date   CARDIAC CATHETERIZATION     CARPAL TUNNEL RELEASE  07/02/2011   Procedure: CARPAL TUNNEL RELEASE;  Surgeon: Wyn Forster., MD;  Location: [facility];  Service: Orthopedics;  Laterality: Right;   CARPAL TUNNEL RELEASE  07/30/2011   Procedure: CARPAL TUNNEL RELEASE;  Surgeon: Wyn Forster., MD;  Location: [facility];  Service: Orthopedics;  Laterality: Left;   CORONARY ANGIOPLASTY     CORONARY STENT INTERVENTION N/A 12/23/2018   Procedure: CORONARY  STENT INTERVENTION;  Surgeon: Corky Crafts, MD;  Location: [facility] INVASIVE CV LAB;  Service: Cardiovascular;  Laterality: N/A;   LEFT HEART CATH AND CORONARY ANGIOGRAPHY N/A 12/23/2018   Procedure: LEFT HEART CATH AND CORONARY ANGIOGRAPHY;  Surgeon: Corky Crafts, MD;  Location: [facility] INVASIVE CV LAB;  Service: Cardiovascular;  Laterality: N/A;   LUMBAR EPIDURAL INJECTION     x2   SHOULDER ARTHROSCOPY WITH OPEN ROTATOR CUFF REPAIR AND DISTAL CLAVICLE ACROMINECTOMY Right 06/03/2020   Procedure: right shoulder arthroscopy, biceps tenodesis, mini open rotator cuff tear repair subscap/supraspinatus, lateral debridement;  Surgeon: Cammy Copa, MD;  Location: [facility];  Service: Orthopedics;  Laterality: Right;    There were no vitals filed for this visit.   Subjective Assessment - 08/06/20 0917     Subjective Pt reports that he feels like things are improving.  he is getting better sleep.  He continues to endorse weakness.    Pertinent History R/C repair (supraspinatus and subscap) with biceps tenodesis on 06/03/2020, heart attack 12/2020, DMT II    Limitations Lifting;Reading;Writing;House hold activities;Sitting    Patient Stated Goals get shoulder better    Currently in Pain? Yes    Pain Score 4     Pain Location Shoulder  Pain Orientation Right    Pain Onset 1 to 4 weeks ago                                [facility] Adult PT Treatment/Exercise - 08/06/20 0001       Shoulder Exercises: Supine   Other Supine Exercises aarom - external rotation and flexion (chest press to flexion)      Shoulder Exercises: Standing   External Rotation Limitations YTB - 3x10    Internal Rotation Limitations YTB - 3x10    Extension Limitations green TB - 3x15    Row Limitations green TB - 3x15 - with R TB external Rotation    Other Standing Exercises farmers carry - 8# - 3x280'      Shoulder Exercises: ROM/Strengthening   UBE (Upper Arm Bike) 5' L1    Other  ROM/Strengthening Exercises finger walk - 5'                      PT Short Term Goals - 06/28/20 1128       PT SHORT TERM GOAL #1   Title [name] will be >75% HEP compliant within 3 weeks to facilitate carryover between sessions and move towards eventual independent management of their condition.    Baseline Started on eval    Time 3    Period Weeks    Status New    Target Date 07/19/20               PT Long Term Goals - 06/28/20 1128       PT LONG TERM GOAL #1   Title [name] will achieve >=60 degrees of shoulder ER at 90 degrees to allow proper shoulder mechanics during OH movement.    Baseline 10 at neutral    Time 8    Period Weeks    Status New    Target Date 08/23/20      PT LONG TERM GOAL #2   Title [name] will demonstrate >130 degrees of active ROM, not limited by pain, in abduction and flexion to allow completion of activities involving reaching [facility]    Baseline 45 passive flexoin    Time 8    Period Weeks    Status New    Target Date 08/23/20      PT LONG TERM GOAL #3   Title Mechel Schutter will be able to reach [facility] into cabinet, not limited by pain    Baseline unable    Time 8    Period Weeks    Status New    Target Date 08/23/20      PT LONG TERM GOAL #4   Title Leone Putman will be able to resume driving, not limited by pain    Baseline unable    Time 8    Period Weeks    Status New    Target Date 08/23/20      PT LONG TERM GOAL #5   Title Justin Meisenheimer will be compliant with post op precautions throughout therapy.    Baseline non compliant    Time 8    Period Weeks    Status New    Target Date 08/23/20      PT LONG TERM GOAL #6   Title Nicklaus Alviar will improve FOTO score from 30 to 66 as a proxy for functional improvement    Time 8    Period Weeks  Status New    Target Date 08/23/20                   Plan - 08/06/20 9242     Clinical Impression Statement Antwuan Eckley is progressing well with  therapy.  Today we concentrated on rotator cuff strengthening, periscapular strengthening, and shoulder range of motion.  Pt able to move to resisted IR/ER with band today.  Overall he shows ability to complete higher level exercises with minimal increase in pain.  Pt reports a mild increase in pain following therapy.  Next session we will continue current course, progressing as able/appropriate.  HEP was updated and reissued to patient.  Pt will continue to benefit from skilled physical therapy to address remaining deficits and achieve listed goals.  Continue per POC.    Personal Factors and Comorbidities Fitness;Comorbidity 3+;Behavior Pattern    Comorbidities obesity, CAD, HTN, tachycardia, DM, HA    Examination-Activity Limitations Bathing;Carry;Dressing;Lift;Reach Overhead    Examination-Participation Restrictions Cleaning;Community Activity;Driving    Stability/Clinical Decision Making Stable/Uncomplicated    Rehab Potential Good    PT Frequency 2x / week    PT Duration 8 weeks    PT Treatment/Interventions ADLs/Self Care Home Management;Cryotherapy;Electrical Stimulation;Ultrasound;Moist Heat;Iontophoresis 4mg /ml Dexamethasone;Gait training;Stair training;Functional mobility training;Therapeutic activities;Therapeutic exercise;Balance training;Patient/family education;Manual techniques;Dry needling;Passive range of motion;Taping;Vasopneumatic Device;Canalith Repostioning    PT Next Visit Plan Progress PROM. Assess response AAROM, gentle and isometrics for the periscapular, rotator cuff, lats, and traps    PT Home Exercise Plan [email or web address]    Consulted and Agree with Plan of Care Patient             Patient will benefit from skilled therapeutic intervention in order to improve the following deficits and impairments:  Decreased range of motion, Obesity, Decreased activity tolerance, Pain, Decreased strength, Decreased balance, Difficulty walking, Decreased knowledge of precautions, Impaired UE  functional use  Visit Diagnosis: Right shoulder pain, unspecified chronicity  Muscle weakness  Decreased range of motion of left ankle  Muscle weakness (generalized)     Problem List Patient Active Problem List   Diagnosis Date Noted   Complete tear of right rotator cuff    Subluxation of tendon of long head of biceps    Surgery, elective 05/09/2020   Pre-operative clearance 04/23/2020   Gastrocnemius strain, left 03/23/2020   Venous incompetence 12/28/2019   Exertional dyspnea 11/08/2019   Chronic right shoulder pain 09/02/2019   Diabetic neuropathy (HCC) 08/16/2019   Chest pain of uncertain etiology 04/27/2019   CAD -S/P PCI 01/02/2019   Obesity (BMI 30-39.9) 01/02/2019   Orthostatic dizziness 01/02/2019   Hyperlipidemia LDL goal <70 12/25/2018   History of non-ST elevation myocardial infarction (NSTEMI) 12/23/2018   Type 2 diabetes mellitus with complication (HCC)    Tachycardia    Hypertension     13/07/2018 PT, DPT 08/06/20 9:57 AM  [facility] [address] [place], [place], [place] Phone: [phone]   Fax:  [phone]  Name: Hieu Herms MRN: [name] Date of Birth: November 18, 1955

## 2020-08-07 ENCOUNTER — Telehealth: Payer: Self-pay

## 2020-08-07 ENCOUNTER — Telehealth: Payer: Self-pay | Admitting: Cardiovascular Disease

## 2020-08-07 MED ORDER — METOPROLOL TARTRATE 25 MG PO TABS: 75.0000 mg | ORAL_TABLET | Freq: Two times a day (BID) | ORAL | 5 refills | Status: DC

## 2020-08-07 NOTE — Telephone Encounter (Signed)
Patients daughter calls nurse line wanting PCP opinion on patient starting blackseed oil. Daughter reports he has used this in the past to help boost his energy. Patients daughter reports she has already left a message for his cardiologist for their recommendation as well. Please advise.

## 2020-08-07 NOTE — Telephone Encounter (Signed)
Will route this message to our Pharmacist at [facility] office, to further review and advise on safety of starting black seed oil, with all cardiac meds pt is taking.  Triage nursing to follow-up with the pts daughter accordingly thereafter.

## 2020-08-07 NOTE — Telephone Encounter (Signed)
Spoke with pt wife, aware of the recommendations. She will give a call to his medical doctor to see about checking some labs for vit deficiency or other reasons of no energy.

## 2020-08-07 NOTE — Telephone Encounter (Signed)
    *  STAT* If patient is at the pharmacy, call can be transferred to refill team.   1. Which medications need to be refilled? (please list name of each medication and dose if known)   metoprolol tartrate (LOPRESSOR) 25 MG tablet    2. Which pharmacy/location (including street and city if local pharmacy) is medication to be sent to? [id number] - [place], [place] - [address]  3. Do they need a 30 day or 90 day supply? 90 days  Pt is out of meds, they did not able to get the refill sent in April

## 2020-08-07 NOTE — Telephone Encounter (Signed)
    Pt c/o medication issue:  1. Name of Medication: black seed oil (liquid) supplement for energy  2. How are you currently taking this medication (dosage and times per day)?   3. Are you having a reaction (difficulty breathing--STAT)?   4. What is your medication issue? Pt's daughter calling, she said pt is no energy and wanted to try taking black seed oil its a liquid form supplements to boost energy. She wanted to know if its ok for the pt to take it with his heart meds

## 2020-08-07 NOTE — Telephone Encounter (Signed)
I could not find any evidence that black seed oil provides benefits for energy.   Did find this information:  Black seed oil may slow blood clotting and increase the risk of bleeding. Therefore, you should not take black seed oil if you have a bleeding disorder or take medication that affects blood clotting. In addition, stop taking black seed oil at least two weeks before a scheduled surgery

## 2020-08-07 NOTE — Telephone Encounter (Signed)
Let patient know that I sent in refills for the patient's Metoprolol to the pharmacy.

## 2020-08-08 ENCOUNTER — Ambulatory Visit: Payer: 59

## 2020-08-08 ENCOUNTER — Other Ambulatory Visit: Payer: Self-pay

## 2020-08-08 NOTE — Telephone Encounter (Signed)
Please let them know I am not familiar with black seed herbal supplements and therefore unable to assess whether or not there are any risks or interactions with any of the medications he is currently taking.  Also, let them know that herbal supplements in general are not regulated by the FDA and may have inaccurate concentrations or contaminants in them that may be harmful.

## 2020-08-08 NOTE — Telephone Encounter (Signed)
Daughter reports cardiologist advised against medication.  Daughter scheduled him an apt with you next week, however she will not be able to attend. She is requesting lab work to check vitamin D, B12, and CBC for fatigue.

## 2020-08-13 ENCOUNTER — Encounter: Payer: Self-pay | Admitting: Family Medicine

## 2020-08-13 ENCOUNTER — Other Ambulatory Visit: Payer: Self-pay

## 2020-08-13 ENCOUNTER — Ambulatory Visit (INDEPENDENT_AMBULATORY_CARE_PROVIDER_SITE_OTHER): Payer: 59 | Admitting: Family Medicine

## 2020-08-13 VITALS — BP 118/74 | HR 106 | Ht 68.0 in | Wt 242.8 lb

## 2020-08-13 DIAGNOSIS — R5382 Chronic fatigue, unspecified: Secondary | ICD-10-CM | POA: Diagnosis not present

## 2020-08-13 DIAGNOSIS — T733XXA Exhaustion due to excessive exertion, initial encounter: Secondary | ICD-10-CM | POA: Diagnosis not present

## 2020-08-13 NOTE — Assessment & Plan Note (Addendum)
Patient states he gets tired more easily when doing yard work such as weed eating.  Acknowledges that he is not supposed to be doing these activities due to his recent shoulder surgery.  No orthopnea complaints, no symptoms concerning for OSA.  No concerns for heart failure on exam or through history. Pt had a recent perfusion study in march 2022 that was not concerning for worsening heart disease.   We will get TSH and CBC, but this is likely due to age and environmental factors such as the heat and summertime.  Advised him to work outside only during the early morning and evening to avoid the hot part of the day.

## 2020-08-13 NOTE — Progress Notes (Signed)
    SUBJECTIVE:   CHIEF COMPLAINT / HPI:   Fatigue: Gets tired weedeating or when doing yardwork in the back yard. Doesn't feel he can do as much as he used to.  Doesn't think it's due to the heat. Knows he shouldn't be doing yardwork right now due to his recent shoulder surgery.  No difficulty breathing, no orthopnea, no leg swelling. Was recommended for sleep study in the past but was not interested in it.  Doesn't have any apneic episodes or snoring as far as he is aware.    PERTINENT  PMH / PSH: rotator cuff surgery,   OBJECTIVE:   BP 118/74   Pulse (!) 106   Ht 5\' 8"  (1.727 m)   Wt 242 lb 12.8 oz (110.1 kg)   SpO2 96%   BMI 36.92 kg/m   General: Alert oriented.  No acute distress. CV: Regular rate and rhythm, no murmurs Pulmonary: Lungs good auscultation bilaterally, Extremities: No lower extremity edema.  ASSESSMENT/PLAN:   Fatigue due to excessive exertion Patient states he gets tired more easily when doing yard work such as weed eating.  Acknowledges that he is not supposed to be doing these activities due to his recent shoulder surgery.  No orthopnea complaints, no symptoms concerning for OSA.  No concerns for heart failure on exam or through history.  Saw cardiologist for preop work-up in March.  We will get TSH and CBC, but this is likely due to age and environmental factors such as the heat and summertime.  Advised him to work outside only during the early morning and evening to avoid the hot part of the day.     April, MD [facility]

## 2020-08-13 NOTE — Patient Instructions (Signed)
He was nice to see you today,  I will get some blood test to check for fatigue causes.  I expect this to be due to a commendation of aging and the summer heat.  Try to avoid going out during the hottest parts of the day.  I would like you to follow-up in about 4 weeks to see if the fatigue is worsening or improving.  Have a great day,  Frederic Jericho, MD

## 2020-08-14 LAB — CBC
Hematocrit: 46.9 % (ref 37.5–51.0)
Hemoglobin: 15.1 g/dL (ref 13.0–17.7)
MCH: 27.3 pg (ref 26.6–33.0)
MCHC: 32.2 g/dL (ref 31.5–35.7)
MCV: 85 fL (ref 79–97)
Platelets: 330 10*3/uL (ref 150–450)
RBC: 5.53 x10E6/uL (ref 4.14–5.80)
RDW: 15.6 % — ABNORMAL HIGH (ref 11.6–15.4)
WBC: 6.3 10*3/uL (ref 3.4–10.8)

## 2020-08-14 LAB — TSH: TSH: 1.49 u[IU]/mL (ref 0.450–4.500)

## 2020-08-15 ENCOUNTER — Other Ambulatory Visit: Payer: Self-pay

## 2020-08-15 ENCOUNTER — Ambulatory Visit: Payer: 59

## 2020-08-15 DIAGNOSIS — M25511 Pain in right shoulder: Secondary | ICD-10-CM | POA: Diagnosis not present

## 2020-08-15 DIAGNOSIS — R293 Abnormal posture: Secondary | ICD-10-CM

## 2020-08-15 DIAGNOSIS — M6281 Muscle weakness (generalized): Secondary | ICD-10-CM

## 2020-08-15 NOTE — Therapy (Signed)
[facility] Outpatient Rehabilitation [facility] [address] [place], [place], [address] Phone: [phone]   Fax:  [phone]  Physical Therapy Treatment/Progress Note  Patient Details  Name: Keith Newton MRN: [id number] Date of Birth: 27-Mar-1955 Referring Provider (PT): August Saucer Corrie Mckusick, MD  Progress Note Reporting Period  to 08/15/20- 06/28/20  See note below for Objective Data and Assessment of Progress/Goals.      Encounter Date: 08/15/2020   PT End of Session - 08/15/20 0933     Visit Number 10    Number of Visits 16    Date for PT Re-Evaluation 08/23/20    Authorization Type [employer]    Authorization - Number of Visits 30    Progress Note Due on Visit 10    PT Start Time 0933    PT Stop Time 1024    PT Time Calculation (min) 51 min    Activity Tolerance Patient tolerated treatment well;Patient limited by pain    Behavior During Therapy [facility] for tasks assessed/performed             Past Medical History:  Diagnosis Date   Arthritis    Back pain    CAD (coronary artery disease)    a. s/p NSTEMI in 12/2018 with 80% Prox-RCA stenosis followed by 100% prox to mid-RCA stenosis --> DESx2 to the RCA. Med management of residual disease.    Carpal tunnel syndrome    DDD (degenerative disc disease)    Diabetes mellitus    type 2   Hyperlipidemia    Hypertension    states very mild   Low testosterone    Myocardial infarction (HCC)    Overweight    Seasonal allergies    Tachycardia     Past Surgical History:  Procedure Laterality Date   CARDIAC CATHETERIZATION     CARPAL TUNNEL RELEASE  07/02/2011   Procedure: CARPAL TUNNEL RELEASE;  Surgeon: Wyn Forster., MD;  Location: [facility];  Service: Orthopedics;  Laterality: Right;   CARPAL TUNNEL RELEASE  07/30/2011   Procedure: CARPAL TUNNEL RELEASE;  Surgeon: Wyn Forster., MD;  Location: [facility];  Service: Orthopedics;  Laterality: Left;    CORONARY ANGIOPLASTY     CORONARY STENT INTERVENTION N/A 12/23/2018   Procedure: CORONARY STENT INTERVENTION;  Surgeon: Corky Crafts, MD;  Location: [facility] INVASIVE CV LAB;  Service: Cardiovascular;  Laterality: N/A;   LEFT HEART CATH AND CORONARY ANGIOGRAPHY N/A 12/23/2018   Procedure: LEFT HEART CATH AND CORONARY ANGIOGRAPHY;  Surgeon: Corky Crafts, MD;  Location: [facility] INVASIVE CV LAB;  Service: Cardiovascular;  Laterality: N/A;   LUMBAR EPIDURAL INJECTION     x2   SHOULDER ARTHROSCOPY WITH OPEN ROTATOR CUFF REPAIR AND DISTAL CLAVICLE ACROMINECTOMY Right 06/03/2020   Procedure: right shoulder arthroscopy, biceps tenodesis, mini open rotator cuff tear repair subscap/supraspinatus, lateral debridement;  Surgeon: Cammy Copa, MD;  Location: [facility];  Service: Orthopedics;  Laterality: Right;    There were no vitals filed for this visit.   Subjective Assessment - 08/15/20 0942     Subjective Pt reports intermittent R shoulder pain. last nigth it woke him up.    Pertinent History R/C repair (supraspinatus and subscap) with biceps tenodesis on 06/03/2020, heart attack 12/2020, DMT II    Currently in Pain? Yes    Pain Score 6     Pain Location Shoulder    Pain Orientation Right    Pain Descriptors / Indicators Aching  Pain Type Chronic pain    Pain Onset 1 to 4 weeks ago    Pain Frequency Intermittent    Aggravating Factors  Movement, sleeping positions    Pain Relieving Factors Ice, topical agents    Effect of Pain on Daily Activities anything involving lifting, reaching, lifting, writing                [facility] PT Assessment - 08/15/20 0001       AROM   AROM Assessment Site Shoulder    Right/Left Shoulder Right    Right Shoulder Flexion 85 Degrees   AAROM 130 finger ladder                          [facility] Adult PT Treatment/Exercise - 08/15/20 0001       Shoulder Exercises: Standing   External Rotation Limitations YTB - 3x10    Internal  Rotation Limitations YTB - 3x10    Extension Limitations green TB - 3x15    Row Limitations green TB - 3x15 - with R TB external Rotation    Other Standing Exercises farmers carry - 8# - 3x280'      Shoulder Exercises: ROM/Strengthening   UBE (Upper Arm Bike) 5' L1    Other ROM/Strengthening Exercises finger walk on ladder- 5'      Cryotherapy   Number Minutes Cryotherapy 10 Minutes    Cryotherapy Location Shoulder   R   Type of Cryotherapy Ice pack                      PT Short Term Goals - 06/28/20 1128       PT SHORT TERM GOAL #1   Title [name] will be >75% HEP compliant within 3 weeks to facilitate carryover between sessions and move towards eventual independent management of their condition.    Baseline Started on eval    Time 3    Period Weeks    Status New    Target Date 07/19/20               PT Long Term Goals - 08/15/20 1010       PT LONG TERM GOAL #2   Title [name] will demonstrate >130 degrees of active ROM, not limited by pain, in abduction and flexion to allow completion of activities involving reaching OH. 08/15/20: PROM= 145d, AAROM=130d, AROM=85d    Baseline 45 passive flexoin    Status On-going    Target Date 08/23/20                   Plan - 08/15/20 0934     Clinical Impression Statement PT was completed for R RC and periscapular strengthening and ROM. PROM continues at flexion=140d, while intial ROMs for AAROM flexion c finger ladder = 130d and AROM= 85d. AAROM and AROM measures are limited by pain and weakness. A cold pack was applied to the R shoulder for 10 mins after the session for symptom management. Pt will benefit from continued PT to adress deficits and optimize functional use of the R UE.    Personal Factors and Comorbidities Fitness;Comorbidity 3+;Behavior Pattern    Comorbidities obesity, CAD, HTN, tachycardia, DM, HA    Examination-Activity Limitations Bathing;Carry;Dressing;Lift;Reach Overhead     Examination-Participation Restrictions Cleaning;Community Activity;Driving    Stability/Clinical Decision Making Stable/Uncomplicated    Clinical Decision Making Low    Rehab Potential Good    PT Frequency 2x /  week    PT Duration 8 weeks    PT Treatment/Interventions ADLs/Self Care Home Management;Cryotherapy;Electrical Stimulation;Ultrasound;Moist Heat;Iontophoresis 4mg /ml Dexamethasone;Gait training;Stair training;Functional mobility training;Therapeutic activities;Therapeutic exercise;Balance training;Patient/family education;Manual techniques;Dry needling;Passive range of motion;Taping;Vasopneumatic Device;Canalith Repostioning    PT Next Visit Plan Pesponse AAROM and strengthening for the periscapular, rotator cuff, lats, and traps as tolerated    PT Home Exercise Plan [email or web address]    Consulted and Agree with Plan of Care Patient             Patient will benefit from skilled therapeutic intervention in order to improve the following deficits and impairments:  Decreased range of motion, Obesity, Decreased activity tolerance, Pain, Decreased strength, Decreased balance, Difficulty walking, Decreased knowledge of precautions, Impaired UE functional use  Visit Diagnosis: Right shoulder pain, unspecified chronicity  Muscle weakness  Abnormal posture     Problem List Patient Active Problem List   Diagnosis Date Noted   Fatigue due to excessive exertion 08/13/2020   Complete tear of right rotator cuff    Subluxation of tendon of long head of biceps    Surgery, elective 05/09/2020   Gastrocnemius strain, left 03/23/2020   Venous incompetence 12/28/2019   Exertional dyspnea 11/08/2019   Chronic right shoulder pain 09/02/2019   Diabetic neuropathy (HCC) 08/16/2019   Chest pain of uncertain etiology 04/27/2019   CAD -S/P PCI 01/02/2019   Obesity (BMI 30-39.9) 01/02/2019   Orthostatic dizziness 01/02/2019   Hyperlipidemia LDL goal <70 12/25/2018   History of non-ST elevation  myocardial infarction (NSTEMI) 12/23/2018   Type 2 diabetes mellitus with complication (HCC)    Tachycardia    Hypertension    13/07/2018 MS, PT 08/15/20 1:25 PM   [facility] Out[facility] [address] [place], [place], [place] Phone: [phone]   Fax:  [phone]  Name: Audon Heymann MRN: [name] Date of Birth: 1955-11-30

## 2020-08-21 ENCOUNTER — Other Ambulatory Visit: Payer: Self-pay

## 2020-08-21 ENCOUNTER — Ambulatory Visit: Payer: 59

## 2020-08-21 MED ORDER — PREGABALIN 75 MG PO CAPS: 75.0000 mg | ORAL_CAPSULE | Freq: Three times a day (TID) | ORAL | 1 refills | Status: DC

## 2020-08-27 ENCOUNTER — Other Ambulatory Visit: Payer: Self-pay

## 2020-08-27 ENCOUNTER — Ambulatory Visit: Payer: 59 | Attending: Family Medicine

## 2020-08-27 DIAGNOSIS — R293 Abnormal posture: Secondary | ICD-10-CM | POA: Diagnosis present

## 2020-08-27 DIAGNOSIS — M25511 Pain in right shoulder: Secondary | ICD-10-CM

## 2020-08-27 DIAGNOSIS — M25672 Stiffness of left ankle, not elsewhere classified: Secondary | ICD-10-CM | POA: Diagnosis present

## 2020-08-27 DIAGNOSIS — M6281 Muscle weakness (generalized): Secondary | ICD-10-CM | POA: Insufficient documentation

## 2020-08-27 NOTE — Therapy (Signed)
[facility] Outpatient Rehabilitation [facility] [address] [place], [place], [address] Phone: [phone]   Fax:  [phone]  Physical Therapy Treatment  Patient Details  Name: Keith Newton MRN: [id number] Date of Birth: 1955-07-13 Referring Provider (PT): August Saucer Corrie Mckusick, MD   Encounter Date: 08/27/2020   PT End of Session - 08/27/20 1050     Visit Number 11    Number of Visits 16    Date for PT Re-Evaluation 08/23/20    Authorization Type [employer]    Authorization - Number of Visits 30    Progress Note Due on Visit 20    PT Start Time 1017    PT Stop Time 1110    PT Time Calculation (min) 53 min    Activity Tolerance Patient tolerated treatment well;Patient limited by pain    Behavior During Therapy [facility] for tasks assessed/performed             Past Medical History:  Diagnosis Date   Arthritis    Back pain    CAD (coronary artery disease)    a. s/p NSTEMI in 12/2018 with 80% Prox-RCA stenosis followed by 100% prox to mid-RCA stenosis --> DESx2 to the RCA. Med management of residual disease.    Carpal tunnel syndrome    DDD (degenerative disc disease)    Diabetes mellitus    type 2   Hyperlipidemia    Hypertension    states very mild   Low testosterone    Myocardial infarction (HCC)    Overweight    Seasonal allergies    Tachycardia     Past Surgical History:  Procedure Laterality Date   CARDIAC CATHETERIZATION     CARPAL TUNNEL RELEASE  07/02/2011   Procedure: CARPAL TUNNEL RELEASE;  Surgeon: Wyn Forster., MD;  Location: [facility];  Service: Orthopedics;  Laterality: Right;   CARPAL TUNNEL RELEASE  07/30/2011   Procedure: CARPAL TUNNEL RELEASE;  Surgeon: Wyn Forster., MD;  Location: [facility];  Service: Orthopedics;  Laterality: Left;   CORONARY ANGIOPLASTY     CORONARY STENT INTERVENTION N/A 12/23/2018   Procedure: CORONARY STENT INTERVENTION;  Surgeon: Corky Crafts, MD;   Location: [facility] INVASIVE CV LAB;  Service: Cardiovascular;  Laterality: N/A;   LEFT HEART CATH AND CORONARY ANGIOGRAPHY N/A 12/23/2018   Procedure: LEFT HEART CATH AND CORONARY ANGIOGRAPHY;  Surgeon: Corky Crafts, MD;  Location: [facility] INVASIVE CV LAB;  Service: Cardiovascular;  Laterality: N/A;   LUMBAR EPIDURAL INJECTION     x2   SHOULDER ARTHROSCOPY WITH OPEN ROTATOR CUFF REPAIR AND DISTAL CLAVICLE ACROMINECTOMY Right 06/03/2020   Procedure: right shoulder arthroscopy, biceps tenodesis, mini open rotator cuff tear repair subscap/supraspinatus, lateral debridement;  Surgeon: Cammy Copa, MD;  Location: [facility];  Service: Orthopedics;  Laterality: Right;    There were no vitals filed for this visit.   Subjective Assessment - 08/27/20 1232     Subjective Pt reports low intermittent R shoulder pain during the day time. Pt reports more issues with pain at night being an active sleeper, but that is improving.    Pertinent History R/C repair (supraspinatus and subscap) with biceps tenodesis on 06/03/2020, heart attack 12/2020, DMT II    Limitations Lifting;Reading;Writing;House hold activities;Sitting    Patient Stated Goals get shoulder better    Currently in Pain? Yes    Pain Score 5     Pain Location Shoulder    Pain Orientation Right  Pain Descriptors / Indicators Aching    Pain Type Chronic pain    Pain Onset More than a month ago    Pain Frequency Intermittent                               [facility] Adult PT Treatment/Exercise - 08/27/20 0001       Exercises   Exercises Shoulder      Shoulder Exercises: Supine   Protraction Both;10 reps    Protraction Weight (lbs) 3   with wand     Shoulder Exercises: Sidelying   External Rotation Right;10 reps   2 sets   External Rotation Weight (lbs) 2    ABduction Right;10 reps   2 sets     Shoulder Exercises: Standing   Extension Limitations green TB - 3x15    Row Limitations green TB - 3x15 - with R TB  external Rotation      Cryotherapy   Number Minutes Cryotherapy 10 Minutes    Cryotherapy Location Shoulder   R   Type of Cryotherapy Ice pack                      PT Short Term Goals - 06/28/20 1128       PT SHORT TERM GOAL #1   Title [name] will be >75% HEP compliant within 3 weeks to facilitate carryover between sessions and move towards eventual independent management of their condition.    Baseline Started on eval    Time 3    Period Weeks    Status New    Target Date 07/19/20               PT Long Term Goals - 08/27/20 1257       PT LONG TERM GOAL #1   Title [name] will achieve >=60 degrees of shoulder ER at 90 degrees to allow proper shoulder mechanics during OH movement.    Baseline 10 at neutral    Status On-going    Target Date 11/02/20      PT LONG TERM GOAL #2   Title [name] will demonstrate >130 degrees of active ROM, not limited by pain, in abduction and flexion to allow completion of activities involving reaching OH. 08/15/20: PROM= 145d, AAROM=130d, AROM=85d    Baseline 45 passive flexoin    Status On-going    Target Date 11/02/20      PT LONG TERM GOAL #3   Title Pepe Mineau will be able to reach [facility] into cabinet, not limited by pain    Baseline unable    Status On-going    Target Date 11/02/20      PT LONG TERM GOAL #4   Title Branton Einstein will be able to resume driving, not limited by pain    Status On-going    Target Date 11/02/20      PT LONG TERM GOAL #5   Title Obdulio Mash will be compliant with post op precautions throughout therapy.    Baseline non compliant    Status On-going    Target Date 11/02/20      PT LONG TERM GOAL #6   Title Dontrez Pettis will improve FOTO score from 30 to 66 as a proxy for functional improvement    Baseline 49%    Status On-going    Target Date 11/02/20  Plan - 08/27/20 1051     Clinical Impression Statement PT was completed for R RC and  periscapular strengthening. Pt tolerated the progression of strengthening exs. R shoulder PROM, AAROM, and AROM continue to improve. See LTGs. Pt will continue to benefit from PT to address ROM and strength to optimize functional use of the R UE.    Personal Factors and Comorbidities Fitness;Comorbidity 3+;Behavior Pattern    Comorbidities obesity, CAD, HTN, tachycardia, DM, HA    Examination-Activity Limitations Bathing;Carry;Dressing;Lift;Reach Overhead    Examination-Participation Restrictions Cleaning;Community Activity;Driving    Stability/Clinical Decision Making Stable/Uncomplicated    Clinical Decision Making Low    Rehab Potential Good    PT Frequency 2x / week    PT Duration 8 weeks    PT Treatment/Interventions ADLs/Self Care Home Management;Cryotherapy;Electrical Stimulation;Ultrasound;Moist Heat;Iontophoresis 4mg /ml Dexamethasone;Gait training;Stair training;Functional mobility training;Therapeutic activities;Therapeutic exercise;Balance training;Patient/family education;Manual techniques;Dry needling;Passive range of motion;Taping;Vasopneumatic Device;Canalith Repostioning    PT Next Visit Plan Response AAROM and strengthening for the periscapular, rotator cuff, lats, and traps as tolerated    PT Home Exercise Plan [email or web address]    Consulted and Agree with Plan of Care Patient             Patient will benefit from skilled therapeutic intervention in order to improve the following deficits and impairments:  Decreased range of motion, Obesity, Decreased activity tolerance, Pain, Decreased strength, Decreased balance, Difficulty walking, Decreased knowledge of precautions, Impaired UE functional use  Visit Diagnosis: Right shoulder pain, unspecified chronicity  Muscle weakness  Abnormal posture  Decreased range of motion of left ankle     Problem List Patient Active Problem List   Diagnosis Date Noted   Fatigue due to excessive exertion 08/13/2020   Complete tear of right  rotator cuff    Subluxation of tendon of long head of biceps    Surgery, elective 05/09/2020   Gastrocnemius strain, left 03/23/2020   Venous incompetence 12/28/2019   Exertional dyspnea 11/08/2019   Chronic right shoulder pain 09/02/2019   Diabetic neuropathy (HCC) 08/16/2019   Chest pain of uncertain etiology 04/27/2019   CAD -S/P PCI 01/02/2019   Obesity (BMI 30-39.9) 01/02/2019   Orthostatic dizziness 01/02/2019   Hyperlipidemia LDL goal <70 12/25/2018   History of non-ST elevation myocardial infarction (NSTEMI) 12/23/2018   Type 2 diabetes mellitus with complication (HCC)    Tachycardia    Hypertension    13/07/2018 MS, PT 08/27/20 1:20 PM   [facility] Outpatient Rehabilitation [facility] [address] [place], [place], [place] Phone: [phone]   Fax:  [phone]  Name: Hanif Radin MRN: [name] Date of Birth: 09/15/1955

## 2020-09-04 ENCOUNTER — Ambulatory Visit: Payer: 59

## 2020-09-04 ENCOUNTER — Other Ambulatory Visit: Payer: Self-pay

## 2020-09-04 ENCOUNTER — Other Ambulatory Visit: Payer: Self-pay | Admitting: Family Medicine

## 2020-09-04 DIAGNOSIS — M25511 Pain in right shoulder: Secondary | ICD-10-CM | POA: Diagnosis not present

## 2020-09-04 DIAGNOSIS — R293 Abnormal posture: Secondary | ICD-10-CM

## 2020-09-04 DIAGNOSIS — M6281 Muscle weakness (generalized): Secondary | ICD-10-CM

## 2020-09-04 DIAGNOSIS — M25672 Stiffness of left ankle, not elsewhere classified: Secondary | ICD-10-CM

## 2020-09-04 NOTE — Therapy (Signed)
[facility] Outpatient Rehabilitation [facility] [address] [place], [place], [address] Phone: [phone]   Fax:  [phone]  Physical Therapy Treatment  Patient Details  Name: Remmy Riffe MRN: [id number] Date of Birth: October 10, 1955 Referring Provider (PT): August Saucer Corrie Mckusick, MD   Encounter Date: 09/04/2020   PT End of Session - 09/04/20 0940     Visit Number 12    Number of Visits 16    Date for PT Re-Evaluation 11/02/20    Authorization Type [employer]    Authorization - Number of Visits 30    Progress Note Due on Visit 20    PT Start Time 0930    PT Stop Time 1025    PT Time Calculation (min) 55 min    Activity Tolerance Patient tolerated treatment well;Patient limited by pain    Behavior During Therapy [facility] for tasks assessed/performed             Past Medical History:  Diagnosis Date   Arthritis    Back pain    CAD (coronary artery disease)    a. s/p NSTEMI in 12/2018 with 80% Prox-RCA stenosis followed by 100% prox to mid-RCA stenosis --> DESx2 to the RCA. Med management of residual disease.    Carpal tunnel syndrome    DDD (degenerative disc disease)    Diabetes mellitus    type 2   Hyperlipidemia    Hypertension    states very mild   Low testosterone    Myocardial infarction (HCC)    Overweight    Seasonal allergies    Tachycardia     Past Surgical History:  Procedure Laterality Date   CARDIAC CATHETERIZATION     CARPAL TUNNEL RELEASE  07/02/2011   Procedure: CARPAL TUNNEL RELEASE;  Surgeon: Wyn Forster., MD;  Location: [facility];  Service: Orthopedics;  Laterality: Right;   CARPAL TUNNEL RELEASE  07/30/2011   Procedure: CARPAL TUNNEL RELEASE;  Surgeon: Wyn Forster., MD;  Location: [facility];  Service: Orthopedics;  Laterality: Left;   CORONARY ANGIOPLASTY     CORONARY STENT INTERVENTION N/A 12/23/2018   Procedure: CORONARY STENT INTERVENTION;  Surgeon: Corky Crafts, MD;   Location: [facility] INVASIVE CV LAB;  Service: Cardiovascular;  Laterality: N/A;   LEFT HEART CATH AND CORONARY ANGIOGRAPHY N/A 12/23/2018   Procedure: LEFT HEART CATH AND CORONARY ANGIOGRAPHY;  Surgeon: Corky Crafts, MD;  Location: [facility] INVASIVE CV LAB;  Service: Cardiovascular;  Laterality: N/A;   LUMBAR EPIDURAL INJECTION     x2   SHOULDER ARTHROSCOPY WITH OPEN ROTATOR CUFF REPAIR AND DISTAL CLAVICLE ACROMINECTOMY Right 06/03/2020   Procedure: right shoulder arthroscopy, biceps tenodesis, mini open rotator cuff tear repair subscap/supraspinatus, lateral debridement;  Surgeon: Cammy Copa, MD;  Location: [facility] OR;  Service: Orthopedics;  Laterality: Right;    There were no vitals filed for this visit.   Subjective Assessment - 09/04/20 0936     Subjective Pt reports improving pain and use of the R UE.    Pertinent History R/C repair (supraspinatus and subscap) with biceps tenodesis on 06/03/2020, heart attack 12/2020, DMT II    Patient Stated Goals get shoulder better    Currently in Pain? Yes    Pain Score 4     Pain Location Shoulder    Pain Orientation Right    Pain Descriptors / Indicators Aching;Tightness    Pain Type Chronic pain    Pain Onset More than a month ago  Pain Frequency Intermittent                [facility] PT Assessment - 09/04/20 0001       AROM   AROM Assessment Site Shoulder    Right/Left Shoulder Right    Right Shoulder Flexion 85 Degrees   145d c finger wall climb; With active shoulder flexion pt starts to shrug                          [facility] Adult PT Treatment/Exercise - 09/04/20 0001       Exercises   Exercises Shoulder      Shoulder Exercises: Supine   Other Supine Exercises Dynamic stabilizations small ROM c 1#, 30 secx3      Shoulder Exercises: Standing   External Rotation Limitations RTB - 3x10    Internal Rotation Limitations --    Flexion Both;5 reps   2 sets   Flexion Limitations R limited to development of  shoulder shrug    Row Both;10 reps    Theraband Level (Shoulder Row) Level 3 (Green)      Shoulder Exercises: Pulleys   Flexion 2 minutes    Scaption 2 minutes      Shoulder Exercises: ROM/Strengthening   Other ROM/Strengthening Exercises finger walk on ladder- 5x      Cryotherapy   Number Minutes Cryotherapy 10 Minutes    Cryotherapy Location Shoulder   R   Type of Cryotherapy Ice pack      Manual Therapy   Passive ROM PROM stretches R shoulder flexion, abd, ER, IR 20 sec in duration                      PT Short Term Goals - 06/28/20 1128       PT SHORT TERM GOAL #1   Title [name] will be >75% HEP compliant within 3 weeks to facilitate carryover between sessions and move towards eventual independent management of their condition.    Baseline Started on eval    Time 3    Period Weeks    Status New    Target Date 07/19/20               PT Long Term Goals - 08/27/20 1257       PT LONG TERM GOAL #1   Title [name] will achieve >=60 degrees of shoulder ER at 90 degrees to allow proper shoulder mechanics during OH movement.    Baseline 10 at neutral    Status On-going    Target Date 11/02/20      PT LONG TERM GOAL #2   Title [name] will demonstrate >130 degrees of active ROM, not limited by pain, in abduction and flexion to allow completion of activities involving reaching OH. 08/15/20: PROM= 145d, AAROM=130d, AROM=85d    Baseline 45 passive flexoin    Status On-going    Target Date 11/02/20      PT LONG TERM GOAL #3   Title Jean Skow will be able to reach [facility] into cabinet, not limited by pain    Baseline unable    Status On-going    Target Date 11/02/20      PT LONG TERM GOAL #4   Title Dalante Minus will be able to resume driving, not limited by pain    Status On-going    Target Date 11/02/20      PT LONG TERM GOAL #5   Title  Skyler Carel will be compliant with post op precautions throughout therapy.    Baseline non  compliant    Status On-going    Target Date 11/02/20      PT LONG TERM GOAL #6   Title Giankarlo Leamer will improve FOTO score from 30 to 66 as a proxy for functional improvement    Baseline 49%    Status On-going    Target Date 11/02/20                   Plan - 09/04/20 0957     Clinical Impression Statement PT was completed for R shoulder strengthening of the R rotator cuff and periscapular musculature and for GH joint PROM, AAROM and AROM. AAROM for shoulder flexion has Improved since the last session by 15d while AROM is the same due to rotator cuff weakness. Pt starts to shrug his R shoulder as he approaches 85d of flexion. A cold pack was applied at end of the session for symptom management.    Personal Factors and Comorbidities Fitness;Comorbidity 3+;Behavior Pattern    Comorbidities obesity, CAD, HTN, tachycardia, DM, HA    Examination-Activity Limitations Bathing;Carry;Dressing;Lift;Reach Overhead    Examination-Participation Restrictions Cleaning;Community Activity;Driving    Stability/Clinical Decision Making Stable/Uncomplicated    Clinical Decision Making Low    Rehab Potential Good    PT Frequency 2x / week    PT Duration 8 weeks    PT Treatment/Interventions ADLs/Self Care Home Management;Cryotherapy;Electrical Stimulation;Ultrasound;Moist Heat;Iontophoresis 4mg /ml Dexamethasone;Gait training;Stair training;Functional mobility training;Therapeutic activities;Therapeutic exercise;Balance training;Patient/family education;Manual techniques;Dry needling;Passive range of motion;Taping;Vasopneumatic Device;Canalith Repostioning    PT Next Visit Plan Progress strengthening for the periscapular and rotator cuff as tolerated. Review HEP.    PT Home Exercise Plan [email or web address]    Consulted and Agree with Plan of Care Patient             Patient will benefit from skilled therapeutic intervention in order to improve the following deficits and impairments:  Decreased range of  motion, Obesity, Decreased activity tolerance, Pain, Decreased strength, Decreased balance, Difficulty walking, Decreased knowledge of precautions, Impaired UE functional use  Visit Diagnosis: Right shoulder pain, unspecified chronicity  Muscle weakness  Decreased range of motion of left ankle  Muscle weakness (generalized)  Abnormal posture     Problem List Patient Active Problem List   Diagnosis Date Noted   Fatigue due to excessive exertion 08/13/2020   Complete tear of right rotator cuff    Subluxation of tendon of long head of biceps    Surgery, elective 05/09/2020   Gastrocnemius strain, left 03/23/2020   Venous incompetence 12/28/2019   Exertional dyspnea 11/08/2019   Chronic right shoulder pain 09/02/2019   Diabetic neuropathy (HCC) 08/16/2019   Chest pain of uncertain etiology 04/27/2019   CAD -S/P PCI 01/02/2019   Obesity (BMI 30-39.9) 01/02/2019   Orthostatic dizziness 01/02/2019   Hyperlipidemia LDL goal <70 12/25/2018   History of non-ST elevation myocardial infarction (NSTEMI) 12/23/2018   Type 2 diabetes mellitus with complication (HCC)    Tachycardia    Hypertension     13/07/2018 MS, PT 09/04/20 10:53 AM   [facility] [address] [place], [place], [place] Phone: [phone]   Fax:  [phone]  Name: Coltrane Tugwell MRN: [name] Date of Birth: [birth year]

## 2020-09-06 ENCOUNTER — Ambulatory Visit: Payer: 59

## 2020-09-06 ENCOUNTER — Other Ambulatory Visit: Payer: Self-pay

## 2020-09-06 DIAGNOSIS — M25672 Stiffness of left ankle, not elsewhere classified: Secondary | ICD-10-CM

## 2020-09-06 DIAGNOSIS — M6281 Muscle weakness (generalized): Secondary | ICD-10-CM

## 2020-09-06 DIAGNOSIS — R293 Abnormal posture: Secondary | ICD-10-CM

## 2020-09-06 DIAGNOSIS — M25511 Pain in right shoulder: Secondary | ICD-10-CM | POA: Diagnosis not present

## 2020-09-06 NOTE — Therapy (Signed)
[facility] Outpatient Rehabilitation [facility] [address] [place], [place], [address] Phone: [phone]   Fax:  [phone]  Physical Therapy Treatment  Patient Details  Name: Keith Newton MRN: [id number] Date of Birth: 1955/09/05 Referring Provider (PT): August Saucer Corrie Mckusick, MD   Encounter Date: 09/06/2020   PT End of Session - 09/06/20 1027     Visit Number 13    Number of Visits 16    Date for PT Re-Evaluation 11/02/20    Authorization Type [employer]    Authorization - Number of Visits 30    Progress Note Due on Visit 20    PT Start Time 1021    PT Stop Time 1103    PT Time Calculation (min) 42 min    Activity Tolerance Patient tolerated treatment well;Patient limited by pain    Behavior During Therapy [facility] for tasks assessed/performed             Past Medical History:  Diagnosis Date   Arthritis    Back pain    CAD (coronary artery disease)    a. s/p NSTEMI in 12/2018 with 80% Prox-RCA stenosis followed by 100% prox to mid-RCA stenosis --> DESx2 to the RCA. Med management of residual disease.    Carpal tunnel syndrome    DDD (degenerative disc disease)    Diabetes mellitus    type 2   Hyperlipidemia    Hypertension    states very mild   Low testosterone    Myocardial infarction (HCC)    Overweight    Seasonal allergies    Tachycardia     Past Surgical History:  Procedure Laterality Date   CARDIAC CATHETERIZATION     CARPAL TUNNEL RELEASE  07/02/2011   Procedure: CARPAL TUNNEL RELEASE;  Surgeon: Wyn Forster., MD;  Location: [facility];  Service: Orthopedics;  Laterality: Right;   CARPAL TUNNEL RELEASE  07/30/2011   Procedure: CARPAL TUNNEL RELEASE;  Surgeon: Wyn Forster., MD;  Location: [facility];  Service: Orthopedics;  Laterality: Left;   CORONARY ANGIOPLASTY     CORONARY STENT INTERVENTION N/A 12/23/2018   Procedure: CORONARY STENT INTERVENTION;  Surgeon: Corky Crafts, MD;   Location: [facility] INVASIVE CV LAB;  Service: Cardiovascular;  Laterality: N/A;   LEFT HEART CATH AND CORONARY ANGIOGRAPHY N/A 12/23/2018   Procedure: LEFT HEART CATH AND CORONARY ANGIOGRAPHY;  Surgeon: Corky Crafts, MD;  Location: [facility] INVASIVE CV LAB;  Service: Cardiovascular;  Laterality: N/A;   LUMBAR EPIDURAL INJECTION     x2   SHOULDER ARTHROSCOPY WITH OPEN ROTATOR CUFF REPAIR AND DISTAL CLAVICLE ACROMINECTOMY Right 06/03/2020   Procedure: right shoulder arthroscopy, biceps tenodesis, mini open rotator cuff tear repair subscap/supraspinatus, lateral debridement;  Surgeon: Cammy Copa, MD;  Location: [facility];  Service: Orthopedics;  Laterality: Right;    There were no vitals filed for this visit.                      [facility] Adult PT Treatment/Exercise - 09/06/20 0001       Exercises   Exercises Shoulder      Shoulder Exercises: Supine   Protraction Both;10 reps   3 sets   Protraction Weight (lbs) 3    Other Supine Exercises Dynamic stabilizations c manual resistance      Shoulder Exercises: Sidelying   External Rotation Right;10 reps   3 sets   External Rotation Weight (lbs) 2    Flexion Right;10 reps  3 sets   ABduction Right;10 reps   3 sets   ABduction Limitations flexed elbow                    PT Education - 09/06/20 1335     Education Details Updated HEP    Person(s) Educated Patient    Methods Explanation;Demonstration;Tactile cues;Verbal cues;Handout    Comprehension Verbalized understanding;Returned demonstration;Verbal cues required;Tactile cues required;Need further instruction              PT Short Term Goals - 06/28/20 1128       PT SHORT TERM GOAL #1   Title [name] will be >75% HEP compliant within 3 weeks to facilitate carryover between sessions and move towards eventual independent management of their condition.    Baseline Started on eval    Time 3    Period Weeks    Status New    Target Date 07/19/20                PT Long Term Goals - 08/27/20 1257       PT LONG TERM GOAL #1   Title [name] will achieve >=60 degrees of shoulder ER at 90 degrees to allow proper shoulder mechanics during OH movement.    Baseline 10 at neutral    Status On-going    Target Date 11/02/20      PT LONG TERM GOAL #2   Title [name] will demonstrate >130 degrees of active ROM, not limited by pain, in abduction and flexion to allow completion of activities involving reaching OH. 08/15/20: PROM= 145d, AAROM=130d, AROM=85d    Baseline 45 passive flexoin    Status On-going    Target Date 11/02/20      PT LONG TERM GOAL #3   Title Salah Nakamura will be able to reach [facility] into cabinet, not limited by pain    Baseline unable    Status On-going    Target Date 11/02/20      PT LONG TERM GOAL #4   Title Peyten Weare will be able to resume driving, not limited by pain    Status On-going    Target Date 11/02/20      PT LONG TERM GOAL #5   Title Caliber Landess will be compliant with post op precautions throughout therapy.    Baseline non compliant    Status On-going    Target Date 11/02/20      PT LONG TERM GOAL #6   Title Kristi Hyer will improve FOTO score from 30 to 66 as a proxy for functional improvement    Baseline 49%    Status On-going    Target Date 11/02/20                   Plan - 09/06/20 1028     Clinical Impression Statement PT was continued for RC and periscapular strengthening. With shoulder flexion in standing, shrugging develops. Shrugging is not present in a gravity eliminated postion of sidelying. Will continue to progress strengthening ar tolerated.    Personal Factors and Comorbidities Fitness;Comorbidity 3+;Behavior Pattern    Comorbidities obesity, CAD, HTN, tachycardia, DM, HA    Examination-Activity Limitations Bathing;Carry;Dressing;Lift;Reach Overhead    Examination-Participation Restrictions Cleaning;Community Activity;Driving    Stability/Clinical  Decision Making Stable/Uncomplicated    Clinical Decision Making Low    Rehab Potential Good    PT Frequency 2x / week    PT Duration 8 weeks    PT Treatment/Interventions ADLs/Self Care  Home Management;Cryotherapy;Electrical Stimulation;Ultrasound;Moist Heat;Iontophoresis 4mg /ml Dexamethasone;Gait training;Stair training;Functional mobility training;Therapeutic activities;Therapeutic exercise;Balance training;Patient/family education;Manual techniques;Dry needling;Passive range of motion;Taping;Vasopneumatic Device;Canalith Repostioning    PT Next Visit Plan Progress strengthening for the periscapular and rotator cuff as tolerated.    PT Home Exercise Plan [email or web address]. pt is to complete HEP every other day.    Consulted and Agree with Plan of Care Patient             Patient will benefit from skilled therapeutic intervention in order to improve the following deficits and impairments:  Decreased range of motion, Obesity, Decreased activity tolerance, Pain, Decreased strength, Decreased balance, Difficulty walking, Decreased knowledge of precautions, Impaired UE functional use  Visit Diagnosis: Right shoulder pain, unspecified chronicity  Muscle weakness  Decreased range of motion of left ankle  Muscle weakness (generalized)  Abnormal posture     Problem List Patient Active Problem List   Diagnosis Date Noted   Fatigue due to excessive exertion 08/13/2020   Complete tear of right rotator cuff    Subluxation of tendon of long head of biceps    Surgery, elective 05/09/2020   Gastrocnemius strain, left 03/23/2020   Venous incompetence 12/28/2019   Exertional dyspnea 11/08/2019   Chronic right shoulder pain 09/02/2019   Diabetic neuropathy (HCC) 08/16/2019   Chest pain of uncertain etiology 04/27/2019   CAD -S/P PCI 01/02/2019   Obesity (BMI 30-39.9) 01/02/2019   Orthostatic dizziness 01/02/2019   Hyperlipidemia LDL goal <70 12/25/2018   History of non-ST elevation myocardial  infarction (NSTEMI) 12/23/2018   Type 2 diabetes mellitus with complication (HCC)    Tachycardia    Hypertension    13/07/2018 MS, PT 09/06/20 1:51 PM   [facility] Outpatient Rehabilitation [facility] [address] [place], [place], [place] Phone: [phone]   Fax:  [phone]  Name: Markees Carns MRN: [name] Date of Birth: [birth year]

## 2020-09-10 ENCOUNTER — Ambulatory Visit: Payer: 59 | Admitting: Physical Therapy

## 2020-09-10 ENCOUNTER — Encounter: Payer: Self-pay | Admitting: Physical Therapy

## 2020-09-10 ENCOUNTER — Other Ambulatory Visit: Payer: Self-pay

## 2020-09-10 DIAGNOSIS — M25511 Pain in right shoulder: Secondary | ICD-10-CM

## 2020-09-10 DIAGNOSIS — M6281 Muscle weakness (generalized): Secondary | ICD-10-CM

## 2020-09-10 DIAGNOSIS — M25672 Stiffness of left ankle, not elsewhere classified: Secondary | ICD-10-CM

## 2020-09-10 NOTE — Therapy (Signed)
[facility] [address] [place], [place], [address] Phone: [phone]   Fax:  [phone]  Physical Therapy Treatment  Patient Details  Name: Keith [facility] MRN: [id number] Date of Birth: 02-Jun-1955 Referring Provider (PT): August Saucer Corrie Mckusick, MD   Encounter Date: 09/10/2020   PT End of Session - 09/10/20 1045     Visit Number 14    Number of Visits 16    Date for PT Re-Evaluation 11/02/20    Authorization Type [employer]    Authorization - Number of Visits 30    Progress Note Due on Visit 20    PT Start Time 1045    PT Stop Time 1127    PT Time Calculation (min) 42 min    Activity Tolerance Patient tolerated treatment well;Patient limited by pain    Behavior During Therapy [facility] for tasks assessed/performed             Past Medical History:  Diagnosis Date   Arthritis    Back pain    CAD (coronary artery disease)    a. s/p NSTEMI in 12/2018 with 80% Prox-RCA stenosis followed by 100% prox to mid-RCA stenosis --> DESx2 to the RCA. Med management of residual disease.    Carpal tunnel syndrome    DDD (degenerative disc disease)    Diabetes mellitus    type 2   Hyperlipidemia    Hypertension    states very mild   Low testosterone    Myocardial infarction (HCC)    Overweight    Seasonal allergies    Tachycardia     Past Surgical History:  Procedure Laterality Date   CARDIAC CATHETERIZATION     CARPAL TUNNEL RELEASE  07/02/2011   Procedure: CARPAL TUNNEL RELEASE;  Surgeon: Wyn Forster., MD;  Location: [facility];  Service: Orthopedics;  Laterality: Right;   CARPAL TUNNEL RELEASE  07/30/2011   Procedure: CARPAL TUNNEL RELEASE;  Surgeon: Wyn Forster., MD;  Location: [facility];  Service: Orthopedics;  Laterality: Left;   CORONARY ANGIOPLASTY     CORONARY STENT INTERVENTION N/A 12/23/2018   Procedure: CORONARY STENT INTERVENTION;  Surgeon: Corky Crafts, MD;   Location: [facility]ASIVE CV LAB;  Service: Cardiovascular;  Laterality: N/A;   LEFT HEART CATH AND CORONARY ANGIOGRAPHY N/A 12/23/2018   Procedure: LEFT HEART CATH AND CORONARY ANGIOGRAPHY;  Surgeon: Corky Crafts, MD;  Location: [facility];  Service: Cardiovascular;  Laterality: N/A;   LUMBAR EPIDURAL INJECTION     x2   SHOULDER ARTHROSCOPY WITH OPEN ROTATOR CUFF REPAIR AND DISTAL CLAVICLE ACROMINECTOMY Right 06/03/2020   Procedure: right shoulder arthroscopy, biceps tenodesis, mini open rotator cuff tear repair subscap/supraspinatus, lateral debridement;  Surgeon: Cammy Copa, MD;  Location: [facility];  Service: Orthopedics;  Laterality: Right;      There were no vitals filed for this visit.   Subjective Assessment - 09/10/20 1051     Subjective Pt reprts that he was having higher pain this morning for unclear reasons 8/10 pain upon waking with current pain 5/10 R shoulder.    Pertinent History R/C repair (supraspinatus and subscap) with biceps tenodesis on 06/03/2020, heart attack 12/2020, DMT II    Patient Stated Goals get shoulder better    Pain Onset More than a month ago                [facility] PT Assessment - 09/10/20 0001       AROM  Right Shoulder Flexion --   123 gravity reduced                          [facility] Adult PT Treatment/Exercise - 09/10/20 0001       Shoulder Exercises: Supine   Flexion Limitations Shoulder flexion in non-painful arc 3x15      Shoulder Exercises: Seated   Row Limitations --      Shoulder Exercises: Sidelying   External Rotation Limitations 3x10 @2 #    ABduction Limitations 3x10      Shoulder Exercises: Standing   Extension Limitations GTB - 3x10    Row Limitations RTB with YTB ER - 3x10      Shoulder Exercises: ROM/Strengthening   UBE (Upper Arm Bike) 5' lvl 0      Shoulder Exercises: Stretch   Other Shoulder Stretches towel slide      Manual Therapy   Manual therapy comments ER/IR and GH joint  mobs (AP and inferior, pt in supine)                      PT Short Term Goals - 06/28/20 1128       PT SHORT TERM GOAL #1   Title 06/30/20 will be >75% HEP compliant within 3 weeks to facilitate carryover between sessions and move towards eventual independent management of their condition.    Baseline Started on eval    Time 3    Period Weeks    Status New    Target Date 07/19/20               PT Long Term Goals - 08/27/20 1257       PT LONG TERM GOAL #1   Title 10/28/20 will achieve >=60 degrees of shoulder ER at 90 degrees to allow proper shoulder mechanics during OH movement.    Baseline 10 at neutral    Status On-going    Target Date 11/02/20      PT LONG TERM GOAL #2   Title 11/04/20 will demonstrate >130 degrees of active ROM, not limited by pain, in abduction and flexion to allow completion of activities involving reaching OH. 08/15/20: PROM= 145d, AAROM=130d, AROM=85d    Baseline 45 passive flexoin    Status On-going    Target Date 11/02/20      PT LONG TERM GOAL #3   Title Keith [facility] will be able to reach [facility] into cabinet, not limited by pain    Baseline unable    Status On-going    Target Date 11/02/20      PT LONG TERM GOAL #4   Title Keith Newton will be able to resume driving, not limited by pain    Status On-going    Target Date 11/02/20      PT LONG TERM GOAL #5   Title Keith Newton will be compliant with post op precautions throughout therapy.    Baseline non compliant    Status On-going    Target Date 11/02/20      PT LONG TERM GOAL #6   Title Keith Newton will improve FOTO score from 30 to 66 as a proxy for functional improvement    Baseline 49%    Status On-going    Target Date 11/02/20                   Plan - 09/10/20 1105     Clinical Impression Statement Pt reports  a mild increase in pain following therapy  HEP was reviewed with patient, but left unchanged    Overall, Keith [facility] is  progressing fair with therapy.  Today we concentrated on rotator cuff strengthening and periscapular strengthening.  Pt remains limited by pain, but reports some increase in function with light activities at home.  Pt will continue to benefit from skilled physical therapy to address remaining deficits and achieve listed goals.  Continue per POC.    Personal Factors and Comorbidities Fitness;Comorbidity 3+;Behavior Pattern    Comorbidities obesity, CAD, HTN, tachycardia, DM, HA    Examination-Activity Limitations Bathing;Carry;Dressing;Lift;Reach Overhead    Examination-Participation Restrictions Cleaning;Community Activity;Driving    Stability/Clinical Decision Making Stable/Uncomplicated    Rehab Potential Good    PT Frequency 2x / week    PT Duration 8 weeks    PT Treatment/Interventions ADLs/Self Care Home Management;Cryotherapy;Electrical Stimulation;Ultrasound;Moist Heat;Iontophoresis 4mg /ml Dexamethasone;Gait training;Stair training;Functional mobility training;Therapeutic activities;Therapeutic exercise;Balance training;Patient/family education;Manual techniques;Dry needling;Passive range of motion;Taping;Vasopneumatic Device;Canalith Repostioning    PT Next Visit Plan Progress strengthening for the periscapular and rotator cuff as tolerated.    PT Home Exercise Plan [email or web address]. pt is to complete HEP every other day.    Consulted and Agree with Plan of Care Patient             Patient will benefit from skilled therapeutic intervention in order to improve the following deficits and impairments:  Decreased range of motion, Obesity, Decreased activity tolerance, Pain, Decreased strength, Decreased balance, Difficulty walking, Decreased knowledge of precautions, Impaired UE functional use  Visit Diagnosis: Right shoulder pain, unspecified chronicity  Muscle weakness  Decreased range of motion of left ankle  Muscle weakness (generalized)     Problem List Patient Active Problem List    Diagnosis Date Noted   Fatigue due to excessive exertion 08/13/2020   Complete tear of right rotator cuff    Subluxation of tendon of long head of biceps    Surgery, elective 05/09/2020   Gastrocnemius strain, left 03/23/2020   Venous incompetence 12/28/2019   Exertional dyspnea 11/08/2019   Chronic right shoulder pain 09/02/2019   Diabetic neuropathy (HCC) 08/16/2019   Chest pain of uncertain etiology 04/27/2019   CAD -S/P PCI 01/02/2019   Obesity (BMI 30-39.9) 01/02/2019   Orthostatic dizziness 01/02/2019   Hyperlipidemia LDL goal <70 12/25/2018   History of non-ST elevation myocardial infarction (NSTEMI) 12/23/2018   Type 2 diabetes mellitus with complication (HCC)    Tachycardia    Hypertension     13/07/2018 09/10/2020, 11:27 AM  Ke[facility] [address] [place], [place], [place] Phone: [phone]   Fax:  [phone]  Name: Keith [facility] MRN: [name] Date of Birth: September 03, 1955

## 2020-09-12 ENCOUNTER — Ambulatory Visit: Payer: 59

## 2020-09-12 ENCOUNTER — Other Ambulatory Visit: Payer: Self-pay

## 2020-09-12 DIAGNOSIS — R293 Abnormal posture: Secondary | ICD-10-CM

## 2020-09-12 DIAGNOSIS — M25672 Stiffness of left ankle, not elsewhere classified: Secondary | ICD-10-CM

## 2020-09-12 DIAGNOSIS — M6281 Muscle weakness (generalized): Secondary | ICD-10-CM

## 2020-09-12 DIAGNOSIS — M25511 Pain in right shoulder: Secondary | ICD-10-CM

## 2020-09-12 NOTE — Therapy (Signed)
[facility] [address] [place], [place], [address] Phone: [phone]   Fax:  [phone]  Physical Therapy Treatment  Patient Details  Name: Keith [facility] MRN: [id number] Date of Birth: 02/16/1956 Referring Provider (PT): August Saucer Corrie Mckusick, MD   Encounter Date: 09/12/2020   PT End of Session - 09/12/20 1104     Visit Number 15    Number of Visits 16    Date for PT Re-Evaluation 11/02/20    Authorization Type [employer]    Authorization - Number of Visits 30    Progress Note Due on Visit 20    PT Start Time [phone]    PT Stop Time 1025    PT Time Calculation (min) 49 min    Activity Tolerance Patient tolerated treatment well;Patient limited by pain    Behavior During Therapy [facility] for tasks assessed/performed             Past Medical History:  Diagnosis Date   Arthritis    Back pain    CAD (coronary artery disease)    a. s/p NSTEMI in 12/2018 with 80% Prox-RCA stenosis followed by 100% prox to mid-RCA stenosis --> DESx2 to the RCA. Med management of residual disease.    Carpal tunnel syndrome    DDD (degenerative disc disease)    Diabetes mellitus    type 2   Hyperlipidemia    Hypertension    states very mild   Low testosterone    Myocardial infarction (HCC)    Overweight    Seasonal allergies    Tachycardia     Past Surgical History:  Procedure Laterality Date   CARDIAC CATHETERIZATION     CARPAL TUNNEL RELEASE  07/02/2011   Procedure: CARPAL TUNNEL RELEASE;  Surgeon: Wyn Forster., MD;  Location: [facility];  Service: Orthopedics;  Laterality: Right;   CARPAL TUNNEL RELEASE  07/30/2011   Procedure: CARPAL TUNNEL RELEASE;  Surgeon: Wyn Forster., MD;  Location: [facility];  Service: Orthopedics;  Laterality: Left;   CORONARY ANGIOPLASTY     CORONARY STENT INTERVENTION N/A 12/23/2018   Procedure: CORONARY STENT INTERVENTION;  Surgeon: Corky Crafts, MD;   Location: [facility] INVASIVE CV LAB;  Service: Cardiovascular;  Laterality: N/A;   LEFT HEART CATH AND CORONARY ANGIOGRAPHY N/A 12/23/2018   Procedure: LEFT HEART CATH AND CORONARY ANGIOGRAPHY;  Surgeon: Corky Crafts, MD;  Location: [facility] INVASIVE CV LAB;  Service: Cardiovascular;  Laterality: N/A;   LUMBAR EPIDURAL INJECTION     x2   SHOULDER ARTHROSCOPY WITH OPEN ROTATOR CUFF REPAIR AND DISTAL CLAVICLE ACROMINECTOMY Right 06/03/2020   Procedure: right shoulder arthroscopy, biceps tenodesis, mini open rotator cuff tear repair subscap/supraspinatus, lateral debridement;  Surgeon: Cammy Copa, MD;  Location: [facility];  Service: Orthopedics;  Laterality: Right;    There were no vitals filed for this visit.   Subjective Assessment - 09/12/20 0938     Subjective Pt reports his R shoulder is bothering him more today and not sure why.    Pertinent History R/C repair (supraspinatus and subscap) with biceps tenodesis on 06/03/2020, heart attack 12/2020, DMT II    Limitations Lifting;Reading;Writing;House hold activities;Sitting    Patient Stated Goals get shoulder better    Currently in Pain? Yes    Pain Score 7     Pain Location Shoulder    Pain Orientation Right    Pain Descriptors / Indicators Aching;Tightness    Pain Type Chronic pain  Pain Onset More than a month ago    Pain Frequency Intermittent                               [facility] Adult PT Treatment/Exercise - 09/12/20 0001       Exercises   Exercises Shoulder      Shoulder Exercises: Supine   Protraction Both;5 reps    Protraction Limitations c wand, reps limited due to pain      Shoulder Exercises: Sidelying   External Rotation Right;5 reps    External Rotation Weight (lbs) 2    External Rotation Limitations DCed due to pain      Shoulder Exercises: Standing   Flexion AAROM;Right;12 reps    Flexion Limitations table top, pain near end range. Complete pain free ROM    Extension Both;12 reps   2 sets    Theraband Level (Shoulder Extension) Level 2 (Red)    Row Both;12 reps    Theraband Level (Shoulder Row) Level 2 (Red)      Shoulder Exercises: Pulleys   Flexion 2 minutes    Flexion Limitations pain limited ROM    Scaption 2 minutes    Scaption Limitations pain limited ROM      Cryotherapy   Number Minutes Cryotherapy 5 Minutes    Cryotherapy Location Shoulder   R   Type of Cryotherapy Ice massage      Manual Therapy   Manual Therapy Soft tissue mobilization;Joint mobilization    Manual therapy comments GH joint mobs- Grade 3 AP and inferior, pt in supine    Soft tissue mobilization STM-To the Gh musculature c gentle cross friction massage to the anterior shoulder.                    PT Education - 09/12/20 1101     Education Details Use of cold pack up to every 2 hours for R shoulder pain management. To limit HEP to shoulder rows and extension until R shoulder pain returns to baseline. To call Dr. August Saucer for an appt if pain does not resolve.    Person(s) Educated Patient    Methods Explanation    Comprehension Verbalized understanding              PT Short Term Goals - 06/28/20 1128       PT SHORT TERM GOAL #1   Title Keith [facility] will be >75% HEP compliant within 3 weeks to facilitate carryover between sessions and move towards eventual independent management of their condition.    Baseline Started on eval    Time 3    Period Weeks    Status New    Target Date 07/19/20               PT Long Term Goals - 08/27/20 1257       PT LONG TERM GOAL #1   Title Keith [facility] will achieve >=60 degrees of shoulder ER at 90 degrees to allow proper shoulder mechanics during OH movement.    Baseline 10 at neutral    Status On-going    Target Date 11/02/20      PT LONG TERM GOAL #2   Title Keith Newton will demonstrate >130 degrees of active ROM, not limited by pain, in abduction and flexion to allow completion of activities involving reaching [facility]. 08/15/20:  PROM= 145d, AAROM=130d, AROM=85d    Baseline 45 passive flexoin    Status On-going  Target Date 11/02/20      PT LONG TERM GOAL #3   Title Keith [facility] will be able to reach [facility] into cabinet, not limited by pain    Baseline unable    Status On-going    Target Date 11/02/20      PT LONG TERM GOAL #4   Title Keith Newton will be able to resume driving, not limited by pain    Status On-going    Target Date 11/02/20      PT LONG TERM GOAL #5   Title Keith Newton will be compliant with post op precautions throughout therapy.    Baseline non compliant    Status On-going    Target Date 11/02/20      PT LONG TERM GOAL #6   Title Keith [facility] will improve FOTO score from 30 to 66 as a proxy for functional improvement    Baseline 49%    Status On-going    Target Date 11/02/20                   Plan - 09/12/20 1148     Clinical Impression Statement Pt reports to PT with increased R shoulder pain, primarily of the anterior GH jt area. Pain is most consistently reproduced with GH jt approximation c flexion and abduction with activive, AA, or passive movement. Pt was able to tolerate periscapular strengthening exs. Ice massage was applied at end of session for pain relief. Recommended use of cold pack and limiting HEP until pain improves, see Pt Ed section. Pt is to contact Dr. August Saucer if pain persists.    Personal Factors and Comorbidities Fitness;Comorbidity 3+;Behavior Pattern    Comorbidities obesity, CAD, HTN, tachycardia, DM, HA    Examination-Activity Limitations Bathing;Carry;Dressing;Lift;Reach Overhead    Examination-Participation Restrictions Cleaning;Community Activity;Driving    Stability/Clinical Decision Making Stable/Uncomplicated    Clinical Decision Making Low    Rehab Potential Good    PT Frequency 2x / week    PT Duration 8 weeks    PT Treatment/Interventions ADLs/Self Care Home Management;Cryotherapy;Electrical Stimulation;Ultrasound;Moist Heat;Iontophoresis  4mg /ml Dexamethasone;Gait training;Stair training;Functional mobility training;Therapeutic activities;Therapeutic exercise;Balance training;Patient/family education;Manual techniques;Dry needling;Passive range of motion;Taping;Vasopneumatic Device;Canalith Repostioning    PT Next Visit Plan Assess R shoulder pain status. Progress strengthening for the periscapular and rotator cuff as tolerated.    PT Home Exercise Plan [email or web address].    Consulted and Agree with Plan of Care Patient             Patient will benefit from skilled therapeutic intervention in order to improve the following deficits and impairments:  Decreased range of motion, Obesity, Decreased activity tolerance, Pain, Decreased strength, Decreased balance, Difficulty walking, Decreased knowledge of precautions, Impaired UE functional use  Visit Diagnosis: Right shoulder pain, unspecified chronicity  Muscle weakness  Decreased range of motion of left ankle  Muscle weakness (generalized)  Abnormal posture     Problem List Patient Active Problem List   Diagnosis Date Noted   Fatigue due to excessive exertion 08/13/2020   Complete tear of right rotator cuff    Subluxation of tendon of long head of biceps    Surgery, elective 05/09/2020   Gastrocnemius strain, left 03/23/2020   Venous incompetence 12/28/2019   Exertional dyspnea 11/08/2019   Chronic right shoulder pain 09/02/2019   Diabetic neuropathy (HCC) 08/16/2019   Chest pain of uncertain etiology 04/27/2019   CAD -S/P PCI 01/02/2019   Obesity (BMI 30-39.9) 01/02/2019   Orthostatic dizziness 01/02/2019   Hyperlipidemia LDL goal <70  12/25/2018   History of non-ST elevation myocardial infarction (NSTEMI) 12/23/2018   Type 2 diabetes mellitus with complication (HCC)    Tachycardia    Hypertension     Joellyn RuedAllen Jakylah Bassinger MS, PT 09/12/20 1:40 PM  [facility] [address] [place], [place], [id number] Phone:  [phone]   Fax:  [phone]  Name: Keith ChrisGrady [facility] MRN: [id number] Date of Birth: Jun 29, 1955

## 2020-09-17 ENCOUNTER — Encounter: Payer: Self-pay | Admitting: Physical Therapy

## 2020-09-17 ENCOUNTER — Ambulatory Visit: Payer: 59 | Attending: Family Medicine | Admitting: Physical Therapy

## 2020-09-17 ENCOUNTER — Other Ambulatory Visit: Payer: Self-pay

## 2020-09-17 DIAGNOSIS — R293 Abnormal posture: Secondary | ICD-10-CM | POA: Diagnosis present

## 2020-09-17 DIAGNOSIS — M25511 Pain in right shoulder: Secondary | ICD-10-CM | POA: Insufficient documentation

## 2020-09-17 DIAGNOSIS — M25672 Stiffness of left ankle, not elsewhere classified: Secondary | ICD-10-CM | POA: Insufficient documentation

## 2020-09-17 DIAGNOSIS — M6281 Muscle weakness (generalized): Secondary | ICD-10-CM | POA: Insufficient documentation

## 2020-09-17 NOTE — Therapy (Signed)
[facility] Outpatient Rehabilitation [facility] [address] [place], [place], [address] Phone: [phone]   Fax:  [phone]  Physical Therapy Treatment  Patient Details  Name: Keith Newton MRN: [id number] Date of Birth: 13-Jan-1956 Referring Provider (PT): August Saucer Corrie Mckusick, MD   Encounter Date: 09/17/2020   PT End of Session - 09/17/20 0918     Visit Number 16    Number of Visits 26    Date for PT Re-Evaluation 11/02/20    Authorization Type [employer]    Authorization - Number of Visits 30    Progress Note Due on Visit 20    PT Start Time 0915    PT Stop Time 1000    PT Time Calculation (min) 45 min    Activity Tolerance Patient tolerated treatment well;Patient limited by pain    Behavior During Therapy [facility] for tasks assessed/performed             Past Medical History:  Diagnosis Date   Arthritis    Back pain    CAD (coronary artery disease)    a. s/p NSTEMI in 12/2018 with 80% Prox-RCA stenosis followed by 100% prox to mid-RCA stenosis --> DESx2 to the RCA. Med management of residual disease.    Carpal tunnel syndrome    DDD (degenerative disc disease)    Diabetes mellitus    type 2   Hyperlipidemia    Hypertension    states very mild   Low testosterone    Myocardial infarction (HCC)    Overweight    Seasonal allergies    Tachycardia     Past Surgical History:  Procedure Laterality Date   CARDIAC CATHETERIZATION     CARPAL TUNNEL RELEASE  07/02/2011   Procedure: CARPAL TUNNEL RELEASE;  Surgeon: Wyn Forster., MD;  Location: [facility];  Service: Orthopedics;  Laterality: Right;   CARPAL TUNNEL RELEASE  07/30/2011   Procedure: CARPAL TUNNEL RELEASE;  Surgeon: Wyn Forster., MD;  Location: [facility];  Service: Orthopedics;  Laterality: Left;   CORONARY ANGIOPLASTY     CORONARY STENT INTERVENTION N/A 12/23/2018   Procedure: CORONARY STENT INTERVENTION;  Surgeon: Corky Crafts, MD;   Location: [facility] INVASIVE CV LAB;  Service: Cardiovascular;  Laterality: N/A;   LEFT HEART CATH AND CORONARY ANGIOGRAPHY N/A 12/23/2018   Procedure: LEFT HEART CATH AND CORONARY ANGIOGRAPHY;  Surgeon: Corky Crafts, MD;  Location: [facility] INVASIVE CV LAB;  Service: Cardiovascular;  Laterality: N/A;   LUMBAR EPIDURAL INJECTION     x2   SHOULDER ARTHROSCOPY WITH OPEN ROTATOR CUFF REPAIR AND DISTAL CLAVICLE ACROMINECTOMY Right 06/03/2020   Procedure: right shoulder arthroscopy, biceps tenodesis, mini open rotator cuff tear repair subscap/supraspinatus, lateral debridement;  Surgeon: Cammy Copa, MD;  Location: [facility];  Service: Orthopedics;  Laterality: Right;    There were no vitals filed for this visit.   Subjective Assessment - 09/17/20 0925     Subjective Pt reports that his R shoulder continues to hurt.  He rates it a 6/10 today.  He plans on going back to the MD to follow up on his shoulder.    Pertinent History R/C repair (supraspinatus and subscap) with biceps tenodesis on 06/03/2020, heart attack 12/2020, DMT II    Limitations Lifting;Reading;Writing;House hold activities;Sitting    Patient Stated Goals get shoulder better    Pain Onset More than a month ago  [facility] PT Assessment - 09/17/20 0001       PROM   Right Shoulder Flexion 125 Degrees                           [facility] Adult PT Treatment/Exercise - 09/17/20 0001       Shoulder Exercises: Standing   Extension Limitations Red TB - 3x10    Row Limitations Red TB - 3x10      Shoulder Exercises: ROM/Strengthening   UBE (Upper Arm Bike) 5' L 0    Pendulum 30''x4    Other ROM/Strengthening Exercises ball circles on wall - 20x CW/CC x2    Other ROM/Strengthening Exercises finger ladder      Manual Therapy   Manual therapy comments GH joint mobs- Grade 3 AP and inferior, pt in supine    Passive ROM flexion and scaption to tolerance                      PT Short Term  Goals - 06/28/20 1128       PT SHORT TERM GOAL #1   Title Keith Newton will be >75% HEP compliant within 3 weeks to facilitate carryover between sessions and move towards eventual independent management of their condition.    Baseline Started on eval    Time 3    Period Weeks    Status New    Target Date 07/19/20               PT Long Term Goals - 08/27/20 1257       PT LONG TERM GOAL #1   Title Keith Newton will achieve >=60 degrees of shoulder ER at 90 degrees to allow proper shoulder mechanics during OH movement.    Baseline 10 at neutral    Status On-going    Target Date 11/02/20      PT LONG TERM GOAL #2   Title Keith Newton will demonstrate >130 degrees of active ROM, not limited by pain, in abduction and flexion to allow completion of activities involving reaching OH. 08/15/20: PROM= 145d, AAROM=130d, AROM=85d    Baseline 45 passive flexoin    Status On-going    Target Date 11/02/20      PT LONG TERM GOAL #3   Title Keith Newton will be able to reach [facility] into cabinet, not limited by pain    Baseline unable    Status On-going    Target Date 11/02/20      PT LONG TERM GOAL #4   Title Keith Newton will be able to resume driving, not limited by pain    Status On-going    Target Date 11/02/20      PT LONG TERM GOAL #5   Title Keith Newton will be compliant with post op precautions throughout therapy.    Baseline non compliant    Status On-going    Target Date 11/02/20      PT LONG TERM GOAL #6   Title Keith Newton will improve FOTO score from 30 to 66 as a proxy for functional improvement    Baseline 49%    Status On-going    Target Date 11/02/20                   Plan - 09/17/20 1005     Clinical Impression Statement Pt reports a mild increase in pain following therapy  HEP was reviewed, but left unchanged    Overall, Keith Newton is progressing poorly  with therapy.  Today we concentrated on rotator cuff strengthening, periscapular  strengthening, and shoulder range of motion.  Pt remains limited by significant pain passively and actively.  I recommended he follow up with his surgeon.  He is more than 3 months out and still very limited in gentle exercises and function at home.  We will cut back to 1x/week until he follows up with MD.  Pt will continue to benefit from skilled physical therapy to address remaining deficits and achieve listed goals.    Personal Factors and Comorbidities Fitness;Comorbidity 3+;Behavior Pattern    Comorbidities obesity, CAD, HTN, tachycardia, DM, HA    Examination-Activity Limitations Bathing;Carry;Dressing;Lift;Reach Overhead    Examination-Participation Restrictions Cleaning;Community Activity;Driving    Stability/Clinical Decision Making Stable/Uncomplicated    Rehab Potential Good    PT Frequency 2x / week    PT Duration 8 weeks    PT Treatment/Interventions ADLs/Self Care Home Management;Cryotherapy;Electrical Stimulation;Ultrasound;Moist Heat;Iontophoresis 4mg /ml Dexamethasone;Gait training;Stair training;Functional mobility training;Therapeutic activities;Therapeutic exercise;Balance training;Patient/family education;Manual techniques;Dry needling;Passive range of motion;Taping;Vasopneumatic Device;Canalith Repostioning    PT Next Visit Plan Assess R shoulder pain status. Progress strengthening for the periscapular and rotator cuff as tolerated.    PT Home Exercise Plan [email or web address].    Consulted and Agree with Plan of Care Patient             Patient will benefit from skilled therapeutic intervention in order to improve the following deficits and impairments:  Decreased range of motion, Obesity, Decreased activity tolerance, Pain, Decreased strength, Decreased balance, Difficulty walking, Decreased knowledge of precautions, Impaired UE functional use  Visit Diagnosis: Right shoulder pain, unspecified chronicity  Muscle weakness  Decreased range of motion of left ankle  Muscle  weakness (generalized)     Problem List Patient Active Problem List   Diagnosis Date Noted   Fatigue due to excessive exertion 08/13/2020   Complete tear of right rotator cuff    Subluxation of tendon of long head of biceps    Surgery, elective 05/09/2020   Gastrocnemius strain, left 03/23/2020   Venous incompetence 12/28/2019   Exertional dyspnea 11/08/2019   Chronic right shoulder pain 09/02/2019   Diabetic neuropathy (HCC) 08/16/2019   Chest pain of uncertain etiology 04/27/2019   CAD -S/P PCI 01/02/2019   Obesity (BMI 30-39.9) 01/02/2019   Orthostatic dizziness 01/02/2019   Hyperlipidemia LDL goal <70 12/25/2018   History of non-ST elevation myocardial infarction (NSTEMI) 12/23/2018   Type 2 diabetes mellitus with complication (HCC)    Tachycardia    Hypertension     13/07/2018 PT, DPT 09/17/20 10:08 AM  [facility] [address] [place], [place], [place] Phone: [phone]   Fax:  [phone]  Name: Talha Iser MRN: Keith Newton Date of Birth: Nov 02, 1955

## 2020-09-24 ENCOUNTER — Encounter: Payer: 59 | Admitting: Physical Therapy

## 2020-09-26 ENCOUNTER — Ambulatory Visit: Payer: 59

## 2020-09-26 ENCOUNTER — Other Ambulatory Visit: Payer: Self-pay

## 2020-09-26 DIAGNOSIS — M25672 Stiffness of left ankle, not elsewhere classified: Secondary | ICD-10-CM

## 2020-09-26 DIAGNOSIS — M25511 Pain in right shoulder: Secondary | ICD-10-CM | POA: Diagnosis not present

## 2020-09-26 DIAGNOSIS — M6281 Muscle weakness (generalized): Secondary | ICD-10-CM

## 2020-09-26 NOTE — Therapy (Signed)
[facility] [address] [place], [place], [address] Phone: [phone]   Fax:  [phone]  Physical Therapy Treatment  Patient Details  Name: Keith Newton MRN: [id number] Date of Birth: Jun 23, 1955 Referring Provider (PT): August Saucer Corrie Mckusick, MD   Encounter Date: 09/26/2020   PT End of Session - 09/26/20 0933     Visit Number 17    Number of Visits 26    Date for PT Re-Evaluation 11/02/20    Authorization Type [employer]    Authorization - Number of Visits 30    PT Start Time [phone]    PT Stop Time 1025    PT Time Calculation (min) 52 min    Activity Tolerance Patient tolerated treatment well;Patient limited by pain             Past Medical History:  Diagnosis Date   Arthritis    Back pain    CAD (coronary artery disease)    a. s/p NSTEMI in 12/2018 with 80% Prox-RCA stenosis followed by 100% prox to mid-RCA stenosis --> DESx2 to the RCA. Med management of residual disease.    Carpal tunnel syndrome    DDD (degenerative disc disease)    Diabetes mellitus    type 2   Hyperlipidemia    Hypertension    states very mild   Low testosterone    Myocardial infarction (HCC)    Overweight    Seasonal allergies    Tachycardia     Past Surgical History:  Procedure Laterality Date   CARDIAC CATHETERIZATION     CARPAL TUNNEL RELEASE  07/02/2011   Procedure: CARPAL TUNNEL RELEASE;  Surgeon: Wyn Forster., MD;  Location: [facility];  Service: Orthopedics;  Laterality: Right;   CARPAL TUNNEL RELEASE  07/30/2011   Procedure: CARPAL TUNNEL RELEASE;  Surgeon: Wyn Forster., MD;  Location: [facility];  Service: Orthopedics;  Laterality: Left;   CORONARY ANGIOPLASTY     CORONARY STENT INTERVENTION N/A 12/23/2018   Procedure: CORONARY STENT INTERVENTION;  Surgeon: Corky Crafts, MD;  Location: [facility] INVASIVE CV LAB;  Service: Cardiovascular;  Laterality: N/A;   LEFT HEART CATH AND  CORONARY ANGIOGRAPHY N/A 12/23/2018   Procedure: LEFT HEART CATH AND CORONARY ANGIOGRAPHY;  Surgeon: Corky Crafts, MD;  Location: [facility] INVASIVE CV LAB;  Service: Cardiovascular;  Laterality: N/A;   LUMBAR EPIDURAL INJECTION     x2   SHOULDER ARTHROSCOPY WITH OPEN ROTATOR CUFF REPAIR AND DISTAL CLAVICLE ACROMINECTOMY Right 06/03/2020   Procedure: right shoulder arthroscopy, biceps tenodesis, mini open rotator cuff tear repair subscap/supraspinatus, lateral debridement;  Surgeon: Cammy Copa, MD;  Location: [facility];  Service: Orthopedics;  Laterality: Right;    There were no vitals filed for this visit.   Subjective Assessment - 09/26/20 0939     Subjective Pt reports his r shoulder pian is some better. He is using the an ice pack 2-3x daily and taking lyrica and tylenol for the pain. Pt has an appt c Dr. August Saucer on M.onday    Pertinent History R/C repair (supraspinatus and subscap) with biceps tenodesis on 06/03/2020, heart attack 12/2020, DMT II    Patient Stated Goals get shoulder better    Currently in Pain? Yes    Pain Score 4     Pain Location Shoulder    Pain Orientation Right    Pain Descriptors / Indicators Aching;Tightness    Pain Type Chronic pain    Pain Onset More  than a month ago    Pain Frequency Intermittent                [facility] PT Assessment - 09/26/20 0001       AROM   AROM Assessment Site Shoulder    Right/Left Shoulder Right    Right Shoulder Flexion 85 Degrees   140d c finger wall climb; With active shoulder flexion pt starts to shrug; With active flexion pain starts at approx 50d.                          [facility] Adult PT Treatment/Exercise - 09/26/20 0001       Exercises   Exercises Shoulder      Shoulder Exercises: Supine   Protraction Both;10 reps   3 sets   Protraction Weight (lbs) 3    Horizontal ABduction Both;10 reps   3 sets   Horizontal ABduction Limitations red Tband      Shoulder Exercises: Standing   Extension  Both;10 reps   3 sets   Theraband Level (Shoulder Extension) Level 2 (Red)    Row Both;10 reps   3 sets   Theraband Level (Shoulder Row) Level 2 (Red)      Shoulder Exercises: ROM/Strengthening   UBE (Upper Arm Bike) 5' L; 2.5 min CW and CCW    Wall Wash 10x    Other ROM/Strengthening Exercises finger ladder 5x      Cryotherapy   Number Minutes Cryotherapy 10 Minutes    Cryotherapy Location Shoulder   R   Type of Cryotherapy Ice pack                      PT Short Term Goals - 09/26/20 2125       PT SHORT TERM GOAL #1   Title [name] will be >75% HEP compliant within 3 weeks to facilitate carryover between sessions and move towards eventual independent management of their condition.    Baseline Started on eval    Status On-going    Target Date 11/02/20               PT Long Term Goals - 08/27/20 1257       PT LONG TERM GOAL #1   Title [name] will achieve >=60 degrees of shoulder ER at 90 degrees to allow proper shoulder mechanics during OH movement.    Baseline 10 at neutral    Status On-going    Target Date 11/02/20      PT LONG TERM GOAL #2   Title [name] will demonstrate >130 degrees of active ROM, not limited by pain, in abduction and flexion to allow completion of activities involving reaching OH. 08/15/20: PROM= 145d, AAROM=130d, AROM=85d    Baseline 45 passive flexoin    Status On-going    Target Date 11/02/20      PT LONG TERM GOAL #3   Title Jakyron Fabro will be able to reach [facility] into cabinet, not limited by pain    Baseline unable    Status On-going    Target Date 11/02/20      PT LONG TERM GOAL #4   Title Ezzard Ditmer will be able to resume driving, not limited by pain    Status On-going    Target Date 11/02/20      PT LONG TERM GOAL #5   Title Jerel Sardina will be compliant with post op precautions throughout therapy.  Baseline non compliant    Status On-going    Target Date 11/02/20      PT LONG TERM GOAL #6    Title Markice Torbert will improve FOTO score from 30 to 66 as a proxy for functional improvement    Baseline 49%    Status On-going    Target Date 11/02/20                   Plan - 09/26/20 0934     Clinical Impression Statement Pt is reporting his L shoulder pain has decreased since the last session using a cold pack more frequently. Pt demonstrates 4/5 strength for R shoulder ER in a neutral position c min or no report of discomfort with the resistance. R shoulder AROM is still limited 85d with pt reporting R shoulder pain/strain starting 50d, at 85d pt begins to shrug his shoulder.  AAROM c a shoulder ladder is 140d. Pt is to return to see Dr. August Saucer on 8/15. Will continue R shoulder rehab unless directed otherwise by Dr. August Saucer.    Personal Factors and Comorbidities Fitness;Comorbidity 3+;Behavior Pattern    Comorbidities obesity, CAD, HTN, tachycardia, DM, HA    Examination-Activity Limitations Bathing;Carry;Dressing;Lift;Reach Overhead    Examination-Participation Restrictions Cleaning;Community Activity;Driving    Stability/Clinical Decision Making Stable/Uncomplicated    Clinical Decision Making Low    Rehab Potential Good    PT Frequency 2x / week    PT Duration 8 weeks    PT Treatment/Interventions ADLs/Self Care Home Management;Cryotherapy;Electrical Stimulation;Ultrasound;Moist Heat;Iontophoresis 4mg /ml Dexamethasone;Gait training;Stair training;Functional mobility training;Therapeutic activities;Therapeutic exercise;Balance training;Patient/family education;Manual techniques;Dry needling;Passive range of motion;Taping;Vasopneumatic Device;Canalith Repostioning    PT Next Visit Plan Assess R shoulder pain status. Progress strengthening for the periscapular and rotator cuff as tolerated.    PT Home Exercise Plan [email or web address].    Consulted and Agree with Plan of Care Patient             Patient will benefit from skilled therapeutic intervention in order to improve the following  deficits and impairments:  Decreased range of motion, Obesity, Decreased activity tolerance, Pain, Decreased strength, Decreased balance, Difficulty walking, Decreased knowledge of precautions, Impaired UE functional use  Visit Diagnosis: Right shoulder pain, unspecified chronicity  Muscle weakness  Decreased range of motion of left ankle     Problem List Patient Active Problem List   Diagnosis Date Noted   Fatigue due to excessive exertion 08/13/2020   Complete tear of right rotator cuff    Subluxation of tendon of long head of biceps    Surgery, elective 05/09/2020   Gastrocnemius strain, left 03/23/2020   Venous incompetence 12/28/2019   Exertional dyspnea 11/08/2019   Chronic right shoulder pain 09/02/2019   Diabetic neuropathy (HCC) 08/16/2019   Chest pain of uncertain etiology 04/27/2019   CAD -S/P PCI 01/02/2019   Obesity (BMI 30-39.9) 01/02/2019   Orthostatic dizziness 01/02/2019   Hyperlipidemia LDL goal <70 12/25/2018   History of non-ST elevation myocardial infarction (NSTEMI) 12/23/2018   Type 2 diabetes mellitus with complication (HCC)    Tachycardia    Hypertension     13/07/2018 MS, PT 09/26/20 9:42 PM   [facility] [address] [place], [place], [place] Phone: [phone]   Fax:  [phone]  Name: Petar Mucci MRN: [name] Date of Birth: [birth year]

## 2020-09-30 ENCOUNTER — Ambulatory Visit (INDEPENDENT_AMBULATORY_CARE_PROVIDER_SITE_OTHER): Payer: 59 | Admitting: Orthopedic Surgery

## 2020-09-30 ENCOUNTER — Other Ambulatory Visit: Payer: Self-pay

## 2020-09-30 DIAGNOSIS — M75121 Complete rotator cuff tear or rupture of right shoulder, not specified as traumatic: Secondary | ICD-10-CM

## 2020-10-01 ENCOUNTER — Ambulatory Visit: Payer: 59 | Admitting: Physical Therapy

## 2020-10-01 ENCOUNTER — Encounter: Payer: Self-pay | Admitting: Physical Therapy

## 2020-10-01 DIAGNOSIS — M25511 Pain in right shoulder: Secondary | ICD-10-CM

## 2020-10-01 DIAGNOSIS — M25672 Stiffness of left ankle, not elsewhere classified: Secondary | ICD-10-CM

## 2020-10-01 DIAGNOSIS — M6281 Muscle weakness (generalized): Secondary | ICD-10-CM

## 2020-10-01 NOTE — Therapy (Signed)
[facility] [address] [place], [place], [address] Phone: [phone]   Fax:  [phone]  Physical Therapy Treatment  Patient Details  Name: Keith Newton MRN: [id number] Date of Birth: [birth year] Referring Provider (PT): Cammy Copa, MD   Encounter Date: 10/01/2020   PT End of Session - 10/01/20 0917     Visit Number 18    Number of Visits 26    Date for PT Re-Evaluation 11/02/20    Authorization Type [employer]    Authorization - Number of Visits 30    PT Start Time 0915    PT Stop Time 1000    PT Time Calculation (min) 45 min    Activity Tolerance Patient tolerated treatment well;Patient limited by pain             Past Medical History:  Diagnosis Date   Arthritis    Back pain    CAD (coronary artery disease)    a. s/p NSTEMI in 12/2018 with 80% Prox-RCA stenosis followed by 100% prox to mid-RCA stenosis --> DESx2 to the RCA. Med management of residual disease.    Carpal tunnel syndrome    DDD (degenerative disc disease)    Diabetes mellitus    type 2   Hyperlipidemia    Hypertension    states very mild   Low testosterone    Myocardial infarction (HCC)    Overweight    Seasonal allergies    Tachycardia     Past Surgical History:  Procedure Laterality Date   CARDIAC CATHETERIZATION     CARPAL TUNNEL RELEASE  07/02/2011   Procedure: CARPAL TUNNEL RELEASE;  Surgeon: Wyn Forster., MD;  Location: [facility];  Service: Orthopedics;  Laterality: Right;   CARPAL TUNNEL RELEASE  07/30/2011   Procedure: CARPAL TUNNEL RELEASE;  Surgeon: Wyn Forster., MD;  Location: [facility];  Service: Orthopedics;  Laterality: Left;   CORONARY ANGIOPLASTY     CORONARY STENT INTERVENTION N/A 12/23/2018   Procedure: CORONARY STENT INTERVENTION;  Surgeon: Corky Crafts, MD;  Location: [facility] INVASIVE CV LAB;  Service: Cardiovascular;  Laterality: N/A;   LEFT HEART CATH AND  CORONARY ANGIOGRAPHY N/A 12/23/2018   Procedure: LEFT HEART CATH AND CORONARY ANGIOGRAPHY;  Surgeon: Corky Crafts, MD;  Location: [facility] INVASIVE CV LAB;  Service: Cardiovascular;  Laterality: N/A;   LUMBAR EPIDURAL INJECTION     x2   SHOULDER ARTHROSCOPY WITH OPEN ROTATOR CUFF REPAIR AND DISTAL CLAVICLE ACROMINECTOMY Right 06/03/2020   Procedure: right shoulder arthroscopy, biceps tenodesis, mini open rotator cuff tear repair subscap/supraspinatus, lateral debridement;  Surgeon: Cammy Copa, MD;  Location: [facility];  Service: Orthopedics;  Laterality: Right;    There were no vitals filed for this visit.   Subjective Assessment - 10/01/20 0923     Subjective Pt reports that he went to see his MD who told him everything looked ok, and that it was just a matter of time for the pain to reduce.  pt reports R shoulde pain at 2-3/10 today.  Aggs: reaching Eases: rest and ice.    Pertinent History R/C repair (supraspinatus and subscap) with biceps tenodesis on 06/03/2020, heart attack 12/2020, DMT II    Patient Stated Goals get shoulder better    Pain Onset More than a month ago             [facility] Adult PT Treatment/Exercise:  Therapeutic Exercise to improve strength of shoulder to reduce pain  and improve function.  Exercises selected to maximize therapeutic benefit while minimizing discomfort: - UBE - 2.5'/2.5' - while taking subjective - Row - 3x10 - RTB  - Shoulder Ext - 3x10 - GTB - Shoulder adduction - 3x10 YTB - with upward scapular rotation assist - Supine shoulder abduction - 3x10 RTB - S/L ER - 2# - 3x10 with assisted scapular retraction - Biceps curl - 3x10 @ 4# - IR with GTB - 3x10    PT Short Term Goals - 09/26/20 2125       PT SHORT TERM GOAL #1   Title [name] will be >75% HEP compliant within 3 weeks to facilitate carryover between sessions and move towards eventual independent management of their condition.    Baseline Started on eval    Status On-Newton     Target Date 11/02/20               PT Long Term Goals - 08/27/20 1257       PT LONG TERM GOAL #1   Title [name] will achieve >=60 degrees of shoulder ER at 90 degrees to allow proper shoulder mechanics during OH movement.    Baseline 10 at neutral    Status On-Newton    Target Date 11/02/20      PT LONG TERM GOAL #2   Title [name] will demonstrate >130 degrees of active ROM, not limited by pain, in abduction and flexion to allow completion of activities involving reaching OH. 08/15/20: PROM= 145d, AAROM=130d, AROM=85d    Baseline 45 passive flexoin    Status On-Newton    Target Date 11/02/20      PT LONG TERM GOAL #3   Title Brenda Cowher will be able to reach [facility] into cabinet, not limited by pain    Baseline unable    Status On-Newton    Target Date 11/02/20      PT LONG TERM GOAL #4   Title Quade Ramirez will be able to resume driving, not limited by pain    Status On-Newton    Target Date 11/02/20      PT LONG TERM GOAL #5   Title Kentrail Shew will be compliant with post op precautions throughout therapy.    Baseline non compliant    Status On-Newton    Target Date 11/02/20      PT LONG TERM GOAL #6   Title Zadkiel Dragan will improve FOTO score from 30 to 66 as a proxy for functional improvement    Baseline 49%    Status On-Newton    Target Date 11/02/20                   Plan - 10/01/20 0928     Clinical Impression Statement Pt reports a mild increase in pain following therapy  HEP was reviewed, but left unchanged    Overall, Hong Moring is progressing fair with therapy.  Today we concentrated on rotator cuff strengthening and periscapular strengthening.  Pt remains limited by pain but does show some improvement in function.  Will concentrate on biceps and sub scap for next several sessions.  Pt is moving slowly toward achieving goals.  Pt will continue to benefit from skilled physical therapy to address remaining deficits and achieve listed  goals.  Continue per POC.    Personal Factors and Comorbidities Fitness;Comorbidity 3+;Behavior Pattern    Comorbidities obesity, CAD, HTN, tachycardia, DM, HA    Examination-Activity Limitations Bathing;Carry;Dressing;Lift;Reach Overhead    Examination-Participation Restrictions Cleaning;Community  Activity;Driving    Stability/Clinical Decision Making Stable/Uncomplicated    Rehab Potential Good    PT Frequency 2x / week    PT Duration 8 weeks    PT Treatment/Interventions ADLs/Self Care Home Management;Cryotherapy;Electrical Stimulation;Ultrasound;Moist Heat;Iontophoresis 4mg /ml Dexamethasone;Gait training;Stair training;Functional mobility training;Therapeutic activities;Therapeutic exercise;Balance training;Patient/family education;Manual techniques;Dry needling;Passive range of motion;Taping;Vasopneumatic Device;Canalith Repostioning    PT Next Visit Plan Assess R shoulder pain status. Progress strengthening for the periscapular and rotator cuff as tolerated.    PT Home Exercise Plan [email or web address].    Consulted and Agree with Plan of Care Patient             Patient will benefit from skilled therapeutic intervention in order to improve the following deficits and impairments:  Decreased range of motion, Obesity, Decreased activity tolerance, Pain, Decreased strength, Decreased balance, Difficulty walking, Decreased knowledge of precautions, Impaired UE functional use  Visit Diagnosis: Right shoulder pain, unspecified chronicity  Muscle weakness  Decreased range of motion of left ankle  Muscle weakness (generalized)     Problem List Patient Active Problem List   Diagnosis Date Noted   Fatigue due to excessive exertion 08/13/2020   Complete tear of right rotator cuff    Subluxation of tendon of long head of biceps    Surgery, elective 05/09/2020   Gastrocnemius strain, left 03/23/2020   Venous incompetence 12/28/2019   Exertional dyspnea 11/08/2019   Chronic right shoulder  pain 09/02/2019   Diabetic neuropathy (HCC) 08/16/2019   Chest pain of uncertain etiology 04/27/2019   CAD -S/P PCI 01/02/2019   Obesity (BMI 30-39.9) 01/02/2019   Orthostatic dizziness 01/02/2019   Hyperlipidemia LDL goal <70 12/25/2018   History of non-ST elevation myocardial infarction (NSTEMI) 12/23/2018   Type 2 diabetes mellitus with complication (HCC)    Tachycardia    Hypertension    13/07/2018 PT, DPT 10/01/20 10:04 AM  [facility] [address] [place], [place], [place] Phone: [phone]   Fax:  [phone]  Name: Kirklin Mcduffee MRN: [name] Date of Birth: 05-04-1955

## 2020-10-03 ENCOUNTER — Encounter: Payer: Self-pay | Admitting: Physical Therapy

## 2020-10-03 ENCOUNTER — Other Ambulatory Visit: Payer: Self-pay

## 2020-10-03 ENCOUNTER — Ambulatory Visit: Payer: 59 | Admitting: Physical Therapy

## 2020-10-03 DIAGNOSIS — M6281 Muscle weakness (generalized): Secondary | ICD-10-CM

## 2020-10-03 DIAGNOSIS — M25672 Stiffness of left ankle, not elsewhere classified: Secondary | ICD-10-CM

## 2020-10-03 DIAGNOSIS — M25511 Pain in right shoulder: Secondary | ICD-10-CM

## 2020-10-03 DIAGNOSIS — R293 Abnormal posture: Secondary | ICD-10-CM

## 2020-10-03 NOTE — Patient Instructions (Signed)
Access Code: [email or web address] URL: [email or web address] Date: 10/03/2020 Prepared by: Alphonzo Severance  Exercises Shoulder Internal Rotation with Resistance - 1 x daily - 5 x weekly - 3 sets - 10 reps Shoulder Flexion Wall Slide with Towel - 1 x daily - 7 x weekly - 3 sets - 10 reps Standing Single Arm Elbow Flexion with Resistance - 1 x daily - 7 x weekly - 3 sets - 10 reps

## 2020-10-03 NOTE — Therapy (Signed)
Keith Newton, Keith Newton, [address] Phone: [phone]   Fax:  [phone]  Physical Therapy Treatment  Patient Details  Name: Keith Newton MRN: [id number] Date of Birth: 03-09-1955 Referring Provider (PT): Meredith Pel, MD   Encounter Date: 10/03/2020   PT End of Session - 10/03/20 0918     Visit Number 19    Number of Visits 26    Date for PT Re-Evaluation 11/02/20    Authorization Type [employer]    Authorization - Number of Visits 30    PT Start Time 0915    PT Stop Time 1005    PT Time Calculation (min) 50 min    Activity Tolerance Patient tolerated treatment well;Patient limited by pain             Past Medical History:  Diagnosis Date   Arthritis    Back pain    CAD (coronary artery disease)    a. s/p NSTEMI in 12/2018 with 80% Prox-RCA stenosis followed by 100% prox to mid-RCA stenosis --> DESx2 to the RCA. Med management of residual disease.    Carpal tunnel syndrome    DDD (degenerative disc disease)    Diabetes mellitus    type 2   Hyperlipidemia    Hypertension    states very mild   Low testosterone    Myocardial infarction ([place])    Overweight    Seasonal allergies    Tachycardia     Past Surgical History:  Procedure Laterality Date   CARDIAC CATHETERIZATION     CARPAL TUNNEL RELEASE  07/02/2011   Procedure: CARPAL TUNNEL RELEASE;  Surgeon: Cammie Sickle., MD;  Location: [place];  Service: Orthopedics;  Laterality: Right;   CARPAL TUNNEL RELEASE  07/30/2011   Procedure: CARPAL TUNNEL RELEASE;  Surgeon: Cammie Sickle., MD;  Location: [place];  Service: Orthopedics;  Laterality: Left;   CORONARY ANGIOPLASTY     CORONARY STENT INTERVENTION N/A 12/23/2018   Procedure: CORONARY STENT INTERVENTION;  Surgeon: Jettie Booze, MD;  Location: [facility];  Service: Cardiovascular;  Laterality: N/A;   LEFT HEART CATH AND  CORONARY ANGIOGRAPHY N/A 12/23/2018   Procedure: LEFT HEART CATH AND CORONARY ANGIOGRAPHY;  Surgeon: Jettie Booze, MD;  Location: [place] CV LAB;  Service: Cardiovascular;  Laterality: N/A;   LUMBAR EPIDURAL INJECTION     x2   SHOULDER ARTHROSCOPY WITH OPEN ROTATOR CUFF REPAIR AND DISTAL CLAVICLE ACROMINECTOMY Right 06/03/2020   Procedure: right shoulder arthroscopy, biceps tenodesis, mini open rotator cuff tear repair subscap/supraspinatus, lateral debridement;  Surgeon: Meredith Pel, MD;  Location: [place];  Service: Orthopedics;  Laterality: Right;    There were no vitals filed for this visit.   Subjective Assessment - 10/03/20 0922     Subjective Pt reports that his shoulder was more achy last night.  He does not know why.  His R shoulder pain is 4/5 currenlty but reached a 7-8/10 last night.  Aggs: reaching Eases: rest and ice.    Pertinent History R/C repair (supraspinatus and subscap) with biceps tenodesis on 06/03/2020, heart attack 12/2020, DMT II    Patient Stated Goals get shoulder better    Pain Onset More than a month ago                [facility] PT Assessment - 10/03/20 0001       AROM   Right Shoulder External Rotation --  WNL             [facility] Adult PT Treatment/Exercise:   Therapeutic Exercise to improve strength of shoulder to reduce pain and improve function.  Exercises selected to maximize therapeutic benefit while minimizing discomfort: - UBE - 2.5'/2.5' - while taking subjective - Row - 3x10 - GTB  - Shoulder Ext - 3x10 - GTB - Shoulder adduction - 3x15 YTB - with upward scapular rotation assist - S/L ER - 2# - 3x10 with assisted scapular retraction - Biceps curl - 3x10 @ 5# - P-ball press to flexion- 2x10 - IR with YTB - 3x10  Modalities:  Vasopneumatic (Game Ready)    Location:  right shoulder Time:  10 minutes Pressure:  medium Temperature:  36 degrees    PT Short Term Goals - 09/26/20 2125       PT SHORT TERM GOAL #1    Title Keith Newton will be >75% HEP compliant within 3 weeks to facilitate carryover between sessions and move towards eventual independent management of their condition.    Baseline Started on eval    Status On-going    Target Date 11/02/20               PT Long Term Goals - 10/03/20 0954       PT LONG TERM GOAL #1   Title Keith Newton will achieve >=60 degrees of shoulder ER at 90 degrees to allow proper shoulder mechanics during OH movement.    Baseline 10 at neutral 8/18: met    Status Achieved    Target Date 11/02/20      PT LONG TERM GOAL #2   Title Keith Newton will demonstrate >130 degrees of active ROM, not limited by pain, in abduction and flexion to allow completion of activities involving reaching [place]. 08/15/20: PROM= 145d, AAROM=130d, AROM=85d    Baseline 45 passive flexoin    Status On-going    Target Date 11/02/20      PT LONG TERM GOAL #3   Title Keith Newton will be able to reach [facility] into cabinet, not limited by pain    Baseline unable    Status On-going    Target Date 11/02/20      PT LONG TERM GOAL #4   Title Keith Newton will be able to resume driving, not limited by pain    Status On-going    Target Date 11/02/20      PT LONG TERM GOAL #5   Title Keith Newton will be compliant with post op precautions throughout therapy.    Baseline non compliant    Status On-going    Target Date 11/02/20      PT LONG TERM GOAL #6   Title Keith Newton will improve FOTO score from 30 to 66 as a proxy for functional improvement    Baseline 49%    Status On-going    Target Date 11/02/20                   Plan - 10/03/20 0942     Clinical Impression Statement Pt reports no increase in baseline pain following therapy  HEP was updated and reissued to patient    Overall, Keith Newton is progressing fair with therapy.  Today we concentrated on rotator cuff strengthening and periscapular strengthening.  Pt continues to be limited by pain, but was able to  tolerate biceps curl and IR with less pain today.  We will continue to progress these as able.  Pt will  continue to benefit from skilled physical therapy to address remaining deficits and achieve listed goals.  Continue per POC.    Personal Factors and Comorbidities Fitness;Comorbidity 3+;Behavior Pattern    Comorbidities obesity, CAD, HTN, tachycardia, DM, HA    Examination-Activity Limitations Bathing;Carry;Dressing;Lift;Reach Overhead    Examination-Participation Restrictions Cleaning;Community Activity;Driving    Stability/Clinical Decision Making Stable/Uncomplicated    Rehab Potential Good    PT Frequency 2x / week    PT Duration 8 weeks    PT Treatment/Interventions ADLs/Self Care Home Management;Cryotherapy;Electrical Stimulation;Ultrasound;Moist Heat;Iontophoresis 29m/ml Dexamethasone;Gait training;Stair training;Functional mobility training;Therapeutic activities;Therapeutic exercise;Balance training;Patient/family education;Manual techniques;Dry needling;Passive range of motion;Taping;Vasopneumatic Device;Canalith Repostioning    PT Next Visit Plan Assess R shoulder pain status. Progress strengthening for the periscapular and rotator cuff as tolerated.    PT Home Exercise Plan [email or web address].    Consulted and Agree with Plan of Care Patient             Patient will benefit from skilled therapeutic intervention in order to improve the following deficits and impairments:  Decreased range of motion, Obesity, Decreased activity tolerance, Pain, Decreased strength, Decreased balance, Difficulty walking, Decreased knowledge of precautions, Impaired UE functional use  Visit Diagnosis: Right shoulder pain, unspecified chronicity  Muscle weakness  Decreased range of motion of left ankle  Muscle weakness (generalized)  Abnormal posture     Problem List Patient Active Problem List   Diagnosis Date Noted   Fatigue due to excessive exertion 08/13/2020   Complete tear of right rotator  cuff    Subluxation of tendon of long head of biceps    Surgery, elective 05/09/2020   Gastrocnemius strain, left 03/23/2020   Venous incompetence 12/28/2019   Exertional dyspnea 11/08/2019   Chronic right shoulder pain 09/02/2019   Diabetic neuropathy ([place] 08/16/2019   Chest pain of uncertain etiology 019/59/7471  CAD -S/P PCI 01/02/2019   Obesity (BMI 30-39.9) 01/02/2019   Orthostatic dizziness 01/02/2019   Hyperlipidemia LDL goal <70 12/25/2018   History of non-ST elevation myocardial infarction (NSTEMI) 12/23/2018   Type 2 diabetes mellitus with complication (HCC)    Tachycardia    Hypertension     KShearon BaloPT, Keith Newton 10/03/20 9:56 AM  [facility]GTet[address] [place] [address]Phone: [phone]  Fax:  [phone] Name: GShaheer BonfieldMRN: [id number] of Birth: 1August 11, 1957

## 2020-10-04 ENCOUNTER — Encounter: Payer: Self-pay | Admitting: Orthopedic Surgery

## 2020-10-04 NOTE — Progress Notes (Signed)
Post-Op Visit Note   Patient: Keith Newton           Date of Birth: 1955-07-26           MRN: [id number] Visit Date: 09/30/2020 PCP: Keith Deeds, MD   Assessment & Plan:  Chief Complaint:  Chief Complaint  Patient presents with   Right Shoulder - Follow-up    06/03/20 right shoulder scope with RCR   Visit Diagnoses:  1. Complete tear of right rotator cuff, unspecified whether traumatic     Plan: Keith Newton is now about 4 months out right shoulder arthroscopy with rotator cuff repair.  He states that he is "getting there".  He is in physical therapy.  Some days he has no pain.  This is an improvement compared to his prior clinic visits.  On exam he has passive range of motion of 45/85/150.  Rotator cuff strength is improving.  Overall Keith Newton is making slow but steady progress.  No coarse grinding or crepitus with active or passive range of motion of that right shoulder.  Plan at this time is continue with home exercise program and strengthening in 4 months return with Keith Newton for final check.  Follow-Up Instructions: Return in about 4 months (around 01/30/2021).   Orders:  No orders of the defined types were placed in this encounter.  No orders of the defined types were placed in this encounter.   Imaging: No results found.  PMFS History: Patient Active Problem List   Diagnosis Date Noted   Fatigue due to excessive exertion 08/13/2020   Complete tear of right rotator cuff    Subluxation of tendon of long head of biceps    Surgery, elective 05/09/2020   Gastrocnemius strain, left 03/23/2020   Venous incompetence 12/28/2019   Exertional dyspnea 11/08/2019   Chronic right shoulder pain 09/02/2019   Diabetic neuropathy (HCC) 08/16/2019   Chest pain of uncertain etiology 04/27/2019   CAD -S/P PCI 01/02/2019   Obesity (BMI 30-39.9) 01/02/2019   Orthostatic dizziness 01/02/2019   Hyperlipidemia LDL goal <70 12/25/2018   History of non-ST elevation myocardial infarction (NSTEMI)  12/23/2018   Type 2 diabetes mellitus with complication (HCC)    Tachycardia    Hypertension    Past Medical History:  Diagnosis Date   Arthritis    Back pain    CAD (coronary artery disease)    a. s/p NSTEMI in 12/2018 with 80% Prox-RCA stenosis followed by 100% prox to mid-RCA stenosis --> DESx2 to the RCA. Med management of residual disease.    Carpal tunnel syndrome    DDD (degenerative disc disease)    Diabetes mellitus    type 2   Hyperlipidemia    Hypertension    states very mild   Low testosterone    Myocardial infarction (HCC)    Overweight    Seasonal allergies    Tachycardia     Family History  Problem Relation Age of Onset   Stroke Mother    CAD Father    CAD Maternal Grandfather    CAD Paternal Grandfather     Past Surgical History:  Procedure Laterality Date   CARDIAC CATHETERIZATION     CARPAL TUNNEL RELEASE  07/02/2011   Procedure: CARPAL TUNNEL RELEASE;  Surgeon: Wyn Forster., MD;  Location: [facility];  Service: Orthopedics;  Laterality: Right;   CARPAL TUNNEL RELEASE  07/30/2011   Procedure: CARPAL TUNNEL RELEASE;  Surgeon: Wyn Forster., MD;  Location: [facility];  Service: Orthopedics;  Laterality: Left;   CORONARY ANGIOPLASTY     CORONARY STENT INTERVENTION N/A 12/23/2018   Procedure: CORONARY STENT INTERVENTION;  Surgeon: Corky Crafts, MD;  Location: [facility] INVASIVE CV LAB;  Service: Cardiovascular;  Laterality: N/A;   LEFT HEART CATH AND CORONARY ANGIOGRAPHY N/A 12/23/2018   Procedure: LEFT HEART CATH AND CORONARY ANGIOGRAPHY;  Surgeon: Corky Crafts, MD;  Location: [facility] INVASIVE CV LAB;  Service: Cardiovascular;  Laterality: N/A;   LUMBAR EPIDURAL INJECTION     x2   SHOULDER ARTHROSCOPY WITH OPEN ROTATOR CUFF REPAIR AND DISTAL CLAVICLE ACROMINECTOMY Right 06/03/2020   Procedure: right shoulder arthroscopy, biceps tenodesis, mini open rotator cuff tear repair subscap/supraspinatus, lateral  debridement;  Surgeon: Cammy Copa, MD;  Location: [facility];  Service: Orthopedics;  Laterality: Right;   Social History   Occupational History   Occupation: retired  Tobacco Use   Smoking status: Never   Smokeless tobacco: Never  Vaping Use   Vaping Use: Never used  Substance and Sexual Activity   Alcohol use: No   Drug use: No   Sexual activity: Not on file

## 2020-10-07 ENCOUNTER — Other Ambulatory Visit: Payer: Self-pay

## 2020-10-07 MED ORDER — PREGABALIN 75 MG PO CAPS: 75.0000 mg | ORAL_CAPSULE | Freq: Three times a day (TID) | ORAL | 1 refills | Status: DC

## 2020-10-08 ENCOUNTER — Ambulatory Visit: Payer: 59 | Admitting: Physical Therapy

## 2020-10-08 ENCOUNTER — Other Ambulatory Visit: Payer: Self-pay

## 2020-10-08 ENCOUNTER — Encounter: Payer: Self-pay | Admitting: Physical Therapy

## 2020-10-08 DIAGNOSIS — M25511 Pain in right shoulder: Secondary | ICD-10-CM | POA: Diagnosis not present

## 2020-10-08 DIAGNOSIS — M6281 Muscle weakness (generalized): Secondary | ICD-10-CM

## 2020-10-08 DIAGNOSIS — M25672 Stiffness of left ankle, not elsewhere classified: Secondary | ICD-10-CM

## 2020-10-08 DIAGNOSIS — R293 Abnormal posture: Secondary | ICD-10-CM

## 2020-10-08 NOTE — Therapy (Signed)
[place], [place], [address] Phone: [phone]   Fax:  [phone]  Physical Therapy Treatment  Patient Details  Name: Keith Newton MRN: [id number] Date of Birth: Apr 07, 1955 Referring Provider (PT): Meredith Pel, MD   Encounter Date: 10/08/2020   PT End of Session - 10/08/20 0923     Visit Number 20    Number of Visits 26    Date for PT Re-Evaluation 11/02/20    Authorization Type [employer]    Authorization - Number of Visits 64    PT Start Time [phone]   pt arrived late   PT Stop Time 1010   vaso   PT Time Calculation (min) 43 min    Activity Tolerance Patient tolerated treatment well;Patient limited by pain             Past Medical History:  Diagnosis Date   Arthritis    Back pain    CAD (coronary artery disease)    a. s/p NSTEMI in 12/2018 with 80% Prox-RCA stenosis followed by 100% prox to mid-RCA stenosis --> DESx2 to the RCA. Med management of residual disease.    Carpal tunnel syndrome    DDD (degenerative disc disease)    Diabetes mellitus    type 2   Hyperlipidemia    Hypertension    states very mild   Low testosterone    Myocardial infarction ([place])    Overweight    Seasonal allergies    Tachycardia     Past Surgical History:  Procedure Laterality Date   CARDIAC CATHETERIZATION     CARPAL TUNNEL RELEASE  07/02/2011   Procedure: CARPAL TUNNEL RELEASE;  Surgeon: Cammie Sickle., MD;  Location: [facility];  Service: Orthopedics;  Laterality: Right;   CARPAL TUNNEL RELEASE  07/30/2011   Procedure: CARPAL TUNNEL RELEASE;  Surgeon: Cammie Sickle., MD;  Location: [facility];  Service: Orthopedics;  Laterality: Left;   CORONARY ANGIOPLASTY     CORONARY STENT INTERVENTION N/A 12/23/2018   Procedure: CORONARY STENT INTERVENTION;  Surgeon: Jettie Booze, MD;  Location: [facility];  Service: Cardiovascular;  Laterality: N/A;    LEFT HEART CATH AND CORONARY ANGIOGRAPHY N/A 12/23/2018   Procedure: LEFT HEART CATH AND CORONARY ANGIOGRAPHY;  Surgeon: Jettie Booze, MD;  Location: [facility];  Service: Cardiovascular;  Laterality: N/A;   LUMBAR EPIDURAL INJECTION     x2   SHOULDER ARTHROSCOPY WITH OPEN ROTATOR CUFF REPAIR AND DISTAL CLAVICLE ACROMINECTOMY Right 06/03/2020   Procedure: right shoulder arthroscopy, biceps tenodesis, mini open rotator cuff tear repair subscap/supraspinatus, lateral debridement;  Surgeon: Meredith Pel, MD;  Location: [facility];  Service: Orthopedics;  Laterality: Right;    There were no vitals filed for this visit.   Subjective Assessment - 10/08/20 0928     Subjective Pt reports that his shoulder is feeling somewhat better.  He liked the vaso last session.  His R shoulder pain is 3/10 currenlty.  Aggs: reaching Eases: rest and ice.    Pertinent History R/C repair (supraspinatus and subscap) with biceps tenodesis on 06/03/2020, heart attack 12/2020, DMT II    Patient Stated Goals get shoulder better    Pain Onset More than a month ago                [facility] PT Assessment - 10/08/20 0001       AROM   Right Shoulder Flexion 120 Degrees  AAROM           Therapeutic Exercise to improve strength of shoulder to reduce pain and improve function.  Exercises selected to maximize therapeutic benefit while minimizing discomfort: - UBE - 2.5'/2.5' - while taking subjective - Row - 3x10 - Blue TB  - Shoulder Ext - 3x10 - Blue TB - Shoulder adduction - 3x15 R TB - with upward scapular rotation assist - S/L ER - 3# - 3x15 with assisted scapular retraction - Biceps curl - 3x10 @ 5# - P-ball press to flexion- 2x10 - IR with RTB - 3x10   Modalities:   Vasopneumatic (Game Ready)     Location:  right shoulder Time:  10 minutes Pressure:  medium Temperature:  38 degrees   PT Short Term Goals - 09/26/20 2125       PT SHORT TERM GOAL #1   Title Keith Newton will be  >75% HEP compliant within 3 weeks to facilitate carryover between sessions and move towards eventual independent management of their condition.    Baseline Started on eval    Status On-going    Target Date 11/02/20               PT Long Term Goals - 10/08/20 0957       PT LONG TERM GOAL #1   Title Keith Newton will achieve >=60 degrees of shoulder ER at 90 degrees to allow proper shoulder mechanics during OH movement.    Baseline 10 at neutral 8/18: met    Status Achieved    Target Date 11/02/20      PT LONG TERM GOAL #2   Title Keith Newton will demonstrate >130 degrees of active ROM, not limited by pain, in abduction and flexion to allow completion of activities involving reaching OH. 08/15/20: PROM= 145d, AAROM=130d, AROM=85d    Baseline 45 passive flexoin 8/23: 120 degrees AAROM    Status Partially Met    Target Date 11/02/20      PT LONG TERM GOAL #3   Title Keith Newton will be able to reach [facility] into cabinet, not limited by pain    Baseline unable  8/23: Able to first shelf, but painful    Status On-going    Target Date 11/02/20      PT LONG TERM GOAL #4   Title Keith Newton will be able to resume driving, not limited by pain    Baseline 8/23: MET    Status On-going    Target Date 11/02/20      PT LONG TERM GOAL #5   Title Keith Newton will be compliant with post op precautions throughout therapy.    Baseline non compliant    Status On-going    Target Date 11/02/20      PT LONG TERM GOAL #6   Title Keith Newton will improve FOTO score from 30 to 66 as a proxy for functional improvement    Baseline 49%    Status On-going    Target Date 11/02/20                   Plan - 10/08/20 0936     Clinical Impression Statement Pt seems to be progressing well.  He is having lower baseline pain and increased function at home.  Tolerating increased resistance with band exercises.  Will continue to focus on biceps and subscap.  Continue per POC    Personal Factors  and Comorbidities Fitness;Comorbidity 3+;Behavior Pattern    Comorbidities obesity, CAD, HTN, tachycardia,  DM, HA    Examination-Activity Limitations Bathing;Carry;Dressing;Lift;Reach Overhead    Examination-Participation Restrictions Cleaning;Community Activity;Driving    Stability/Clinical Decision Making Stable/Uncomplicated    Rehab Potential Good    PT Frequency 2x / week    PT Duration 8 weeks    PT Treatment/Interventions ADLs/Self Care Home Management;Cryotherapy;Electrical Stimulation;Ultrasound;Moist Heat;Iontophoresis 93m/ml Dexamethasone;Gait training;Stair training;Functional mobility training;Therapeutic activities;Therapeutic exercise;Balance training;Patient/family education;Manual techniques;Dry needling;Passive range of motion;Taping;Vasopneumatic Device;Canalith Repostioning    PT Next Visit Plan Assess R shoulder pain status. Progress strengthening for the periscapular and rotator cuff as tolerated.    PT Home Exercise Plan [email or web address].    Consulted and Agree with Plan of Care Patient             Patient will benefit from skilled therapeutic intervention in order to improve the following deficits and impairments:  Decreased range of motion, Obesity, Decreased activity tolerance, Pain, Decreased strength, Decreased balance, Difficulty walking, Decreased knowledge of precautions, Impaired UE functional use  Visit Diagnosis: Right shoulder pain, unspecified chronicity  Muscle weakness  Decreased range of motion of left ankle  Muscle weakness (generalized)  Abnormal posture     Problem List Patient Active Problem List   Diagnosis Date Noted   Fatigue due to excessive exertion 08/13/2020   Complete tear of right rotator cuff    Subluxation of tendon of long head of biceps    Surgery, elective 05/09/2020   Gastrocnemius strain, left 03/23/2020   Venous incompetence 12/28/2019   Exertional dyspnea 11/08/2019   Chronic right shoulder pain 09/02/2019   Diabetic  neuropathy ([place] 08/16/2019   Chest pain of uncertain etiology 090/90/3014  CAD -S/P PCI 01/02/2019   Obesity (BMI 30-39.9) 01/02/2019   Orthostatic dizziness 01/02/2019   Hyperlipidemia LDL goal <70 12/25/2018   History of non-ST elevation myocardial infarction (NSTEMI) 12/23/2018   Type 2 diabetes mellitus with complication (HCC)    Tachycardia    Hypertension     KShearon BaloPT, DPT 10/08/20 9:59 AM  [facility] [address]Phone: [phone]  Fax:  [phone] Name: GLesean WoolvertonMRN: [id number] of Birth: 1Jun 13, 1957

## 2020-10-10 ENCOUNTER — Encounter: Payer: Self-pay | Admitting: Physical Therapy

## 2020-10-10 ENCOUNTER — Ambulatory Visit: Payer: 59 | Admitting: Physical Therapy

## 2020-10-10 ENCOUNTER — Other Ambulatory Visit: Payer: Self-pay

## 2020-10-10 DIAGNOSIS — M25672 Stiffness of left ankle, not elsewhere classified: Secondary | ICD-10-CM

## 2020-10-10 DIAGNOSIS — M25511 Pain in right shoulder: Secondary | ICD-10-CM

## 2020-10-10 DIAGNOSIS — M6281 Muscle weakness (generalized): Secondary | ICD-10-CM

## 2020-10-10 NOTE — Therapy (Signed)
[place], [place], [address] Phone: [phone]   Fax:  [phone]  Physical Therapy Treatment  Patient Details  Name: Keith Newton MRN: [id number] Date of Birth: 1955/09/05 Referring Provider (PT): Marlou Sa Tonna Corner, MD   Encounter Date: 10/10/2020   PT End of Session - 10/10/20 0912     Visit Number 21    Number of Visits 26    Date for PT Re-Evaluation 11/02/20    Authorization Type [employer]    Authorization - Number of Visits 30    PT Start Time 0915    PT Stop Time 0958    PT Time Calculation (min) 43 min    Activity Tolerance Patient tolerated treatment well;Patient limited by pain             Past Medical History:  Diagnosis Date   Arthritis    Back pain    CAD (coronary artery disease)    a. s/p NSTEMI in 12/2018 with 80% Prox-RCA stenosis followed by 100% prox to mid-RCA stenosis --> DESx2 to the RCA. Med management of residual disease.    Carpal tunnel syndrome    DDD (degenerative disc disease)    Diabetes mellitus    type 2   Hyperlipidemia    Hypertension    states very mild   Low testosterone    Myocardial infarction ([place])    Overweight    Seasonal allergies    Tachycardia     Past Surgical History:  Procedure Laterality Date   CARDIAC CATHETERIZATION     CARPAL TUNNEL RELEASE  07/02/2011   Procedure: CARPAL TUNNEL RELEASE;  Surgeon: Cammie Sickle., MD;  Location: [facility];  Service: Orthopedics;  Laterality: Right;   CARPAL TUNNEL RELEASE  07/30/2011   Procedure: CARPAL TUNNEL RELEASE;  Surgeon: Cammie Sickle., MD;  Location: [facility];  Service: Orthopedics;  Laterality: Left;   CORONARY ANGIOPLASTY     CORONARY STENT INTERVENTION N/A 12/23/2018   Procedure: CORONARY STENT INTERVENTION;  Surgeon: Jettie Booze, MD;  Location: [facility];  Service: Cardiovascular;  Laterality: N/A;   LEFT HEART CATH AND  CORONARY ANGIOGRAPHY N/A 12/23/2018   Procedure: LEFT HEART CATH AND CORONARY ANGIOGRAPHY;  Surgeon: Jettie Booze, MD;  Location: [facility];  Service: Cardiovascular;  Laterality: N/A;   LUMBAR EPIDURAL INJECTION     x2   SHOULDER ARTHROSCOPY WITH OPEN ROTATOR CUFF REPAIR AND DISTAL CLAVICLE ACROMINECTOMY Right 06/03/2020   Procedure: right shoulder arthroscopy, biceps tenodesis, mini open rotator cuff tear repair subscap/supraspinatus, lateral debridement;  Surgeon: Meredith Pel, MD;  Location: [facility];  Service: Orthopedics;  Laterality: Right;    There were no vitals filed for this visit.   Subjective Assessment - 10/10/20 0919     Subjective Pt reports that his shoulder continues to improve.  He cut a root out of the ground with an electric saw.  He was more limited by fatigue than pain.  He liked the vaso last session.  His R shoulder pain is 3/10 currenlty.  Aggs: reaching Eases: rest and ice.    Pertinent History R/C repair (supraspinatus and subscap) with biceps tenodesis on 06/03/2020, heart attack 12/2020, DMT II    Patient Stated Goals get shoulder better    Pain Onset More than a month ago            FOTO: 46  Therapeutic Exercise to improve strength of  shoulder to reduce pain and improve function.  Exercises selected to maximize therapeutic benefit while minimizing discomfort: - UBE - 2.5'/2.5' - L2 -  while taking subjective - Row - 3x10 - Black TB  - Shoulder Ext - 3x10 - Blue TB - Shoulder adduction - 3x15 R TB - with upward scapular rotation assist - S/L ER - 3# - 3x15 with assisted scapular retraction - Biceps curl - 3x10 @ 7# - wall slide with lift off - IR with GTB - 3x10 (hold) - Tricep push down - 3x10 - black TB   Therapeutic Activity: - Taking and reviewing FOTO   Modalities:   Vasopneumatic (Game Ready)     Location:  right shoulder Time:  10 minutes Pressure:  medium Temperature:  38 degrees    PT Short Term Goals -  09/26/20 2125       PT SHORT TERM GOAL #1   Title [name] will be >75% HEP compliant within 3 weeks to facilitate carryover between sessions and move towards eventual independent management of their condition.    Baseline Started on eval    Status On-going    Target Date 11/02/20               PT Long Term Goals - 10/10/20 1002       PT LONG TERM GOAL #1   Title [name] will achieve >=60 degrees of shoulder ER at 90 degrees to allow proper shoulder mechanics during OH movement.    Baseline 10 at neutral 8/18: met    Status Achieved      PT LONG TERM GOAL #2   Title Berish Bohman will demonstrate >130 degrees of active ROM, not limited by pain, in abduction and flexion to allow completion of activities involving reaching OH. 08/15/20: PROM= 145d, AAROM=130d, AROM=85d    Baseline 45 passive flexoin 8/23: 120 degrees AAROM    Status Partially Met      PT LONG TERM GOAL #3   Title Karim Aiello will be able to reach [facility] into cabinet, not limited by pain    Baseline unable  8/23: Able to first shelf, but painful    Status On-going      PT LONG TERM GOAL #4   Title Nayan Proch will be able to resume driving, not limited by pain    Baseline 8/23: MET    Status On-going      PT LONG TERM GOAL #5   Title Jaceon Heiberger will be compliant with post op precautions throughout therapy.    Baseline non compliant    Status On-going      PT LONG TERM GOAL #6   Title Doyce Saling will improve FOTO score from 30 to 66 as a proxy for functional improvement    Baseline 49%    Status On-going                   Plan - 10/10/20 0926     Clinical Impression Statement Jeremaih Klima is progressing well.  We are increasing intensity of band exercises and concentrating on strength below shoulder height.  We began integrating some lifting above shoulder height today, but this is still challenging.  ER and OH movements increase pain, but it reduces to 0/10 upon leaving clinic.   Continue per POC.    Personal Factors and Comorbidities Fitness;Comorbidity 3+;Behavior Pattern    Comorbidities obesity, CAD, HTN, tachycardia, DM, HA    Examination-Activity Limitations Bathing;Carry;Dressing;Lift;Reach Overhead    Examination-Participation Restrictions  Cleaning;Community Activity;Driving    Stability/Clinical Decision Making Stable/Uncomplicated    Rehab Potential Good    PT Frequency 2x / week    PT Duration 8 weeks    PT Treatment/Interventions ADLs/Self Care Home Management;Cryotherapy;Electrical Stimulation;Ultrasound;Moist Heat;Iontophoresis 29m/ml Dexamethasone;Gait training;Stair training;Functional mobility training;Therapeutic activities;Therapeutic exercise;Balance training;Patient/family education;Manual techniques;Dry needling;Passive range of motion;Taping;Vasopneumatic Device;Canalith Repostioning    PT Next Visit Plan Assess R shoulder pain status. Progress strengthening for the periscapular and rotator cuff as tolerated.    PT Home Exercise Plan [email or web address].    Consulted and Agree with Plan of Care Patient             Patient will benefit from skilled therapeutic intervention in order to improve the following deficits and impairments:  Decreased range of motion, Obesity, Decreased activity tolerance, Pain, Decreased strength, Decreased balance, Difficulty walking, Decreased knowledge of precautions, Impaired UE functional use  Visit Diagnosis: Right shoulder pain, unspecified chronicity  Muscle weakness  Decreased range of motion of left ankle     Problem List Patient Active Problem List   Diagnosis Date Noted   Fatigue due to excessive exertion 08/13/2020   Complete tear of right rotator cuff    Subluxation of tendon of long head of biceps    Surgery, elective 05/09/2020   Gastrocnemius strain, left 03/23/2020   Venous incompetence 12/28/2019   Exertional dyspnea 11/08/2019   Chronic right shoulder pain 09/02/2019   Diabetic neuropathy  ([place] 08/16/2019   Chest pain of uncertain etiology 070/92/9574  CAD -S/P PCI 01/02/2019   Obesity (BMI 30-39.9) 01/02/2019   Orthostatic dizziness 01/02/2019   Hyperlipidemia LDL goal <70 12/25/2018   History of non-ST elevation myocardial infarction (NSTEMI) 12/23/2018   Type 2 diabetes mellitus with complication (HCC)    Tachycardia    Hypertension     KShearon BaloPT, DPT 10/10/20 10:03 AM s [place] [address]Phone: [phone]  Fax:  [phone] Name: GKhary SchabenMRN: [id number] of Birth: [birth year]

## 2020-10-15 ENCOUNTER — Ambulatory Visit: Payer: 59 | Admitting: Physical Therapy

## 2020-10-15 ENCOUNTER — Telehealth: Payer: Self-pay | Admitting: Cardiovascular Disease

## 2020-10-15 ENCOUNTER — Telehealth: Payer: Self-pay

## 2020-10-15 NOTE — Telephone Encounter (Signed)
Patient could take molnupiravir or could get the monoclonal antibody infusion. Should discuss with PCP

## 2020-10-15 NOTE — Telephone Encounter (Signed)
Spoke with daughter and relayed message from [facility]  Advised that she contact PCP office for other recommendations.

## 2020-10-15 NOTE — Telephone Encounter (Signed)
[name] called and said that patient tested positive for covid. Want to know what could patient take since he is on heart medication. Please call back

## 2020-10-15 NOTE — Telephone Encounter (Signed)
Message routed to pharmacy team for assistance with possible meds patient could take for COVID

## 2020-10-15 NOTE — Telephone Encounter (Signed)
Please have daughter reach out to one of the [facility]. They just need to call the pharmacy and the pharmacist will determine if they qualify and will give further instructions.  [facility] at [facility] [address] [place], [place] [address]  [facility] at [facility]  [phone] [address] [place], [place] [address]

## 2020-10-15 NOTE — Telephone Encounter (Signed)
Called patient and informed of below.  ? ?Keith Newton Keith Mortellaro, RN ? ?

## 2020-10-15 NOTE — Telephone Encounter (Signed)
Daughter calls nurse line reporting covid test this morning. Daughter reports cough, headaches, and congestion. Daughter denies SOB or fevers. Daughter reports she spoke to his cardiologist and antivirals were suggested, however they do not prescribe. Daughter was told to reach out to PCP. Please advise.   Conservative measures given to patient and ED precautions given.

## 2020-10-16 ENCOUNTER — Other Ambulatory Visit (HOSPITAL_BASED_OUTPATIENT_CLINIC_OR_DEPARTMENT_OTHER): Payer: Self-pay

## 2020-10-16 ENCOUNTER — Other Ambulatory Visit (HOSPITAL_COMMUNITY): Payer: Self-pay

## 2020-10-16 MED ORDER — MOLNUPIRAVIR EUA 200MG CAPSULE
4.0000 | ORAL_CAPSULE | Freq: Two times a day (BID) | ORAL | 0 refills | Status: AC
  Filled 2020-10-16 (×2): qty 40, 5d supply, fill #0

## 2020-10-16 MED ORDER — PAXLOVID (300/100) 20 X 150 MG & 10 X 100MG PO TBPK
ORAL_TABLET | ORAL | 0 refills | Status: DC
  Filled 2020-10-16: qty 30, 5d supply, fill #0

## 2020-10-16 NOTE — Telephone Encounter (Signed)
Symptoms started on Monday. Daughter contacted and informed of antivirals at [facility].

## 2020-10-16 NOTE — Telephone Encounter (Signed)
Daughter returns call to nurse line. Reports that patient was not a candidate for medication that pharmacist could provide. Pharmacist recommended that patient contact PCP for possible rx of molnupiravir.   Please advise if this is appropriate for patient.   Veronda Prude, RN

## 2020-10-16 NOTE — Addendum Note (Signed)
Addended by: Littie Deeds D on: 10/16/2020 02:06 PM   Modules accepted: Orders

## 2020-10-17 ENCOUNTER — Ambulatory Visit: Payer: 59 | Admitting: Cardiovascular Disease

## 2020-10-18 ENCOUNTER — Other Ambulatory Visit (HOSPITAL_COMMUNITY): Payer: Self-pay

## 2020-10-22 ENCOUNTER — Ambulatory Visit: Payer: 59 | Admitting: Physical Therapy

## 2020-10-22 ENCOUNTER — Ambulatory Visit: Payer: 59 | Admitting: Family Medicine

## 2020-10-24 ENCOUNTER — Ambulatory Visit: Payer: 59 | Admitting: Physical Therapy

## 2020-11-05 ENCOUNTER — Ambulatory Visit: Payer: 59 | Attending: Family Medicine

## 2020-11-05 ENCOUNTER — Ambulatory Visit (INDEPENDENT_AMBULATORY_CARE_PROVIDER_SITE_OTHER): Payer: 59 | Admitting: Family Medicine

## 2020-11-05 ENCOUNTER — Other Ambulatory Visit: Payer: Self-pay

## 2020-11-05 VITALS — BP 105/76 | Ht 68.0 in | Wt 249.6 lb

## 2020-11-05 DIAGNOSIS — Z1159 Encounter for screening for other viral diseases: Secondary | ICD-10-CM | POA: Diagnosis not present

## 2020-11-05 DIAGNOSIS — E785 Hyperlipidemia, unspecified: Secondary | ICD-10-CM

## 2020-11-05 DIAGNOSIS — M6281 Muscle weakness (generalized): Secondary | ICD-10-CM | POA: Diagnosis present

## 2020-11-05 DIAGNOSIS — R293 Abnormal posture: Secondary | ICD-10-CM | POA: Insufficient documentation

## 2020-11-05 DIAGNOSIS — M25511 Pain in right shoulder: Secondary | ICD-10-CM

## 2020-11-05 DIAGNOSIS — E118 Type 2 diabetes mellitus with unspecified complications: Secondary | ICD-10-CM

## 2020-11-05 DIAGNOSIS — M25672 Stiffness of left ankle, not elsewhere classified: Secondary | ICD-10-CM | POA: Insufficient documentation

## 2020-11-05 LAB — POCT GLYCOSYLATED HEMOGLOBIN (HGB A1C): HbA1c, POC (controlled diabetic range): 9.9 % — AB (ref 0.0–7.0)

## 2020-11-05 MED ORDER — ACETAMINOPHEN 325 MG PO TABS: 650.0000 mg | ORAL_TABLET | Freq: Four times a day (QID) | ORAL | Status: AC | PRN

## 2020-11-05 NOTE — Therapy (Signed)
[place], [place], [address] Phone: [phone]   Fax:  [phone]  Physical Therapy Treatment/Re-Cert  Patient Details  Name: Keith Newton MRN: [id number] Date of Birth: 08-08-1955 Referring Provider (PT): Marlou Sa Tonna Corner, MD   Encounter Date: 11/05/2020   PT End of Session - 11/05/20 1029     Visit Number 22    Number of Visits 26    Date for PT Re-Evaluation 01/18/21    Authorization Type [employer]    Authorization - Number of Visits 30    Progress Note Due on Visit 1    PT Start Time 1025    PT Stop Time 1110    PT Time Calculation (min) 45 min    Activity Tolerance Patient tolerated treatment well;Patient limited by pain    Behavior During Therapy [facility] for tasks assessed/performed             Past Medical History:  Diagnosis Date   Arthritis    Back pain    CAD (coronary artery disease)    a. s/p NSTEMI in 12/2018 with 80% Prox-RCA stenosis followed by 100% prox to mid-RCA stenosis --> DESx2 to the RCA. Med management of residual disease.    Carpal tunnel syndrome    DDD (degenerative disc disease)    Diabetes mellitus    type 2   Hyperlipidemia    Hypertension    states very mild   Low testosterone    Myocardial infarction ([place])    Overweight    Seasonal allergies    Tachycardia     Past Surgical History:  Procedure Laterality Date   CARDIAC CATHETERIZATION     CARPAL TUNNEL RELEASE  07/02/2011   Procedure: CARPAL TUNNEL RELEASE;  Surgeon: Cammie Sickle., MD;  Location: [place];  Service: Orthopedics;  Laterality: Right;   CARPAL TUNNEL RELEASE  07/30/2011   Procedure: CARPAL TUNNEL RELEASE;  Surgeon: Cammie Sickle., MD;  Location: [place];  Service: Orthopedics;  Laterality: Left;   CORONARY ANGIOPLASTY     CORONARY STENT INTERVENTION N/A 12/23/2018   Procedure: CORONARY STENT INTERVENTION;  Surgeon: Jettie Booze,  MD;  Location: [place] [facility];  Service: Cardiovascular;  Laterality: N/A;   LEFT HEART CATH AND CORONARY ANGIOGRAPHY N/A 12/23/2018   Procedure: LEFT HEART CATH AND CORONARY ANGIOGRAPHY;  Surgeon: Jettie Booze, MD;  Location: [place] [facility];  Service: Cardiovascular;  Laterality: N/A;   LUMBAR EPIDURAL INJECTION     x2   SHOULDER ARTHROSCOPY WITH OPEN ROTATOR CUFF REPAIR AND DISTAL CLAVICLE ACROMINECTOMY Right 06/03/2020   Procedure: right shoulder arthroscopy, biceps tenodesis, mini open rotator cuff tear repair subscap/supraspinatus, lateral debridement;  Surgeon: Meredith Pel, MD;  Location: [place];  Service: Orthopedics;  Laterality: Right;    There were no vitals filed for this visit.   Subjective Assessment - 11/06/20 0602     Subjective Pt reports his r shoulder is about the same as the last PT session.    Pertinent History R/C repair (supraspinatus and subscap) with biceps tenodesis on 06/03/2020, heart attack 12/2020, DMT II    Limitations Lifting;Reading;Writing;House hold activities;Sitting    Patient Stated Goals get shoulder better    Currently in Pain? Yes    Pain Score 5     Pain Location Shoulder    Pain Orientation Right    Pain Descriptors / Indicators Aching;Tightness    Pain Type Chronic pain  Pain Onset More than a month ago    Pain Frequency Intermittent           Therapeutic Exercise to improve strength of shoulder to reduce pain and improve function.  Exercises selected to maximize therapeutic benefit while minimizing discomfort:  - Row - 3x10 - Black TB  - Shoulder Ext - 3x10 - Blue TB - Shoulder adduction - 3x15 R TB - with upward scapular rotation assist - S/L ER - 3# - 3x15 with assisted scapular retraction - Biceps curl - 3x10 @ 5# - IR with GTB - 3x10 (hold)    Therapeutic Activity:    Modalities:   Vasopneumatic (Game Ready)     Location:  right shoulder Time:  10 minutes Pressure:  medium Temperature:  38  degrees  Not completed this session: - UBE - 2.5'/2.5' - L2 -  while taking subjective - Tricep push down - 3x10 - black TB - wall slide with lift off   [facility] PT Assessment - 11/06/20 0001       AROM   Right Shoulder Flexion 90 Degrees   decreased AAROM=135d                       PT Short Term Goals - 09/26/20 2125       PT SHORT TERM GOAL #1   Title [name] will be >75% HEP compliant within 3 weeks to facilitate carryover between sessions and move towards eventual independent management of their condition.    Baseline Started on eval    Status On-going    Target Date 11/02/20               PT Long Term Goals - 11/06/20 0608       PT LONG TERM GOAL #2   Title [name] will demonstrate >130 degrees of active ROM, not limited by pain, in abduction and flexion to allow completion of activities involving reaching OH. 08/15/20: PROM= 145d, AAROM=130d, AROM=85d    Baseline 45 passive flexoin 8/23: 120 degrees AAROM. 11/05/20: AROM-90d c decreased shoulder shrug. AAROM (shoulder ladder)-135d    Status Partially Met    Target Date 01/18/21      PT LONG TERM GOAL #3   Title Bostyn Bogie will be able to reach [facility] into cabinet, not limited by pain    Baseline unable  8/23: Able to first shelf, but painful. 11/05/20: 1st shelf no problem, 2nd shelve but painful    Time 8    Status On-going    Target Date 01/18/21      PT LONG TERM GOAL #5   Title Romone Shaff will be compliant with post op precautions throughout therapy.    Baseline non compliant    Time 8    Period Weeks    Status On-going    Target Date 01/18/21      PT LONG TERM GOAL #6   Title Clemente Dewey will improve FOTO score from 30 to 66 as a proxy for functional improvement    Baseline 49%    Time 8    Period Weeks    Status On-going    Target Date 01/18/21                   Plan - 11/06/20 0607     Clinical Impression Statement Pt continues to make slow progress re: R  shoulder pain, ROM, and functional use. PT was completed today for rotator  cuff, periscapular, and biceps  strengthening. R shoulder AROM has make small gains with pt able to reach the 1st shelf c no increase in pain. Pt can reach the 2nd shelf, but with pain. With AROM, the shoulder demonstrates decreased shrugging. Pt will benefit from continued PT 1w8 to reduce R shoulder pain and to optimize its function.    Personal Factors and Comorbidities Fitness;Comorbidity 3+;Behavior Pattern    Comorbidities obesity, CAD, HTN, tachycardia, DM, HA    Examination-Activity Limitations Bathing;Carry;Dressing;Lift;Reach Overhead    Examination-Participation Restrictions Cleaning;Community Activity;Driving    Stability/Clinical Decision Making Stable/Uncomplicated    Clinical Decision Making Low    Rehab Potential Good    PT Frequency 1x / week    PT Duration 8 weeks    PT Treatment/Interventions ADLs/Self Care Home Management;Cryotherapy;Electrical Stimulation;Ultrasound;Moist Heat;Iontophoresis 82m/ml Dexamethasone;Gait training;Stair training;Functional mobility training;Therapeutic activities;Therapeutic exercise;Balance training;Patient/family education;Manual techniques;Dry needling;Passive range of motion;Taping;Vasopneumatic Device;Canalith Repostioning    PT Next Visit Plan Assess R shoulder pain status. Progress strengthening for the periscapular and rotator cuff as tolerated.    PT Home Exercise Plan [email or web address].    Consulted and Agree with Plan of Care Patient             Patient will benefit from skilled therapeutic intervention in order to improve the following deficits and impairments:  Decreased range of motion, Obesity, Decreased activity tolerance, Pain, Decreased strength, Decreased balance, Difficulty walking, Decreased knowledge of precautions, Impaired UE functional use  Visit Diagnosis: Right shoulder pain, unspecified chronicity  Decreased range of motion of left ankle  Muscle  weakness (generalized)  Abnormal posture     Problem List Patient Active Problem List   Diagnosis Date Noted   Fatigue due to excessive exertion 08/13/2020   Complete tear of right rotator cuff    Subluxation of tendon of long head of biceps    Surgery, elective 05/09/2020   Gastrocnemius strain, left 03/23/2020   Venous incompetence 12/28/2019   Exertional dyspnea 11/08/2019   Chronic right shoulder pain 09/02/2019   [place] 08/16/2019   Chest pain of uncertain etiology 054/49/2010  CAD -S/P PCI 01/02/2019   Obesity (BMI 30-39.9) 01/02/2019   Orthostatic dizziness 01/02/2019   Hyperlipidemia LDL goal <70 12/25/2018   History of non-ST elevation myocardial infarction (NSTEMI) 12/23/2018   Type 2 diabetes mellitus with complication (HCC)    Tachycardia    Hypertension    AGar PontoMS, PT 11/06/20 6:27 AM   [facility] [address]Phone: [phone]  Fax:  [phone] Name: GSheamus HastingMRN: [id number] of Birth: 105-30-1957

## 2020-11-05 NOTE — Assessment & Plan Note (Signed)
Poorly controlled, worsening.  Likely secondary to nonadherence to insulin.  Will not make any adjustments at this time.  Discussed following with pharmacy clinic but patient declined today.  Patient stated he will take his medications every day.  Plan to recheck A1c in 3 months and if not well controlled, will adjust regimen and consider sending to pharmacy clinic for assistance with insulin titration.  He is not on any ACE/ARB but his BP is soft so would not start today.

## 2020-11-05 NOTE — Assessment & Plan Note (Signed)
Lipid panel today

## 2020-11-05 NOTE — Patient Instructions (Addendum)
It was nice seeing you today!  Take your medicines every today.  See me back in 3 months.  If you are diabetes is still not under good control, but significantly pharmacy team.  Blood work today.  If everything is normal, I will send a letter to you in the mail.  Please arrive at least 15 minutes prior to your scheduled appointments.  Stay well, Littie Deeds, MD [facility] [phone]

## 2020-11-05 NOTE — Progress Notes (Signed)
    SUBJECTIVE:   CHIEF COMPLAINT / HPI:   T2DM Medications include metformin 1000 mg twice daily, empagliflozin 10 mg, and Trulicity 0.75 mg weekly, Tresiba 40 units daily Often forgets to take the insulin but is good about taking his oral medications.  It is unclear how often he is forgetting to take the insulin Does not check blood sugars in the morning Denies symptomatic hypoglycemia  PERTINENT  PMH / PSH: CAD, T2DM, HTN, HLD  OBJECTIVE:   BP 105/76   Ht 5\' 8"  (1.727 m)   Wt 249 lb 9.6 oz (113.2 kg)   SpO2 99%   BMI 37.95 kg/m   General: obese male, NAD CV: RRR, no murmurs Pulm: CTAB, no wheezes or rales  Lab Results  Component Value Date   HGBA1C 9.9 (A) 11/05/2020     ASSESSMENT/PLAN:   Hyperlipidemia LDL goal <70 Lipid panel today  Type 2 diabetes mellitus with complication (HCC) Poorly controlled, worsening.  Likely secondary to nonadherence to insulin.  Will not make any adjustments at this time.  Discussed following with pharmacy clinic but patient declined today.  Patient stated he will take his medications every day.  Plan to recheck A1c in 3 months and if not well controlled, will adjust regimen and consider sending to pharmacy clinic for assistance with insulin titration.  He is not on any ACE/ARB but his BP is soft so would not start today.   HCM - HCV screening ordered - flu shot declined - plans to get new Covid booster - Tdap declined  11/07/2020, MD [facility]

## 2020-11-06 ENCOUNTER — Telehealth: Payer: Self-pay | Admitting: Family Medicine

## 2020-11-06 LAB — LIPID PANEL
Chol/HDL Ratio: 4.7 ratio (ref 0.0–5.0)
Cholesterol, Total: 194 mg/dL (ref 100–199)
HDL: 41 mg/dL (ref >39)
LDL Chol Calc (NIH): 104 mg/dL — ABNORMAL HIGH (ref 0–99)
Triglycerides: 289 mg/dL — ABNORMAL HIGH (ref 0–149)
VLDL Cholesterol Cal: 49 mg/dL — ABNORMAL HIGH (ref 5–40)

## 2020-11-06 LAB — HCV AB W REFLEX TO QUANT PCR: HCV Ab: <0.1 s/co ratio (ref 0.0–0.9)

## 2020-11-06 LAB — HCV INTERPRETATION

## 2020-11-06 MED ORDER — EZETIMIBE 10 MG PO TABS: 10.0000 mg | ORAL_TABLET | Freq: Every day | ORAL | 3 refills | Status: DC

## 2020-11-06 NOTE — Telephone Encounter (Signed)
Called patient to discuss lab results. HCV screening negative. LDL above goal of 70 so will add ezetimibe. Patient in agreement.

## 2020-11-07 ENCOUNTER — Encounter: Payer: Self-pay | Admitting: Physical Therapy

## 2020-11-07 ENCOUNTER — Ambulatory Visit: Payer: 59 | Admitting: Physical Therapy

## 2020-11-07 ENCOUNTER — Other Ambulatory Visit: Payer: Self-pay

## 2020-11-07 DIAGNOSIS — R293 Abnormal posture: Secondary | ICD-10-CM

## 2020-11-07 DIAGNOSIS — M25511 Pain in right shoulder: Secondary | ICD-10-CM | POA: Diagnosis not present

## 2020-11-07 DIAGNOSIS — M25672 Stiffness of left ankle, not elsewhere classified: Secondary | ICD-10-CM

## 2020-11-07 DIAGNOSIS — M6281 Muscle weakness (generalized): Secondary | ICD-10-CM

## 2020-11-07 NOTE — Therapy (Signed)
[place], [place], [address] Phone: [phone]   Fax:  [phone]  Physical Therapy Treatment  Patient Details  Name: Keith Newton MRN: [id number] Date of Birth: 1955-07-29 Referring Provider (PT): Marlou Sa Tonna Corner, MD   Encounter Date: 11/07/2020   PT End of Session - 11/07/20 0830     Visit Number 23    Number of Visits 30    Date for PT Re-Evaluation 01/18/21    Authorization Type [employer]    Authorization - Number of Visits 30    Progress Note Due on Visit 30    PT Start Time 0830    PT Stop Time 0915    PT Time Calculation (min) 45 min    Activity Tolerance Patient tolerated treatment well;Patient limited by pain    Behavior During Therapy [facility] for tasks assessed/performed             Past Medical History:  Diagnosis Date   Arthritis    Back pain    CAD (coronary artery disease)    a. s/p NSTEMI in 12/2018 with 80% Prox-RCA stenosis followed by 100% prox to mid-RCA stenosis --> DESx2 to the RCA. Med management of residual disease.    Carpal tunnel syndrome    DDD (degenerative disc disease)    Diabetes mellitus    type 2   Hyperlipidemia    Hypertension    states very mild   Low testosterone    Myocardial infarction ([place])    Overweight    Seasonal allergies    Tachycardia     Past Surgical History:  Procedure Laterality Date   CARDIAC CATHETERIZATION     CARPAL TUNNEL RELEASE  07/02/2011   Procedure: CARPAL TUNNEL RELEASE;  Surgeon: Cammie Sickle., MD;  Location: [place];  Service: Orthopedics;  Laterality: Right;   CARPAL TUNNEL RELEASE  07/30/2011   Procedure: CARPAL TUNNEL RELEASE;  Surgeon: Cammie Sickle., MD;  Location: [place];  Service: Orthopedics;  Laterality: Left;   CORONARY ANGIOPLASTY     CORONARY STENT INTERVENTION N/A 12/23/2018   Procedure: CORONARY STENT INTERVENTION;  Surgeon: Jettie Booze, MD;   Location: [place] [facility];  Service: Cardiovascular;  Laterality: N/A;   LEFT HEART CATH AND CORONARY ANGIOGRAPHY N/A 12/23/2018   Procedure: LEFT HEART CATH AND CORONARY ANGIOGRAPHY;  Surgeon: Jettie Booze, MD;  Location: [place] [facility];  Service: Cardiovascular;  Laterality: N/A;   LUMBAR EPIDURAL INJECTION     x2   SHOULDER ARTHROSCOPY WITH OPEN ROTATOR CUFF REPAIR AND DISTAL CLAVICLE ACROMINECTOMY Right 06/03/2020   Procedure: right shoulder arthroscopy, biceps tenodesis, mini open rotator cuff tear repair subscap/supraspinatus, lateral debridement;  Surgeon: Meredith Pel, MD;  Location: [place];  Service: Orthopedics;  Laterality: Right;    There were no vitals filed for this visit.   Subjective Assessment - 11/07/20 0830     Subjective Pt reports that he has been having triceps pain on the R side for 2-3 weeks.  He is not sure why.  His R shoulder pain is 5/10 currenlty.  Aggs: reaching Eases: rest and ice.    Pertinent History R/C repair (supraspinatus and subscap) with biceps tenodesis on 06/03/2020, heart attack 12/2020, DMT II    Patient Stated Goals get shoulder better    Pain Onset More than a month ago             Therapeutic Exercise to improve  strength of shoulder to reduce pain and improve function.  Exercises selected to maximize therapeutic benefit while minimizing discomfort:  - UBE - 2.5'/2.5' - L2 -  while taking subjective - Tricep push down - 3x10 - RTB - Row - 3x10 - unilateral - Black TB (with mirror for cue) - Shoulder Ext - unilateral - 3x10 - Blue TB - Shoulder adduction - 3x15 R TB - with upward scapular rotation assist - S/L ER - 3# - 3x15 with assisted scapular retraction - Biceps curl - 3x10 @ 6# - IR with RTB - 3x10 - rhythmic stabilization - 30''x3     Modalities:   Vasopneumatic (Game Ready)     Location:  right shoulder Time:  10 minutes Pressure:  medium Temperature:  38 degrees   Not completed this session:  - wall  slide with lift off      PT Short Term Goals - 09/26/20 2125       PT SHORT TERM GOAL #1   Title [name] will be >75% HEP compliant within 3 weeks to facilitate carryover between sessions and move towards eventual independent management of their condition.    Baseline Started on eval    Status On-going    Target Date 11/02/20               PT Long Term Goals - 11/06/20 0608       PT LONG TERM GOAL #2   Title [name] will demonstrate >130 degrees of active ROM, not limited by pain, in abduction and flexion to allow completion of activities involving reaching OH. 08/15/20: PROM= 145d, AAROM=130d, AROM=85d    Baseline 45 passive flexoin 8/23: 120 degrees AAROM. 11/05/20: AROM-90d c decreased shoulder shrug. AAROM (shoulder ladder)-135d    Status Partially Met    Target Date 01/18/21      PT LONG TERM GOAL #3   Title [name] will be able to reach [facility] into cabinet, not limited by pain    Baseline unable  8/23: Able to first shelf, but painful. 11/05/20: 1st shelf no problem, 2nd shelve but painful    Time 8    Status On-going    Target Date 01/18/21      PT LONG TERM GOAL #5   Title [name] will be compliant with post op precautions throughout therapy.    Baseline non compliant    Time 8    Period Weeks    Status On-going    Target Date 01/18/21      PT LONG TERM GOAL #6   Title [name] will improve FOTO score from 30 to 66 as a proxy for functional improvement    Baseline 49%    Time 8    Period Weeks    Status On-going    Target Date 01/18/21                   Plan - 11/07/20 0856     Clinical Impression Statement Pt reports no increase in baseline pain following therapy  HEP was reviewed, but left unchanged    Overall, Keith Newton is progressing fair with therapy.  Today we concentrated on rotator cuff strengthening and periscapular strengthening.  Pt has slightly higher than expected baseline pain today.  He also endorses some  R tricps pain with TTP over muscle belly.  We moved to some unilateral band work to encourage core activation with the corresponding movements.  Pt will continue to benefit from skilled physical therapy  to address remaining deficits and achieve listed goals.  Continue per POC.    Personal Factors and Comorbidities Fitness;Comorbidity 3+;Behavior Pattern    Comorbidities obesity, CAD, HTN, tachycardia, DM, HA    Examination-Activity Limitations Bathing;Carry;Dressing;Lift;Reach Overhead    Examination-Participation Restrictions Cleaning;Community Activity;Driving    Stability/Clinical Decision Making Stable/Uncomplicated    Rehab Potential Good    PT Frequency 1x / week    PT Duration 8 weeks    PT Treatment/Interventions ADLs/Self Care Home Management;Cryotherapy;Electrical Stimulation;Ultrasound;Moist Heat;Iontophoresis 22m/ml Dexamethasone;Gait training;Stair training;Functional mobility training;Therapeutic activities;Therapeutic exercise;Balance training;Patient/family education;Manual techniques;Dry needling;Passive range of motion;Taping;Vasopneumatic Device;Canalith Repostioning    PT Next Visit Plan Assess R shoulder pain status. Progress strengthening for the periscapular and rotator cuff as tolerated.    PT Home Exercise Plan [email or web address].    Consulted and Agree with Plan of Care Patient             Patient will benefit from skilled therapeutic intervention in order to improve the following deficits and impairments:  Decreased range of motion, Obesity, Decreased activity tolerance, Pain, Decreased strength, Decreased balance, Difficulty walking, Decreased knowledge of precautions, Impaired UE functional use  Visit Diagnosis: Right shoulder pain, unspecified chronicity  Decreased range of motion of left ankle  Muscle weakness (generalized)  Abnormal posture  Muscle weakness     Problem List Patient Active Problem List   Diagnosis Date Noted   Fatigue due to excessive  exertion 08/13/2020   Complete tear of right rotator cuff    Subluxation of tendon of long head of biceps    Surgery, elective 05/09/2020   Gastrocnemius strain, left 03/23/2020   Venous incompetence 12/28/2019   Exertional dyspnea 11/08/2019   Chronic right shoulder pain 09/02/2019   Diabetic [place] ([place] 08/16/2019   Chest pain of uncertain etiology 022/41/1464  CAD -S/P PCI 01/02/2019   Obesity (BMI 30-39.9) 01/02/2019   Orthostatic dizziness 01/02/2019   Hyperlipidemia LDL goal <70 12/25/2018   History of non-ST elevation myocardial infarction (NSTEMI) 12/23/2018   Type 2 diabetes mellitus with complication (HCC)    Tachycardia    Hypertension     KMathis Dad PT 11/07/2020, 9:08 AM  [facility]19[address]GNeck City [place] [address]Phone: [phone]  Fax:  [phone] Name: GGavriel HolzhauerMRN: [id number] of Birth: [birth year]

## 2020-11-12 ENCOUNTER — Ambulatory Visit: Payer: 59

## 2020-11-17 ENCOUNTER — Other Ambulatory Visit: Payer: Self-pay | Admitting: Family Medicine

## 2020-11-19 ENCOUNTER — Ambulatory Visit: Payer: 59

## 2020-11-25 ENCOUNTER — Other Ambulatory Visit: Payer: Self-pay

## 2020-11-25 MED ORDER — PREGABALIN 75 MG PO CAPS: 75.0000 mg | ORAL_CAPSULE | Freq: Three times a day (TID) | ORAL | 1 refills | Status: DC

## 2020-11-30 ENCOUNTER — Other Ambulatory Visit: Payer: Self-pay | Admitting: Family Medicine

## 2020-12-03 ENCOUNTER — Ambulatory Visit: Payer: 59 | Attending: Family Medicine

## 2020-12-03 ENCOUNTER — Other Ambulatory Visit: Payer: Self-pay

## 2020-12-03 DIAGNOSIS — M6281 Muscle weakness (generalized): Secondary | ICD-10-CM

## 2020-12-03 DIAGNOSIS — R293 Abnormal posture: Secondary | ICD-10-CM | POA: Insufficient documentation

## 2020-12-03 DIAGNOSIS — M25672 Stiffness of left ankle, not elsewhere classified: Secondary | ICD-10-CM

## 2020-12-03 DIAGNOSIS — M25511 Pain in right shoulder: Secondary | ICD-10-CM

## 2020-12-03 NOTE — Therapy (Signed)
[place], [place], [address] Phone: [phone]   Fax:  [phone]  Physical Therapy Treatment  Patient Details  Name: Keith Newton MRN: [id number] Date of Birth: 1955/10/19 Referring Provider (PT): Marlou Sa Tonna Corner, MD   Encounter Date: 12/03/2020   PT End of Session - 12/03/20 1116     Visit Number 24    Number of Visits 30    Date for PT Re-Evaluation 01/18/21    Authorization Type [employer]    Authorization - Number of Visits 30    Progress Note Due on Visit 30    PT Start Time 1104    PT Stop Time 1201    PT Time Calculation (min) 57 min    Activity Tolerance Patient tolerated treatment well;Patient limited by pain    Behavior During Therapy [facility] for tasks assessed/performed             Past Medical History:  Diagnosis Date   Arthritis    Back pain    CAD (coronary artery disease)    a. s/p NSTEMI in 12/2018 with 80% Prox-RCA stenosis followed by 100% prox to mid-RCA stenosis --> DESx2 to the RCA. Med management of residual disease.    Carpal tunnel syndrome    DDD (degenerative disc disease)    Diabetes mellitus    type 2   Hyperlipidemia    Hypertension    states very mild   Low testosterone    Myocardial infarction ([place])    Overweight    Seasonal allergies    Tachycardia     Past Surgical History:  Procedure Laterality Date   CARDIAC CATHETERIZATION     CARPAL TUNNEL RELEASE  07/02/2011   Procedure: CARPAL TUNNEL RELEASE;  Surgeon: Cammie Sickle., MD;  Location: [place];  Service: Orthopedics;  Laterality: Right;   CARPAL TUNNEL RELEASE  07/30/2011   Procedure: CARPAL TUNNEL RELEASE;  Surgeon: Cammie Sickle., MD;  Location: [place];  Service: Orthopedics;  Laterality: Left;   CORONARY ANGIOPLASTY     CORONARY STENT INTERVENTION N/A 12/23/2018   Procedure: CORONARY STENT INTERVENTION;  Surgeon: Jettie Booze, MD;   Location: [place] [facility];  Service: Cardiovascular;  Laterality: N/A;   LEFT HEART CATH AND CORONARY ANGIOGRAPHY N/A 12/23/2018   Procedure: LEFT HEART CATH AND CORONARY ANGIOGRAPHY;  Surgeon: Jettie Booze, MD;  Location: [place] [facility];  Service: Cardiovascular;  Laterality: N/A;   LUMBAR EPIDURAL INJECTION     x2   SHOULDER ARTHROSCOPY WITH OPEN ROTATOR CUFF REPAIR AND DISTAL CLAVICLE ACROMINECTOMY Right 06/03/2020   Procedure: right shoulder arthroscopy, biceps tenodesis, mini open rotator cuff tear repair subscap/supraspinatus, lateral debridement;  Surgeon: Meredith Pel, MD;  Location: [place];  Service: Orthopedics;  Laterality: Right;    There were no vitals filed for this visit.   Subjective Assessment - 12/03/20 1113     Subjective Pt reports his Rt shoulder pain is 25% better. Generally not painful at rest, but is sore after use.    Pertinent History R/C repair (supraspinatus and subscap) with biceps tenodesis on 06/03/2020, heart attack 12/2020, DMT II    Limitations Lifting;Reading;Writing;House hold activities;Sitting    Patient Stated Goals get shoulder better    Currently in Pain? No/denies    Pain Score --   generally 5/10 soreness with use   Pain Location Shoulder    Pain Orientation Right    Pain Descriptors /  Indicators Aching    Pain Onset More than a month ago    Pain Frequency Intermittent    Aggravating Factors  Movement, sleeping positions    Pain Relieving Factors Ice pack, rest, tylenol              Therapeutic Exercise to improve strength of shoulder to reduce pain and improve function.  Exercises selected to maximize therapeutic benefit while minimizing discomfort:   - UBE - 2.5'/2.5' - L2 -  while taking subjective - Tricep push down - 3x10 - RTB - Row - 3x10 - unilateral - Blue TB  - Shoulder Ext - unilateral - 3x10 - Blue TB - Shoulder adduction - 3x15 R TB - with upward scapular rotation assist - S/L ER - 3# - 3x15 with  assisted scapular retraction - Biceps curl - 3x10 @ 6# - IR with RTB - 3x10 - wall slide R shoulder flexion, 10x   Modalities:   Vasopneumatic (Game Ready)     Location:  right shoulder Time:  15 minutes Pressure:  medium Temperature:  38 degrees   Not completed this session: - rhythmic stabilization - 30''x3     [facility] PT Assessment - 12/03/20 0001       AROM   Right Shoulder Flexion 90 Degrees                                      PT Short Term Goals - 09/26/20 2125       PT SHORT TERM GOAL #1   Title [name] will be >75% HEP compliant within 3 weeks to facilitate carryover between sessions and move towards eventual independent management of their condition.    Baseline Started on eval    Status On-going    Target Date 11/02/20               PT Long Term Goals - 12/03/20 1250       PT LONG TERM GOAL #2   Title [name] will demonstrate >130 degrees of active ROM, not limited by pain, in abduction and flexion to allow completion of activities involving reaching [facility]. 08/15/20: PROM= 145d, AAROM=130d, AROM=85d. 12/03/20: AROM: Rt shoulder 90d    Baseline 45 passive flexoin 8/23: 120 degrees AAROM. 11/05/20: AROM-90d c decreased shoulder shrug. AAROM (shoulder ladder)-135d    Status Partially Met                   Plan - 12/03/20 1249     Clinical Impression Statement Pt returns to PT after 3.5 weeks. Pt's subjective report indicates some overall improvement in pain, 25%. AROM of the r shoulder reamins the same at 90d AROM. PT continued to focus on the R rotator cuff and the periscapular muscles. The vasopneumatic device was applied post PT for symptom management. Advised pt to attend PT consistently to oder for him to achieve improved functional use of his R UE.    Personal Factors and Comorbidities Fitness;Comorbidity 3+;Behavior Pattern    Comorbidities obesity, CAD, HTN, tachycardia, DM, HA    Examination-Activity  Limitations Bathing;Carry;Dressing;Lift;Reach Overhead    Examination-Participation Restrictions Cleaning;Community Activity;Driving    Stability/Clinical Decision Making Stable/Uncomplicated    Clinical Decision Making Low    Rehab Potential Good    PT Frequency 1x / week    PT Treatment/Interventions ADLs/Self Care Home Management;Cryotherapy;Electrical Stimulation;Ultrasound;Moist Heat;Iontophoresis 54m/ml Dexamethasone;Gait training;Stair training;Functional mobility training;Therapeutic activities;Therapeutic exercise;Balance training;Patient/family education;Manual  techniques;Dry needling;Passive range of motion;Taping;Vasopneumatic Device;Canalith Repostioning    PT Next Visit Plan Assess R shoulder pain status. Progress strengthening for the periscapular and rotator cuff as tolerated.    PT Home Exercise Plan [email or web address].    Consulted and Agree with Plan of Care Patient             Patient will benefit from skilled therapeutic intervention in order to improve the following deficits and impairments:  Decreased range of motion, Obesity, Decreased activity tolerance, Pain, Decreased strength, Decreased balance, Difficulty walking, Decreased knowledge of precautions, Impaired UE functional use  Visit Diagnosis: Right shoulder pain, unspecified chronicity  Decreased range of motion of left ankle  Muscle weakness (generalized)  Muscle weakness     Problem List Patient Active Problem List   Diagnosis Date Noted   Fatigue due to excessive exertion 08/13/2020   Complete tear of right rotator cuff    Subluxation of tendon of long head of biceps    Surgery, elective 05/09/2020   Gastrocnemius strain, left 03/23/2020   Venous incompetence 12/28/2019   Exertional dyspnea 11/08/2019   Chronic right shoulder pain 09/02/2019   Diabetic neuropathy ([place]) 08/16/2019   Chest pain of uncertain etiology 03/83/3383   CAD -S/P PCI 01/02/2019   Obesity (BMI 30-39.9) 01/02/2019   Orthostatic  dizziness 01/02/2019   Hyperlipidemia LDL goal <70 12/25/2018   History of non-ST elevation myocardial infarction (NSTEMI) 12/23/2018   Type 2 diabetes mellitus with complication (HCC)    Tachycardia    Hypertension     Gar Ponto MS, PT 12/03/20 1:02 PM   [facility] [address] [place], [place], [address] Phone: [phone]   Fax:  [phone]  Name: Keith Newton MRN: [id number] Date of Birth: 1956/02/06

## 2020-12-10 NOTE — Progress Notes (Signed)
Cardiology Office Note:    Date:  12/11/2020   ID:  Keith Newton, DOB 05-06-1955, MRN [id number]  PCP:  Zola Button, [place] Cardiologist: Shelva Majestic, MD   Reason for visit: 35-monthfollow-up  History of Present Illness:    Keith Newton a 65y.o. male with a hx of diabetes, obesity, hypertension, CAD status post stenting to the RCA in 2020.  He was last seen in March 2022 for preop clearance for shoulder surgery.  He has chronic chest pain and takes Imdur and metoprolol titrate for anti-anginal therapy.  Today, he denies chest pain and shortness of breath.  He denies PND, palpitations and lower extremity edema.  He occasionally has lightheadedness, denies presyncope.  He states that his daughter comes over every day and fills his pill pack.  We got her on the phone.  She states that he was not taking his medications regularly while he had COVID.  They saw their PCP last month and noted increase in his cholesterol and A1c.  With his LDL over 70, I offered to start PCSK9 inhibitors today first recheck lipids in 3 months after taking his medications more regularly.  Patient wishes to recheck lipids in 3 months.  His daughter states he could be more active to help his fatigue.  She mentions that he does not eat healthy.  Patient mention to nursing staff that he did not know why he had to come to this appointment.  He states that he does not have sleep apnea, he previously refused to have a sleep study done.   Past Medical History:  Diagnosis Date   Arthritis    Back pain    CAD (coronary artery disease)    a. s/p NSTEMI in 12/2018 with 80% Prox-RCA stenosis followed by 100% prox to mid-RCA stenosis --> DESx2 to the RCA. Med management of residual disease.    Carpal tunnel syndrome    DDD (degenerative disc disease)    Diabetes mellitus    type 2   Hyperlipidemia    Hypertension    states very mild   Low testosterone    [place]    Overweight     Seasonal allergies    Tachycardia     Past Surgical History:  Procedure Laterality Date   CARDIAC CATHETERIZATION     CARPAL TUNNEL RELEASE  07/02/2011   Procedure: CARPAL TUNNEL RELEASE;  Surgeon: RCammie Sickle, MD;  [place]  Service: Orthopedics;  Laterality: Right;   CARPAL TUNNEL RELEASE  07/30/2011   Procedure: CARPAL TUNNEL RELEASE;  Surgeon: RCammie Sickle, MD;  [place]  Service: Orthopedics;  Laterality: Left;   CORONARY ANGIOPLASTY     CORONARY STENT INTERVENTION N/A 12/23/2018   Procedure: CORONARY STENT INTERVENTION;  Surgeon: VJettie Booze MD;  [facility];  Service: Cardiovascular;  Laterality: N/A;   LEFT HEART CATH AND CORONARY ANGIOGRAPHY N/A 12/23/2018   Procedure: LEFT HEART CATH AND CORONARY ANGIOGRAPHY;  Surgeon: VJettie Booze MD;  [facility];  Service: Cardiovascular;  Laterality: N/A;   LUMBAR EPIDURAL INJECTION     x2   SHOULDER ARTHROSCOPY WITH OPEN ROTATOR CUFF REPAIR AND DISTAL CLAVICLE ACROMINECTOMY Right 06/03/2020   Procedure: right shoulder arthroscopy, biceps tenodesis, mini open rotator cuff tear repair subscap/supraspinatus, lateral debridement;  Surgeon: DMeredith Pel MD;  [place] [facility]  Service: Orthopedics;  Laterality: Right;    Current  Medications: Current Meds  Medication Sig   acetaminophen (TYLENOL) 325 MG tablet Take 2 tablets (650 mg total) by mouth every 6 (six) hours as needed.   aspirin EC 81 MG EC tablet Take 1 tablet (81 mg total) by mouth daily.   atorvastatin (LIPITOR) 80 MG tablet TAKE 1 TABLET BY MOUTH EVERY DAY (Patient taking differently: Take 80 mg by mouth daily.)   Blood Glucose Monitoring Suppl (ONETOUCH VERIO IQ SYSTEM) w/Device KIT Check fasting blood glucose in AM and before giving insulin.   BRILINTA 90 MG TABS tablet TAKE 1 TABLET BY MOUTH TWO TIMES DAILY (Patient taking differently: Take 90 mg by mouth  2 (two) times daily.)   dapagliflozin propanediol (FARXIGA) 10 MG TABS tablet Take 1 tablet (10 mg total) by mouth daily.   Elastic Bandages & Supports (MEDICAL COMPRESSION STOCKINGS) MISC Wear daily   ezetimibe (ZETIA) 10 MG tablet Take 1 tablet (10 mg total) by mouth daily.   glucose blood (ONETOUCH VERIO) test strip Use as instructed   insulin degludec (TRESIBA FLEXTOUCH) 100 UNIT/ML FlexTouch Pen Inject 40 Units into the skin daily.   isosorbide mononitrate (IMDUR) 60 MG 24 hr tablet TAKE 1 TABLET BY MOUTH EVERY DAY   Lancet Devices (ONE TOUCH DELICA LANCING DEV) MISC Check fasting blood glucose in AM and before giving insulin.   metFORMIN (GLUCOPHAGE) 1000 MG tablet TAKE 1 TABLET BY MOUTH TWICE A DAY   nitroGLYCERIN (NITROSTAT) 0.4 MG SL tablet Place 1 tablet (0.4 mg total) under the tongue every 5 (five) minutes x 3 doses as needed for chest pain.   NOVOFINE PLUS PEN NEEDLE 32G X 4 MM MISC USE WITH TRESIBA DAILY   OneTouch Delica Lancets 37J MISC Check fasting blood glucose in AM and before giving insulin.   pregabalin (LYRICA) 75 MG capsule Take 1 capsule (75 mg total) by mouth 3 (three) times daily.   TRULICITY 6.96 VE/9.3YB SOPN INJECT 0.75 MG INTO THE SKIN ONCE A WEEK.     Allergies:   Codeine   Social History   Socioeconomic History   Marital status: Married    Spouse name: Travonte Byard   Number of children: 2   Years of education: 16   Highest education level: Bachelor's degree (e.g., BA, AB, BS)  Occupational History   Occupation: retired  Tobacco Use   Smoking status: Never   Smokeless tobacco: Never  Vaping Use   Vaping Use: Never used  Substance and Sexual Activity   Alcohol use: No   Drug use: No   Sexual activity: Not on file  Other Topics Concern   Not on file  Social History Narrative   Not on file   Social Determinants of Health   Financial Resource Strain: Not on file  Food Insecurity: Not on file  Transportation Needs: Not on file  Physical  Activity: Not on file  Stress: Not on file  Social Connections: Not on file     Family History: The patient's family history includes CAD in his father, maternal grandfather, and paternal grandfather; Stroke in his mother.  ROS:   Please see the history of present illness.     EKGs/Labs/Other Studies Reviewed:    EKG:  The ekg ordered today demonstrates borderline sinus tachycardia, heart rate 103, PR interval 176 ms, QRS duration 76 ms.  Recent Labs: 06/03/2020: BUN 16; Creatinine, Ser 1.03; Potassium 4.5; Sodium 139 08/13/2020: Hemoglobin 15.1; Platelets 330; TSH 1.490   Recent Lipid Panel Lab Results  Component Value Date/Time  CHOL 194 11/05/2020 03:45 PM   TRIG 289 (H) 11/05/2020 03:45 PM   HDL 41 11/05/2020 03:45 PM   LDLCALC 104 (H) 11/05/2020 03:45 PM    Physical Exam:    VS:  BP (!) 126/92 (BP Location: Right Arm, Patient Position: Sitting, Cuff Size: Large)   Pulse (!) 103   Ht 5' 8"  (1.727 m)   Wt 252 lb (114.3 kg)   BMI 38.32 kg/m    No data found.  Wt Readings from Last 3 Encounters:  12/11/20 252 lb (114.3 kg)  11/05/20 249 lb 9.6 oz (113.2 kg)  08/13/20 242 lb 12.8 oz (110.1 kg)     GEN:  Obese, in no acute distress, poor eye contact HEENT: Normal NECK: No JVD; No carotid bruits CARDIAC: RRR, no murmurs, rubs, gallops RESPIRATORY:  Clear to auscultation without rales, wheezing or rhonchi  ABDOMEN: Soft, non-tender, non-distended MUSCULOSKELETAL: No edema; No deformity  SKIN: Warm and dry NEUROLOGIC:  Alert and oriented PSYCHIATRIC:  Normal affect     ASSESSMENT AND PLAN   CAD, no angina - s/p stenting to RCA; residual 75% stenosis in RPDA (small vessel-tx medically), 50% stenosis in mid circumflex and 40% stenosis in LAD. -Continue Imdur and metoprolol titrate for antianginal therapy. -Encourage diabetic control and weight loss. -Will discuss with Dr. Claiborne Billings if patient should decrease his Brilinta dose from 90 mg to 60 mg twice daily for  long-term prevention.  Diabetes, uncontrolled -Hemoglobin A1c last month was 9.9%.  Discussed goal A1c around 7% for secondary risk prevention. -Management per PCP.  Hypertension, reasonably controlled -Continue current medications. -Goal BP is <130/80.  Recommend DASH diet (high in vegetables, fruits, low-fat dairy products, whole grains, poultry, fish, and nuts and low in sweets, sugar-sweetened beverages, and red meats), salt restriction and increase physical activity.  Hyperlipidemia, uncontrolled -LDL 104 in September 2022.  Currently on Lipitor 80 mg and Zetia 10 mg daily.  LDL previously 55 in January 2021.  Recommend taking medications regularly and will recheck lipids in 3 months.  If LDL is still over 70, recommend PCSK9 inhibitors (pt already takes another injectable - insulin). -Discussed cholesterol lowering diets - Mediterranean diet, DASH diet, vegetarian diet, low-carbohydrate diet and avoidance of trans fats.  Discussed healthier choice substitutes.  Nuts, high-fiber foods, and fiber supplements may also improve lipids.    Obesity -Discussed how even a 5-10% weight loss can have cardiovascular benefits.   -Recommend moderate intensity activity for 30 minutes 5 days/week and the DASH diet.  Disposition - Follow-up in 3 months to follow-up risk factor mitigation.  Recheck lipids at that time.    Medication Adjustments/Labs and Tests Ordered: Current medicines are reviewed at length with the patient today.  Concerns regarding medicines are outlined above.  Orders Placed This Encounter  Procedures   Lipid panel   EKG 12-Lead    No orders of the defined types were placed in this encounter.   Patient Instructions  Medication Instructions:  No Changes *If you need a refill on your cardiac medications before your next appointment, please call your pharmacy*   Lab Work: Lipids : To Be done In 3 Months If you have labs (blood work) drawn today and your tests are  completely normal, you will receive your results only by: [place] (if you have MyChart) OR A paper copy in the mail If you have any lab test that is abnormal or we need to change your treatment, we will call you to review the results.  Testing/Procedures: No Testing   Follow-Up: At [facility], you and your health needs are our priority.  As part of our continuing mission to provide you with exceptional heart care, we have created designated Provider Care Teams.  These Care Teams include your primary Cardiologist (physician) and Advanced Practice Providers (APPs -  Physician Assistants and Nurse Practitioners) who all work together to provide you with the care you need, when you need it.  We recommend signing up for the patient portal called "MyChart".  Sign up information is provided on this After Visit Summary.  MyChart is used to connect with patients for Virtual Visits (Telemedicine).  Patients are able to view lab/test results, encounter notes, upcoming appointments, etc.  Non-urgent messages can be sent to your provider as well.   To learn more about what you can do with MyChart, go to [email or web address].    Your next appointment:   3 month(s)  The format for your next appointment:   In Person  Provider:   Shelva Majestic, MD or Caron Presume, PA-C       Signed, Warren Lacy, PA-C  12/11/2020 8:58 AM    [place]

## 2020-12-11 ENCOUNTER — Ambulatory Visit (INDEPENDENT_AMBULATORY_CARE_PROVIDER_SITE_OTHER): Payer: 59 | Admitting: Physician Assistant

## 2020-12-11 ENCOUNTER — Other Ambulatory Visit: Payer: Self-pay

## 2020-12-11 VITALS — BP 126/92 | HR 103 | Ht 68.0 in | Wt 252.0 lb

## 2020-12-11 DIAGNOSIS — E785 Hyperlipidemia, unspecified: Secondary | ICD-10-CM

## 2020-12-11 DIAGNOSIS — I2510 Atherosclerotic heart disease of native coronary artery without angina pectoris: Secondary | ICD-10-CM | POA: Diagnosis not present

## 2020-12-11 DIAGNOSIS — I10 Essential (primary) hypertension: Secondary | ICD-10-CM

## 2020-12-11 DIAGNOSIS — E118 Type 2 diabetes mellitus with unspecified complications: Secondary | ICD-10-CM

## 2020-12-11 DIAGNOSIS — Z9861 Coronary angioplasty status: Secondary | ICD-10-CM

## 2020-12-11 DIAGNOSIS — E669 Obesity, unspecified: Secondary | ICD-10-CM

## 2020-12-11 NOTE — Patient Instructions (Addendum)
Medication Instructions:  No Changes *If you need a refill on your cardiac medications before your next appointment, please call your pharmacy*   Lab Work: Lipids : To Be done In 3 Months If you have labs (blood work) drawn today and your tests are completely normal, you will receive your results only by: MyChart Message (if you have MyChart) OR A paper copy in the mail If you have any lab test that is abnormal or we need to change your treatment, we will call you to review the results.   Testing/Procedures: No Testing   Follow-Up: At [facility], you and your health needs are our priority.  As part of our continuing mission to provide you with exceptional heart care, we have created designated Provider Care Teams.  These Care Teams include your primary Cardiologist (physician) and Advanced Practice Providers (APPs -  Physician Assistants and Nurse Practitioners) who all work together to provide you with the care you need, when you need it.  We recommend signing up for the patient portal called "MyChart".  Sign up information is provided on this After Visit Summary.  MyChart is used to connect with patients for Virtual Visits (Telemedicine).  Patients are able to view lab/test results, encounter notes, upcoming appointments, etc.  Non-urgent messages can be sent to your provider as well.   To learn more about what you can do with MyChart, go to [email or web address].    Your next appointment:   3 month(s)  The format for your next appointment:   In Person  Provider:   Nicki [place], MD or Juanda Crumble, PA-C

## 2020-12-12 ENCOUNTER — Ambulatory Visit: Payer: 59

## 2020-12-12 DIAGNOSIS — M25672 Stiffness of left ankle, not elsewhere classified: Secondary | ICD-10-CM

## 2020-12-12 DIAGNOSIS — M6281 Muscle weakness (generalized): Secondary | ICD-10-CM

## 2020-12-12 DIAGNOSIS — M25511 Pain in right shoulder: Secondary | ICD-10-CM

## 2020-12-12 DIAGNOSIS — R293 Abnormal posture: Secondary | ICD-10-CM

## 2020-12-12 NOTE — Therapy (Signed)
[place], [place], [address] Phone: [phone]   Fax:  [phone]  Physical Therapy Treatment  Patient Details  Name: Keith Newton MRN: [id number] Date of Birth: [birth year] Referring Provider (PT): Marlou Sa Tonna Corner, MD   Encounter Date: 12/12/2020   PT End of Session - 12/12/20 1138     Visit Number 25    Date for PT Re-Evaluation 01/18/21    Authorization Type [employer]    Authorization - Number of Visits 30    Progress Note Due on Visit 82    PT Start Time 1020    PT Stop Time 1120    PT Time Calculation (min) 60 min    Activity Tolerance Patient tolerated treatment well;Patient limited by pain    Behavior During Therapy [facility] for tasks assessed/performed             Past Medical History:  Diagnosis Date   Arthritis    Back pain    CAD (coronary artery disease)    a. s/p NSTEMI in 12/2018 with 80% Prox-RCA stenosis followed by 100% prox to mid-RCA stenosis --> DESx2 to the RCA. Med management of residual disease.    Carpal tunnel syndrome    DDD (degenerative disc disease)    Diabetes mellitus    type 2   Hyperlipidemia    Hypertension    states very mild   Low testosterone    Myocardial infarction ([place])    Overweight    Seasonal allergies    Tachycardia     Past Surgical History:  Procedure Laterality Date   CARDIAC CATHETERIZATION     CARPAL TUNNEL RELEASE  07/02/2011   Procedure: CARPAL TUNNEL RELEASE;  Surgeon: Cammie Sickle., MD;  Location: [place];  Service: Orthopedics;  Laterality: Right;   CARPAL TUNNEL RELEASE  07/30/2011   Procedure: CARPAL TUNNEL RELEASE;  Surgeon: Cammie Sickle., MD;  Location: [place];  Service: Orthopedics;  Laterality: Left;   CORONARY ANGIOPLASTY     CORONARY STENT INTERVENTION N/A 12/23/2018   Procedure: CORONARY STENT INTERVENTION;  Surgeon: Jettie Booze, MD;  Location: [place] [facility];  Service: Cardiovascular;  Laterality: N/A;   LEFT HEART CATH AND CORONARY ANGIOGRAPHY N/A 12/23/2018   Procedure: LEFT HEART CATH AND CORONARY ANGIOGRAPHY;  Surgeon: Jettie Booze, MD;  Location: [place] [facility];  Service: Cardiovascular;  Laterality: N/A;   LUMBAR EPIDURAL INJECTION     x2   SHOULDER ARTHROSCOPY WITH OPEN ROTATOR CUFF REPAIR AND DISTAL CLAVICLE ACROMINECTOMY Right 06/03/2020   Procedure: right shoulder arthroscopy, biceps tenodesis, mini open rotator cuff tear repair subscap/supraspinatus, lateral debridement;  Surgeon: Meredith Pel, MD;  Location: [place];  Service: Orthopedics;  Laterality: Right;    There were no vitals filed for this visit.  Therapeutic Exercise to improve strength of shoulder to reduce pain and improve function.  Exercises selected to maximize therapeutic benefit while minimizing discomfort:   - UBE - 2.5'/2.5' - L2 -  while taking subjective - Tricep push down - 2x15 - RTB - Row - 2x15 - Blue TB  - Shoulder Ext - 2x15 - Blue TB - Serratus punch- 2x15 - Shoulder scaption - 2x10, no wieght- with upward scapular rotation assist - IR with RTB - 3x10    Modalities:   Vasopneumatic (Game Ready)     Location:  right shoulder Time:  15 minutes Pressure:  medium Temperature:  38 degrees  Not completed this session: - rhythmic stabilization - 30''x3 - wall slide R shoulder flexion, 10x - S/L ER - 3# - 3x15 with assisted scapular retraction - Biceps curl - 3x10 @ 6#  Subjective Assessment - 12/12/20 1042     Subjective Pt reports soreness of his R shoulder and tricep, with the tricep being the most noticable.    Pertinent History R/C repair (supraspinatus and subscap) with biceps tenodesis on 06/03/2020, heart attack 12/2020, DMT II    Patient Stated Goals get shoulder better    Currently in Pain? Yes    Pain Score 3     Pain Orientation Right    Pain Descriptors / Indicators Aching    Pain Type Chronic pain    Pain Onset  More than a month ago    Pain Frequency Intermittent    Aggravating Factors  Movement, sleeping positions, pt reports rainy weather    Pain Relieving Factors Ice pack, rest, tylenol                [facility] PT Assessment - 12/12/20 0001       AROM   Right Shoulder Flexion 93 Degrees   with shrugging                                     PT Short Term Goals - 09/26/20 2125       PT SHORT TERM GOAL #1   Title [name] will be >75% HEP compliant within 3 weeks to facilitate carryover between sessions and move towards eventual independent management of their condition.    Baseline Started on eval    Status On-going    Target Date 11/02/20               PT Long Term Goals - 12/03/20 1250       PT LONG TERM GOAL #2   Title [name] will demonstrate >130 degrees of active ROM, not limited by pain, in abduction and flexion to allow completion of activities involving reaching [facility]. 08/15/20: PROM= 145d, AAROM=130d, AROM=85d. 12/03/20: AROM: Rt shoulder 90d    Baseline 45 passive flexoin 8/23: 120 degrees AAROM. 11/05/20: AROM-90d c decreased shoulder shrug. AAROM (shoulder ladder)-135d    Status Partially Met                   Plan - 12/12/20 1139     Clinical Impression Statement Pt participated in PT for R shoulder rotator cuf and posterior chain strengthening. Anterior serratus strengthening was completed today with pt noting weakness of the R vs. L. Pt's R shoulder flexion remains around 90d c shrugging present. Pt will continue to wrok on strengthening with also re-assessment of pt's HEP. If progress in strength and function are not imprved in the next 3 to visits, then anticipate DC from PT services.    Personal Factors and Comorbidities Fitness;Comorbidity 3+;Behavior Pattern    Comorbidities obesity, CAD, HTN, tachycardia, DM, HA    Examination-Activity Limitations Bathing;Carry;Dressing;Lift;Reach Overhead     Examination-Participation Restrictions Cleaning;Community Activity;Driving    Stability/Clinical Decision Making Stable/Uncomplicated    Clinical Decision Making Low    Rehab Potential Good    PT Frequency 1x / week    PT Duration 8 weeks    PT Treatment/Interventions ADLs/Self Care Home Management;Cryotherapy;Electrical Stimulation;Ultrasound;Moist Heat;Iontophoresis 44m/ml Dexamethasone;Gait training;Stair training;Functional mobility training;Therapeutic activities;Therapeutic exercise;Balance training;Patient/family education;Manual techniques;Dry needling;Passive range of motion;Taping;Vasopneumatic Device;Canalith Repostioning  PT Next Visit Plan Assess R shoulder pain status. Progress strengthening for the periscapular and rotator cuff as tolerated.    PT Home Exercise Plan [email or web address].    Consulted and Agree with Plan of Care Patient             Patient will benefit from skilled therapeutic intervention in order to improve the following deficits and impairments:  Decreased range of motion, Obesity, Decreased activity tolerance, Pain, Decreased strength, Decreased balance, Difficulty walking, Decreased knowledge of precautions, Impaired UE functional use  Visit Diagnosis: Right shoulder pain, unspecified chronicity  Decreased range of motion of left ankle  Muscle weakness (generalized)  Abnormal posture     Problem List Patient Active Problem List   Diagnosis Date Noted   Fatigue due to excessive exertion 08/13/2020   Complete tear of right rotator cuff    Subluxation of tendon of long head of biceps    Surgery, elective 05/09/2020   Gastrocnemius strain, left 03/23/2020   Venous incompetence 12/28/2019   Exertional dyspnea 11/08/2019   Chronic right shoulder pain 09/02/2019   Diabetic neuropathy ([place]) 08/16/2019   Chest pain of uncertain etiology 59/74/1638   CAD -S/P PCI 01/02/2019   Obesity (BMI 30-39.9) 01/02/2019   Orthostatic dizziness 01/02/2019    Hyperlipidemia LDL goal <70 12/25/2018   History of non-ST elevation myocardial infarction (NSTEMI) 12/23/2018   Type 2 diabetes mellitus with complication (HCC)    Tachycardia    Hypertension     Gar Ponto MS, PT 12/12/20 11:55 AM   [place] [facility] [address] [place], [place], [address] Phone: [phone]   Fax:  [phone]  Name: Keith Newton MRN: [id number] Date of Birth: 04/24/1955

## 2020-12-24 ENCOUNTER — Other Ambulatory Visit: Payer: Self-pay

## 2020-12-24 ENCOUNTER — Encounter: Payer: Self-pay | Admitting: Physical Therapy

## 2020-12-24 ENCOUNTER — Ambulatory Visit: Payer: 59 | Attending: Family Medicine | Admitting: Physical Therapy

## 2020-12-24 DIAGNOSIS — M25511 Pain in right shoulder: Secondary | ICD-10-CM | POA: Insufficient documentation

## 2020-12-24 DIAGNOSIS — M6281 Muscle weakness (generalized): Secondary | ICD-10-CM

## 2020-12-24 DIAGNOSIS — R293 Abnormal posture: Secondary | ICD-10-CM | POA: Diagnosis present

## 2020-12-24 DIAGNOSIS — M25672 Stiffness of left ankle, not elsewhere classified: Secondary | ICD-10-CM

## 2020-12-24 NOTE — Therapy (Signed)
[place], [place], [address] Phone: [phone]   Fax:  [phone]  Physical Therapy Treatment  Patient Details  Name: Keith Newton MRN: [id number] Date of Birth: Nov 24, 1955 Referring Provider (PT): Marlou Sa Tonna Corner, MD   Encounter Date: 12/24/2020   PT End of Session - 12/24/20 1032     Visit Number 26    Number of Visits 30    Date for PT Re-Evaluation 01/18/21    Authorization Type [employer]    Authorization - Number of Visits 30    Progress Note Due on Visit 30    PT Start Time 1000    PT Stop Time 1045    PT Time Calculation (min) 45 min    Activity Tolerance Patient tolerated treatment well;Patient limited by pain    Behavior During Therapy [facility] for tasks assessed/performed             Past Medical History:  Diagnosis Date   Arthritis    Back pain    CAD (coronary artery disease)    a. s/p NSTEMI in 12/2018 with 80% Prox-RCA stenosis followed by 100% prox to mid-RCA stenosis --> DESx2 to the RCA. Med management of residual disease.    Carpal tunnel syndrome    DDD (degenerative disc disease)    Diabetes mellitus    type 2   Hyperlipidemia    Hypertension    states very mild   Low testosterone    Myocardial infarction ([place])    Overweight    Seasonal allergies    Tachycardia     Past Surgical History:  Procedure Laterality Date   CARDIAC CATHETERIZATION     CARPAL TUNNEL RELEASE  07/02/2011   Procedure: CARPAL TUNNEL RELEASE;  Surgeon: Cammie Sickle., MD;  Location: [place];  Service: Orthopedics;  Laterality: Right;   CARPAL TUNNEL RELEASE  07/30/2011   Procedure: CARPAL TUNNEL RELEASE;  Surgeon: Cammie Sickle., MD;  Location: [place];  Service: Orthopedics;  Laterality: Left;   CORONARY ANGIOPLASTY     CORONARY STENT INTERVENTION N/A 12/23/2018   Procedure: CORONARY STENT INTERVENTION;  Surgeon: Jettie Booze, MD;   Location: [place] [facility];  Service: Cardiovascular;  Laterality: N/A;   LEFT HEART CATH AND CORONARY ANGIOGRAPHY N/A 12/23/2018   Procedure: LEFT HEART CATH AND CORONARY ANGIOGRAPHY;  Surgeon: Jettie Booze, MD;  Location: [place] [facility];  Service: Cardiovascular;  Laterality: N/A;   LUMBAR EPIDURAL INJECTION     x2   SHOULDER ARTHROSCOPY WITH OPEN ROTATOR CUFF REPAIR AND DISTAL CLAVICLE ACROMINECTOMY Right 06/03/2020   Procedure: right shoulder arthroscopy, biceps tenodesis, mini open rotator cuff tear repair subscap/supraspinatus, lateral debridement;  Surgeon: Meredith Pel, MD;  Location: [place];  Service: Orthopedics;  Laterality: Right;    There were no vitals filed for this visit.                              Therapeutic Exercise to improve strength of shoulder to reduce pain and improve function.  Exercises selected to maximize therapeutic benefit while minimizing discomfort:    - Row - 3x15 - Blue TB  - Shoulder Ext - 3x15 - Blue TB - IR with GTB - 3x10 - X arm stretch - 45'' d/c d/t pain - corner stretch below 90 degrees of abduction - 3x45'' - CC/CW ball circles to tolerance x2 -  X body adduction with forward trunk flexion 2x10  Manual therapy, concentrating on increasing extensibility of restricted tissue to reduce discomfort and improve mechanics in functional movement:  - Supine ER at 90 degrees of abduction full arc     Modalities:   Vasopneumatic (Game Ready)                Location:  right shoulder Time:  10 minutes Pressure:  medium Temperature:  38 degrees   Not completed this session: - UBE - 2.5'/2.5' - L2 -  while taking subjective - Serratus punch- 2x15 - Shoulder scaption - 2x10, no wieght- with upward scapular rotation assist - Tricep push down - 2x15 - RTB - rhythmic stabilization - 30''x3 - wall slide R shoulder flexion, 10x - S/L ER - 3# - 3x15 with assisted scapular retraction - Biceps curl - 3x10 @  6#     PT Short Term Goals - 09/26/20 2125       PT SHORT TERM GOAL #1   Title Keith Newton will be >75% HEP compliant within 3 weeks to facilitate carryover between sessions and move towards eventual independent management of their condition.    Baseline Started on eval    Status On-going    Target Date 11/02/20               PT Long Term Goals - 12/03/20 1250       PT LONG TERM GOAL #2   Title Keith Newton will demonstrate >130 degrees of active ROM, not limited by pain, in abduction and flexion to allow completion of activities involving reaching [facility]. 08/15/20: PROM= 145d, AAROM=130d, AROM=85d. 12/03/20: AROM: Rt shoulder 90d    Baseline 45 passive flexoin 8/23: 120 degrees AAROM. 11/05/20: AROM-90d c decreased shoulder shrug. AAROM (shoulder ladder)-135d    Status Partially Met                   Plan - 12/24/20 1038     Clinical Impression Statement Overall, Keith Newton is progressing fair with therapy.  Pt reports mild pain reduction following therapy.  Today we concentrated on rotator cuff strengthening and periscapular strengthening.  Pt continue to show higher than expected pain levels and weakness at this point.  We concentrated on some general mobility followed by strengthening today.  Pt tolerates this, but is limited in exercise intensity d/t pain.  With ER ROM he reports a "catch" near end range and around 45 degrees of ER.  Pt will continue to benefit from skilled physical therapy to address remaining deficits and achieve listed goals.  Continue per POC.    Personal Factors and Comorbidities Fitness;Comorbidity 3+;Behavior Pattern    Comorbidities obesity, CAD, HTN, tachycardia, DM, HA    Examination-Activity Limitations Bathing;Carry;Dressing;Lift;Reach Overhead    Examination-Participation Restrictions Cleaning;Community Activity;Driving    Stability/Clinical Decision Making Stable/Uncomplicated    Rehab Potential Good    PT Frequency 1x / week    PT  Duration 8 weeks    PT Treatment/Interventions ADLs/Self Care Home Management;Cryotherapy;Electrical Stimulation;Ultrasound;Moist Heat;Iontophoresis 70m/ml Dexamethasone;Gait training;Stair training;Functional mobility training;Therapeutic activities;Therapeutic exercise;Balance training;Patient/family education;Manual techniques;Dry needling;Passive range of motion;Taping;Vasopneumatic Device;Canalith Repostioning    PT Next Visit Plan Assess R shoulder pain status. Progress strengthening for the periscapular and rotator cuff as tolerated.    PT Home Exercise Plan [email or web address].    Consulted and Agree with Plan of Care Patient             Patient will benefit from skilled therapeutic intervention in order to  improve the following deficits and impairments:  Decreased range of motion, Obesity, Decreased activity tolerance, Pain, Decreased strength, Decreased balance, Difficulty walking, Decreased knowledge of precautions, Impaired UE functional use  Visit Diagnosis: Right shoulder pain, unspecified chronicity  Decreased range of motion of left ankle  Muscle weakness (generalized)  Abnormal posture     Problem List Patient Active Problem List   Diagnosis Date Noted   Fatigue due to excessive exertion 08/13/2020   Complete tear of right rotator cuff    Subluxation of tendon of long head of biceps    Surgery, elective 05/09/2020   Gastrocnemius strain, left 03/23/2020   Venous incompetence 12/28/2019   Exertional dyspnea 11/08/2019   Chronic right shoulder pain 09/02/2019   Diabetic neuropathy ([place]) 08/16/2019   Chest pain of uncertain etiology 81/77/1165   CAD -S/P PCI 01/02/2019   Obesity (BMI 30-39.9) 01/02/2019   Orthostatic dizziness 01/02/2019   Hyperlipidemia LDL goal <70 12/25/2018   History of non-ST elevation myocardial infarction (NSTEMI) 12/23/2018   Type 2 diabetes mellitus with complication (HCC)    Tachycardia    Hypertension     Mathis Dad,  PT 12/24/2020, 10:39 AM  [facility] [address] [place], [place], [address] Phone: [phone]   Fax:  [phone]  Name: Keith Newton MRN: [id number] Date of Birth: Aug 28, 1955

## 2021-01-01 ENCOUNTER — Other Ambulatory Visit: Payer: Self-pay

## 2021-01-01 ENCOUNTER — Ambulatory Visit: Payer: 59

## 2021-01-01 DIAGNOSIS — R293 Abnormal posture: Secondary | ICD-10-CM

## 2021-01-01 DIAGNOSIS — M25511 Pain in right shoulder: Secondary | ICD-10-CM

## 2021-01-01 DIAGNOSIS — M6281 Muscle weakness (generalized): Secondary | ICD-10-CM

## 2021-01-01 DIAGNOSIS — M25672 Stiffness of left ankle, not elsewhere classified: Secondary | ICD-10-CM

## 2021-01-01 MED ORDER — PREGABALIN 75 MG PO CAPS: 75.0000 mg | ORAL_CAPSULE | Freq: Three times a day (TID) | ORAL | 1 refills | Status: DC

## 2021-01-01 NOTE — Addendum Note (Signed)
Addended by: Steva Colder on: 01/01/2021 11:03 AM   Modules accepted: Orders

## 2021-01-01 NOTE — Therapy (Signed)
[place], [place], [address] Phone: [phone]   Fax:  [phone]  Physical Therapy Treatment  Patient Details  Name: Keith Newton MRN: [id number] Date of Birth: 10-21-1955 Referring Provider (PT): Marlou Sa Tonna Corner, MD   Encounter Date: 01/01/2021   PT End of Session - 01/02/21 2255     Visit Number 27    Number of Visits 30    Date for PT Re-Evaluation 01/18/21    Authorization Type [employer]    Authorization - Number of Visits 30    Progress Note Due on Visit 68    PT Start Time 1022    PT Stop Time 1120    PT Time Calculation (min) 58 min    Activity Tolerance Patient tolerated treatment well;Patient limited by pain    Behavior During Therapy [facility] for tasks assessed/performed             Past Medical History:  Diagnosis Date   Arthritis    Back pain    CAD (coronary artery disease)    a. s/p NSTEMI in 12/2018 with 80% Prox-RCA stenosis followed by 100% prox to mid-RCA stenosis --> DESx2 to the RCA. Med management of residual disease.    Carpal tunnel syndrome    DDD (degenerative disc disease)    Diabetes mellitus    type 2   Hyperlipidemia    Hypertension    states very mild   Low testosterone    Myocardial infarction ([place])    Overweight    Seasonal allergies    Tachycardia     Past Surgical History:  Procedure Laterality Date   CARDIAC CATHETERIZATION     CARPAL TUNNEL RELEASE  07/02/2011   Procedure: CARPAL TUNNEL RELEASE;  Surgeon: Cammie Sickle., MD;  Location: [place];  Service: Orthopedics;  Laterality: Right;   CARPAL TUNNEL RELEASE  07/30/2011   Procedure: CARPAL TUNNEL RELEASE;  Surgeon: Cammie Sickle., MD;  Location: [place];  Service: Orthopedics;  Laterality: Left;   CORONARY ANGIOPLASTY     CORONARY STENT INTERVENTION N/A 12/23/2018   Procedure: CORONARY STENT INTERVENTION;  Surgeon: Jettie Booze, MD;   Location: [place] [facility];  Service: Cardiovascular;  Laterality: N/A;   LEFT HEART CATH AND CORONARY ANGIOGRAPHY N/A 12/23/2018   Procedure: LEFT HEART CATH AND CORONARY ANGIOGRAPHY;  Surgeon: Jettie Booze, MD;  Location: [place] [facility];  Service: Cardiovascular;  Laterality: N/A;   LUMBAR EPIDURAL INJECTION     x2   SHOULDER ARTHROSCOPY WITH OPEN ROTATOR CUFF REPAIR AND DISTAL CLAVICLE ACROMINECTOMY Right 06/03/2020   Procedure: right shoulder arthroscopy, biceps tenodesis, mini open rotator cuff tear repair subscap/supraspinatus, lateral debridement;  Surgeon: Meredith Pel, MD;  Location: [place];  Service: Orthopedics;  Laterality: Right;    There were no vitals filed for this visit.   Subjective Assessment - 01/02/21 2321     Subjective Pt reports R triceps pain, but no shoulder pain    Pertinent History R/C repair (supraspinatus and subscap) with biceps tenodesis on 06/03/2020, heart attack 12/2020, DMT II    Limitations Lifting;Reading;Writing;House hold activities;Sitting    Patient Stated Goals get shoulder better    Currently in Pain? Yes    Pain Score 7     Pain Location Arm   triceps   Pain Orientation Posterior    Pain Descriptors / Indicators Aching    Pain Type Chronic pain    Pain  Onset More than a month ago    Pain Frequency Constant            Therapeutic Exercise to improve strength of shoulder to reduce pain and improve function.  Exercises selected to maximize therapeutic benefit while minimizing discomfort:   - UBE - 2.5'/2.5' - L2 -  while taking subjective - Row - 3x15 - Blue TB  - Shoulder Ext - 3x15 - Blue TB - IR with GTB - 3x10 - ER with RTB. Stopped after a few reps with pt reporting R shoulder pain - S/L ER - no weight - 3x10  - corner stretch below 90 degrees of abduction - 3x45'' - CC/CW ball circles to tolerance x2    Manual therapy, concentrating on increasing extensibility of restricted tissue to reduce discomfort and  improve mechanics in functional movement:   - Supine ER at 90 degrees of abduction full arc     Modalities: -Cold pack to the R shoulder for 110min      Not completed this session:  - Serratus punch- 2x15 - Shoulder scaption - 2x10, no wieght- with upward scapular rotation assist - Tricep push down - 2x15 - RTB - rhythmic stabilization - 30''x3 - wall slide R shoulder flexion, 10x - Biceps curl - 3x10 @ 6# - X arm stretch - 45'' d/c d/t pain - X body adduction with forward trunk flexion 2x10                               PT Short Term Goals - 09/26/20 2125       PT SHORT TERM GOAL #1   Title Keith Newton will be >75% HEP compliant within 3 weeks to facilitate carryover between sessions and move towards eventual independent management of their condition.    Baseline Started on eval    Status On-going    Target Date 11/02/20               PT Long Term Goals - 12/03/20 1250       PT LONG TERM GOAL #2   Title Keith Newton will demonstrate >130 degrees of active ROM, not limited by pain, in abduction and flexion to allow completion of activities involving reaching [facility]. 08/15/20: PROM= 145d, AAROM=130d, AROM=85d. 12/03/20: AROM: Rt shoulder 90d    Baseline 45 passive flexoin 8/23: 120 degrees AAROM. 11/05/20: AROM-90d c decreased shoulder shrug. AAROM (shoulder ladder)-135d    Status Partially Met                   Plan - 01/02/21 2256     Clinical Impression Statement PT was completed for rotator cuff and periscapular strengthening. Pt tolerance to R shoulder RC strengthening exs was limited. Standing ER c RTB was not tolerated and was stopped. Pt was able to tolerated R shoulder ER in S/L without resistance. Pt's continues to experience R shoulder pain and weakness limiting functional gains for r UE use.    Personal Factors and Comorbidities Fitness;Comorbidity 3+;Behavior Pattern    Comorbidities obesity, CAD, HTN, tachycardia, DM, HA     Examination-Activity Limitations Bathing;Carry;Dressing;Lift;Reach Overhead    Examination-Participation Restrictions Cleaning;Community Activity;Driving    Stability/Clinical Decision Making Stable/Uncomplicated    Clinical Decision Making Low    Rehab Potential Good    PT Frequency 1x / week    PT Duration 8 weeks    PT Treatment/Interventions ADLs/Self Care Home Management;Cryotherapy;Electrical Stimulation;Ultrasound;Moist Heat;Iontophoresis 4mg /ml Dexamethasone;Gait training;Stair training;Functional  mobility training;Therapeutic activities;Therapeutic exercise;Balance training;Patient/family education;Manual techniques;Dry needling;Passive range of motion;Taping;Vasopneumatic Device;Canalith Repostioning    PT Next Visit Plan Progress strengthening for the periscapular and rotator cuff as tolerated.    PT Home Exercise Plan [email or web address].    Consulted and Agree with Plan of Care Patient             Patient will benefit from skilled therapeutic intervention in order to improve the following deficits and impairments:  Decreased range of motion, Obesity, Decreased activity tolerance, Pain, Decreased strength, Decreased balance, Difficulty walking, Decreased knowledge of precautions, Impaired UE functional use  Visit Diagnosis: Right shoulder pain, unspecified chronicity  Decreased range of motion of left ankle  Muscle weakness (generalized)  Abnormal posture     Problem List Patient Active Problem List   Diagnosis Date Noted   Fatigue due to excessive exertion 08/13/2020   Complete tear of right rotator cuff    Subluxation of tendon of long head of biceps    Surgery, elective 05/09/2020   Gastrocnemius strain, left 03/23/2020   Venous incompetence 12/28/2019   Exertional dyspnea 11/08/2019   Chronic right shoulder pain 09/02/2019   Diabetic neuropathy ([place]) 08/16/2019   Chest pain of uncertain etiology 75/11/2583   CAD -S/P PCI 01/02/2019   Obesity (BMI 30-39.9)  01/02/2019   Orthostatic dizziness 01/02/2019   Hyperlipidemia LDL goal <70 12/25/2018   History of non-ST elevation myocardial infarction (NSTEMI) 12/23/2018   Type 2 diabetes mellitus with complication (HCC)    Tachycardia    Hypertension     Gar Ponto MS, PT 01/02/21 11:24 PM   [facility] [address] [place], [place], [address] Phone: [phone]   Fax:  [phone]  Name: Keith Newton MRN: [id number] Date of Birth: [birth year]

## 2021-01-01 NOTE — Telephone Encounter (Signed)
Spoke with pharmacy and insurance is rejecting Dr. Wynelle Link.   Will forward to morning preceptor.

## 2021-01-01 NOTE — Telephone Encounter (Signed)
Received fax from pharmacy: "Doctor's DEA needs to be updated - Please send new Rx." Sunday Spillers, CMA

## 2021-01-04 ENCOUNTER — Other Ambulatory Visit: Payer: Self-pay | Admitting: Cardiovascular Disease

## 2021-01-07 ENCOUNTER — Ambulatory Visit: Payer: 59

## 2021-01-07 ENCOUNTER — Other Ambulatory Visit: Payer: Self-pay

## 2021-01-07 DIAGNOSIS — R293 Abnormal posture: Secondary | ICD-10-CM

## 2021-01-07 DIAGNOSIS — M25511 Pain in right shoulder: Secondary | ICD-10-CM | POA: Diagnosis not present

## 2021-01-07 DIAGNOSIS — M6281 Muscle weakness (generalized): Secondary | ICD-10-CM

## 2021-01-07 NOTE — Therapy (Signed)
[place], [place], [address] Phone: [phone]   Fax:  [phone]  Physical Therapy Treatment  Patient Details  Name: Keith Newton MRN: [id number] Date of Birth: October 08, 1955 Referring Provider (PT): Marlou Sa Tonna Corner, MD   Encounter Date: 01/07/2021   PT End of Session - 01/07/21 1047     Visit Number 28    Number of Visits 30    Date for PT Re-Evaluation 01/18/21    Authorization Type [employer]    Authorization - Number of Visits 30    Progress Note Due on Visit 70    PT Start Time 1018    PT Stop Time 1115    PT Time Calculation (min) 57 min    Activity Tolerance Patient tolerated treatment well;Patient limited by pain    Behavior During Therapy [facility] for tasks assessed/performed             Past Medical History:  Diagnosis Date   Arthritis    Back pain    CAD (coronary artery disease)    a. s/p NSTEMI in 12/2018 with 80% Prox-RCA stenosis followed by 100% prox to mid-RCA stenosis --> DESx2 to the RCA. Med management of residual disease.    Carpal tunnel syndrome    DDD (degenerative disc disease)    Diabetes mellitus    type 2   Hyperlipidemia    Hypertension    states very mild   Low testosterone    Myocardial infarction ([place])    Overweight    Seasonal allergies    Tachycardia     Past Surgical History:  Procedure Laterality Date   CARDIAC CATHETERIZATION     CARPAL TUNNEL RELEASE  07/02/2011   Procedure: CARPAL TUNNEL RELEASE;  Surgeon: Cammie Sickle., MD;  Location: [place];  Service: Orthopedics;  Laterality: Right;   CARPAL TUNNEL RELEASE  07/30/2011   Procedure: CARPAL TUNNEL RELEASE;  Surgeon: Cammie Sickle., MD;  Location: [place];  Service: Orthopedics;  Laterality: Left;   CORONARY ANGIOPLASTY     CORONARY STENT INTERVENTION N/A 12/23/2018   Procedure: CORONARY STENT INTERVENTION;  Surgeon: Jettie Booze, MD;   Location: [place] [facility];  Service: Cardiovascular;  Laterality: N/A;   LEFT HEART CATH AND CORONARY ANGIOGRAPHY N/A 12/23/2018   Procedure: LEFT HEART CATH AND CORONARY ANGIOGRAPHY;  Surgeon: Jettie Booze, MD;  Location: [place] [facility];  Service: Cardiovascular;  Laterality: N/A;   LUMBAR EPIDURAL INJECTION     x2   SHOULDER ARTHROSCOPY WITH OPEN ROTATOR CUFF REPAIR AND DISTAL CLAVICLE ACROMINECTOMY Right 06/03/2020   Procedure: right shoulder arthroscopy, biceps tenodesis, mini open rotator cuff tear repair subscap/supraspinatus, lateral debridement;  Surgeon: Meredith Pel, MD;  Location: [place];  Service: Orthopedics;  Laterality: Right;    There were no vitals filed for this visit.   Subjective Assessment - 01/07/21 1024     Subjective Pt continues to reprts R triceps pain. Pt denies shoulder pain    Pertinent History R/C repair (supraspinatus and subscap) with biceps tenodesis on 06/03/2020, heart attack 12/2020, DMT II    Limitations Lifting;Reading;Writing;House hold activities;Sitting    Patient Stated Goals get shoulder better    Currently in Pain? Yes    Pain Score 6     Pain Location Arm   triceps   Pain Orientation Posterior    Pain Descriptors / Indicators Aching    Pain Type Chronic pain  Pain Onset More than a month ago    Pain Frequency Constant             Therapeutic Exercise to improve strength of shoulder to reduce pain and improve function.  Exercises selected to maximize therapeutic benefit while minimizing discomfort:   - UBE - 2.5'/2.5' - L2 -  while taking subjective - Row - 2x15 - green TB  - Shoulder Ext - 2x15 - green TB - IR with GTB - 3x10 - ER with YTB.  - corner stretch below 90 degrees of abduction - 3x45'' - CC/CW ball circles to tolerance 2x10     Manual therapy, concentrating on increasing extensibility of restricted tissue to reduce discomfort and improve mechanics in functional movement: - NA     Modalities: -  vasopneumatic 15 mins, 36dF, med presure, R shoulder     Not completed this session:   - Serratus punch- 2x15 - Shoulder scaption - 2x10, no wieght- with upward scapular rotation assist - Tricep push down - 2x15 - RTB - rhythmic stabilization - 30''x3 - wall slide R shoulder flexion, 10x - Biceps curl - 3x10 @ 6# - X arm stretch - 45'' d/c d/t pain - X body adduction with forward trunk flexion 2x10   [facility] PT Assessment - 01/07/21 0001       AROM   Right Shoulder Flexion 100 Degrees   without shrugging                                     PT Short Term Goals - 09/26/20 2125       PT SHORT TERM GOAL #1   Title [name] will be >75% HEP compliant within 3 weeks to facilitate carryover between sessions and move towards eventual independent management of their condition.    Baseline Started on eval    Status On-going    Target Date 11/02/20               PT Long Term Goals - 12/03/20 1250       PT LONG TERM GOAL #2   Title [name] will demonstrate >130 degrees of active ROM, not limited by pain, in abduction and flexion to allow completion of activities involving reaching [facility]. 08/15/20: PROM= 145d, AAROM=130d, AROM=85d. 12/03/20: AROM: Rt shoulder 90d    Baseline 45 passive flexoin 8/23: 120 degrees AAROM. 11/05/20: AROM-90d c decreased shoulder shrug. AAROM (shoulder ladder)-135d    Status Partially Met                   Plan - 01/07/21 1050     Clinical Impression Statement PT was continued for shoulder ROM and rotator cuff and periscapular strengthening. Pt tolerated R shoulder ER c yellow Tband better. Assessement of R shoulder flexion revealed min. increase in ROM with improved quality of shpulder girdle movement s shrugging to that ROM level. Vaso pneuatic was completed at the end of session for symptom management. Pt tolerated without adverse effects.    Personal Factors and Comorbidities Fitness;Comorbidity 3+;Behavior  Pattern    Comorbidities obesity, CAD, HTN, tachycardia, DM, HA    Examination-Activity Limitations Bathing;Carry;Dressing;Lift;Reach Overhead    Examination-Participation Restrictions Cleaning;Community Activity;Driving    Stability/Clinical Decision Making Stable/Uncomplicated    Clinical Decision Making Low    Rehab Potential Good    PT Frequency 1x / week    PT Duration 8 weeks    PT  Treatment/Interventions ADLs/Self Care Home Management;Cryotherapy;Electrical Stimulation;Ultrasound;Moist Heat;Iontophoresis 81m/ml Dexamethasone;Gait training;Stair training;Functional mobility training;Therapeutic activities;Therapeutic exercise;Balance training;Patient/family education;Manual techniques;Dry needling;Passive range of motion;Taping;Vasopneumatic Device;Canalith Repostioning    PT Next Visit Plan Progress strengthening for the periscapular and rotator cuff as tolerated. Develop final HEP over the next 2 visits.    PT Home Exercise Plan [email or web address].    Consulted and Agree with Plan of Care Patient             Patient will benefit from skilled therapeutic intervention in order to improve the following deficits and impairments:  Decreased range of motion, Obesity, Decreased activity tolerance, Pain, Decreased strength, Decreased balance, Difficulty walking, Decreased knowledge of precautions, Impaired UE functional use  Visit Diagnosis: Right shoulder pain, unspecified chronicity  Muscle weakness (generalized)  Muscle weakness  Abnormal posture     Problem List Patient Active Problem List   Diagnosis Date Noted   Fatigue due to excessive exertion 08/13/2020   Complete tear of right rotator cuff    Subluxation of tendon of long head of biceps    Surgery, elective 05/09/2020   Gastrocnemius strain, left 03/23/2020   Venous incompetence 12/28/2019   Exertional dyspnea 11/08/2019   Chronic right shoulder pain 09/02/2019   Diabetic neuropathy ([place] 08/16/2019   Chest pain of  uncertain etiology 080/02/2391  CAD -S/P PCI 01/02/2019   Obesity (BMI 30-39.9) 01/02/2019   Orthostatic dizziness 01/02/2019   Hyperlipidemia LDL goal <70 12/25/2018   History of non-ST elevation myocardial infarction (NSTEMI) 12/23/2018   Type 2 diabetes mellitus with complication (HCC)    Tachycardia    Hypertension     AGar PontoMS, PT 01/07/21 1:29 PM   [facility]GBea[address] [address]Phone: [phone]  Fax:  [phone] Name: Keith GeimanMRN: [id number] of Birth: 1Aug 28, 1957

## 2021-01-16 ENCOUNTER — Other Ambulatory Visit: Payer: Self-pay

## 2021-01-16 ENCOUNTER — Ambulatory Visit: Payer: Medicaid Other | Attending: Family Medicine

## 2021-01-16 DIAGNOSIS — M6281 Muscle weakness (generalized): Secondary | ICD-10-CM | POA: Insufficient documentation

## 2021-01-16 DIAGNOSIS — R262 Difficulty in walking, not elsewhere classified: Secondary | ICD-10-CM | POA: Insufficient documentation

## 2021-01-16 DIAGNOSIS — R293 Abnormal posture: Secondary | ICD-10-CM | POA: Insufficient documentation

## 2021-01-16 DIAGNOSIS — M25511 Pain in right shoulder: Secondary | ICD-10-CM | POA: Insufficient documentation

## 2021-01-16 DIAGNOSIS — M25672 Stiffness of left ankle, not elsewhere classified: Secondary | ICD-10-CM | POA: Insufficient documentation

## 2021-01-16 NOTE — Therapy (Signed)
[place], [place], [address] Phone: [phone]   Fax:  [phone]  Physical Therapy Treatment  Patient Details  Name: Keith Newton MRN: [id number] Date of Birth: 04/03/1955 Referring Provider (PT): Marlou Sa Tonna Corner, MD   Encounter Date: 01/16/2021   PT End of Session - 01/16/21 1056     Visit Number 29    Number of Visits 30    Date for PT Re-Evaluation 01/18/21    Authorization Type [employer]    Progress Note Due on Visit 30    PT Start Time 1020    PT Stop Time 1110    PT Time Calculation (min) 50 min    Activity Tolerance Patient tolerated treatment well;Patient limited by pain    Behavior During Therapy [facility] for tasks assessed/performed             Past Medical History:  Diagnosis Date   Arthritis    Back pain    CAD (coronary artery disease)    a. s/p NSTEMI in 12/2018 with 80% Prox-RCA stenosis followed by 100% prox to mid-RCA stenosis --> DESx2 to the RCA. Med management of residual disease.    Carpal tunnel syndrome    DDD (degenerative disc disease)    Diabetes mellitus    type 2   Hyperlipidemia    Hypertension    states very mild   Low testosterone    Myocardial infarction ([place])    Overweight    Seasonal allergies    Tachycardia     Past Surgical History:  Procedure Laterality Date   CARDIAC CATHETERIZATION     CARPAL TUNNEL RELEASE  07/02/2011   Procedure: CARPAL TUNNEL RELEASE;  Surgeon: Cammie Sickle., MD;  Location: [place];  Service: Orthopedics;  Laterality: Right;   CARPAL TUNNEL RELEASE  07/30/2011   Procedure: CARPAL TUNNEL RELEASE;  Surgeon: Cammie Sickle., MD;  Location: [place];  Service: Orthopedics;  Laterality: Left;   CORONARY ANGIOPLASTY     CORONARY STENT INTERVENTION N/A 12/23/2018   Procedure: CORONARY STENT INTERVENTION;  Surgeon: Jettie Booze, MD;  Location: [place] [facility];  Service:  Cardiovascular;  Laterality: N/A;   LEFT HEART CATH AND CORONARY ANGIOGRAPHY N/A 12/23/2018   Procedure: LEFT HEART CATH AND CORONARY ANGIOGRAPHY;  Surgeon: Jettie Booze, MD;  Location: [place] [facility];  Service: Cardiovascular;  Laterality: N/A;   LUMBAR EPIDURAL INJECTION     x2   SHOULDER ARTHROSCOPY WITH OPEN ROTATOR CUFF REPAIR AND DISTAL CLAVICLE ACROMINECTOMY Right 06/03/2020   Procedure: right shoulder arthroscopy, biceps tenodesis, mini open rotator cuff tear repair subscap/supraspinatus, lateral debridement;  Surgeon: Meredith Pel, MD;  Location: [place];  Service: Orthopedics;  Laterality: Right;    There were no vitals filed for this visit.   Subjective Assessment - 01/17/21 0746     Subjective Pt reports his R trcips pain is the same, sometimes less or more. The pain woke him up last night.    Pertinent History R/C repair (supraspinatus and subscap) with biceps tenodesis on 06/03/2020, heart attack 12/2020, DMT II    Limitations Lifting;Reading;Writing;House hold activities;Sitting    Patient Stated Goals get shoulder better    Currently in Pain? Yes    Pain Score 4     Pain Location Arm    Pain Orientation Posterior;Right    Pain Descriptors / Indicators Aching    Pain Type Chronic pain    Pain  Onset More than a month ago    Pain Frequency Constant    Aggravating Factors  Movement, sleeping positions, pt reports rainy weather    Pain Relieving Factors Ice pack, rest, tylenol    Effect of Pain on Daily Activities anything involving lifting, reaching, lifting, writing               Therapeutic Exercise to improve strength of shoulder to reduce pain and improve function.  Exercises selected to maximize therapeutic benefit while minimizing discomfort:   - UBE - 2.0'/2.0' - L2 -  while taking subjective - Shoulder ladder x5, 10" - Wall slides for fl shoulder fexion 2x10 - Row - 2x15 - green TB  - Shoulder Ext - 2x15 - green TB - IR with GTB - 3x10 -  ER with YTB - 2x10 - corner stretch below 90 degrees of abduction - 3x45'' - CC/CW ball circles to tolerance 2x10     Manual therapy, concentrating on increasing extensibility of restricted tissue to reduce discomfort and improve mechanics in functional movement: - NA     Modalities: - vasopneumatic 15 mins, 36dF, med pressure, R shoulder     Not completed this session: - Serratus punch- 2x15 - Shoulder scaption - 2x10, no wieght- with upward scapular rotation assist - Tricep push down - 2x15 - RTB - rhythmic stabilization - 30''x3 - Biceps curl - 3x10 @ 6# - X arm stretch - 45'' d/c d/t pain - X body adduction with forward trunk flexion 2x10                           PT Education - 01/17/21 0809     Education Details Discussed which exs to continue at home for HEP    Person(s) Educated Patient    Methods Explanation    Comprehension Verbalized understanding              PT Short Term Goals - 09/26/20 2125       PT SHORT TERM GOAL #1   Title [name] will be >75% HEP compliant within 3 weeks to facilitate carryover between sessions and move towards eventual independent management of their condition.    Baseline Started on eval    Status On-going    Target Date 11/02/20               PT Long Term Goals - 12/03/20 1250       PT LONG TERM GOAL #2   Title [name] will demonstrate >130 degrees of active ROM, not limited by pain, in abduction and flexion to allow completion of activities involving reaching [facility]. 08/15/20: PROM= 145d, AAROM=130d, AROM=85d. 12/03/20: AROM: Rt shoulder 90d    Baseline 45 passive flexoin 8/23: 120 degrees AAROM. 11/05/20: AROM-90d c decreased shoulder shrug. AAROM (shoulder ladder)-135d    Status Partially Met                   Plan - 01/17/21 0757     Clinical Impression Statement PT was completd to address R shoulder RTC and periscapular strengthening and ROM. During course of completeing  ther ex, discussed which exs he should continue with his HEP. Pt voice understanding. Will provide final written HEP after next session. Use of R shoulder continues to be limited by pain and weakness. Pt tolerated the session without adverse effects. Vasopneumatic was provided after ther ex for symptom management.    Personal Factors and Comorbidities Fitness;Comorbidity 3+;Behavior  Pattern    Comorbidities obesity, CAD, HTN, tachycardia, DM, HA    Examination-Activity Limitations Bathing;Carry;Dressing;Lift;Reach Overhead    Examination-Participation Restrictions Cleaning;Community Activity;Driving    Stability/Clinical Decision Making Stable/Uncomplicated    Clinical Decision Making Low    Rehab Potential Good    PT Frequency 1x / week    PT Treatment/Interventions ADLs/Self Care Home Management;Cryotherapy;Electrical Stimulation;Ultrasound;Moist Heat;Iontophoresis 7m/ml Dexamethasone;Gait training;Stair training;Functional mobility training;Therapeutic activities;Therapeutic exercise;Balance training;Patient/family education;Manual techniques;Dry needling;Passive range of motion;Taping;Vasopneumatic Device;Canalith Repostioning    PT Next Visit Plan Review final HEP.    PT Home Exercise Plan [email or web address].    Consulted and Agree with Plan of Care Patient             Patient will benefit from skilled therapeutic intervention in order to improve the following deficits and impairments:  Decreased range of motion, Obesity, Decreased activity tolerance, Pain, Decreased strength, Decreased balance, Difficulty walking, Decreased knowledge of precautions, Impaired UE functional use  Visit Diagnosis: Right shoulder pain, unspecified chronicity  Muscle weakness (generalized)  Abnormal posture  Decreased range of motion of left ankle     Problem List Patient Active Problem List   Diagnosis Date Noted   Fatigue due to excessive exertion 08/13/2020   Complete tear of right rotator cuff     Subluxation of tendon of long head of biceps    Surgery, elective 05/09/2020   Gastrocnemius strain, left 03/23/2020   Venous incompetence 12/28/2019   Exertional dyspnea 11/08/2019   Chronic right shoulder pain 09/02/2019   Diabetic neuropathy ([place] 08/16/2019   Chest pain of uncertain etiology 036/14/4315  CAD -S/P PCI 01/02/2019   Obesity (BMI 30-39.9) 01/02/2019   Orthostatic dizziness 01/02/2019   Hyperlipidemia LDL goal <70 12/25/2018   History of non-ST elevation myocardial infarction (NSTEMI) 12/23/2018   Type 2 diabetes mellitus with complication (HCC)    Tachycardia    Hypertension     AGar PontoMS, PT 01/17/21 8:23 AM   CWhitfieldCPac[facility]GHol[address] [place] [address] [address]Phone: [phone]  Fax:  [phone] Name: GGarcia DalzellMRN: [id number] of Birth: 106-Mar-1957

## 2021-01-29 ENCOUNTER — Encounter: Payer: Self-pay | Admitting: Orthopedic Surgery

## 2021-01-29 ENCOUNTER — Ambulatory Visit (INDEPENDENT_AMBULATORY_CARE_PROVIDER_SITE_OTHER): Payer: 59 | Admitting: Surgical

## 2021-01-29 ENCOUNTER — Ambulatory Visit: Payer: Medicaid Other

## 2021-01-29 ENCOUNTER — Other Ambulatory Visit: Payer: Self-pay

## 2021-01-29 DIAGNOSIS — M25672 Stiffness of left ankle, not elsewhere classified: Secondary | ICD-10-CM

## 2021-01-29 DIAGNOSIS — M6281 Muscle weakness (generalized): Secondary | ICD-10-CM

## 2021-01-29 DIAGNOSIS — R262 Difficulty in walking, not elsewhere classified: Secondary | ICD-10-CM

## 2021-01-29 DIAGNOSIS — M19011 Primary osteoarthritis, right shoulder: Secondary | ICD-10-CM

## 2021-01-29 DIAGNOSIS — R293 Abnormal posture: Secondary | ICD-10-CM

## 2021-01-29 DIAGNOSIS — M25511 Pain in right shoulder: Secondary | ICD-10-CM

## 2021-01-29 NOTE — Progress Notes (Signed)
Office Visit Note   Patient: Keith Newton           Date of Birth: 28-May-1955           MRN: [id number] Visit Date: 01/29/2021 Requested by: Littie Deeds, MD [address] [facility],  [place] [address] PCP: Littie Deeds, MD  Subjective: Chief Complaint  Patient presents with   Right Shoulder - Routine Post Op     06/03/20 (49m 26d) right shoulder arthroscopy, biceps tenodesis, mini open rotator cuff tear repair subscap/supraspinatus, lateral debridement      HPI: Keith Newton is a 65 y.o. male who presents to the office complaining of right shoulder pain s/p right shoulder biceps tenodesis and mini open rotator cuff tear repair of subscapularis and supraspinatus tear.  Patient is here for 83-month follow-up.  He localizes most of the current pain he is experiencing to the anterior and mid humerus regions of the shoulder as well as the posterior aspect of the elbow where he occasionally notices radiation to this area.  He denies any radiation of pain past the elbow.  No recent falls or injuries.  No numbness or tingling, neck pain, scapular pain but he does note occasional trapezius pain in the right-hand side.  Feels the right side is not as strong as his left side.  His main complaint is pain rather than weakness.  He does have history of diabetes with last A1c 9.9.  He feels more functional with the arm than he did prior to surgery but still is concerned about the lingering pain..                ROS: All systems reviewed are negative as they relate to the chief complaint within the history of present illness.  Patient denies fevers or chills.  Assessment & Plan: Visit Diagnoses:  1. Glenohumeral arthritis, right     Plan: Patient is a 65 year old male who presents s/p right shoulder rotator cuff tear repair on 06/03/2020.  He is here for repeat visit.  He has had some improvement in function over the last 4 months since his last visit with Dr. August Saucer but feels his pain is not really  significantly improved with main complaint of mid humerus pain with radiation to the posterior elbow.  With decent rotator cuff strength on exam today but pain with passive motion of the shoulder as well as decreased range of motion, impression is likely continued pain from existing glenohumeral arthritis which was demonstrated on previous radiographs as well as on arthroscopy pictures from time of procedure.  He has moderate glenohumeral arthritis and seems he has some possible contribution from the acromioclavicular joint on exam today.  Discussed options available to patient and states he may have to find a balance between being too active with this shoulder versus not active enough with the shoulder.  He would also benefit likely from a glenohumeral injection but with his most recent A1c of 9.9 this is contraindicated.  Recommended he follow-up with the office in the future if his A1c drops below 8 and we can try an injection which would be both diagnostic and therapeutic into the glenohumeral joint.  Patient agreed with this plan.  Follow-up as needed.  Follow-Up Instructions: No follow-ups on file.   Orders:  No orders of the defined types were placed in this encounter.  No orders of the defined types were placed in this encounter.     Procedures: No procedures performed   Clinical Data: No additional  findings.  Objective: Vital Signs: There were no vitals taken for this visit.  Physical Exam:  Constitutional: Patient appears well-developed HEENT:  Head: Normocephalic Eyes:EOM are normal Neck: Normal range of motion Cardiovascular: Normal rate Pulmonary/chest: Effort normal Neurologic: Patient is alert Skin: Skin is warm Psychiatric: Patient has normal mood and affect  Ortho Exam: Ortho exam demonstrates right shoulder with 50 degrees external rotation, 70 degrees abduction, 130 degrees forward flexion.  This compared with the left shoulder with 70 degrees external rotation,  120 degrees abduction, 180 degrees forward flexion.  Incisions are well-healed from prior surgery.  Minimal tenderness over the bicipital groove of the right shoulder.  Moderate tenderness over the [facility] joint of the right shoulder.  Pain with passive motion of the right shoulder.  There is a very subtle clicking sensation noted in the anterior lateral aspect of the shoulder with passive external rotation while the arm is AB ducted.  5/5 motor strength of infraspinatus and subscapularis.  5 -/5 strength of supraspinatus.  Specialty Comments:  No specialty comments available.  Imaging: No results found.   PMFS History: Patient Active Problem List   Diagnosis Date Noted   Fatigue due to excessive exertion 08/13/2020   Complete tear of right rotator cuff    Subluxation of tendon of long head of biceps    Surgery, elective 05/09/2020   Gastrocnemius strain, left 03/23/2020   Venous incompetence 12/28/2019   Exertional dyspnea 11/08/2019   Chronic right shoulder pain 09/02/2019   Diabetic neuropathy (HCC) 08/16/2019   Chest pain of uncertain etiology 04/27/2019   CAD -S/P PCI 01/02/2019   Obesity (BMI 30-39.9) 01/02/2019   Orthostatic dizziness 01/02/2019   Hyperlipidemia LDL goal <70 12/25/2018   History of non-ST elevation myocardial infarction (NSTEMI) 12/23/2018   Type 2 diabetes mellitus with complication (HCC)    Tachycardia    Hypertension    Past Medical History:  Diagnosis Date   Arthritis    Back pain    CAD (coronary artery disease)    a. s/p NSTEMI in 12/2018 with 80% Prox-RCA stenosis followed by 100% prox to mid-RCA stenosis --> DESx2 to the RCA. Med management of residual disease.    Carpal tunnel syndrome    DDD (degenerative disc disease)    Diabetes mellitus    type 2   Hyperlipidemia    Hypertension    states very mild   Low testosterone    Myocardial infarction (HCC)    Overweight    Seasonal allergies    Tachycardia     Family History  Problem Relation  Age of Onset   Stroke Mother    CAD Father    CAD Maternal Grandfather    CAD Paternal Grandfather     Past Surgical History:  Procedure Laterality Date   CARDIAC CATHETERIZATION     CARPAL TUNNEL RELEASE  07/02/2011   Procedure: CARPAL TUNNEL RELEASE;  Surgeon: Wyn Forster., MD;  Location: [facility];  Service: Orthopedics;  Laterality: Right;   CARPAL TUNNEL RELEASE  07/30/2011   Procedure: CARPAL TUNNEL RELEASE;  Surgeon: Wyn Forster., MD;  Location: [facility];  Service: Orthopedics;  Laterality: Left;   CORONARY ANGIOPLASTY     CORONARY STENT INTERVENTION N/A 12/23/2018   Procedure: CORONARY STENT INTERVENTION;  Surgeon: Corky Crafts, MD;  Location: [facility] INVASIVE CV LAB;  Service: Cardiovascular;  Laterality: N/A;   LEFT HEART CATH AND CORONARY ANGIOGRAPHY N/A 12/23/2018   Procedure: LEFT HEART CATH  AND CORONARY ANGIOGRAPHY;  Surgeon: Corky Crafts, MD;  Location: [facility] INVASIVE CV LAB;  Service: Cardiovascular;  Laterality: N/A;   LUMBAR EPIDURAL INJECTION     x2   SHOULDER ARTHROSCOPY WITH OPEN ROTATOR CUFF REPAIR AND DISTAL CLAVICLE ACROMINECTOMY Right 06/03/2020   Procedure: right shoulder arthroscopy, biceps tenodesis, mini open rotator cuff tear repair subscap/supraspinatus, lateral debridement;  Surgeon: Cammy Copa, MD;  Location: [facility];  Service: Orthopedics;  Laterality: Right;   Social History   Occupational History   Occupation: retired  Tobacco Use   Smoking status: Never   Smokeless tobacco: Never  Vaping Use   Vaping Use: Never used  Substance and Sexual Activity   Alcohol use: No   Drug use: No   Sexual activity: Not on file

## 2021-01-30 NOTE — Therapy (Addendum)
[place], [place], [address] Phone: [phone]   Fax:  [phone]  Physical Therapy Treatment/Re-Cert/Discharge/Progress Note  Patient Details  Name: Keith Newton MRN: [id number] Date of Birth: 10-19-1955 Referring Provider (PT): Marlou Sa Tonna Corner, MD  Progress Note Reporting Period 10/10/20 to 01/29/21  See note below for Objective Data and Assessment of Progress/Goals.       Encounter Date: 01/29/2021   PT End of Session - 01/29/21 1258     Visit Number 30    Number of Visits 30    Date for PT Re-Evaluation 01/29/21    Authorization Type [employer]    Authorization - Number of Visits 30    Progress Note Due on Visit 57    PT Start Time 1154    PT Stop Time 1244    PT Time Calculation (min) 50 min    Activity Tolerance Patient tolerated treatment well;Patient limited by pain    Behavior During Therapy [facility] for tasks assessed/performed             Past Medical History:  Diagnosis Date   Arthritis    Back pain    CAD (coronary artery disease)    a. s/p NSTEMI in 12/2018 with 80% Prox-RCA stenosis followed by 100% prox to mid-RCA stenosis --> DESx2 to the RCA. Med management of residual disease.    Carpal tunnel syndrome    DDD (degenerative disc disease)    Diabetes mellitus    type 2   Hyperlipidemia    Hypertension    states very mild   Low testosterone    Myocardial infarction ([place])    Overweight    Seasonal allergies    Tachycardia     Past Surgical History:  Procedure Laterality Date   CARDIAC CATHETERIZATION     CARPAL TUNNEL RELEASE  07/02/2011   Procedure: CARPAL TUNNEL RELEASE;  Surgeon: Cammie Sickle., MD;  Location: [place];  Service: Orthopedics;  Laterality: Right;   CARPAL TUNNEL RELEASE  07/30/2011   Procedure: CARPAL TUNNEL RELEASE;  Surgeon: Cammie Sickle., MD;  Location: [place];  Service: Orthopedics;   Laterality: Left;   CORONARY ANGIOPLASTY     CORONARY STENT INTERVENTION N/A 12/23/2018   Procedure: CORONARY STENT INTERVENTION;  Surgeon: Jettie Booze, MD;  Location: [place] CV LAB;  Service: Cardiovascular;  Laterality: N/A;   LEFT HEART CATH AND CORONARY ANGIOGRAPHY N/A 12/23/2018   Procedure: LEFT HEART CATH AND CORONARY ANGIOGRAPHY;  Surgeon: Jettie Booze, MD;  Location: [place] CV LAB;  Service: Cardiovascular;  Laterality: N/A;   LUMBAR EPIDURAL INJECTION     x2   SHOULDER ARTHROSCOPY WITH OPEN ROTATOR CUFF REPAIR AND DISTAL CLAVICLE ACROMINECTOMY Right 06/03/2020   Procedure: right shoulder arthroscopy, biceps tenodesis, mini open rotator cuff tear repair subscap/supraspinatus, lateral debridement;  Surgeon: Meredith Pel, MD;  Location: [place];  Service: Orthopedics;  Laterality: Right;    There were no vitals filed for this visit.   Subjective Assessment - 01/30/21 0705     Subjective Pt reports seeing Dr. Marlou Sa earlier this AM and he may receive an injection if the pain continues. Pt stated Dr. Marlou Sa talked about arthritis of his R shoulder. Pt reports his R shoulder pain and function are better, but the pain level is not where he wants it to be.    Pertinent History R/C repair (supraspinatus and subscap) with biceps tenodesis on 06/03/2020, heart attack  12/2020, DMT II    Currently in Pain? Yes    Pain Score 5     Pain Location Shoulder    Pain Orientation Right;Posterior    Pain Descriptors / Indicators Aching    Pain Type Chronic pain    Pain Onset More than a month ago    Pain Frequency Intermittent    Aggravating Factors  Movement, sleeping positions, pt reports rainy weather    Pain Relieving Factors Ice pack, rest, tylenol    Effect of Pain on Daily Activities anything involving lifting, reaching               Therapeutic Exercise to improve strength of shoulder to reduce pain and improve function.  Exercises selected to maximize  therapeutic benefit while minimizing discomfort:   - UBE - 2.0'/2.0' - L2 -  while taking subjective - Shoulder ladder x 5, 10" - Wall slides for flexion shoulder fexion x10 - Row - 2x15 - green TB  - Shoulder Ext - 2x15 - green TB - IR with RTB - 2x10 - ER with YTB - 2x10 - Bicep curl, RTB, 2x10     Manual therapy, concentrating on increasing extensibility of restricted tissue to reduce discomfort and improve mechanics in functional movement: - NA     Modalities: - vasopneumatic 15 mins, 36dF, med pressure, R shoulder                            PT Education - 01/30/21 0711     Education Details Final HEP    Person(s) Educated Patient    Methods Explanation;Demonstration;Tactile cues;Verbal cues;Handout    Comprehension Verbalized understanding;Returned demonstration;Verbal cues required;Tactile cues required              PT Short Term Goals - 09/26/20 2125       PT SHORT TERM GOAL #1   Title Keith Newton will be >75% HEP compliant within 3 weeks to facilitate carryover between sessions and move towards eventual independent management of their condition.    Baseline Started on eval    Status On-going    Target Date 11/02/20               PT Long Term Goals - 01/30/21 0722       PT LONG TERM GOAL #1   Title Keith Newton will achieve >=60 degrees of shoulder ER at 90 degrees to allow proper shoulder mechanics during OH movement    Baseline 10 at neutral 8/18: met    Status Achieved    Target Date 11/02/20      PT LONG TERM GOAL #2   Title Keith Newton will demonstrate >130 degrees of active ROM, not limited by pain, in abduction and flexion to allow completion of activities involving reaching OH. 08/15/20: PROM= 145d, AAROM=130d, AROM=85d. 12/03/20: AROM: Rt shoulder 90d.01/29/21: AROM 100d, AAROM 140d    Baseline 45 passive flexoin 8/23: 120 degrees AAROM. 11/05/20: AROM-90d c decreased shoulder shrug. AAROM (shoulder ladder)-135d     Status Partially Met    Target Date 01/29/21      PT LONG TERM GOAL #3   Title Keith Newton will be able to reach [facility] into cabinet, not limited by pain. 01/29/21: Pt is able to reach [facility] into a cabinet, but with pain    Baseline unable  8/23: Able to first shelf, but painful. 11/05/20: 1st shelf no problem, 2nd shelve but painful    Status Not Met  Target Date 01/29/21      PT LONG TERM GOAL #4   Title Keith Newton will be able to resume driving, not limited by pain    Baseline 8/23: MET    Status Achieved    Target Date 11/02/20      PT LONG TERM GOAL #5   Title Keith Newton will be compliant with post op precautions throughout therapy.    Baseline non compliant    Status Not Met    Target Date 01/29/21      PT LONG TERM GOAL #6   Title Keith Newton will improve FOTO score from 30 to 66 as a proxy for functional improvement; 01/29/21= 54% continued improvement over the course of PT.    Baseline 49%    Target Date 01/29/21                   Plan - 01/30/21 1478     Clinical Impression Statement PT was completed today for R shoulder ROM and strengthening and to finalize pt's HEP. Pt has utizized the available PT sessions per ins. for this calender year. PT has been focused on improving pt's R shoulder pain, strength, and ROM following RTC repair. Pt has made fair progress re: pain, ROM and function, but limtations remain with pain being the most significant to pt. With the functional ability FOTO assessment, pt's score improved from 30% to 54%. Pt is ind in a HEP to maintain or progress achieved LOF. Pt is going to f/u c Dr. Marlou Sa re: pain relief with a possible injection as per pt's report.    Personal Factors and Comorbidities Fitness;Comorbidity 3+;Behavior Pattern    Comorbidities obesity, CAD, HTN, tachycardia, DM, HA    Examination-Activity Limitations Bathing;Carry;Dressing;Lift;Reach Overhead    Examination-Participation Restrictions Cleaning;Community Activity;Driving     Stability/Clinical Decision Making Stable/Uncomplicated    Clinical Decision Making Low    Rehab Potential Good    PT Frequency 1x / week    PT Duration 8 weeks    PT Treatment/Interventions ADLs/Self Care Home Management;Cryotherapy;Electrical Stimulation;Ultrasound;Moist Heat;Iontophoresis 49m/ml Dexamethasone;Gait training;Stair training;Functional mobility training;Therapeutic activities;Therapeutic exercise;Balance training;Patient/family education;Manual techniques;Dry needling;Passive range of motion;Taping;Vasopneumatic Device;Canalith Repostioning    PT Home Exercise Plan [email or web address].             Patient will benefit from skilled therapeutic intervention in order to improve the following deficits and impairments:  Decreased range of motion, Obesity, Decreased activity tolerance, Pain, Decreased strength, Decreased balance, Difficulty walking, Decreased knowledge of precautions, Impaired UE functional use  Visit Diagnosis: Right shoulder pain, unspecified chronicity  Muscle weakness (generalized)  Abnormal posture  Decreased range of motion of left ankle  Muscle weakness  Difficulty in walking, not elsewhere classified     Problem List Patient Active Problem List   Diagnosis Date Noted   Fatigue due to excessive exertion 08/13/2020   Complete tear of right rotator cuff    Subluxation of tendon of long head of biceps    Surgery, elective 05/09/2020   Gastrocnemius strain, left 03/23/2020   Venous incompetence 12/28/2019   Exertional dyspnea 11/08/2019   Chronic right shoulder pain 09/02/2019   Diabetic neuropathy [place] 08/16/2019   Chest pain of uncertain etiology 029/56/2130  CAD -S/P PCI 01/02/2019   Obesity (BMI 30-39.9) 01/02/2019   Orthostatic dizziness 01/02/2019   Hyperlipidemia LDL goal <70 12/25/2018   History of non-ST elevation myocardial infarction (NSTEMI) 12/23/2018   Type 2 diabetes mellitus with complication (HCC)    Tachycardia  Hypertension     PHYSICAL THERAPY DISCHARGE SUMMARY  Visits from Start of Care: 30  Current functional level related to goals / functional outcomes: See above   Remaining deficits: See above   Education / Equipment: HEP   Patient agrees to discharge. Patient goals were partially met. Patient is being discharged due to  plateau in progress.   [place], [place], [address] Phone: [phone]   Fax:  [phone]  Name: Keith Newton MRN: [id number] Date of Birth: [birth year]

## 2021-01-30 NOTE — Addendum Note (Signed)
Addended by: Joellyn Rued on: 01/30/2021 04:25 PM   Modules accepted: Orders

## 2021-02-03 ENCOUNTER — Other Ambulatory Visit: Payer: Self-pay | Admitting: Family Medicine

## 2021-02-13 ENCOUNTER — Ambulatory Visit: Payer: 59 | Admitting: Family Medicine

## 2021-02-13 NOTE — Progress Notes (Deleted)
° ° °  SUBJECTIVE:   CHIEF COMPLAINT / HPI: f/u DM, fatigue  T2DM Medications include metformin 1000 mg twice daily, empagliflozin 10 mg, and Trulicity 0.75 mg weekly, Tresiba 40 units daily Not well controlled at last visit, no medication changes were made as he was often forgetting to take his insulin.  Fatigue  PERTINENT  PMH / PSH: CAD, T2DM, HTN, HLD  OBJECTIVE:   There were no vitals taken for this visit.  General: ***, NAD CV: RRR, no murmurs*** Pulm: CTAB, no wheezes or rales  Wt Readings from Last 3 Encounters:  12/11/20 252 lb (114.3 kg)  11/05/20 249 lb 9.6 oz (113.2 kg)  08/13/20 242 lb 12.8 oz (110.1 kg)     ASSESSMENT/PLAN:   No problem-specific Assessment & Plan notes found for this encounter.     Littie Deeds, MD [facility]   {    This will disappear when note is signed, click to select method of visit    :1}

## 2021-02-13 NOTE — Patient Instructions (Incomplete)
It was nice seeing you today! ° ° ° °Please arrive at least 15 minutes prior to your scheduled appointments. ° °Stay well, °Clydette Privitera, MD °[facility] °[phone]  °

## 2021-02-21 ENCOUNTER — Ambulatory Visit (INDEPENDENT_AMBULATORY_CARE_PROVIDER_SITE_OTHER): Payer: Self-pay | Admitting: Family Medicine

## 2021-02-21 ENCOUNTER — Encounter: Payer: Self-pay | Admitting: Family Medicine

## 2021-02-21 ENCOUNTER — Other Ambulatory Visit: Payer: Self-pay

## 2021-02-21 VITALS — BP 124/77 | HR 96 | Ht 68.0 in | Wt 246.8 lb

## 2021-02-21 DIAGNOSIS — E785 Hyperlipidemia, unspecified: Secondary | ICD-10-CM

## 2021-02-21 DIAGNOSIS — Z6837 Body mass index (BMI) 37.0-37.9, adult: Secondary | ICD-10-CM

## 2021-02-21 DIAGNOSIS — E1142 Type 2 diabetes mellitus with diabetic polyneuropathy: Secondary | ICD-10-CM

## 2021-02-21 DIAGNOSIS — E1169 Type 2 diabetes mellitus with other specified complication: Secondary | ICD-10-CM

## 2021-02-21 DIAGNOSIS — Z125 Encounter for screening for malignant neoplasm of prostate: Secondary | ICD-10-CM

## 2021-02-21 DIAGNOSIS — E118 Type 2 diabetes mellitus with unspecified complications: Secondary | ICD-10-CM

## 2021-02-21 LAB — POCT GLYCOSYLATED HEMOGLOBIN (HGB A1C): HbA1c, POC (controlled diabetic range): 8.8 % — AB (ref 0.0–7.0)

## 2021-02-21 MED ORDER — TRULICITY 1.5 MG/0.5ML ~~LOC~~ SOAJ: 1.5000 mg | SUBCUTANEOUS | 0 refills | Status: DC

## 2021-02-21 MED ORDER — PREGABALIN 75 MG PO CAPS: 75.0000 mg | ORAL_CAPSULE | Freq: Three times a day (TID) | ORAL | 1 refills | Status: DC

## 2021-02-21 NOTE — Patient Instructions (Addendum)
It was nice seeing you today!  Increasing Trulicity (once weekly injection) to higher dose 1.5 mg.  Work on increasing exercise and healthy eating.  Blood work today. Will send you a letter in the mail if everything looks okay.  Follow-up in 3 months.  Stay well, Littie Deeds, MD [facility] [phone]

## 2021-02-21 NOTE — Progress Notes (Signed)
° ° °  SUBJECTIVE:   CHIEF COMPLAINT / HPI:   T2DM Medications include metformin 1000 mg twice daily, empagliflozin 10 mg, and Trulicity 0.75 mg weekly, Tresiba 40 units daily Not well controlled at last visit, no medication changes were made as he was often forgetting to take his insulin. He has not been checking his sugars.  Reports good adherence with his medications, states he has forgotten to take the Trulicity 2 times since last visit.  He has been taking the insulin every day. Asking for refill for pregabalin for neuropathy.  He primarily takes this at night when pain bothers him and will usually take 2 pills, and sometimes will take an additional 2 pills in the middle of the night.  Does not usually take any during the day.  He has had some dizziness but no falls.  Patient expresses concern about prostate cancer as his father, two uncles have had prostate cancer. His father died from complications of CHF. Mother died from breast cancer.  Denies urinary symptoms.  He was also asking about medications for erectile dysfunction.  PERTINENT  PMH / PSH: CAD, T2DM, HTN, HLD  OBJECTIVE:   BP 124/77    Pulse 96    Ht 5\' 8"  (1.727 m)    Wt 246 lb 12.8 oz (111.9 kg)    SpO2 98%    BMI 37.53 kg/m   General: Obese older male, NAD CV: RRR, no murmurs Pulm: CTAB, no wheezes or rales  Wt Readings from Last 3 Encounters:  02/21/21 246 lb 12.8 oz (111.9 kg)  12/11/20 252 lb (114.3 kg)  11/05/20 249 lb 9.6 oz (113.2 kg)     ASSESSMENT/PLAN:   Type 2 diabetes mellitus with complication (HCC) Not at goal but improving with better adherence to medication regimen.  Increase Trulicity to 1.5 mg weekly, continue other medications at current dose.  Advised to monitor fasting sugars.  He was unable able to provide urine sample for urine microalbumin.  May consider starting low-dose ACE or ARB in the future.  Hyperlipidemia associated with type 2 diabetes mellitus (HCC), LDL goal < 70 We will check  LDL given recent addition of ezetimibe.  He reports compliance with both statin and ezetimibe.  Diabetic neuropathy (HCC) Pregabalin refilled, advised cautious use of this medication.   Screening for prostate cancer Shared decision making with patient, he opted for blood testing given concern with family history of prostate cancer. - PSA  I advised against use of medication to treat erectile dysfunction given his cardiac history and concomitant use of nitrates.  I encouraged a healthy diet and increasing aerobic exercise.   11/07/20, MD [facility]

## 2021-02-22 ENCOUNTER — Encounter: Payer: Self-pay | Admitting: Family Medicine

## 2021-02-22 LAB — PSA: Prostate Specific Ag, Serum: 1.6 ng/mL (ref 0.0–4.0)

## 2021-02-22 LAB — LDL CHOLESTEROL, DIRECT: LDL Direct: 58 mg/dL (ref 0–99)

## 2021-02-22 NOTE — Assessment & Plan Note (Signed)
Pregabalin refilled, advised cautious use of this medication.

## 2021-02-22 NOTE — Assessment & Plan Note (Signed)
Not at goal but improving with better adherence to medication regimen.  Increase Trulicity to 1.5 mg weekly, continue other medications at current dose.  Advised to monitor fasting sugars.  He was unable able to provide urine sample for urine microalbumin.  May consider starting low-dose ACE or ARB in the future.

## 2021-02-22 NOTE — Assessment & Plan Note (Signed)
We will check LDL given recent addition of ezetimibe.  He reports compliance with both statin and ezetimibe.

## 2021-03-12 NOTE — Progress Notes (Deleted)
Cardiology Office Note:    Date:  03/12/2021   ID:  Keith Newton, DOB [birth year], MRN [id number]  PCP:  Littie Deeds, MD [facility] Cardiologist: Nicki Guadalajara, MD   Reason for visit: 74-month follow-up  History of Present Illness:    Keith Newton is a 66 y.o. male with a hx of diabetes, obesity, hypertension, CAD status post stenting to the RCA in 2020.  He was last seen in March 2022 for preop clearance for shoulder surgery.  He has chronic chest pain and takes Imdur and metoprolol titrate for anti-anginal therapy.''  In October 2022 when he saw me, we discussed increasing activity, improving diet and rechecking lipids in 3 months.  -Consider decreasing Brilinta to 60 mg twice daily.  Would consider DC Brilinta when risk factors better controlled.  Today, ***  CAD, no angina - s/p stenting to RCA; residual 75% stenosis in RPDA (small vessel-tx medically), 50% stenosis in mid circumflex and 40% stenosis in LAD. -Continue Imdur and metoprolol titrate for antianginal therapy. -Encourage diabetic control and weight loss. -Will discuss with Dr. Tresa Endo if patient should decrease his Brilinta dose from 90 mg to 60 mg twice daily for long-term prevention.   Diabetes, uncontrolled -Hemoglobin A1c last month was 9.9%.  Discussed goal A1c around 7% for secondary risk prevention. -Management per PCP.   Hypertension, reasonably controlled -Continue current medications. -Goal BP is <130/80.  Recommend DASH diet (high in vegetables, fruits, low-fat dairy products, whole grains, poultry, fish, and nuts and low in sweets, sugar-sweetened beverages, and red meats), salt restriction and increase physical activity.   Hyperlipidemia, uncontrolled -LDL 104 in September 2022.  Currently on Lipitor 80 mg and Zetia 10 mg daily.  LDL previously 55 in January 2021.  Recommend taking medications regularly and will recheck lipids in 3 months.  If LDL is still over 70, recommend PCSK9 inhibitors (pt  already takes another injectable - insulin). -Discussed cholesterol lowering diets - Mediterranean diet, DASH diet, vegetarian diet, low-carbohydrate diet and avoidance of trans fats.  Discussed healthier choice substitutes.  Nuts, high-fiber foods, and fiber supplements may also improve lipids.     Obesity -Discussed how even a 5-10% weight loss can have cardiovascular benefits.   -Recommend moderate intensity activity for 30 minutes 5 days/week and the DASH diet.   Disposition - Follow-up in 3 months to follow-up risk factor mitigation.  Recheck lipids at that time.      Past Medical History:  Diagnosis Date   Arthritis    Back pain    CAD (coronary artery disease)    a. s/p NSTEMI in 12/2018 with 80% Prox-RCA stenosis followed by 100% prox to mid-RCA stenosis --> DESx2 to the RCA. Med management of residual disease.    Carpal tunnel syndrome    DDD (degenerative disc disease)    Diabetes mellitus    type 2   Hyperlipidemia    Hypertension    states very mild   Low testosterone    Myocardial infarction (HCC)    Overweight    Seasonal allergies    Tachycardia     Past Surgical History:  Procedure Laterality Date   CARDIAC CATHETERIZATION     CARPAL TUNNEL RELEASE  07/02/2011   Procedure: CARPAL TUNNEL RELEASE;  Surgeon: Wyn Forster., MD;  Location: [facility];  Service: Orthopedics;  Laterality: Right;   CARPAL TUNNEL RELEASE  07/30/2011   Procedure: CARPAL TUNNEL RELEASE;  Surgeon: Wyn Forster., MD;  Location: [facility];  Service: Orthopedics;  Laterality: Left;   CORONARY ANGIOPLASTY     CORONARY STENT INTERVENTION N/A 12/23/2018   Procedure: CORONARY STENT INTERVENTION;  Surgeon: Corky Crafts, MD;  Location: [facility] INVASIVE CV LAB;  Service: Cardiovascular;  Laterality: N/A;   LEFT HEART CATH AND CORONARY ANGIOGRAPHY N/A 12/23/2018   Procedure: LEFT HEART CATH AND CORONARY ANGIOGRAPHY;  Surgeon: Corky Crafts, MD;   Location: [facility] INVASIVE CV LAB;  Service: Cardiovascular;  Laterality: N/A;   LUMBAR EPIDURAL INJECTION     x2   SHOULDER ARTHROSCOPY WITH OPEN ROTATOR CUFF REPAIR AND DISTAL CLAVICLE ACROMINECTOMY Right 06/03/2020   Procedure: right shoulder arthroscopy, biceps tenodesis, mini open rotator cuff tear repair subscap/supraspinatus, lateral debridement;  Surgeon: Cammy Copa, MD;  Location: [facility];  Service: Orthopedics;  Laterality: Right;    Current Medications: No outpatient medications have been marked as taking for the 03/13/21 encounter (Appointment) with Cannon Kettle, PA-C.     Allergies:   Codeine   Social History   Socioeconomic History   Marital status: Married    Spouse name: Ahron Hulbert   Number of children: 2   Years of education: 16   Highest education level: Bachelor's degree (e.g., BA, AB, BS)  Occupational History   Occupation: retired  Tobacco Use   Smoking status: Never   Smokeless tobacco: Never  Vaping Use   Vaping Use: Never used  Substance and Sexual Activity   Alcohol use: No   Drug use: No   Sexual activity: Not on file  Other Topics Concern   Not on file  Social History Narrative   Not on file   Social Determinants of Health   Financial Resource Strain: Not on file  Food Insecurity: Not on file  Transportation Needs: Not on file  Physical Activity: Not on file  Stress: Not on file  Social Connections: Not on file     Family History: The patient's family history includes CAD in his father, maternal grandfather, and paternal grandfather; Stroke in his mother.  ROS:   Please see the history of present illness.     EKGs/Labs/Other Studies Reviewed:    EKG:  The ekg ordered today demonstrates ***  Recent Labs: 06/03/2020: BUN 16; Creatinine, Ser 1.03; Potassium 4.5; Sodium 139 08/13/2020: Hemoglobin 15.1; Platelets 330; TSH 1.490   Recent Lipid Panel Lab Results  Component Value Date/Time   CHOL 194 11/05/2020 03:45 PM   TRIG  289 (H) 11/05/2020 03:45 PM   HDL 41 11/05/2020 03:45 PM   LDLCALC 104 (H) 11/05/2020 03:45 PM   LDLDIRECT 58 02/21/2021 10:07 AM    Physical Exam:    VS:  There were no vitals taken for this visit.   No data found.  Wt Readings from Last 3 Encounters:  02/21/21 246 lb 12.8 oz (111.9 kg)  12/11/20 252 lb (114.3 kg)  11/05/20 249 lb 9.6 oz (113.2 kg)     GEN: *** Well nourished, well developed in no acute distress HEENT: Normal NECK: No JVD; No carotid bruits CARDIAC: ***RRR, no murmurs, rubs, gallops RESPIRATORY:  Clear to auscultation without rales, wheezing or rhonchi  ABDOMEN: Soft, non-tender, non-distended MUSCULOSKELETAL: No edema; No deformity  SKIN: Warm and dry NEUROLOGIC:  Alert and oriented PSYCHIATRIC:  Normal affect     ASSESSMENT AND PLAN   ***   {Are you ordering a CV Procedure (e.g. stress test, cath, DCCV, TEE, etc)?   Press F2        :[id number]}  Medication Adjustments/Labs and Tests Ordered: Current medicines are reviewed at length with the patient today.  Concerns regarding medicines are outlined above.  No orders of the defined types were placed in this encounter.  No orders of the defined types were placed in this encounter.   There are no Patient Instructions on file for this visit.   Signed, Cannon KettleJennifer K Rhyann Berton, PA-C  03/12/2021 3:19 PM    [facility]

## 2021-03-13 ENCOUNTER — Ambulatory Visit: Payer: 59 | Admitting: Physician Assistant

## 2021-03-15 ENCOUNTER — Other Ambulatory Visit: Payer: Self-pay | Admitting: Student

## 2021-04-21 NOTE — Progress Notes (Deleted)
? ? ?Office Visit  ?  ?Patient Name: Keith Newton ?Date of Encounter: 04/21/2021 ? ?Primary Care Provider:  Sun, Richard, MD ?Primary Cardiologist:  Thomas Kelly, MD ? ?Chief Complaint  ?  ?65-year-old male with a history of CAD s/p DES-RCA in 2020, chronic chest pain, hypertension, hyperlipidemia, type 2 diabetes, and obesity who presents for follow-up related to CAD. ? ?Past Medical History  ?  ?Past Medical History:  ?Diagnosis Date  ? Arthritis   ? Back pain   ? CAD (coronary artery disease)   ? a. s/p NSTEMI in 12/2018 with 80% Prox-RCA stenosis followed by 100% prox to mid-RCA stenosis --> DESx2 to the RCA. Med management of residual disease.   ? Carpal tunnel syndrome   ? DDD (degenerative disc disease)   ? Diabetes mellitus   ? type 2  ? Hyperlipidemia   ? Hypertension   ? states very mild  ? Low testosterone   ? Myocardial infarction (HCC)   ? Overweight   ? Seasonal allergies   ? Tachycardia   ? ?Past Surgical History:  ?Procedure Laterality Date  ? CARDIAC CATHETERIZATION    ? CARPAL TUNNEL RELEASE  07/02/2011  ? Procedure: CARPAL TUNNEL RELEASE;  Surgeon: Robert V Sypher Jr., MD;  Location: [facility];  Service: Orthopedics;  Laterality: Right;  ? CARPAL TUNNEL RELEASE  07/30/2011  ? Procedure: CARPAL TUNNEL RELEASE;  Surgeon: Robert V Sypher Jr., MD;  Location: [facility];  Service: Orthopedics;  Laterality: Left;  ? CORONARY ANGIOPLASTY    ? CORONARY STENT INTERVENTION N/A 12/23/2018  ? Procedure: CORONARY STENT INTERVENTION;  Surgeon: Varanasi, Jayadeep S, MD;  Location: [facility] INVASIVE CV LAB;  Service: Cardiovascular;  Laterality: N/A;  ? LEFT HEART CATH AND CORONARY ANGIOGRAPHY N/A 12/23/2018  ? Procedure: LEFT HEART CATH AND CORONARY ANGIOGRAPHY;  Surgeon: Varanasi, Jayadeep S, MD;  Location: [facility];  Service: Cardiovascular;  Laterality: N/A;  ? LUMBAR EPIDURAL INJECTION    ? x2  ? SHOULDER ARTHROSCOPY WITH OPEN ROTATOR CUFF REPAIR AND DISTAL CLAVICLE  ACROMINECTOMY Right 06/03/2020  ? Procedure: right shoulder arthroscopy, biceps tenodesis, mini open rotator cuff tear repair subscap/supraspinatus, lateral debridement;  Surgeon: Dean, Gregory Scott, MD;  Location: [facility];  Service: Orthopedics;  Laterality: Right;  ? ? ?Allergies ? ?Allergies  ?Allergen Reactions  ? Codeine Swelling  ?  Lips and facial swelling  ? ? ?History of Present Illness  ?  ?65-year-old male with the above past medical history including CAD s/p DES-p-mRCA in 2020, chronic chest pain, hypertension, hyperlipidemia, type 2 diabetes, and obesity. ? ?Cardiac catheterization in November 2020 showed p-mRCA 80-100% stenosis s/p DES x2 overlapping, RPDA 75%, mCX 50%, m-dLAD 25-40%, EF 55-65%.  Cardiogram at the time showed EF 55 to 60%, moderately increased LVH, basal inferior hypokinesis.  Lower extremity duplex in the setting of leg pain were normal.  Low risk Myoview in 2021 and again in 2022 in the setting of chest tightness and preoperative cardiac evaluation. ?Seen in the office on 12/11/2020 and was stable overall from a cardiac standpoint. ? ?He presents today for follow-up.  Since his last visit ? ?CAD/chronic chest pain: ?Hypertension: ?Hyperlipidemia: ?Type 2 diabetes: ?Obesity: ?Disposition: ? ?Home Medications  ?  ?Current Outpatient Medications  ?Medication Sig Dispense Refill  ? acetaminophen (TYLENOL) 325 MG tablet Take 2 tablets (650 mg total) by mouth every 6 (six) hours as needed.    ? aspirin EC 81 MG EC tablet Take 1 tablet (  81 mg total) by mouth daily.    ? atorvastatin (LIPITOR) 80 MG tablet TAKE 1 TABLET BY MOUTH EVERY DAY 90 tablet 3  ? Blood Glucose Monitoring Suppl (ONETOUCH VERIO IQ SYSTEM) w/Device KIT Check fasting blood glucose in AM and before giving insulin. 1 kit 1  ? dapagliflozin propanediol (FARXIGA) 10 MG TABS tablet Take 1 tablet (10 mg total) by mouth daily. 90 tablet 3  ? Dulaglutide (TRULICITY) 1.5 MG/0.5ML SOPN Inject 1.5 mg into the skin once a week. 2 mL 0   ? Elastic Bandages & Supports (MEDICAL COMPRESSION STOCKINGS) MISC Wear daily 2 each 1  ? ezetimibe (ZETIA) 10 MG tablet Take 1 tablet (10 mg total) by mouth daily. 90 tablet 3  ? glucose blood (ONETOUCH VERIO) test strip Use as instructed 100 each 12  ? insulin degludec (TRESIBA FLEXTOUCH) 100 UNIT/ML FlexTouch Pen Inject 40 Units into the skin daily. 15 mL 4  ? isosorbide mononitrate (IMDUR) 60 MG 24 hr tablet TAKE 1 TABLET BY MOUTH EVERY DAY 90 tablet 3  ? Lancet Devices (ONE TOUCH DELICA LANCING DEV) MISC Check fasting blood glucose in AM and before giving insulin. 1 each 11  ? metFORMIN (GLUCOPHAGE) 1000 MG tablet TAKE 1 TABLET BY MOUTH TWICE A DAY 180 tablet 1  ? metoprolol tartrate (LOPRESSOR) 25 MG tablet Take 3 tablets (75 mg total) by mouth 2 (two) times daily. 180 tablet 5  ? nitroGLYCERIN (NITROSTAT) 0.4 MG SL tablet Place 1 tablet (0.4 mg total) under the tongue every 5 (five) minutes x 3 doses as needed for chest pain. 25 tablet 3  ? NOVOFINE PLUS PEN NEEDLE 32G X 4 MM MISC USE WITH TRESIBA DAILY 100 each 6  ? OneTouch Delica Lancets 33G MISC Check fasting blood glucose in AM and before giving insulin. 100 each 11  ? pregabalin (LYRICA) 75 MG capsule Take 1 capsule (75 mg total) by mouth 3 (three) times daily. 90 capsule 1  ? ticagrelor (BRILINTA) 90 MG TABS tablet Take 1 tablet (90 mg total) by mouth 2 (two) times daily. 180 tablet 3  ? ?No current facility-administered medications for this visit.  ?  ? ?Review of Systems  ?  ?***.  All other systems reviewed and are otherwise negative except as noted above. ?  ? ?Physical Exam  ?  ?VS:  There were no vitals taken for this visit. , BMI There is no height or weight on file to calculate BMI. ?    ?GEN: Well nourished, well developed, in no acute distress. ?HEENT: normal. ?Neck: Supple, no JVD, carotid bruits, or masses. ?Cardiac: RRR, no murmurs, rubs, or gallops. No clubbing, cyanosis, edema.  Radials/DP/PT 2+ and equal bilaterally.  ?Respiratory:   Respirations regular and unlabored, clear to auscultation bilaterally. ?GI: Soft, nontender, nondistended, BS + x 4. ?MS: no deformity or atrophy. ?Skin: warm and dry, no rash. ?Neuro:  Strength and sensation are intact. ?Psych: Normal affect. ? ?Accessory Clinical Findings  ?  ?ECG personally reviewed by me today - *** - no acute changes. ? ?Lab Results  ?Component Value Date  ? WBC 6.3 08/13/2020  ? HGB 15.1 08/13/2020  ? HCT 46.9 08/13/2020  ? MCV 85 08/13/2020  ? PLT 330 08/13/2020  ? ?Lab Results  ?Component Value Date  ? CREATININE 1.03 06/03/2020  ? BUN 16 06/03/2020  ? NA 139 06/03/2020  ? K 4.5 06/03/2020  ? CL 108 06/03/2020  ? CO2 24 06/03/2020  ? ?Lab Results  ?Component Value Date  ?   ALT 23 03/01/2019  ? AST 18 03/01/2019  ? ALKPHOS 84 03/01/2019  ? BILITOT 0.8 03/01/2019  ? ?Lab Results  ?Component Value Date  ? CHOL 194 11/05/2020  ? HDL 41 11/05/2020  ? LDLCALC 104 (H) 11/05/2020  ? LDLDIRECT 58 02/21/2021  ? TRIG 289 (H) 11/05/2020  ? CHOLHDL 4.7 11/05/2020  ?  ?Lab Results  ?Component Value Date  ? HGBA1C 8.8 (A) 02/21/2021  ? ? ?Assessment & Plan  ?  ?1.  *** ? ? ?Emily C Monge, NP ?04/21/2021, 1:31 PM ?  ? ?

## 2021-04-22 ENCOUNTER — Ambulatory Visit: Payer: Medicaid Other | Admitting: Nurse Practitioner

## 2021-05-23 ENCOUNTER — Encounter: Payer: Self-pay | Admitting: *Deleted

## 2021-05-23 DIAGNOSIS — Z006 Encounter for examination for normal comparison and control in clinical research program: Secondary | ICD-10-CM

## 2021-05-23 NOTE — Patient Instructions (Signed)
Voicemail left for Keith Newton to inform him of the essence research study. Encouraged him to call us back. ?

## 2021-06-19 NOTE — Patient Instructions (Incomplete)
It was nice seeing you today!  Blood work today.  See me in 3 months or whenever is a good for you.  Stay well, Greggory Safranek, MD [facility] [phone]  --  Make sure to check out at the front desk before you leave today.  Please arrive at least 15 minutes prior to your scheduled appointments.  If you had blood work today, I will send you a MyChart message or a letter if results are normal. Otherwise, I will give you a call.  If you had a referral placed, they will call you to set up an appointment. Please give us a call if you don't hear back in the next 2 weeks.  If you need additional refills before your next appointment, please call your pharmacy first.  

## 2021-06-19 NOTE — Progress Notes (Deleted)
    SUBJECTIVE:   CHIEF COMPLAINT / HPI:  No chief complaint on file.   T2DM Medications include metformin 1000 mg twice daily, empagliflozin 10 mg, and Trulicity 1.5 mg weekly, Tresiba 40 units daily.***  PERTINENT  PMH / PSH: CAD, T2DM, HTN, HLD  Patient Care Team: Littie Deeds, MD as PCP - General (Family Medicine) Lennette Bihari, MD as PCP - Cardiology (Cardiology)   OBJECTIVE:   There were no vitals taken for this visit.  Physical Exam      06/25/2020    9:24 AM  Depression screen PHQ 2/9  Decreased Interest 1  Down, Depressed, Hopeless 1  PHQ - 2 Score 2  Altered sleeping 1  Tired, decreased energy 1  Change in appetite 1  Feeling bad or failure about yourself  1  Trouble concentrating 0  Moving slowly or fidgety/restless 0  Suicidal thoughts 0  PHQ-9 Score 6     {Show previous vital signs (optional):23777}  {Labs  Heme  Chem  Endocrine  Serology  Results Review (optional):23779}  ASSESSMENT/PLAN:   No problem-specific Assessment & Plan notes found for this encounter.    No follow-ups on file.   Littie Deeds, MD [facility]

## 2021-06-20 ENCOUNTER — Ambulatory Visit: Payer: Medicaid Other | Admitting: Family Medicine

## 2021-06-20 NOTE — Progress Notes (Signed)
?Cardiology Office Note:   ? ?Date:  06/26/2021  ? ?ID:  Keith ChrisGrady Zill, DOB 03-26-1955, MRN [id number] ? ?PCP:  Littie DeedsSun, Richard, MD ?  ?[facility] Providers ?Cardiologist:  Nicki Guadalajarahomas Kelly, MD  ?Referring MD: Littie DeedsSun, Richard, MD  ? ?Chief Complaint  ?Patient presents with  ? Chest Pain  ?  Pressure on top of chest stopped on left side of chest comes and goes   ? Follow-up  ? ? ?History of Present Illness:   ? ?Keith Newton is a 66 y.o. male with a hx of CAD s/p DES-RCA 2020, HLD, DM, hypertension, obesity, and chronic chest pain.  Antianginals include metoprolol and Imdur.  Nuclear stress test 11/2019 obtained for chest pain was nonischemic.  Repeat nuclear stress test for preoperative risk assessment for shoulder surgery obtained in 04/2020 that showed prior MI and peri-infarct ischemia.  LVEF estimated 30 to 44%.  ? ?He saw Juanda CrumbleJennifer Lambert, PA-C in clinic 12/11/2020.  LDL was greater than 70 recently with PCP and PCSK9 I was offered.  Patient opted to take medications more regularly and recheck in 3 months.  Shoulder surgery was completed 05/2020 without cardiac complications. ? ?He called our office with complaints of chest pain.  He did not want a call back but wants to evaluate this during today's visit. ? ?Upon entering the room, he asks where his male doctor is. He describes a chest pain across his chest. He can't tell me how often it happens. CP lasts less than 5 minutes. He has not taken nitro. CP occurs with sitting still, while driving, or walking. He doesn't know how long this has been going on. He is only here because his daughter wanted him seen (daughter in nursing school). He also stopped taking all of his medications 2-3 months ago because he didn't feel well. He can't tell me the timing of CP onset after stopping medications, but he hasn't had chest pain in the last 2 weeks.  Overall, history is difficult, pt is not forthcoming with information or details.  ? ?Cardiac regimen piror to  self-discontinuation: ?81 mg ASA ?90 mg brilinta BID ?80 mg lipitor ?10 mg zetia ?60 mg imdur ?75 mg metoprolol BID ? ? ?Past Medical History:  ?Diagnosis Date  ? Arthritis   ? Back pain   ? CAD (coronary artery disease)   ? a. s/p NSTEMI in 12/2018 with 80% Prox-RCA stenosis followed by 100% prox to mid-RCA stenosis --> DESx2 to the RCA. Med management of residual disease.   ? Carpal tunnel syndrome   ? DDD (degenerative disc disease)   ? Diabetes mellitus   ? type 2  ? Hyperlipidemia   ? Hypertension   ? states very mild  ? Low testosterone   ? Myocardial infarction [facility]HCC)   ? Overweight   ? Seasonal allergies   ? Tachycardia   ? ? ?Past Surgical History:  ?Procedure Laterality Date  ? CARDIAC CATHETERIZATION    ? CARPAL TUNNEL RELEASE  07/02/2011  ? Procedure: CARPAL TUNNEL RELEASE;  Surgeon: Wyn Forsterobert V Sypher Jr., MD;  Location: [facility];  Service: Orthopedics;  Laterality: Right;  ? CARPAL TUNNEL RELEASE  07/30/2011  ? Procedure: CARPAL TUNNEL RELEASE;  Surgeon: Wyn Forsterobert V Sypher Jr., MD;  Location: [facility];  Service: Orthopedics;  Laterality: Left;  ? CORONARY ANGIOPLASTY    ? CORONARY STENT INTERVENTION N/A 12/23/2018  ? Procedure: CORONARY STENT INTERVENTION;  Surgeon: Corky CraftsVaranasi, Jayadeep S, MD;  Location: [facility];  Service: Cardiovascular;  Laterality: N/A;  ? LEFT HEART CATH AND CORONARY ANGIOGRAPHY N/A 12/23/2018  ? Procedure: LEFT HEART CATH AND CORONARY ANGIOGRAPHY;  Surgeon: Corky Crafts, MD;  Location: [facility] INVASIVE CV LAB;  Service: Cardiovascular;  Laterality: N/A;  ? LUMBAR EPIDURAL INJECTION    ? x2  ? SHOULDER ARTHROSCOPY WITH OPEN ROTATOR CUFF REPAIR AND DISTAL CLAVICLE ACROMINECTOMY Right 06/03/2020  ? Procedure: right shoulder arthroscopy, biceps tenodesis, mini open rotator cuff tear repair subscap/supraspinatus, lateral debridement;  Surgeon: Cammy Copa, MD;  Location: [facility] OR;  Service: Orthopedics;  Laterality: Right;  ? ? ?Current  Medications: ?No outpatient medications have been marked as taking for the 06/26/21 encounter (Office Visit) with Marcelino Duster, PA.  ?  ? ?Allergies:   Codeine  ? ?Social History  ? ?Socioeconomic History  ? Marital status: Married  ?  Spouse name: Semaj Coburn  ? Number of children: 2  ? Years of education: 12  ? Highest education level: Bachelor's degree (e.g., BA, AB, BS)  ?Occupational History  ? Occupation: retired  ?Tobacco Use  ? Smoking status: Never  ? Smokeless tobacco: Never  ?Vaping Use  ? Vaping Use: Never used  ?Substance and Sexual Activity  ? Alcohol use: No  ? Drug use: No  ? Sexual activity: Not on file  ?Other Topics Concern  ? Not on file  ?Social History Narrative  ? Not on file  ? ?Social Determinants of Health  ? ?Financial Resource Strain: Not on file  ?Food Insecurity: Not on file  ?Transportation Needs: Not on file  ?Physical Activity: Not on file  ?Stress: Not on file  ?Social Connections: Not on file  ?  ? ?Family History: ?The patient's family history includes CAD in his father, maternal grandfather, and paternal grandfather; Stroke in his mother. ? ?ROS:   ?Please see the history of present illness.    ? All other systems reviewed and are negative. ? ?EKGs/Labs/Other Studies Reviewed:   ? ?The following studies were reviewed today: ? ?LHC 2020: ?Prox - mid RCA-2 lesion is 100% stenosed. This was the culprit lesion. ?A drug-eluting stent was successfully placed using a STENT SYNERGY DES [id number]. ?Post intervention, there is a 0% residual stenosis. ?Prox RCA-1 lesion is 80% stenosed. ?A drug-eluting stent was successfully placed using a STENT SYNERGY DES [id number], overlapping the first stent. ?Post intervention, there is a 0% residual stenosis. ?RPDA lesion is 75% stenosed. This was a small vessel. Medical treatment planned. ?Mid Cx lesion is 50% stenosed. ?Mid LAD lesion is 40% stenosed. ?Mid LAD to Dist LAD lesion is 25% stenosed. ?The left ventricular systolic function is normal. ?LV  end diastolic pressure is normal. ?The left ventricular ejection fraction is 55-65% by visual estimate. ?There is no aortic valve stenosis. ?Tortuosity in right subclavian did not allow stiffer Cordis XBRCA catheter to pass into ascending aorta. Medtronic Launcher catheter was able to pass from right radial approach. ?  ?Watch in 2H.  Continue aggressive secondary prevention.  He needs weight loss and DM control, high dose statin and beta blocker.  ?  ?He is interested in learning more about exercise and healthy diet.  Recommend cardiac rehab.  ?  ?DAPT for 1 year given ACS.   ? ?EKG:  EKG is  ordered today.  The ekg ordered today demonstrates sinus rhythm iwht HR 95, old inferior infarct, old anterior infarct ? ?Recent Labs: ?08/13/2020: Hemoglobin 15.1; Platelets 330; TSH 1.490  ?Recent Lipid Panel ?   ?Component  Value Date/Time  ? CHOL 194 11/05/2020 1545  ? TRIG 289 (H) 11/05/2020 1545  ? HDL 41 11/05/2020 1545  ? CHOLHDL 4.7 11/05/2020 1545  ? CHOLHDL 6.6 12/24/2018 0214  ? VLDL 25 12/24/2018 0214  ? LDLCALC 104 (H) 11/05/2020 1545  ? LDLDIRECT 58 02/21/2021 1007  ? ? ? ?Risk Assessment/Calculations:   ?  ? ?    ? ?Physical Exam:   ? ?VS:  BP 128/86   Pulse 95   Ht 5\' 8"  (1.727 m)   Wt 247 lb 6.4 oz (112.2 kg)   SpO2 97%   BMI 37.62 kg/m?    ? ?Wt Readings from Last 3 Encounters:  ?06/26/21 247 lb 6.4 oz (112.2 kg)  ?02/21/21 246 lb 12.8 oz (111.9 kg)  ?12/11/20 252 lb (114.3 kg)  ?  ? ?GEN:  Well nourished, well developed in no acute distress ?HEENT: Normal ?NECK: No JVD; No carotid bruits ?LYMPHATICS: No lymphadenopathy ?CARDIAC: RRR, no murmurs, rubs, gallops ?RESPIRATORY:  Clear to auscultation without rales, wheezing or rhonchi  ?ABDOMEN: Soft, non-tender, non-distended ?MUSCULOSKELETAL:  No edema; No deformity  ?SKIN: Warm and dry ?NEUROLOGIC:  Alert and oriented x 3 ?PSYCHIATRIC:  Normal affect  ? ?ASSESSMENT:   ? ?1. Coronary artery disease involving native coronary artery of native heart with  unstable angina pectoris (HCC)   ?2. Chronic chest pain   ?3. Noncompliance   ?4. Hyperlipidemia LDL goal <70   ?5. Obesity (BMI 30-39.9)   ?6. Type 2 diabetes mellitus with complication (HCC)   ? ?PLAN:   ? ?In order of problems listed abo

## 2021-06-23 ENCOUNTER — Ambulatory Visit: Payer: Medicaid Other | Admitting: Physician Assistant

## 2021-06-26 ENCOUNTER — Encounter: Payer: Self-pay | Admitting: Physician Assistant

## 2021-06-26 ENCOUNTER — Ambulatory Visit (INDEPENDENT_AMBULATORY_CARE_PROVIDER_SITE_OTHER): Payer: Self-pay | Admitting: Physician Assistant

## 2021-06-26 VITALS — BP 128/86 | HR 95 | Ht 68.0 in | Wt 247.4 lb

## 2021-06-26 DIAGNOSIS — E118 Type 2 diabetes mellitus with unspecified complications: Secondary | ICD-10-CM

## 2021-06-26 DIAGNOSIS — Z91199 Patient's noncompliance with other medical treatment and regimen due to unspecified reason: Secondary | ICD-10-CM

## 2021-06-26 DIAGNOSIS — R079 Chest pain, unspecified: Secondary | ICD-10-CM

## 2021-06-26 DIAGNOSIS — E785 Hyperlipidemia, unspecified: Secondary | ICD-10-CM

## 2021-06-26 DIAGNOSIS — I25110 Atherosclerotic heart disease of native coronary artery with unstable angina pectoris: Secondary | ICD-10-CM

## 2021-06-26 DIAGNOSIS — G8929 Other chronic pain: Secondary | ICD-10-CM

## 2021-06-26 DIAGNOSIS — E669 Obesity, unspecified: Secondary | ICD-10-CM

## 2021-06-26 MED ORDER — ISOSORBIDE MONONITRATE ER 30 MG PO TB24: 30.0000 mg | ORAL_TABLET | Freq: Every day | ORAL | 3 refills | Status: DC

## 2021-06-26 MED ORDER — ATORVASTATIN CALCIUM 80 MG PO TABS: 80.0000 mg | ORAL_TABLET | Freq: Every day | ORAL | 3 refills | Status: DC

## 2021-06-26 MED ORDER — NITROGLYCERIN 0.4 MG SL SUBL: 0.4000 mg | SUBLINGUAL_TABLET | SUBLINGUAL | 3 refills | Status: DC | PRN

## 2021-06-26 MED ORDER — EZETIMIBE 10 MG PO TABS: 10.0000 mg | ORAL_TABLET | Freq: Every day | ORAL | 3 refills | Status: DC

## 2021-06-26 NOTE — Patient Instructions (Addendum)
Other Instructions ?IF YOU HAVE RECURRENT CHEST PAIN/TIGHTNESS AND/OR DISCOMFORT PLEASE GO TO THE NEAREST EMERGENCY DEPARTMENT  ? ?Medication Instructions:  ?RESTART ASPIRIN EC 81 MG DAILY  ?RESTART ON 06/1521 IMDUR 30 MG NIGHTLY ?RESTART ON 07/07/21 LIPITOR 80 MG DAILY  ?RESTART ON 07/07/21 ZETIA 10 MG DAILY  ? ?*If you need a refill on your cardiac medications before your next appointment, please call your pharmacy* ? ?Lab Work: ?NONE ordered at this time of appointment  ? ?If you have labs (blood work) drawn today and your tests are completely normal, you will receive your results only by: ?MyChart Message (if you have MyChart) OR ?A paper copy in the mail ?If you have any lab test that is abnormal or we need to change your treatment, we will call you to review the results. ? ?Testing/Procedures: ?NONE ordered at this time of appointment  ? ?Follow-Up: ?At [facility], you and your health needs are our priority.  As part of our continuing mission to provide you with exceptional heart care, we have created designated Provider Care Teams.  These Care Teams include your primary Cardiologist (physician) and Advanced Practice Providers (APPs -  Physician Assistants and Nurse Practitioners) who all work together to provide you with the care you need, when you need it. ? ?We recommend signing up for the patient portal called "MyChart".  Sign up information is provided on this After Visit Summary.  MyChart is used to connect with patients for Virtual Visits (Telemedicine).  Patients are able to view lab/test results, encounter notes, upcoming appointments, etc.  Non-urgent messages can be sent to your provider as well.   ?To learn more about what you can do with MyChart, go to [email or web address].   ? ?Your next appointment:   ?4 week(s) ? ?The format for your next appointment:   ?In Person ? ?Provider:   ?Nicki Guadalajara, MD  or Edd Fabian, FNP or Azalee Course, PA-C      ? ? ?Important Information About  [place] ? ? ? ? ? ? ?

## 2021-06-27 ENCOUNTER — Telehealth: Payer: Self-pay | Admitting: Licensed Clinical Social Worker

## 2021-06-27 NOTE — Progress Notes (Signed)
?Heart and Vascular Care Navigation ? ?06/27/2021 ? ?Keith Newton ?Keith Newton ?[id number] ? ?Reason for Referral:  ?Uninsured, SDOH assessment ?Engaged with patient by telephone for initial visit for Heart and Vascular Care Coordination. ?                                                                                                  ?Assessment:    ?Attempted to speak with pt today regarding current lack of insurance on file. Pt answered phone at [phone]. Introduced self, role, reason for call- pt states that he does have insurance but cannot tell me anything further, asks I reach out to his daughter Keith Newton at [phone].  ? ?I was able to do so, left a message. She returned my call. Introduced self, role, reason for call. She confirmed pt home address, PCP and shares that pt does have insurance- his [name] should be active 06/16/2021. She apologizes that he did not bring his card and will try and bring by office- also will bring to PCP next week. Pt lives with his wife Keith Newton, she recieves SSDI and currently pt has no income. Pt daughter has just recently started nursing school. Keith Newton shared that pt has been denied for SNAP in the past due to income which unless there is a significant amount of assets I shared did not seem accurate to me. I encouraged for her to go in person to speak with a caseworker. ? ?We discussed medication affordability and some of the options such as GoodRx, Brilinta and [place] copay cards, and [employer]. We also discussed community resources that may help with various additional costs of living such as LIWHAP and Patent attorney program. I have encouraged her if they get in a bind with costs of living to not hesitate to let us know so we can assess for Patient Care Fund Eligibility or other resources.  ? ?Pt is also eligible for Medicare coverage later this year, I shared that [facility] counselors may be able to assess any needs and assist with  enrollment. Pt daughter shares that they have an Research scientist (life sciences) they work with so that she will be speaking with him.  ? ?No additional questions at this time. Pt daughter will be sent an email with all resources, I remain available.   ? ? ?HRT/VAS Care Coordination   ? ? Patients Home Cardiology Office [facility]  ? Outpatient Care Team Social Worker  ? Social Worker Name: Keith [facility] [phone]  ? Living arrangements for the past 2 months Single Family Home  ? Lives with: Spouse  ? Patient Current Insurance Coverage Self-Pay  ? Patient Has Concern With Paying Medical Bills No  has Cigna  ? Does Patient Have Prescription Coverage? Yes  ? Patient Prescription Assistance Programs Other  ? Other Assistance Programs Medications copay cards;  goodrx  ? Home Assistive Devices/Equipment Eyeglasses; CBG Meter  ? ?  ? ? ?Social History:                                                                             ?  SDOH Screenings  ? ?Alcohol Screen: Not on file  ?Depression (PHQ2-9): Not on file  ?Financial Resource Strain: High Risk  ? Difficulty of Paying Living Expenses: Hard  ?Food Insecurity: Food Insecurity Present  ? Worried About Charity fundraiser in the Last Year: Sometimes true  ? Ran Out of Food in the Last Year: Sometimes true  ?Housing: Medium Risk  ? Last Housing Risk Score: 1  ?Physical Activity: Not on file  ?Social Connections: Not on file  ?Stress: Not on file  ?Tobacco Use: Low Risk   ? Smoking Tobacco Use: Never  ? Smokeless Tobacco Use: Never  ? Passive Exposure: Not on file  ?Transportation Needs: No Transportation Needs  ? Lack of Transportation (Medical): No  ? Lack of Transportation (Non-Medical): No  ? ? ?SDOH Interventions: ?Financial Resources:  Financial Strain Interventions: Other (Comment) (community resources, patient care fund, copay cards, goodrx, [employer] counselors) ?DSS for financial assistance  ?Food Insecurity:  Food Insecurity Interventions:  Assist with [employer] Application, Other (Comment) (food banks)  ?Housing Insecurity:  Housing Interventions: Other (Comment) (community resources; patient care fund)  ?Transportation:   Transportation Interventions: Intervention Not Indicated  ? ? ?Other Care Navigation Interventions:    ? ?Provided Pharmacy assistance resources Other- copay cards, goodrx, [facility] pharmacies  ? ?Follow-up plan:   ?The following email was sent to [name] at her email as requested: [email or web address]. I remain available should they have any additional questions/concerns. They were encouraged to bring his insurance card to all upcoming appointments and pharmacy. ? ?Good Morning! ?I wanted to reach out and provide some further information regarding some of your concerns for your dad. As a reminder please have him bring his insurance card to all appointments as they will bill for the full amount and request higher upfront costs if he does not have that. He should also bring this to the pharmacy any time he gets medications or refills. There may be medications that his insurance will cover completely. Otherwise we have found several things to be helpful for making prescriptions more affordable with commercial insurances like Cigna.  ? ?First is the GoodRx card, it allows medications to be covered at an oftentimes more affordable rate. As you can see from below most of his medications are more affordable at [facility] with the Good Rx Card, his primary office may have physical cards but there is also a way to register online here: Prescription Discount Card - GoodRx ? ?Next, several of his medications are available with a copay card, these are Brilinta and [place] ?Brilinta can be registered here: [place]? (ticagrelor) tablets ?Wilder [place]ade is here: [employer] and [employer]? (dapagliflozin) ?His primary care office may have copay cards, if they do not we can mail them to his home to register a physical  card.  ? ?Third, these medications can also be filled at the [place] or [place] located right near his primary care office and they oftentimes will have the best prices for medications. See the list of his medications and more resources below. ? ? ?acetaminophen (TYLENOL) 325 MG tablet- over the counter, no discounts available ?aspirin EC 81 MG EC tablet- we have samples at [facility], over the counter ?atorvastatin (LIPITOR) 80 MG tablet- $19.64 for 60 days at [facility] ?Blood Glucose Monitoring Suppl (ONETOUCH VERIO IQ SYSTEM) w/Device KIT- ask primary ?dapagliflozin propanediol (FARXIGA) 10 MG TABS tablet- there is a $10 copay card  ?Dulaglutide (TRULICITY) 1.5  MG/0.5ML SOPN- ask primary   ?Elastic Bandages & Supports (MEDICAL COMPRESSION STOCKINGS) MISC- ask primary ?ezetimibe (ZETIA) 10 MG tablet - 90 days is $26.42 at [facility]  ?glucose blood (ONETOUCH VERIO) test strip- ask primary ?insulin degludec (TRESIBA FLEXTOUCH) 100 UNIT/ML FlexTouch Pen- ask primary ?isosorbide mononitrate (IMDUR) 30 MG 24 hr tablet- $7.46 for 60 days at [facility] ?Lancet Devices (ONE TOUCH DELICA LANCING DEV) MISC- ask primary  ?metFORMIN (GLUCOPHAGE) 1000 MG tablet- $4 for 60 days at [facility] ?metoprolol tartrate (LOPRESSOR) 25 MG tablet (Expired)- $18 for 60 days at [facility]  ?nitroGLYCERIN (NITROSTAT) 0.4 MG SL tablet- $9 for 25 pills at [facility] ?NOVOFINE PLUS PEN NEEDLE 32G X 4 MM MISC USE WITH TRESIBA DAILY- ask primary ?OneTouch Delica Lancets 83A MISC- ask primary  ?pregabalin (LYRICA) 75 MG capsule- $17.29 for 60 pills at [facility] ?ticagrelor (BRILINTA) 90 MG TABS tablet- $10 copay cards ? ?I also wanted you to have information about assistance programs in the community.  ?If your mother is the only person in the household with income (as in it is just her and your father living there) and they do not have significant assests in the bank then I would highly recommend re-applying for  [facility] assistance. If there are other family or friends living in the home that have income then their income also has to be considered and that may be why they are not eligible. A caseworker would be the best

## 2021-06-30 NOTE — Progress Notes (Signed)
? ? ?  SUBJECTIVE:  ? ?CHIEF COMPLAINT / HPI:  ?Chief Complaint  ?Patient presents with  ? Diabetes  ?  ?Seen at cardiology office a few days ago, apparently self-discontinued all medications 2-3 months ago due to not feeling well. He states he felt better after he stopped his medications. ? ?"Week 1: start 81 mg ASA ?Week 2: start 30 mg imdur ?Week 3: start 80 mg lipitor and 10 mg zetia" ? ?T2DM ?Medications include metformin 1000 mg twice daily, dapagliflozin 10 mg, and Trulicity 1.5 mg weekly, Tresiba 40 units daily. As above, he has stopped all of these medications. ?Has not been checking his blood sugar. ? ?PERTINENT  PMH / PSH: CAD s/p PCI, T2DM, HTN ? ?Patient Care Team: ?Littie Deeds, MD as PCP - General (Family Medicine) ?Lennette Bihari, MD as PCP - Cardiology (Cardiology)  ? ?OBJECTIVE:  ? ?BP (!) 143/92   Pulse (!) 103   Wt 242 lb (109.8 kg)   SpO2 98%   BMI 36.80 kg/m?   ?Physical Exam ?Constitutional:   ?   General: He is not in acute distress. ?Cardiovascular:  ?   Rate and Rhythm: Normal rate and regular rhythm.  ?Pulmonary:  ?   Effort: Pulmonary effort is normal. No respiratory distress.  ?   Breath sounds: Normal breath sounds.  ?Neurological:  ?   Mental Status: He is alert.  ?  ? ? ?  07/01/2021  ?  2:46 PM  ?Depression screen PHQ 2/9  ?Decreased Interest 0  ?Down, Depressed, Hopeless 0  ?PHQ - 2 Score 0  ?Altered sleeping 0  ?Tired, decreased energy 1  ?Change in appetite 0  ?Feeling bad or failure about yourself  0  ?Trouble concentrating 0  ?Moving slowly or fidgety/restless 0  ?Suicidal thoughts 0  ?PHQ-9 Score 1  ?Difficult doing work/chores Not difficult at all  ?  ? ?{Show previous vital signs (optional):23777} ? ? ? ?ASSESSMENT/PLAN:  ? ?Type 2 diabetes mellitus with complication (HCC) ?Worsening control due to medication non-adherence. Will gradually restart diabetic regimen in conjunction with his cardiac medications (according to most recent cardiology visit). Medication restart  plan as below:  ? ?Start Tresiba 10 units once a day today. ? ?5/19 start 30 mg Imdur once a day day ? ?5/23 start metformin 1000 mg twice a day ? ?5/26 start 80 mg Lipitor and 10 mg Zetia ? ?5/30 start Trulicity 0.75 mg once a week ? ?Hypertension associated with diabetes (HCC) ?Check BMP, consider starting ACEi/ARB in the future once other medications are optimized. ?  ?Unable to urinate for urine microalbumin. ? ?Return in 2 weeks (on 07/15/2021) for f/u DM.  ? ?Littie Deeds, MD ?[facility]  ?

## 2021-06-30 NOTE — Patient Instructions (Addendum)
It was nice seeing you today! ? ?Start Tresiba 10 units once a day today. ? ?5/19 start 30 mg Imdur once a day day ? ?5/23 start metformin 1000 mg twice a day ? ?5/26 start 80 mg Lipitor and 10 mg Zetia ? ?5/30 start Trulicity 0.75 mg once a week ? ?Check your blood sugar every morning before you eat and write them down. ? ?Stay well, ?Littie Deeds, MD ?[facility] ?[phone] ? ?-- ? ?Make sure to check out at the front desk before you leave today. ? ?Please arrive at least 15 minutes prior to your scheduled appointments. ? ?If you had blood work today, I will send you a MyChart message or a letter if results are normal. Otherwise, I will give you a call. ? ?If you had a referral placed, they will call you to set up an appointment. Please give [place] a call if you don't hear back in the next 2 weeks. ? ?If you need additional refills before your next appointment, please call your pharmacy first.  ?

## 2021-07-01 ENCOUNTER — Ambulatory Visit (INDEPENDENT_AMBULATORY_CARE_PROVIDER_SITE_OTHER): Payer: Commercial Managed Care - HMO | Admitting: Family Medicine

## 2021-07-01 ENCOUNTER — Other Ambulatory Visit: Payer: Self-pay

## 2021-07-01 ENCOUNTER — Encounter: Payer: Self-pay | Admitting: Family Medicine

## 2021-07-01 VITALS — BP 143/92 | HR 103 | Ht 68.0 in | Wt 242.0 lb

## 2021-07-01 DIAGNOSIS — I152 Hypertension secondary to endocrine disorders: Secondary | ICD-10-CM | POA: Diagnosis not present

## 2021-07-01 DIAGNOSIS — I10 Essential (primary) hypertension: Secondary | ICD-10-CM | POA: Diagnosis not present

## 2021-07-01 DIAGNOSIS — E118 Type 2 diabetes mellitus with unspecified complications: Secondary | ICD-10-CM

## 2021-07-01 DIAGNOSIS — E1159 Type 2 diabetes mellitus with other circulatory complications: Secondary | ICD-10-CM

## 2021-07-01 LAB — POCT GLYCOSYLATED HEMOGLOBIN (HGB A1C): HbA1c, POC (controlled diabetic range): 11.4 % — AB (ref 0.0–7.0)

## 2021-07-01 MED ORDER — TRULICITY 1.5 MG/0.5ML ~~LOC~~ SOAJ
0.7500 mg | SUBCUTANEOUS | 0 refills | Status: DC
Start: 2021-07-01 — End: 2021-11-27

## 2021-07-01 NOTE — Assessment & Plan Note (Signed)
Check BMP, consider starting ACEi/ARB in the future once other medications are optimized. ?

## 2021-07-01 NOTE — Assessment & Plan Note (Addendum)
Worsening control due to medication non-adherence. Will gradually restart diabetic regimen in conjunction with his cardiac medications (according to most recent cardiology visit). Medication restart plan as below:  ? ?Start Tresiba 10 units once a day today. ? ?5/19 start 30 mg Imdur once a day day ? ?5/23 start metformin 1000 mg twice a day ? ?5/26 start 80 mg Lipitor and 10 mg Zetia ? ?5/30 start Trulicity 0.75 mg once a week ?

## 2021-07-02 ENCOUNTER — Encounter: Payer: Self-pay | Admitting: Family Medicine

## 2021-07-02 LAB — BASIC METABOLIC PANEL
BUN/Creatinine Ratio: 14 (ref 10–24)
BUN: 16 mg/dL (ref 8–27)
CO2: 23 mmol/L (ref 20–29)
Calcium: 10.2 mg/dL (ref 8.6–10.2)
Chloride: 97 mmol/L (ref 96–106)
Creatinine, Ser: 1.12 mg/dL (ref 0.76–1.27)
Glucose: 267 mg/dL — ABNORMAL HIGH (ref 70–99)
Potassium: 4.7 mmol/L (ref 3.5–5.2)
Sodium: 138 mmol/L (ref 134–144)
eGFR: 73 mL/min/{1.73_m2} (ref >59)

## 2021-07-07 ENCOUNTER — Telehealth: Payer: Self-pay | Admitting: Cardiovascular Disease

## 2021-07-07 NOTE — Telephone Encounter (Signed)
LMTCB

## 2021-07-07 NOTE — Telephone Encounter (Signed)
Pt's daughter would like for a nurse to give her a call regarding pt's medications. Please advise

## 2021-07-08 ENCOUNTER — Telehealth: Payer: Self-pay

## 2021-07-08 ENCOUNTER — Other Ambulatory Visit (HOSPITAL_COMMUNITY): Payer: Self-pay

## 2021-07-08 NOTE — Telephone Encounter (Signed)
Follow Up:      [name] is returning Sarah's call from yesterday(07-07-21).

## 2021-07-08 NOTE — Telephone Encounter (Signed)
Patients daughter calls nurse line in regards to diabetic medications.   Daughter reports his current regime is Metformin, Trulicity, [place] and [place].   Daughter reports he is only taking Metformin and Tresiba at this time. Daughter reports Marcelline Deist has been making him dizzy and Trulicity is unaffordable.   Daughter plans to come to [facility] apt next week with PCP.   In the meantime daughter is requesting samples of Trulicity if available as well as a box of Evaristo Bury as he will be out soon.  Will forward to PCP and Pharmacy Team for samples.

## 2021-07-08 NOTE — Telephone Encounter (Signed)
Called daughter back. No answer at this time. Left message to return the call.

## 2021-07-08 NOTE — Telephone Encounter (Signed)
Spoke with pt's daughter. She had questions about the medications the pt is to restart based off of his last office visit. We went over that. She asked for samples, she is made aware we do not have samples of those medications. She will get the medications from the pharmacy today.

## 2021-07-11 ENCOUNTER — Telehealth: Payer: Self-pay | Admitting: Cardiovascular Disease

## 2021-07-11 ENCOUNTER — Other Ambulatory Visit: Payer: Self-pay

## 2021-07-11 ENCOUNTER — Emergency Department (HOSPITAL_COMMUNITY)
Admission: EM | Admit: 2021-07-11 | Discharge: 2021-07-11 | Disposition: A | Discharge status: Home / Self Care | Payer: Commercial Managed Care - HMO | Attending: Emergency Medicine | Admitting: Emergency Medicine

## 2021-07-11 ENCOUNTER — Emergency Department (HOSPITAL_COMMUNITY): Payer: Commercial Managed Care - HMO

## 2021-07-11 ENCOUNTER — Other Ambulatory Visit: Payer: Self-pay | Admitting: Cardiovascular Disease

## 2021-07-11 ENCOUNTER — Encounter (HOSPITAL_COMMUNITY): Payer: Self-pay | Admitting: Emergency Medicine

## 2021-07-11 DIAGNOSIS — I2510 Atherosclerotic heart disease of native coronary artery without angina pectoris: Secondary | ICD-10-CM | POA: Insufficient documentation

## 2021-07-11 DIAGNOSIS — Z794 Long term (current) use of insulin: Secondary | ICD-10-CM | POA: Diagnosis not present

## 2021-07-11 DIAGNOSIS — E1140 Type 2 diabetes mellitus with diabetic neuropathy, unspecified: Secondary | ICD-10-CM | POA: Diagnosis not present

## 2021-07-11 DIAGNOSIS — Z7982 Long term (current) use of aspirin: Secondary | ICD-10-CM | POA: Diagnosis not present

## 2021-07-11 DIAGNOSIS — Z7984 Long term (current) use of oral hypoglycemic drugs: Secondary | ICD-10-CM | POA: Diagnosis not present

## 2021-07-11 DIAGNOSIS — R42 Dizziness and giddiness: Secondary | ICD-10-CM | POA: Diagnosis present

## 2021-07-11 DIAGNOSIS — I10 Essential (primary) hypertension: Secondary | ICD-10-CM | POA: Diagnosis not present

## 2021-07-11 LAB — CBC
HCT: 47.9 % (ref 39.0–52.0)
Hemoglobin: 15.2 g/dL (ref 13.0–17.0)
MCH: 27.8 pg (ref 26.0–34.0)
MCHC: 31.7 g/dL (ref 30.0–36.0)
MCV: 87.6 fL (ref 80.0–100.0)
Platelets: 281 10*3/uL (ref 150–400)
RBC: 5.47 MIL/uL (ref 4.22–5.81)
RDW: 14.4 % (ref 11.5–15.5)
WBC: 5.9 10*3/uL (ref 4.0–10.5)
nRBC: 0 % (ref 0.0–0.2)

## 2021-07-11 LAB — BASIC METABOLIC PANEL
Anion gap: 10 (ref 5–15)
BUN: 9 mg/dL (ref 8–23)
CO2: 24 mmol/L (ref 22–32)
Calcium: 9.6 mg/dL (ref 8.9–10.3)
Chloride: 103 mmol/L (ref 98–111)
Creatinine, Ser: 1.07 mg/dL (ref 0.61–1.24)
GFR, Estimated: >60 mL/min (ref >60)
Glucose, Bld: 189 mg/dL — ABNORMAL HIGH (ref 70–99)
Potassium: 4 mmol/L (ref 3.5–5.1)
Sodium: 137 mmol/L (ref 135–145)

## 2021-07-11 LAB — LACTIC ACID, PLASMA: Lactic Acid, Venous: 1.2 mmol/L (ref 0.5–1.9)

## 2021-07-11 LAB — CK: Total CK: 156 U/L (ref 49–397)

## 2021-07-11 LAB — TROPONIN I (HIGH SENSITIVITY): Troponin I (High Sensitivity): 6 ng/L (ref <18)

## 2021-07-11 MED ORDER — METOPROLOL TARTRATE 25 MG PO TABS: 25.0000 mg | ORAL_TABLET | Freq: Two times a day (BID) | ORAL | 3 refills | Status: DC

## 2021-07-11 MED ORDER — METOPROLOL TARTRATE 25 MG PO TABS
25.0000 mg | ORAL_TABLET | Freq: Once | ORAL | Status: AC
Start: 2021-07-11 — End: 2021-07-11
  Administered 2021-07-11: 25 mg via ORAL
  Filled 2021-07-11: qty 1

## 2021-07-11 MED ORDER — METOPROLOL TARTRATE 25 MG PO TABS: 75.0000 mg | ORAL_TABLET | Freq: Two times a day (BID) | ORAL | 5 refills | Status: DC

## 2021-07-11 NOTE — Telephone Encounter (Signed)
*  STAT* If patient is at the pharmacy, call can be transferred to refill team.   1. Which medications need to be refilled? (please list name of each medication and dose if known)  metoprolol tartrate (LOPRESSOR) 25 MG tablet 90  2. Which pharmacy/location (including street and city if local pharmacy) is medication to be sent to? [id number] - [place], [place] - [address]  3. Do they need a 30 day or 90 day supply?   90 day supply  [name] states the patient is completely out of medication.

## 2021-07-11 NOTE — Telephone Encounter (Signed)
Pt c/o BP issue: STAT if pt c/o blurred vision, one-sided weakness or slurred speech  1. What are your last 5 BP readings?  144/100's  170/?? Both taken prior to going to the ED  2. Are you having any other symptoms (ex. Dizziness, headache, blurred vision, passed out)?  Dizziness  3. What is your BP issue?   [name] states today the patient's BP became elevated and they took him to the ED. HR was also in the 100's. He is now back home, but in the ED they advised him to take his BP meds, which she states he has not been taking. She requested a refill for Metoprolol as he is completely out of medication.

## 2021-07-11 NOTE — Telephone Encounter (Signed)
Daughter reports patient went to the ED this morning for elevated BP 144/100s. She wanted to have patient back on metoprolol tartrate. Spoke with Dr. [place] (DOD). He ordered metoprolol tartrate 25 mg twice daily. Daughter informed of the medication and prescription sent to pharmacy. Also, informed daughter that patient needs to check BP 2 hours after taking medication to check for effectiveness. She voiced understanding.

## 2021-07-11 NOTE — ED Provider Notes (Signed)
[facility] EMERGENCY DEPARTMENT Provider Note   CSN: [id number] Arrival date & time: 07/11/21  0701     History  Chief Complaint  Patient presents with   Hypertension    Keith Newton is a 66 y.o. male.  The history is provided by the patient, a relative and medical records. No language interpreter was used.  Hypertension   66 year old male significant history of hypertension, diabetes, CAD, hyperlipidemia, obesity presenting for evaluation of dizziness.  History obtained through patient and family member who is at bedside.  Per daughter, patient was noted to have an elevated A1c of 11 during his last PCP office visit and she was concerned that it is high and she would like to have that number decreased.  Furthermore patient having been compliant with his blood pressure medications because it does not make him feel well therefore he take it sporadically.  Daughter noticed that his blood pressure has been running quite high and states his last blood pressure this morning was 102 something systolic.  Patient overall mention he endorsed occasional bouts of dizziness and and described more as a sense of imbalance usually brought on by movement and resolves at rest.  He endorsed occasional pain going across his chest that happen sporadically without any particular pattern and not history involved with exertion.  No active chest pain at this time.  Endorse nausea and vomiting occasionally with specks of blood.  All of his symptoms has been a recurrent issue without any new issue.  Patient states "I am here because my daughter made me".  Home Medications Prior to Admission medications   Medication Sig Start Date End Date Taking? Authorizing Provider  acetaminophen (TYLENOL) 325 MG tablet Take 2 tablets (650 mg total) by mouth every 6 (six) hours as needed. 11/05/20   Zola Button, MD  aspirin EC 81 MG EC tablet Take 1 tablet (81 mg total) by mouth daily. 12/25/18   Strader,  Fransisco Hertz, PA-C  atorvastatin (LIPITOR) 80 MG tablet Take 1 tablet (80 mg total) by mouth daily. 06/26/21   Duke, Tami Lin, PA  Blood Glucose Monitoring Suppl (ONETOUCH VERIO IQ SYSTEM) w/Device KIT Check fasting blood glucose in AM and before giving insulin. 08/30/19   Benay Pike, MD  dapagliflozin propanediol (FARXIGA) 10 MG TABS tablet Take 1 tablet (10 mg total) by mouth daily. 03/27/20   Benay Pike, MD  Dulaglutide (TRULICITY) 1.5 HE/5.2DP SOPN Inject 0.75 mg into the skin once a week. 07/01/21   Zola Button, MD  Elastic Bandages & Supports (MEDICAL COMPRESSION STOCKINGS) [place] Wear daily 04/01/20   Benay Pike, MD  ezetimibe (ZETIA) 10 MG tablet Take 1 tablet (10 mg total) by mouth daily. 06/26/21   Duke, Tami Lin, PA  glucose blood [facility] VERIO) test strip Use as instructed 08/17/19   Benay Pike, MD  insulin degludec (TRESIBA FLEXTOUCH) 100 UNIT/ML FlexTouch Pen Inject 40 Units into the skin daily. 05/23/20   Benay Pike, MD  isosorbide mononitrate (IMDUR) 30 MG 24 hr tablet Take 1 tablet (30 mg total) by mouth daily. 06/26/21   Duke, Tami Lin, PA  Lancet Devices (ONE TOUCH DELICA LANCING DEV) MISC Check fasting blood glucose in AM and before giving insulin. 08/17/19   Benay Pike, MD  metFORMIN (GLUCOPHAGE) 1000 MG tablet TAKE 1 TABLET BY MOUTH TWICE A DAY 11/18/20   Zola Button, MD  metoprolol tartrate (LOPRESSOR) 25 MG tablet Take 3 tablets (75 mg total) by mouth 2 (  two) times daily. 08/07/20 11/05/20  Troy Sine, MD  nitroGLYCERIN (NITROSTAT) 0.4 MG SL tablet Place 1 tablet (0.4 mg total) under the tongue every 5 (five) minutes x 3 doses as needed for chest pain. 06/26/21   Duke, Tami Lin, PA  NOVOFINE PLUS PEN NEEDLE 32G X 4 MM MISC USE WITH TRESIBA DAILY 07/02/20   Benay Pike, MD  OneTouch Delica Lancets 85I MISC Check fasting blood glucose in AM and before giving insulin. 08/17/19   Benay Pike, MD  pregabalin (LYRICA) 75 MG capsule Take 1  capsule (75 mg total) by mouth 3 (three) times daily. 02/21/21   Zola Button, MD  ticagrelor (BRILINTA) 90 MG TABS tablet Take 1 tablet (90 mg total) by mouth 2 (two) times daily. 03/17/21   Erma Heritage, PA-C      Allergies    Codeine    Review of Systems   Review of Systems  All other systems reviewed and are negative.  Physical Exam Updated Vital Signs BP (!) 156/109   Pulse 98   Temp 98 F (36.7 C) (Oral)   Resp 18   Ht _0  (1.727 m)   Wt 112 kg   SpO2 98%   BMI 37.56 kg/m  Physical Exam Vitals and nursing note reviewed.  Constitutional:      General: He is not in acute distress.    Appearance: He is well-developed.  HENT:     Head: Atraumatic.  Eyes:     Conjunctiva/sclera: Conjunctivae normal.  Cardiovascular:     Rate and Rhythm: Normal rate and regular rhythm.     Pulses: Normal pulses.     Heart sounds: Normal heart sounds.  Pulmonary:     Effort: Pulmonary effort is normal.     Breath sounds: Normal breath sounds. No wheezing, rhonchi or rales.  Abdominal:     Palpations: Abdomen is soft.     Tenderness: There is no abdominal tenderness.  Musculoskeletal:     Cervical back: Neck supple.     Right lower leg: No edema.     Left lower leg: No edema.  Skin:    Findings: No rash.  Neurological:     Mental Status: He is alert. Mental status is at baseline.    ED Results / Procedures / Treatments   Labs (all labs ordered are listed, but only abnormal results are displayed) Labs Reviewed  BASIC METABOLIC PANEL - Abnormal; Notable for the following components:      Result Value   Glucose, Bld 189 (*)    All other components within normal limits  CBC  LACTIC ACID, PLASMA  CK  LACTIC ACID, PLASMA  TROPONIN I (HIGH SENSITIVITY)  TROPONIN I (HIGH SENSITIVITY)    EKG None  Date: 07/11/2021  Rate: 98  Rhythm: normal sinus rhythm  QRS Axis: normal  Intervals: normal  ST/T Wave abnormalities: normal  Conduction Disutrbances: none  Narrative  Interpretation:   Old EKG Reviewed: No significant changes noted    Radiology DG Chest Port 1 View  Result Date: 07/11/2021 CLINICAL DATA:  Dizziness EXAM: PORTABLE CHEST 1 VIEW COMPARISON:  chest x-ray dated December 23, 2018 FINDINGS: The heart size and mediastinal contours are within normal limits. Both lungs are clear. The visualized skeletal structures are unremarkable. IMPRESSION: No active disease. Electronically Signed   By: Yetta Glassman M.D.   On: 07/11/2021 08:04    Procedures Procedures    Medications Ordered in ED Medications  metoprolol tartrate (LOPRESSOR) tablet  25 mg (25 mg Oral Given 07/11/21 0825)    ED Course/ Medical Decision Making/ A&P                           Medical Decision Making Amount and/or Complexity of Data Reviewed Labs: ordered. Radiology: ordered.   BP 125/89   Pulse 88   Temp 98 F (36.7 C) (Oral)   Resp (!) 2   Ht _0  (1.727 m)   Wt 112 kg   SpO2 95%   BMI 37.56 kg/m   7:49 AM This is a 66 year old male with multiple comorbidities which includes hypertension, diabetes, hyperlipidemia, CAD, orthostatic dizziness along with diabetic neuropathy sent here due to concerns of poorly controlled hypertension and poorly controlled diabetes.  Patient admits that he does not take his medication on a regular basis because it does not make him feel well.  Daughter noted that his blood pressure has been elevated as well as his A1c being 11 during his most recent doctor's visit.  She would like for these values to be treated in the ER.  On exam patient is resting comfortably appears to be in no acute discomfort.  He does not appear to be fluid overloaded.  His abdomen is soft and nontender.  Heart and lung sounds normal.  Vital signs remarkable for mild elevated blood pressure of 156/109.  Patient is afebrile, no hypoxia.  Endorse occasional chest pain but no active chest pain.  Endorses occasional bouts of nausea and vomiting but no active vomiting  and abdomen is soft and nontender.  Time was spent discussing plan of care with patient family member which includes the importance of taking his medication regular basis for better control of his blood pressure.  Furthermore, I also recommend diet exercise and monitor his p.o. intake to decrease risk of worsening diabetes.  He does not endorse polyuria or polydipsia to suggest states of DKA.  Work-up initiated, will give patient his home medication.  Patient is on multiple diabetic medication which I felt could contribute to him "not feeling well".  He is taking metformin as well as Zetia, Trulicity, [place] and Lipitor.  Will obtain CK and lactic acid to assess for potential side effect from this medication.  9:32 AM Labs EKG imaging independently viewed interpreted by me and I agree with radiology interpretation.  I obtain lactic acid and total CK both came back normal therefore have low suspicion for complication of his medication causing rhabdomyolysis or myalgias or lactic acidosis.  CBG mildly elevated at 189 with normal Anion gap normal WBC, normal H&H, troponin negative.  Chest x-ray without focal infiltrate or concerning finding.  EKG without concerning arrhythmia or ischemic changes.  Blood pressure normalized after he received his home hypertensive medication.  Request to be discharged.  Patient is stable for discharge  This patient presents to the ED for concern of HTN, this involves an extensive number of treatment options, and is a complaint that carries with it a high risk of complications and morbidity.  The differential diagnosis includes poorly complaint with HTN medication, dietary changes, increase sodium intake, lack of exercise, ACS, CVA, spurious BP reading  Co morbidities that complicate the patient evaluation HTN  DM Additional history obtained:  Additional history obtained from family member External records from outside source obtained and reviewed including notes from  PCP  Lab Tests:  I Ordered, and personally interpreted labs.  The pertinent results include:  as above  Imaging Studies ordered:  I ordered imaging studies including CXR I independently visualized and interpreted imaging which showed unremarkable I agree with the radiologist interpretation  Cardiac Monitoring:  The patient was maintained on a cardiac monitor.  I personally viewed and interpreted the cardiac monitored which showed an underlying rhythm of: NSR  Medicines ordered and prescription drug management:  I ordered medication including metoprolol  for HTN Reevaluation of the patient after these medicines showed that the patient improved I have reviewed the patients home medicines and have made adjustments as needed  Test Considered: as above  Critical Interventions: bp medication   Problem List / ED Course: HTN  Reevaluation:  After the interventions noted above, I reevaluated the patient and found that they have :improved  Social Determinants of Health: financial restraint  Dispostion:  After consideration of the diagnostic results and the patients response to treatment, I feel that the patent would benefit from outpt f/u and to take medications as prescribed. .         Final Clinical Impression(s) / ED Diagnoses Final diagnoses:  Hypertension, unspecified type    Rx / DC Orders ED Discharge Orders     None         Domenic Moras, PA-C 07/11/21 8350    Lorelle Gibbs, DO 07/11/21 1012

## 2021-07-11 NOTE — Patient Instructions (Incomplete)
It was nice seeing you today!  Wait until next week to start the Lyrica. You can take it up to 3 times a day.  Expect a call from our social worker.  Stay well, Keith Deeds, MD [facility] [phone]  --  Make sure to check out at the front desk before you leave today.  Please arrive at least 15 minutes prior to your scheduled appointments.  If you had blood work today, I will send you a MyChart message or a letter if results are normal. Otherwise, I will give you a call.  If you had a referral placed, they will call you to set up an appointment. Please give [place] a call if you don't hear back in the next 2 weeks.  If you need additional refills before your next appointment, please call your pharmacy first.

## 2021-07-11 NOTE — Progress Notes (Unsigned)
    SUBJECTIVE:   CHIEF COMPLAINT / HPI:  No chief complaint on file.   ***  PERTINENT  PMH / PSH: ***  Patient Care Team: Littie Deeds, MD as PCP - General (Family Medicine) Lennette Bihari, MD as PCP - Cardiology (Cardiology)   OBJECTIVE:   There were no vitals taken for this visit.  Physical Exam      07/01/2021    2:46 PM  Depression screen PHQ 2/9  Decreased Interest 0  Down, Depressed, Hopeless 0  PHQ - 2 Score 0  Altered sleeping 0  Tired, decreased energy 1  Change in appetite 0  Feeling bad or failure about yourself  0  Trouble concentrating 0  Moving slowly or fidgety/restless 0  Suicidal thoughts 0  PHQ-9 Score 1  Difficult doing work/chores Not difficult at all     {Show previous vital signs (optional):23777}  {Labs  Heme  Chem  Endocrine  Serology  Results Review (optional):23779}  ASSESSMENT/PLAN:   No problem-specific Assessment & Plan notes found for this encounter.    No follow-ups on file.   Littie Deeds, MD [facility]

## 2021-07-11 NOTE — Telephone Encounter (Signed)
[name] returning call. 

## 2021-07-11 NOTE — ED Triage Notes (Signed)
Pt to ER with c/o HTN with dizziness and not feeling well.  Pt has hx of HTN but has not been taking medications.

## 2021-07-11 NOTE — Discharge Instructions (Addendum)
Your evaluation in the ER today is reassuring.  No concerning finding noted.  Please take your medication as prescribed for better blood pressure control and follow-up with your doctor for further care.

## 2021-07-15 ENCOUNTER — Ambulatory Visit (INDEPENDENT_AMBULATORY_CARE_PROVIDER_SITE_OTHER): Payer: Commercial Managed Care - HMO | Admitting: Family Medicine

## 2021-07-15 ENCOUNTER — Other Ambulatory Visit: Payer: Self-pay | Admitting: Family Medicine

## 2021-07-15 ENCOUNTER — Encounter: Payer: Self-pay | Admitting: Family Medicine

## 2021-07-15 VITALS — BP 137/90 | HR 83 | Ht 68.0 in | Wt 243.6 lb

## 2021-07-15 DIAGNOSIS — E118 Type 2 diabetes mellitus with unspecified complications: Secondary | ICD-10-CM | POA: Diagnosis not present

## 2021-07-15 DIAGNOSIS — E1142 Type 2 diabetes mellitus with diabetic polyneuropathy: Secondary | ICD-10-CM

## 2021-07-15 DIAGNOSIS — Z5941 Food insecurity: Secondary | ICD-10-CM

## 2021-07-15 MED ORDER — TRESIBA FLEXTOUCH 100 UNIT/ML ~~LOC~~ SOPN: 40.0000 [IU] | PEN_INJECTOR | Freq: Every day | SUBCUTANEOUS | 4 refills | Status: DC

## 2021-07-15 MED ORDER — NOVOFINE PLUS PEN NEEDLE 32G X 4 MM MISC: 6 refills | Status: DC

## 2021-07-15 MED ORDER — PREGABALIN 50 MG PO CAPS: 50.0000 mg | ORAL_CAPSULE | Freq: Three times a day (TID) | ORAL | 0 refills | Status: DC | PRN

## 2021-07-15 NOTE — Telephone Encounter (Signed)
Attempted to contact patient daughter- LVM to call back to speak with triage.  Left call back number.

## 2021-07-15 NOTE — Telephone Encounter (Signed)
Left voicemail w/ patients daughter requesting call back regarding prepared samples and other medication info.  Medication Samples have been provided to the patient.  Drug name: TRESIBA       Strength: U100        Qty: 2 BOXES (2 PENS)  LOT: [id number]  Exp.Date: 03/19/23  Dosing instructions: INJECT 10 UNITS ONCE DAILY  (60 DAY SUPPLY)   Keith Newton 10:11 AM 07/15/2021

## 2021-07-16 ENCOUNTER — Telehealth: Payer: Self-pay

## 2021-07-16 ENCOUNTER — Encounter: Payer: Self-pay | Admitting: Family Medicine

## 2021-07-16 LAB — MICROALBUMIN / CREATININE URINE RATIO
Creatinine, Urine: 53.3 mg/dL
Microalb/Creat Ratio: <6 mg/g creat (ref 0–29)
Microalbumin, Urine: <3 ug/mL

## 2021-07-16 NOTE — Assessment & Plan Note (Addendum)
Will continue to resume his prior diabetic regimen. Now to restart Trulicity and [place]. Check urine microalbumin. Next A1c check in 10-12 weeks.

## 2021-07-16 NOTE — Assessment & Plan Note (Signed)
Restarted pregabalin at starting dose 50 mg TID prn. Advised to wait 1 week prior to starting.

## 2021-07-16 NOTE — Telephone Encounter (Signed)
   Telephone encounter was:  Unsuccessful.  07/16/2021 Name: Keith Newton MRN: [id number] DOB: 1955-08-24  Unsuccessful outbound call made today to assist with:  Food Insecurity  Outreach Attempt:  1st Attempt  A HIPAA compliant voice message was left requesting a return call.  Instructed patient to call back at earliest convenience. Lenard Forth Care Guide, Embedded Care Coordination [facility], Care Management  [phone] [address] [place], [place], [place] [address] Phone: [phone] Email: [email or web address]

## 2021-07-18 ENCOUNTER — Telehealth: Payer: Self-pay

## 2021-07-18 NOTE — Telephone Encounter (Signed)
   Telephone encounter was:  Unsuccessful.  07/18/2021 Name: Keith Newton MRN: [id number] DOB: Dec 23, 1955  Unsuccessful outbound call made today to assist with:  Food Insecurity  Outreach Attempt:  2nd Attempt  A HIPAA compliant voice message was left requesting a return call.  Instructed patient to call back at  earliest convenience. [place], Care Management  [phone] [address], [place], [place] [address] Phone: [phone] Email: [email or web address]

## 2021-07-21 ENCOUNTER — Telehealth: Payer: Self-pay

## 2021-07-21 NOTE — Telephone Encounter (Signed)
   Telephone encounter was:  Unsuccessful.  07/21/2021 Name: Keith Newton MRN: [id number] DOB: 17-Aug-1955  Unsuccessful outbound call made today to assist with:  Food Insecurity  Outreach Attempt:  3rd Attempt.  Referral closed unable to contact patient.  A HIPAA compliant voice message was left requesting a return call.  Instructed patient to call back at  earliest convenience. Lenard Forth Care Guide, Embedded Care Coordination [facility], Care Management  [phone] [address] [place], [place], [place] [address] Phone: [phone] Email: [email or web address]

## 2021-07-21 NOTE — Telephone Encounter (Signed)
   Telephone encounter was:  Successful.  07/21/2021 Name: Keith Newton MRN: [id number] DOB: [birth year]  Keith Newton is a 66 y.o. year old male who is a primary care patient of Zola Button, MD . The community resource team was consulted for assistance with [facility] guide performed the following interventions: Patient provided with information about care guide support team and interviewed to confirm resource needs.Patients daughter requested that I email resources to her for her parents for food and financial assistance  Follow Up Plan:  No further follow up planned at this time. The patient has been provided with needed resources.    [place], Care Management  [phone] [address], [place], [place] [address] Phone: [phone] Email: [email or web address]

## 2021-07-22 NOTE — Telephone Encounter (Signed)
Upcoming visit with PA on 06/13

## 2021-07-29 ENCOUNTER — Ambulatory Visit (INDEPENDENT_AMBULATORY_CARE_PROVIDER_SITE_OTHER): Payer: Commercial Managed Care - HMO | Admitting: Physician Assistant

## 2021-07-29 ENCOUNTER — Encounter: Payer: Self-pay | Admitting: Physician Assistant

## 2021-07-29 VITALS — BP 118/82 | HR 89 | Ht 68.0 in | Wt 242.8 lb

## 2021-07-29 DIAGNOSIS — I25119 Atherosclerotic heart disease of native coronary artery with unspecified angina pectoris: Secondary | ICD-10-CM

## 2021-07-29 DIAGNOSIS — E119 Type 2 diabetes mellitus without complications: Secondary | ICD-10-CM

## 2021-07-29 DIAGNOSIS — I10 Essential (primary) hypertension: Secondary | ICD-10-CM

## 2021-07-29 DIAGNOSIS — R072 Precordial pain: Secondary | ICD-10-CM

## 2021-07-29 DIAGNOSIS — E785 Hyperlipidemia, unspecified: Secondary | ICD-10-CM | POA: Diagnosis not present

## 2021-07-29 MED ORDER — METOPROLOL TARTRATE 50 MG PO TABS: 50.0000 mg | ORAL_TABLET | Freq: Two times a day (BID) | ORAL | 3 refills | Status: DC

## 2021-07-29 NOTE — Patient Instructions (Signed)
Medication Instructions:  DECREASE Metoprolol Tartrate to 50 mg 2 times a day *If you need a refill on your cardiac medications before your next appointment, please call your pharmacy*  Lab Work: NONE ordered at this time of appointment   If you have labs (blood work) drawn today and your tests are completely normal, you will receive your results only by: [place]ium (if you have MyChart) OR A paper copy in the mail If you have any lab test that is abnormal or we need to change your treatment, we will call you to review the results.  Testing/Procedures: CARDIAC PET- Your physician has requested that you have a Cardiac Pet Stress Test. This testing is completed at [facility] ([place], [place] [address]). The schedulers will call you to get this scheduled. Please follow instructions below and call the office with any questions/concerns [phone]).  Follow-Up: At [facility], you and your health needs are our priority.  As part of our continuing mission to provide you with exceptional heart care, we have created designated Provider Care Teams.  These Care Teams include your primary Cardiologist (physician) and Advanced Practice Providers (APPs -  Physician Assistants and Nurse Practitioners) who all work together to provide you with the care you need, when you need it.  We recommend signing up for the patient portal called "MyChart".  Sign up information is provided on this After Visit Summary.  MyChart is used to connect with patients for Virtual Visits (Telemedicine).  Patients are able to view lab/test results, encounter notes, upcoming appointments, etc.  Non-urgent messages can be sent to your provider as well.   To learn more about what you can do with MyChart, go to [email or web address].    Your next appointment:   2-3 month(s)  The format for your next appointment:   In Person  Provider:   Shelva Majestic, MD  or  APP         Other  Instructions How to Prepare for Your Cardiac PET/CT Stress Test:  1. Please do not take these medications before your test:   Medications that may interfere with the cardiac pharmacological stress agent (ex. nitrates - including erectile dysfunction medications or beta-blockers) the day of the exam. (Erectile dysfunction medication should be held for at least 72 hrs prior to test) Theophylline containing medications for 12 hours. Dipyridamole 48 hours prior to the test. Your remaining medications may be taken with water.  2. Nothing to eat or drink, except water, 3 hours prior to arrival time.   NO caffeine/decaffeinated products, or chocolate 12 hours prior to arrival.  3. NO perfume, cologne or lotion  4. Total time is 1 to 2 hours; you may want to bring reading material for the waiting time.  5. Please report to Admitting at the [facility] Main Entrance 60 minutes early for your test.  [place], [place] [address]  Diabetic Preparation:  Hold oral medications. You may take NPH and Lantus insulin. Do not take Humalog or Humulin R (Regular Insulin) the day of your test. Check blood sugars prior to leaving the house. If able to eat breakfast prior to 3 hour fasting, you may take all medications, including your insulin, Do not worry if you miss your breakfast dose of insulin - start at your next meal.  IF YOU THINK YOU MAY BE PREGNANT, OR ARE NURSING PLEASE INFORM THE TECHNOLOGIST.  In preparation for your appointment, medication and supplies will be  purchased.  Appointment availability is limited, so if you need to cancel or reschedule, please call the Radiology Department at [phone]  24 hours in advance to avoid a cancellation fee of $100.00  What to Expect After you Arrive:  Once you arrive and check in for your appointment, you will be taken to a preparation room within the Radiology Department.  A technologist or Nurse will obtain your medical  history, verify that you are correctly prepped for the exam, and explain the procedure.  Afterwards,  an IV will be started in your arm and electrodes will be placed on your skin for EKG monitoring during the stress portion of the exam. Then you will be escorted to the PET/CT scanner.  There, staff will get you positioned on the scanner and obtain a blood pressure and EKG.  During the exam, you will continue to be connected to the EKG and blood pressure machines.  A small, safe amount of a radioactive tracer will be injected in your IV to obtain a series of pictures of your heart along with an injection of a stress agent.    After your Exam:  It is recommended that you eat a meal and drink a caffeinated beverage to counter act any effects of the stress agent.  Drink plenty of fluids for the remainder of the day and urinate frequently for the first couple of hours after the exam.  Your doctor will inform you of your test results within 7-10 business days.  For questions about your test or how to prepare for your test, please call: Marchia Bond, Cardiac Imaging Nurse Navigator  Gordy Clement, Cardiac Imaging Nurse Navigator Office: [phone]   Important Information About Sugar

## 2021-07-29 NOTE — Progress Notes (Signed)
Cardiology Office Note:    Date:  07/31/2021   ID:  Keith Newton, DOB 02/05/1956, MRN [id number]  PCP:  Zola Button, MD   [facility] Providers Cardiologist:  Shelva Majestic, MD     Referring MD: Zola Button, MD   Chief Complaint  Patient presents with   Follow-up    Seen for Dr. Claiborne Billings    History of Present Illness:    Keith Newton is a 66 y.o. male with a hx of CAD s/p PCI/DES x 2 of RCA 2020, HTN, HLD, DM II, obesity and chronic chest pain. Myoview obtained in 11/2019 was nonischemic.  Repeat Myoview for preoperative clearance for shoulder surgery in March 2020 showed prior MI, peri-infarct ischemia, EF 30 to 44%.  He was last seen by Fabian Sharp PA-C on 06/26/2021 at which time he was still having chest pain.  Unfortunately he has stopped all of his medication because he did not feel well.  He was instructed to resume his cardiac medication in a stepwise fashion.  On week 1, he was instructed to restart aspirin 81 mg daily.  During weak 2, he was instructed to start on 30 mg daily of Imdur.  During week 3, he was instructed to start on Lipitor and Zetia.  Since last visit, he was seen in the ED on 07/11/2021 due to elevated blood pressure.  It was mentioned that he was only taking his medication sporadically.  Blood pressure in the ED was in the 150s.  Total CK and troponin was negative.  Lactic acid normal.  He was treated with metoprolol tartrate 25 mg with improvement in the blood pressure.  Since then, he has called our office and was restarted on 25 mg twice a day of metoprolol tartrate.  Patient presents today for follow-up along with his daughter.  His daughter mentions he is more compliant with the medication in the past month.  He has continued to have twice weekly episode of chest pain radiating from the right chest across to the left chest.  This is the same chest pain he has been having since his previous heart attack in 2020.  However his recent chest pain has been getting a  little bit more intense.  He also complained of some dizziness after taking his medication.  Talking with the daughter, I suspect he is still on the old 75 mg twice a day of metoprolol, I asked them to cut his metoprolol down to 50 mg twice a day.  His daughter is going to verify the dosage of the metoprolol once she get home.  As for the slightly worse chest pain, I recommended a PET stress test to further assess.  I instructed the patient to hold off on any kind of caffeinated drinks for 24 hours prior to the study.  Otherwise, he can follow-up in 2 months.  He is aware that in order to come be considered for cardiac catheterization, he absolutely has to be compliant with his medication.  Past Medical History:  Diagnosis Date   Arthritis    Back pain    CAD (coronary artery disease)    a. s/p NSTEMI in 12/2018 with 80% Prox-RCA stenosis followed by 100% prox to mid-RCA stenosis --> DESx2 to the RCA. Med management of residual disease.    Carpal tunnel syndrome    DDD (degenerative disc disease)    Diabetes mellitus    type 2   Hyperlipidemia    Hypertension    states very mild  Low testosterone    Myocardial infarction ([place])    Overweight    Seasonal allergies    Tachycardia     Past Surgical History:  Procedure Laterality Date   CARDIAC CATHETERIZATION     CARPAL TUNNEL RELEASE  07/02/2011   Procedure: CARPAL TUNNEL RELEASE;  Surgeon: Cammie Sickle., MD;  Location: [place];  Service: Orthopedics;  Laterality: Right;   CARPAL TUNNEL RELEASE  07/30/2011   Procedure: CARPAL TUNNEL RELEASE;  Surgeon: Cammie Sickle., MD;  Location: [place];  Service: Orthopedics;  Laterality: Left;   CORONARY ANGIOPLASTY     CORONARY STENT INTERVENTION N/A 12/23/2018   Procedure: CORONARY STENT INTERVENTION;  Surgeon: Jettie Booze, MD;  Location: [place] [facility];  Service: Cardiovascular;  Laterality: N/A;   LEFT HEART CATH AND CORONARY ANGIOGRAPHY  N/A 12/23/2018   Procedure: LEFT HEART CATH AND CORONARY ANGIOGRAPHY;  Surgeon: Jettie Booze, MD;  Location: [place] [facility];  Service: Cardiovascular;  Laterality: N/A;   LUMBAR EPIDURAL INJECTION     x2   SHOULDER ARTHROSCOPY WITH OPEN ROTATOR CUFF REPAIR AND DISTAL CLAVICLE ACROMINECTOMY Right 06/03/2020   Procedure: right shoulder arthroscopy, biceps tenodesis, mini open rotator cuff tear repair subscap/supraspinatus, lateral debridement;  Surgeon: Meredith Pel, MD;  Location: [place];  Service: Orthopedics;  Laterality: Right;    Current Medications: Current Meds  Medication Sig   acetaminophen (TYLENOL) 325 MG tablet Take 2 tablets (650 mg total) by mouth every 6 (six) hours as needed.   aspirin EC 81 MG EC tablet Take 1 tablet (81 mg total) by mouth daily.   atorvastatin (LIPITOR) 80 MG tablet Take 1 tablet (80 mg total) by mouth daily.   Blood Glucose Monitoring Suppl (ONETOUCH VERIO IQ SYSTEM) w/Device KIT Check fasting blood glucose in AM and before giving insulin.   dapagliflozin propanediol (FARXIGA) 10 MG TABS tablet Take 1 tablet (10 mg total) by mouth daily.   Dulaglutide (TRULICITY) 1.5 ZO/1.0RU SOPN Inject 0.75 mg into the skin once a week.   ezetimibe (ZETIA) 10 MG tablet Take 1 tablet (10 mg total) by mouth daily.   glucose blood (ONETOUCH VERIO) test strip Use as instructed   Insulin Degludec FlexTouch 100 UNIT/ML SOPN Inject 40 Units into the skin daily.   Insulin Pen Needle (NOVOFINE PLUS PEN NEEDLE) 32G X 4 MM MISC USE WITH TRESIBA DAILY   isosorbide mononitrate (IMDUR) 30 MG 24 hr tablet Take 1 tablet (30 mg total) by mouth daily.   Lancet Devices (ONE TOUCH DELICA LANCING DEV) MISC Check fasting blood glucose in AM and before giving insulin.   metFORMIN (GLUCOPHAGE) 1000 MG tablet TAKE 1 TABLET BY MOUTH TWICE A DAY   OneTouch Delica Lancets 04V MISC Check fasting blood glucose in AM and before giving insulin.   pregabalin (LYRICA) 50 MG capsule Take 1  capsule (50 mg total) by mouth 3 (three) times daily as needed.   ticagrelor (BRILINTA) 90 MG TABS tablet Take 1 tablet (90 mg total) by mouth 2 (two) times daily.   [DISCONTINUED] metoprolol tartrate (LOPRESSOR) 25 MG tablet Take 1 tablet (25 mg total) by mouth 2 (two) times daily.     Allergies:   Codeine   Social History   Socioeconomic History   Marital status: Married    Spouse name: Jacobey Gura   Number of children: 2   Years of education: 16   Highest education level: Bachelor's degree (e.g., BA, AB, BS)  Occupational History   Occupation: retired  Tobacco Use   Smoking status: Never   Smokeless tobacco: Never  Vaping Use   Vaping Use: Never used  Substance and Sexual Activity   Alcohol use: No   Drug use: No   Sexual activity: Not on file  Other Topics Concern   Not on file  Social History Narrative   Not on file   Social Determinants of Health   Financial Resource Strain: High Risk (06/27/2021)   Overall Financial Resource Strain (CARDIA)    Difficulty of Paying Living Expenses: Hard  Food Insecurity: Food Insecurity Present (07/21/2021)   Hunger Vital Sign    Worried About Running Out of Food in the Last Year: Often true    Ran Out of Food in the Last Year: Often true  Transportation Needs: No Transportation Needs (06/27/2021)   PRAPARE - Hydrologist (Medical): No    Lack of Transportation (Non-Medical): No  Physical Activity: Inactive (02/14/2019)   Exercise Vital Sign    Days of Exercise per Week: 0 days    Minutes of Exercise per Session: 0 min  Stress: No Stress Concern Present (02/14/2019)   [place]    Feeling of Stress : Only a little  Social Connections: Not on file     Family History: The patient's family history includes CAD in his father, maternal grandfather, and paternal grandfather; Stroke in his mother.  ROS:   Please see the history of  present illness.     All other systems reviewed and are negative.  EKGs/Labs/Other Studies Reviewed:    The following studies were reviewed today:  Myoview 04/26/2020 Nuclear stress EF: 42%. The left ventricular ejection fraction is moderately decreased (30-44%). There was no ST segment deviation noted during stress. Defect 1: There is a medium defect of moderate severity present in the basal anteroseptal and basal inferoseptal location. Findings consistent with prior myocardial infarction with peri-infarct ischemia. This is an intermediate risk study.   There is a defect in the basal septum that worsens with stress. This is also consistent with an area of dyskinesis. Suggestive of infarct with peri-infarct ischemia. TID elevated, cannot exclude global ischemia. This is a change since prior study. Prior images personally reviewed.   EKG:  EKG is not ordered today.    Recent Labs: 08/13/2020: TSH 1.490 07/11/2021: BUN 9; Creatinine, Ser 1.07; Hemoglobin 15.2; Platelets 281; Potassium 4.0; Sodium 137  Recent Lipid Panel    Component Value Date/Time   CHOL 194 11/05/2020 1545   TRIG 289 (H) 11/05/2020 1545   HDL 41 11/05/2020 1545   CHOLHDL 4.7 11/05/2020 1545   CHOLHDL 6.6 12/24/2018 0214   VLDL 25 12/24/2018 0214   LDLCALC 104 (H) 11/05/2020 1545   LDLDIRECT 58 02/21/2021 1007     Risk Assessment/Calculations:           Physical Exam:    VS:  BP 118/82 (BP Location: Left Arm, Patient Position: Sitting, Cuff Size: Large)   Pulse 89   Ht 5' 8"  (1.727 m)   Wt 242 lb 12.8 oz (110.1 kg)   SpO2 94%   BMI 36.92 kg/m     Wt Readings from Last 3 Encounters:  07/29/21 242 lb 12.8 oz (110.1 kg)  07/15/21 243 lb 9.6 oz (110.5 kg)  07/11/21 247 lb (112 kg)     GEN:  Well nourished, well developed in no acute distress HEENT: Normal NECK: No  JVD; No carotid bruits LYMPHATICS: No lymphadenopathy CARDIAC: RRR, no murmurs, rubs, gallops RESPIRATORY:  Clear to auscultation  without rales, wheezing or rhonchi  ABDOMEN: Soft, non-tender, non-distended MUSCULOSKELETAL:  No edema; No deformity  SKIN: Warm and dry NEUROLOGIC:  Alert and oriented x 3 PSYCHIATRIC:  Normal affect   ASSESSMENT:    1. Precordial pain   2. Coronary artery disease involving native coronary artery of native heart with angina pectoris ([place])   3. Essential hypertension   4. Hyperlipidemia LDL goal <70   5. Controlled type 2 diabetes mellitus without complication, without long-term current use of insulin (HCC)    PLAN:    In order of problems listed above:  Precordial chest pain: He has been having twice weekly episode of precordial chest pain since the previous heart attack in 2020.  His symptom has been getting a little bit worse recently.  Last Myoview obtained on 04/26/2020 was intermediate risk study showed prior myocardial infarction with peri-infarct ischemia.  CAD: On aspirin and Lipitor  Hypertension: Blood pressure stable  Hyperlipidemia: On Lipitor  DM2: Managed by primary care provider.      Shared Decision Making/Informed Consent The risks [chest pain, shortness of breath, cardiac arrhythmias, dizziness, blood pressure fluctuations, myocardial infarction, stroke/transient ischemic attack, nausea, vomiting, allergic reaction, radiation exposure, metallic taste sensation and life-threatening complications (estimated to be 1 in 10,000)], benefits (risk stratification, diagnosing coronary artery disease, treatment guidance) and alternatives of a cardiac PET stress test were discussed in detail with Keith Newton and he agrees to proceed.    Medication Adjustments/Labs and Tests Ordered: Current medicines are reviewed at length with the patient today.  Concerns regarding medicines are outlined above.  Orders Placed This Encounter  Procedures   NM PET CT CARDIAC PERFUSION MULTI W/ABSOLUTE BLOODFLOW   Cardiac Stress Test: Informed Consent Details: Physician/Practitioner  Attestation; Transcribe to consent form and obtain patient signature   Meds ordered this encounter  Medications   metoprolol tartrate (LOPRESSOR) 50 MG tablet    Sig: Take 1 tablet (50 mg total) by mouth 2 (two) times daily.    Dispense:  180 tablet    Refill:  3    Dose change new Rx    Patient Instructions  Medication Instructions:  DECREASE Metoprolol Tartrate to 50 mg 2 times a day *If you need a refill on your cardiac medications before your next appointment, please call your pharmacy*  Lab Work: NONE ordered at this time of appointment   If you have labs (blood work) drawn today and your tests are completely normal, you will receive your results only by: MyChart Message (if you have MyChart) OR A paper copy in the mail If you have any lab test that is abnormal or we need to change your treatment, we will call you to review the results.  Testing/Procedures: CARDIAC PET- Your physician has requested that you have a Cardiac Pet Stress Test. This testing is completed at [facility] ([place], [place] [address]). The schedulers will call you to get this scheduled. Please follow instructions below and call the office with any questions/concerns [phone]).  Follow-Up: At [facility], you and your health needs are our priority.  As part of our continuing mission to provide you with exceptional heart care, we have created designated Provider Care Teams.  These Care Teams include your primary Cardiologist (physician) and Advanced Practice Providers (APPs -  Physician Assistants and Nurse Practitioners) who all work together to provide you with  the care you need, when you need it.  We recommend signing up for the patient portal called "MyChart".  Sign up information is provided on this After Visit Summary.  MyChart is used to connect with patients for Virtual Visits (Telemedicine).  Patients are able to view lab/test results, encounter notes,  upcoming appointments, etc.  Non-urgent messages can be sent to your provider as well.   To learn more about what you can do with MyChart, go to [email or web address].    Your next appointment:   2-3 month(s)  The format for your next appointment:   In Person  Provider:   Shelva Majestic, MD  or  APP         Other Instructions How to Prepare for Your Cardiac PET/CT Stress Test:  1. Please do not take these medications before your test:   Medications that may interfere with the cardiac pharmacological stress agent (ex. nitrates - including erectile dysfunction medications or beta-blockers) the day of the exam. (Erectile dysfunction medication should be held for at least 72 hrs prior to test) Theophylline containing medications for 12 hours. Dipyridamole 48 hours prior to the test. Your remaining medications may be taken with water.  2. Nothing to eat or drink, except water, 3 hours prior to arrival time.   NO caffeine/decaffeinated products, or chocolate 12 hours prior to arrival.  3. NO perfume, cologne or lotion  4. Total time is 1 to 2 hours; you may want to bring reading material for the waiting time.  5. Please report to Admitting at the [facility] Main Entrance 60 minutes early for your test.  [place], [place] [address]  Diabetic Preparation:  Hold oral medications. You may take NPH and Lantus insulin. Do not take Humalog or Humulin R (Regular Insulin) the day of your test. Check blood sugars prior to leaving the house. If able to eat breakfast prior to 3 hour fasting, you may take all medications, including your insulin, Do not worry if you miss your breakfast dose of insulin - start at your next meal.  IF YOU THINK YOU MAY BE PREGNANT, OR ARE NURSING PLEASE INFORM THE TECHNOLOGIST.  In preparation for your appointment, medication and supplies will be purchased.  Appointment availability is limited, so if you need to cancel or reschedule,  please call the Radiology Department at [phone]  24 hours in advance to avoid a cancellation fee of $100.00  What to Expect After you Arrive:  Once you arrive and check in for your appointment, you will be taken to a preparation room within the Radiology Department.  A technologist or Nurse will obtain your medical history, verify that you are correctly prepped for the exam, and explain the procedure.  Afterwards,  an IV will be started in your arm and electrodes will be placed on your skin for EKG monitoring during the stress portion of the exam. Then you will be escorted to the PET/CT scanner.  There, staff will get you positioned on the scanner and obtain a blood pressure and EKG.  During the exam, you will continue to be connected to the EKG and blood pressure machines.  A small, safe amount of a radioactive tracer will be injected in your IV to obtain a series of pictures of your heart along with an injection of a stress agent.    After your Exam:  It is recommended that you eat a meal and drink a caffeinated beverage to counter act any effects of  the stress agent.  Drink plenty of fluids for the remainder of the day and urinate frequently for the first couple of hours after the exam.  Your doctor will inform you of your test results within 7-10 business days.  For questions about your test or how to prepare for your test, please call: Marchia Bond, Cardiac Imaging Nurse Navigator  Gordy Clement, Cardiac Imaging Nurse Navigator Office: [phone]   Important Information About Sugar         Hilbert Corrigan, [place]  07/31/2021 12:59 PM    [place]

## 2021-07-31 ENCOUNTER — Encounter: Payer: Self-pay | Admitting: Physician Assistant

## 2021-08-12 ENCOUNTER — Other Ambulatory Visit: Payer: Self-pay | Admitting: Family Medicine

## 2021-08-26 NOTE — Telephone Encounter (Signed)
Returned samples to stock. Never picked up.

## 2021-09-02 ENCOUNTER — Telehealth: Payer: Self-pay

## 2021-09-02 NOTE — Telephone Encounter (Signed)
[name] calls nurse line regarding diabetic medication management. Patient was previously on [place] and daughter noticed that he was experiencing dizziness while taking.   Daughter is asking if alternative such as Jardiance or Invokana can be sent in.   Advised that patient may need appointment to discuss further medication management.   Please advise.   Veronda Prude, RN

## 2021-09-03 NOTE — Telephone Encounter (Signed)
Please advise patient/daughter that he should schedule office appointment to further discuss his dizziness and medications.

## 2021-09-04 NOTE — Telephone Encounter (Signed)
Returned call to daughter. No answer, LVM asking her to call office to schedule appointment.   Veronda Prude, RN

## 2021-09-09 ENCOUNTER — Other Ambulatory Visit: Payer: Self-pay | Admitting: Family Medicine

## 2021-10-23 ENCOUNTER — Ambulatory Visit: Payer: Commercial Managed Care - HMO | Admitting: Family Medicine

## 2021-10-23 NOTE — Progress Notes (Deleted)
    SUBJECTIVE:   CHIEF COMPLAINT / HPI:  No chief complaint on file.   T2DM Medications include metformin 1000 mg twice daily, dapagliflozin 10 mg, and Trulicity 1.5 mg weekly, Tresiba 40 units daily.  PERTINENT  PMH / PSH: CAD s/p PCI, T2DM, HTN  Patient Care Team: Littie Deeds, MD as PCP - General (Family Medicine) Lennette Bihari, MD as PCP - Cardiology (Cardiology)   OBJECTIVE:   There were no vitals taken for this visit.  Physical Exam      07/15/2021    2:04 PM  Depression screen PHQ 2/9  Decreased Interest 2  Down, Depressed, Hopeless 1  PHQ - 2 Score 3  Altered sleeping 2  Tired, decreased energy 0  Change in appetite 0  Feeling bad or failure about yourself  0  Trouble concentrating 0  Moving slowly or fidgety/restless 0  Suicidal thoughts 0  PHQ-9 Score 5  Difficult doing work/chores Not difficult at all     {Show previous vital signs (optional):23777}  {Labs  Heme  Chem  Endocrine  Serology  Results Review (optional):23779}  ASSESSMENT/PLAN:   No problem-specific Assessment & Plan notes found for this encounter.   HCM - colonoscopy - DM eye exam  No follow-ups on file.   Littie Deeds, MD [facility]

## 2021-10-31 ENCOUNTER — Encounter: Payer: Self-pay | Admitting: Family Medicine

## 2021-10-31 ENCOUNTER — Ambulatory Visit (INDEPENDENT_AMBULATORY_CARE_PROVIDER_SITE_OTHER): Payer: Commercial Managed Care - HMO | Admitting: Family Medicine

## 2021-10-31 VITALS — BP 133/88 | HR 92 | Ht 68.0 in | Wt 239.0 lb

## 2021-10-31 DIAGNOSIS — E1169 Type 2 diabetes mellitus with other specified complication: Secondary | ICD-10-CM | POA: Diagnosis not present

## 2021-10-31 DIAGNOSIS — Z9861 Coronary angioplasty status: Secondary | ICD-10-CM

## 2021-10-31 DIAGNOSIS — I152 Hypertension secondary to endocrine disorders: Secondary | ICD-10-CM

## 2021-10-31 DIAGNOSIS — E1159 Type 2 diabetes mellitus with other circulatory complications: Secondary | ICD-10-CM

## 2021-10-31 DIAGNOSIS — I2510 Atherosclerotic heart disease of native coronary artery without angina pectoris: Secondary | ICD-10-CM | POA: Diagnosis not present

## 2021-10-31 DIAGNOSIS — E118 Type 2 diabetes mellitus with unspecified complications: Secondary | ICD-10-CM | POA: Diagnosis not present

## 2021-10-31 DIAGNOSIS — E785 Hyperlipidemia, unspecified: Secondary | ICD-10-CM

## 2021-10-31 LAB — POCT GLYCOSYLATED HEMOGLOBIN (HGB A1C): HbA1c, POC (controlled diabetic range): 10.8 % — AB (ref 0.0–7.0)

## 2021-10-31 NOTE — Assessment & Plan Note (Signed)
A1c still poorly controlled today, but having trouble tolerating medications With shared decision making, decided to restart Basaglar 15 U daily. Discussed sign/sx of hypoglycemia. His daughter was on the phone and we discsused checking fasting BG. Scheduled for close f/u next week with Dr Raymondo Band, to consider if he can get CGM versus further uptitration of insulin. Holding metformin as this causes GI upset for him. Holding [place] and Trulicity while we try to figure out what medications make him feel bad in the mornings.

## 2021-10-31 NOTE — Progress Notes (Signed)
    SUBJECTIVE:   CHIEF COMPLAINT / HPI:   T2DM- Stopped taking all of his medications 2 weeks ago. He was on metformin 1000mg  BID, Farxiga 10mg , Trulicity 0.75 mg weekly, Insulin Basaglar 40 units. Last A1c 11.4 in May. He notes all his medications make him feel dizzy and he does not like that, it takes up his entire morning.  HTN- was taking Imdur 30mg  daily, lopressor 50mg  BID. NO longer taking them for past 2 weeks.  CAD s/p PCI- Was taking Brilinta BID, aspirin 81mg , Imdur, atorvastation and PRN nitro. No chest pain, no shortness of breath. Hasn't been taking any of these medications for the last two weeks.  PERTINENT  PMH / PSH: T2DM, CAD s/p PCI, HTN, obseity  OBJECTIVE:   BP 133/88   Pulse 92   Ht 5\' 8"  (1.727 m)   Wt 239 lb (108.4 kg)   SpO2 95%   BMI 36.34 kg/m   General: alert & oriented, no apparent distress, well groomed HEENT: normocephalic, atraumatic, EOM grossly intact, oral mucosa moist, neck supple Respiratory: normal respiratory effort GI: non-distended Skin: no rashes, no jaundice Psych: appropriate mood and affect   ASSESSMENT/PLAN:   Type 2 diabetes mellitus with complication (HCC) A1c still poorly controlled today, but having trouble tolerating medications With shared decision making, decided to restart Basaglar 15 U daily. Discussed sign/sx of hypoglycemia. His daughter was on the phone and we discsused checking fasting BG. Scheduled for close f/u next week with Dr , to consider if he can get CGM versus further uptitration of insulin. Holding metformin as this causes GI upset for him. Holding [place] and Trulicity while we try to figure out what medications make him feel bad in the mornings.  CAD -S/P PCI Discussed importance of aspirin/Brilinta, will restart today as well as metoprolol 25 mg BID Consider restarting Imdur next week if tolerating well Asymptomatic today  Hypertension associated with diabetes (HCC) Continue metoprolol, consider  adding Imdur back next week  Hyperlipidemia associated with type 2 diabetes mellitus (HCC), LDL goal < 70 Consider restarting statin next week     , MD [facility]

## 2021-10-31 NOTE — Patient Instructions (Addendum)
It was wonderful to see you today.  Please bring ALL of your medications with you to every visit.   Today we talked about:  I want you to keep taking your aspirin, Brilinta, metoprolol tartrate twice daily.  Lets restart just your insulin Basaglar at 15 units daily.   We have scheduled you follow up next week to see our pharmacist Dr Raymondo Band to talk about diabetes monitor and going up on your insulin. If you are feeling well we will likely restart your imdur at that time.   Thank you for choosing [facility].   Please call [phone] with any questions about today's appointment.  Please be sure to schedule follow up at the front  desk before you leave today.   Please arrive at least 15 minutes prior to your scheduled appointments.   If you had blood work today, I will send you a MyChart message or a letter if results are normal. Otherwise, I will give you a call.   If you had a referral placed, they will call you to set up an appointment. Please give [place] a call if you don't hear back in the next 2 weeks.   If you need additional refills before your next appointment, please call your pharmacy first.   Burley Saver, MD  Family Medicine

## 2021-10-31 NOTE — Assessment & Plan Note (Signed)
Consider restarting statin next week

## 2021-10-31 NOTE — Assessment & Plan Note (Signed)
Discussed importance of aspirin/Brilinta, will restart today as well as metoprolol 25 mg BID Consider restarting Imdur next week if tolerating well Asymptomatic today

## 2021-10-31 NOTE — Assessment & Plan Note (Signed)
Continue metoprolol, consider adding Imdur back next week

## 2021-11-03 ENCOUNTER — Telehealth (HOSPITAL_COMMUNITY): Payer: Self-pay | Admitting: *Deleted

## 2021-11-03 NOTE — Telephone Encounter (Signed)
Attempted to call patient regarding upcoming cardiac PET appointment. Left message on voicemail with name and callback number  Atzin Buchta RN Navigator Cardiac Imaging [facility] [phone] Office [phone] Cell  

## 2021-11-03 NOTE — Telephone Encounter (Signed)
[name] returning call regarding upcoming cardiac imaging study; she verbalizes understanding of appt date/time, parking situation and where to check in, pre-test NPO status and verified current allergies; name and call back number provided for further questions should they arise  Gordy Clement RN [place] [facility] [phone] office [phone] cell  [name] aware the patient is to avoid Imdur starting today and avoid caffeine 12 hours prior to cardiac PET test.

## 2021-11-04 ENCOUNTER — Encounter (HOSPITAL_COMMUNITY)
Admission: RE | Admit: 2021-11-04 | Discharge: 2021-11-04 | Disposition: A | Discharge status: Home / Self Care | Payer: Commercial Managed Care - HMO | Source: Ambulatory Visit | Attending: Physician Assistant | Admitting: Physician Assistant

## 2021-11-04 DIAGNOSIS — R072 Precordial pain: Secondary | ICD-10-CM | POA: Diagnosis present

## 2021-11-04 MED ORDER — REGADENOSON 0.4 MG/5ML IV SOLN
INTRAVENOUS | Status: AC
  Filled 2021-11-04: qty 5

## 2021-11-04 MED ORDER — RUBIDIUM RB82 GENERATOR (RUBYFILL)
28.4400 | PACK | Freq: Once | INTRAVENOUS | Status: AC
  Administered 2021-11-04: 28.44 via INTRAVENOUS

## 2021-11-04 MED ORDER — RUBIDIUM RB82 GENERATOR (RUBYFILL)
28.4500 | PACK | Freq: Once | INTRAVENOUS | Status: AC
  Administered 2021-11-04: 28.45 via INTRAVENOUS

## 2021-11-05 ENCOUNTER — Ambulatory Visit (INDEPENDENT_AMBULATORY_CARE_PROVIDER_SITE_OTHER): Payer: Commercial Managed Care - HMO | Admitting: Pharmacist

## 2021-11-05 ENCOUNTER — Encounter: Payer: Self-pay | Admitting: Pharmacist

## 2021-11-05 DIAGNOSIS — E118 Type 2 diabetes mellitus with unspecified complications: Secondary | ICD-10-CM | POA: Diagnosis not present

## 2021-11-05 DIAGNOSIS — I2510 Atherosclerotic heart disease of native coronary artery without angina pectoris: Secondary | ICD-10-CM

## 2021-11-05 DIAGNOSIS — Z9861 Coronary angioplasty status: Secondary | ICD-10-CM | POA: Diagnosis not present

## 2021-11-05 LAB — NM PET CT CARDIAC PERFUSION MULTI W/ABSOLUTE BLOODFLOW
LV dias vol: 90 mL (ref 62–150)
LV sys vol: 35 mL
MBFR: 2.49
Nuc Rest EF: 55 %
Nuc Stress EF: 61 %
Peak HR: 95 {beats}/min
Rest HR: 68 {beats}/min
Rest MBF: 0.71 ml/g/min
Rest Nuclear Isotope Dose: 28.5 mCi
ST Depression (mm): 0 mm
Stress MBF: 1.77 ml/g/min
Stress Nuclear Isotope Dose: 28.4 mCi
TID: 1.07

## 2021-11-05 MED ORDER — FREESTYLE LIBRE 3 SENSOR MISC: 1.0000 | 11 refills | Status: DC

## 2021-11-05 NOTE — Assessment & Plan Note (Addendum)
ASCVD risk - secondary prevention in patient with diabetes. Last LDL is 58. ASCVD risk factors include T2DM. High intensity statin indicated -Restarted atorvastatin 80 mg.  Consider restart Ezetimibe at next visit.   Patient with history of CAD and HTN. Hypertension longstanding currently controlled. Blood pressure goal of <130/80 mmHg.  - Restart isosorbide mononitrate (Imdur) 30 mg daily (instructed to take 1/2 of the 60mg  tablet in possession.

## 2021-11-05 NOTE — Progress Notes (Signed)
S:     Chief Complaint  Patient presents with   Medication Management    T2DM/CGM   Keith Newton is a 66 y.o. male who presents for diabetes evaluation, education, and management.  PMH is significant for T2DM, HTN, CAD.   Patient was referred by Dr. Thompson Grayer on 9/152023. Patient was last seen by Primary Care Provider, Dr. Nancy Fetter, on 07/15/2021.  At last visit, restarted Basaglar (insuiln glargine) 15 units daily, metoprolol, ticagrelor, and aspirin.   Today, patient arrives in good spirits and presents without any assistance. His daughter was on the phone throughout the visit and was the source of the majority of medical history including medication use details.    [name] reports Diabetes was diagnosed more than 5 years.   Current diabetes medications include: insulin glargine (Basaglar) 15 units daily Current hypertension medications include: metoprolol 50 mg BID, imdur (not taking) Current hyperlipidemia medications include: atorvastatin, ezetimibe (not taking either)  Patient reports adherence to taking aspirin, insulin glargine, metoprolol tartrate, and ticagrelor. Patient reports not taking any other medications as he stopped them due to feeling dizzy and lightheaded. Patient and daughter report decrease in symptoms after stopping all medications. He restarted metoprolol, insulin glargine, aspirin, and ticagrelor at visit with Dr. Thompson Grayer on 9/15.  Have you been experiencing any side effects to the medications prescribed? No side effects reported since restarting some medications on 9/15 Insurance coverage: [employer], planning to switch to [employer] on 12/12/2021  Reported not checking home fasting blood sugars.  O:   Review of Systems  All other systems reviewed and are negative.   Physical Exam Constitutional:      Appearance: Normal appearance.  Pulmonary:     Effort: Pulmonary effort is normal.  Neurological:     Mental Status: He is alert.       Lab Results  Component Value Date   HGBA1C 10.8 (A) 10/31/2021   There were no vitals filed for this visit.  Lipid Panel     Component Value Date/Time   CHOL 194 11/05/2020 1545   TRIG 289 (H) 11/05/2020 1545   HDL 41 11/05/2020 1545   CHOLHDL 4.7 11/05/2020 1545   CHOLHDL 6.6 12/24/2018 0214   VLDL 25 12/24/2018 0214   LDLCALC 104 (H) 11/05/2020 1545   LDLDIRECT 58 02/21/2021 1007    A/P: Diabetes longstanding currently uncontrolled. Medication adherence appears poor. Recently stopped all medications due to dizziness and feeling "sick" following use.  Currently lowly re-adding medications back and patient is tolerating current regimen.  Control is suboptimal likely multifactorial. -Continued basal insulin glargine (insulin Basaglar) 15 units daily - HOLD  GLP-1 Trulicity (generic dulaglutide). - HOLD  SGLT2-I Farxiga (generic dapagliflozin). - HOLD  metformin. -Patient educated on purpose, proper use, and potential adverse effects of medications.  -Extensively discussed pathophysiology of diabetes, recommended lifestyle interventions, dietary effects on blood sugar control.  -Counseled on s/sx of and management of hypoglycemia.  CGM initiated - sample [place] 3 sensor provided. Basic instruction provided but not applied in office as the family has used in the past with patient's spouse and would like to set up APP on phone prior to applying sensor.  Follow-up with PCP, Dr. Nancy Fetter in 8 days.  Reevaluate use of CGM at that time.   ASCVD risk - secondary prevention in patient with diabetes. Last LDL is 58. ASCVD risk factors include T2DM. High intensity statin indicated -Restarted atorvastatin 80 mg.   Patient with history of CAD and HTN.  Hypertension longstanding currently controlled. Blood pressure goal of <130/80 mmHg.  - Restart isosorbide mononitrate (Imdur) 30 mg daily (instructed to take 1/2 of the 60mg  tablet in possession.   Written patient instructions provided.  Patient verbalized understanding of treatment plan.  Total time in face to face counseling 45 minutes.    Follow-up:  PCP clinic visit on 11/13/2021.  Patient seen with Jeneen Rinks, PharmD PGY-1 Resident. [place] Kitchen

## 2021-11-05 NOTE — Assessment & Plan Note (Signed)
Diabetes longstanding currently uncontrolled. Medication adherence appears poor. Recently stopped all medications due to dizziness and feeling "sick" following use.  Currently lowly re-adding medications back and patient is tolerating current regimen.  Control is suboptimal likely multifactorial. -Continued basal insulin glargine (insulin Basaglar) 15 units daily -HOLD GLP-1 Trulicity (generic dulaglutide). -HOLD SGLT2-I Farxiga (generic dapagliflozin). -HOLD metformin. -Patient educated on purpose, proper use, and potential adverse effects of medications.  -Extensively discussed pathophysiology of diabetes, recommended lifestyle interventions, dietary effects on blood sugar control.  -Counseled on s/sx of and management of hypoglycemia.  CGM initiated - sample [place] 3 sensor provided. Basic instruction provided but not applied in office as the family has used in the past with patient's spouse and would like to set up APP on phone prior to applying sensor.  Follow-up with PCP, Dr. Nancy Fetter in 8 days.  Reevaluate use of CGM at that time.

## 2021-11-05 NOTE — Progress Notes (Signed)
Reviewed: I agree with Dr. Koval's documentation and management. 

## 2021-11-05 NOTE — Patient Instructions (Addendum)
It was great to see you today!  Today, we sent you with a Freestyle Libre Continuous Blood Glucose Monitor. Please download the app and apply the sensor at home. Avoid high dose salicylic acid (aspirin) and vitamin C products while wearing sensor. Continue checking blood glucose and taking medications as normal.  We will send a prescription for the Libre 3 to [facility].  Restart isosorbide mononitrate (IMDUR) 30 mg daily (please break 60 mg tablets in half and take 1/2 tablet daily)  Restart atorvastatin 80 mg daily

## 2021-11-06 ENCOUNTER — Ambulatory Visit: Payer: Commercial Managed Care - HMO | Admitting: Family Medicine

## 2021-11-07 ENCOUNTER — Telehealth: Payer: Self-pay

## 2021-11-07 NOTE — Telephone Encounter (Signed)
Daughter returned CMA's call. 

## 2021-11-07 NOTE — Telephone Encounter (Signed)
Called the patient and he asked that we call his daughter. Daughter made aware of results.

## 2021-11-07 NOTE — Telephone Encounter (Addendum)
Left voice message for patient to call back for test results.  ----- Message from [place], [place] sent at 11/07/2021  2:28 PM EDT ----- Reassuring stress test result, please inform the patient and daughter that the stress test was normal. No evidence of significant reversible blockage.

## 2021-11-13 ENCOUNTER — Ambulatory Visit (INDEPENDENT_AMBULATORY_CARE_PROVIDER_SITE_OTHER): Payer: Commercial Managed Care - HMO | Admitting: Family Medicine

## 2021-11-13 ENCOUNTER — Encounter: Payer: Self-pay | Admitting: Family Medicine

## 2021-11-13 VITALS — BP 124/87 | HR 90 | Ht 68.0 in | Wt 241.4 lb

## 2021-11-13 DIAGNOSIS — E118 Type 2 diabetes mellitus with unspecified complications: Secondary | ICD-10-CM

## 2021-11-13 MED ORDER — DAPAGLIFLOZIN PROPANEDIOL 5 MG PO TABS: 5.0000 mg | ORAL_TABLET | Freq: Every day | ORAL | 2 refills | Status: DC

## 2021-11-13 MED ORDER — FLUTICASONE PROPIONATE 50 MCG/ACT NA SUSP: 1.0000 | Freq: Every day | NASAL | 2 refills | Status: DC

## 2021-11-13 NOTE — Patient Instructions (Addendum)
It was nice seeing you today!  Start Farxiga at 5 mg.  Continue the Basaglar at 20 units.  Leave him off the metformin and Trulicity for now.  Try Flonase for your sore throat.  Follow-up with myself or Dr. Valentina Lucks in 2-4 weeks.  Specific Provider options Psychology Today  https://www.psychologytoday.com/us click on find a therapist  enter your zip code left side and select or tailor a therapist for your specific need.   Stay well, Keith Button, MD [place] [phone]  --  Make sure to check out at the front desk before you leave today.  Please arrive at least 15 minutes prior to your scheduled appointments.  If you had blood work today, I will send you a MyChart message or a letter if results are normal. Otherwise, I will give you a call.  If you had a referral placed, they will call you to set up an appointment. Please give [place] a call if you don't hear back in the next 2 weeks.  If you need additional refills before your next appointment, please call your pharmacy first.  --  Therapy and Counseling Resources Most providers on this list will take Medicaid. Patients with commercial insurance or Medicare should contact their insurance company to get a list of in network providers.  [employer] [employer] (takes children) Location 1: [address] [place], [place] [address] Location 2: [place], [place] [address] [place] ([place] speaking therapist available)(habla espanol)(take medicare and medicaid)  [place], [place], [place] [address], [place] [email or web address] [phone]  [facility]iat[facility] and Counseling [address] [place], [place] [address] [place], [place], [place] [address]      [phone]  [place] (spanish available) [place], [place] [address] [place] (take [facility] and medicare) [address] [place], [place] [address]       [phone]     [place] (virtual only) [phone]  Jinny Blossom [facility] [address], [place], [place]  Family Solutions:  [place]. [place] [phone]  [facility]:  [place] [address] [place] [phone]  [facility] (under & uninsured) [address] [place] [phone]    [email or web address]    [place] [address]  [place]    [phone]  [facility] [place]     Phone:  [phone]     [place]  [place] [address]      [place], [place] [address] [place]: [place], [place], [place]   [place] ([place] therapist) [email or web address]  [place] [address]   [place] [address]    [phone]    [facility]   [address],  [place], [place]   [place]  [place]  [facility]  [address] [place], [place]        [phone]  Open Access/Walk In Clinic under & uninsured  [facility]  [address] [place], [place] Crisis [phone]  [facility]  [facility] of the [place],  ([place])   [place], [place]: [phone]) 8:30 - 12; 1 - 2:30  Family [facility] of the [place],  [place], [place]    [phone]):8:30 - 12; 2 - 3PM  [facility] [employer],  [address],  [place]; [phone]):   Mon - Fri 8 AM - 5 PM  [facility] [place]  MWF 12:30 to 3:00 or call to schedule an appointment   [phone]  Specific Provider options Psychology Today  https://www.psychologytoday.com/us click on find a therapist  enter your zip code left side and select or tailor a therapist for your specific need.   [facility] Provider Directory [email or web address]  (Medicaid)   Follow all drop down to find a provider  [place] or [email or web address] [address], [place], [place] Recovery support and educational   24- Hour Availability:   [facility]  [address] [place], [place] Crisis [phone]  Family Service of the [employer] [phone]  [place] [facility]  [phone]   [place]  [phone] (after hours)  Therapeutic Alternative/Mobile Crisis   [phone]  [place] National Suicide Hotline  (520)[phone] Diamantina Monks)  Call 911 or go to emergency room  [facility]  [phone]);  [place] and [employer]  [phone]); [place], [place], [place], [place], [place], [place], [place]

## 2021-11-13 NOTE — Progress Notes (Signed)
SUBJECTIVE:   CHIEF COMPLAINT / HPI:  Chief Complaint  Patient presents with   Follow-up    Recently stopped his diabetic medications due to feeling "sick."  Last visit with pharmacy clinic 1 week ago. At that time: -Continued basal insulin glargine (insulin Basaglar) 15 units daily - HOLD  GLP-1 Trulicity (generic dulaglutide). - HOLD  SGLT2-I Farxiga (generic dapagliflozin). - HOLD  metformin. Restarted atorvastatin and Imdur at reduced dose 30 mg (1/2 tablet). CGM provided but not applied, they wanted to set up the app first.  Today, patient is accompanied by his wife Kathie Rhodes and daughter Nilda Simmer.  They have been able to obtain and set up the CGM.  Readings for the last week reviewed.  Blood sugar range from the low 200 to the mid 300s.  Fasting sugars range from the low to mid 200s.  He has been taking Basaglar 20 units without any issue.  Still holding Trulicity, empagliflozin, metformin.  Patient states that he was having more issues with feeling sick/nausea rather than dizziness.  Daughter also expresses some concern for depression as patient mostly stays at home all day and watches television, low interest in doing things.  Patient himself does not think he has any issues with depression.  He is also having some sore throat which has been ongoing for several weeks.  Denies any congestion, postnasal drip, indigestion, acid reflux.  PERTINENT  PMH / PSH: CAD s/p PCI, T2DM, HTN  Patient Care Team: Lennette Bihari, MD as PCP - General (Cardiology) Lennette Bihari, MD as PCP - Cardiology (Cardiology)   OBJECTIVE:   BP 124/87   Pulse 90   Ht 5\' 8"  (1.727 m)   Wt 241 lb 6.4 oz (109.5 kg)   SpO2 97%   BMI 36.70 kg/m   Physical Exam Constitutional:      General: He is not in acute distress.    Appearance: He is obese.  HENT:     Mouth/Throat:     Mouth: Mucous membranes are moist.     Pharynx: Oropharynx is clear.  Cardiovascular:     Rate and Rhythm: Normal rate  and regular rhythm.  Pulmonary:     Effort: Pulmonary effort is normal. No respiratory distress.     Breath sounds: Normal breath sounds.  Neurological:     Mental Status: He is alert.         11/13/2021   10:19 AM  Depression screen PHQ 2/9  Decreased Interest 3  Down, Depressed, Hopeless 3  PHQ - 2 Score 6  Altered sleeping 0  Tired, decreased energy 3  Change in appetite 0  Feeling bad or failure about yourself  0  Trouble concentrating 0  Moving slowly or fidgety/restless 0  Suicidal thoughts 0  PHQ-9 Score 9     {Show previous vital signs (optional):23777}    ASSESSMENT/PLAN:   Type 2 diabetes mellitus with complication (HCC) Uncontrolled likely secondary to medication nonadherence.  He was having some adverse reaction to one of his medications and currently we are slowly restarting some of his antidiabetic medications.  Adverse reaction I think is mostly GI upset, so I think we can safely resume low-dose dapagliflozin.  Fasting sugars are elevated but I think we should continue him on his current basal insulin dose, not make too many changes at once. - continue Basaglar 20 units - restart dapagliflozin at reduced dose 5 mg daily - continue to hold metformin, Trulicity - f/u in 2-4 weeks with myself  or pharmacy clinic, consider increasing basal insulin or restarting low-dose Trulicity at follow-up if still tolerating well   Sore throat Advised to trial Flonase for possible postnasal drip to see if this helps with the symptoms.  If not resolving, can consider antireflux medications.  Return in about 2 weeks (around 11/27/2021) for f/u T2DM meds.   Zola Button, MD [place]

## 2021-11-13 NOTE — Assessment & Plan Note (Addendum)
Uncontrolled likely secondary to medication nonadherence.  He was having some adverse reaction to one of his medications and currently we are slowly restarting some of his antidiabetic medications.  Adverse reaction I think is mostly GI upset, so I think we can safely resume low-dose dapagliflozin.  Fasting sugars are elevated but I think we should continue him on his current basal insulin dose, not make too many changes at once. - continue Basaglar 20 units - restart dapagliflozin at reduced dose 5 mg daily - continue to hold metformin, Trulicity - f/u in 2-4 weeks with myself or pharmacy clinic, consider increasing basal insulin or restarting low-dose Trulicity at follow-up if still tolerating well

## 2021-11-17 ENCOUNTER — Encounter: Payer: Self-pay | Admitting: Cardiovascular Disease

## 2021-11-17 ENCOUNTER — Ambulatory Visit: Payer: Commercial Managed Care - HMO | Attending: Cardiovascular Disease | Admitting: Cardiovascular Disease

## 2021-11-17 ENCOUNTER — Other Ambulatory Visit: Payer: Self-pay | Admitting: *Deleted

## 2021-11-17 DIAGNOSIS — I10 Essential (primary) hypertension: Secondary | ICD-10-CM

## 2021-11-17 DIAGNOSIS — I25119 Atherosclerotic heart disease of native coronary artery with unspecified angina pectoris: Secondary | ICD-10-CM

## 2021-11-17 DIAGNOSIS — E785 Hyperlipidemia, unspecified: Secondary | ICD-10-CM

## 2021-11-17 DIAGNOSIS — E118 Type 2 diabetes mellitus with unspecified complications: Secondary | ICD-10-CM

## 2021-11-17 DIAGNOSIS — Z9861 Coronary angioplasty status: Secondary | ICD-10-CM

## 2021-11-17 DIAGNOSIS — I2510 Atherosclerotic heart disease of native coronary artery without angina pectoris: Secondary | ICD-10-CM | POA: Diagnosis not present

## 2021-11-17 DIAGNOSIS — E669 Obesity, unspecified: Secondary | ICD-10-CM

## 2021-11-17 NOTE — Patient Instructions (Addendum)
Medication Instructions:    Restart taking Zetia ( Ezetimibe) 10 mg  one tablet daily  Continue all other medications  *If you need a refill on your cardiac medications before your next appointment, please call your pharmacy*   Lab Work:  In 3  to 4 months (Jan or Feb 2024) fasting Lipid CMP    If you have labs (blood work) drawn today and your tests are completely normal, you will receive your results only by: [place] (if you have MyChart) OR A paper copy in the mail If you have any lab test that is abnormal or we need to change your treatment, we will call you to review the results.   Testing/Procedures: Not needed   Follow-Up: At [facility], you and your health needs are our priority.  As part of our continuing mission to provide you with exceptional heart care, we have created designated Provider Care Teams.  These Care Teams include your primary Cardiologist (physician) and Advanced Practice Providers (APPs -  Physician Assistants and Nurse Practitioners) who all work together to provide you with the care you need, when you need it.  We recommend signing up for the patient portal called "MyChart".  Sign up information is provided on this After Visit Summary.  MyChart is used to connect with patients for Virtual Visits (Telemedicine).  Patients are able to view lab/test results, encounter notes, upcoming appointments, etc.  Non-urgent messages can be sent to your provider as well.   To learn more about what you can do with MyChart, go to [email or web address].    Your next appointment:   6 month(s)  The format for your next appointment:   In Person  Provider:   Shelva Majestic, MD

## 2021-11-17 NOTE — Progress Notes (Signed)
Cardiology Office Note    Date:  11/21/2021   ID:  Keith Newton, DOB 1955-08-23, MRN [id number]  PCP:  Troy Sine, MD  Cardiologist:  Shelva Majestic, MD    19 months F/U office visit with me  History of Present Illness:  Keith Newton is a 66 y.o. male who I saw for his initial post hospital evaluation in January 2021.  I last saw him in March 2022.  He presents for follow-up cardiology evaluation after recently undergoing nuclear medicine PET/CT cardiac perfusion study.  Mr Chiriboga has a history of diabetes mellitus, obesity, and was hospitalized on December 23, 2018 after awakening from sleep with severe chest pain.  I had seen him for initial evaluation and blood pressure on presentation was 181/114 with initial high-sensitivity troponin at 4628.  He underwent emergent cardiac catheterization by Dr. Irish Lack and was found to have an 80% proximal gnosis followed by total occlusion of the RCA.  He underwent successful stenting with tandem 3.0x16 and 2.75 x 38 mm Synergy stents.  He had concomitant CAD with 40% proximal LAD stenosis, 25% mid LAD stenosis, 50% mid circumflex stenosis and 75% ostial PDA stenosis.  An echo Doppler study showed preserved global LV function with EF 55 to 60%.  There was moderate LVH and basal inferior hypokinesis.  The patient has been seen by Kerin Ransom, PA-C, on 2 occasions following his hospitalization with his last visit in November 2020.    I saw him for initial evaluation with me on March 01, 2019.  He had lost almost 30 pounds since his myocardial infarction.  He denied any definitive anginal type symptoms but at times does experience a vague chest sensation.  Of note, he was awakened from sleep with his initial MI.  He also admits that he continues to have difficulty with sleep and at times he feels that he cannot get enough air while sleeping.  He does admit to snoring, frequent awakenings, and nocturia at least 3 times per night.  He also has  noticed that his pulse rate tends to be increased and at times increases to over 100 in a regular rhythm.  During that evaluation, I discussed with him my concern that his initial event awakened him from a sound sleep and was concerned about obstructive sleep apnea contributing to nocturnal hypoxemia contributing to his acute coronary syndrome.  He had a telemedicine evaluation by Kerin Ransom, [facility] on on April 27, 2019.  He had noted some mild intermittent chest pains that were not exertionally precipitated and also complained of some numbness in his feet.  He was told to take Imdur in the morning instead of at bedtime.  I saw him in April 2021 which times he was experiencing rare nonexertional episodes of chest discomfort.  At times it can last up to 20 to 25 minutes and then resolved.  He has continued to be on isosorbide 30 mg in addition to metoprolol tartrate 50 mg twice a day for anti-ischemic medication.  He was on aspirin and Brilinta for DAPT.  He was on atorvastatin 80 mg and LDL cholesterol was 55 on March 01 2019. He is diabetic on Tresiba, Farxiga and Metformin.    I saw him for preoperative cardiology clearance prior to undergoing elective soldier surgery. He had a Myoview study on November 17, 2019 which was low risk with a nuclear EF of 53% and normal wall motion.  He was evaluated  in December 2021 and was  cleared to undergo right  shoulder surgery.  With the surge in Covid, this elective surgery was canceled.  He was recently seen on April 23, 2020 by Kerin Ransom and had complaints of an intermittent chest sensation.  He was unable to exercise secondary to neuropathy.  He walks to the mailbox and back without discomfort.  He was referred for a [place] study.   The formal report was interpreted as intermediate study with prior MI with peri-infarct ischemia with concern for possible defect in the very basal septal area.   I last saw him on May 06, 2020 at which time he remained  stable and denied any chest pain or shortness of breath.  He had experienced was a transient fluttering in his chest and not chest tightness.  This fluttering sensation is completely different from his MI pain when he was found to have total occlusion of his RCA.  At the time he had mild concomitant CAD with 75% PDA stenosis.  EF was 55 to 60% with basal hypokinesis by echo.    He was evaluated by Fabian Sharp, PA on Jun 26, 2021 at which time he was still having some intermittent chest pain.  He had stopped all of his cardiac medications because he did not feel well.  He was advised to gradually resume his cardiac medications in a stepwise fashion.  He presented to the emergency room on Jul 11, 2021 with elevated blood pressure.  Troponins were negative.  At that time he was restarted on beta-blocker therapy.  He was last evaluated in our office on July 29, 2021 by Almyra Deforest.  He was here with his daughter.  Apparently the patient had noticed some vague chest pain and also some dizziness.  Apparently he was taking his old metoprolol 75 mg twice a day and he was advised to decrease this down to 50 mg twice a day.  He was scheduled to undergo a PET stress test for further evaluation.  On November 04, 2021 he underwent a nuclear medicine PET/CT cardiac perfusion study.  This study was normal without evidence for scar or ischemia.  Resting LV function EF was 55% with stress EF 61%.  There was no evidence for transient ischemic dilatation.  Myocardial blood flow was considered normal.  Presently, he feels well.  He is now on a medic regimen of aspirin 81 mg daily, ticagrelor, isosorbide 30 mg, Farxiga 5 mg, metoprolol tartrate 50 mg twice a day, Tresiba insulin and  atorvastatin 80 mg.  Previously he was on Zetia 10 mg.  LDL cholesterol in January 2023 was 58.  He presents to the office today.  His daughter was on the phone throughout the evaluation and involved in the assessment.  Past Medical History:   Diagnosis Date   Arthritis    Back pain    CAD (coronary artery disease)    a. s/p NSTEMI in 12/2018 with 80% Prox-RCA stenosis followed by 100% prox to mid-RCA stenosis --> DESx2 to the RCA. Med management of residual disease.    Carpal tunnel syndrome    DDD (degenerative disc disease)    Diabetes mellitus    type 2   Hyperlipidemia    Hypertension    states very mild   Low testosterone    Myocardial infarction (HCC)    Overweight    Seasonal allergies    Tachycardia     Past Surgical History:  Procedure Laterality Date   CARDIAC CATHETERIZATION     CARPAL TUNNEL  RELEASE  07/02/2011   Procedure: CARPAL TUNNEL RELEASE;  Surgeon: Cammie Sickle., MD;  Location: [place];  Service: Orthopedics;  Laterality: Right;   CARPAL TUNNEL RELEASE  07/30/2011   Procedure: CARPAL TUNNEL RELEASE;  Surgeon: Cammie Sickle., MD;  Location: [place];  Service: Orthopedics;  Laterality: Left;   CORONARY ANGIOPLASTY     CORONARY STENT INTERVENTION N/A 12/23/2018   Procedure: CORONARY STENT INTERVENTION;  Surgeon: Jettie Booze, MD;  Location: [place] [facility];  Service: Cardiovascular;  Laterality: N/A;   LEFT HEART CATH AND CORONARY ANGIOGRAPHY N/A 12/23/2018   Procedure: LEFT HEART CATH AND CORONARY ANGIOGRAPHY;  Surgeon: Jettie Booze, MD;  Location: [place] [facility];  Service: Cardiovascular;  Laterality: N/A;   LUMBAR EPIDURAL INJECTION     x2   SHOULDER ARTHROSCOPY WITH OPEN ROTATOR CUFF REPAIR AND DISTAL CLAVICLE ACROMINECTOMY Right 06/03/2020   Procedure: right shoulder arthroscopy, biceps tenodesis, mini open rotator cuff tear repair subscap/supraspinatus, lateral debridement;  Surgeon: Meredith Pel, MD;  Location: [place];  Service: Orthopedics;  Laterality: Right;    Current Medications: Outpatient Medications Prior to Visit  Medication Sig Dispense Refill   acetaminophen (TYLENOL) 325 MG tablet Take 2 tablets (650 mg  total) by mouth every 6 (six) hours as needed.     aspirin EC 81 MG EC tablet Take 1 tablet (81 mg total) by mouth daily.     atorvastatin (LIPITOR) 80 MG tablet Take 1 tablet (80 mg total) by mouth daily. 90 tablet 3   Blood Glucose Monitoring Suppl (ONETOUCH VERIO IQ SYSTEM) w/Device KIT Check fasting blood glucose in AM and before giving insulin. 1 kit 1   Continuous Blood Gluc Sensor (FREESTYLE LIBRE 3 SENSOR) MISC 1 each by Does not apply route every 14 (fourteen) days. Place 1 sensor on the skin every 14 days. Use to check glucose continuously 2 each 11   dapagliflozin propanediol (FARXIGA) 5 MG TABS tablet Take 1 tablet (5 mg total) by mouth daily. 30 tablet 2   Elastic Bandages & Supports (MEDICAL COMPRESSION STOCKINGS) MISC Wear daily 2 each 1   ezetimibe (ZETIA) 10 MG tablet Take 1 tablet (10 mg total) by mouth daily. 90 tablet 3   fluticasone (FLONASE) 50 MCG/ACT nasal spray Place 1 spray into both nostrils daily. 1 spray in each nostril every day 16 g 2   glucose blood (ONETOUCH VERIO) test strip Use as instructed 100 each 12   Insulin Glargine (BASAGLAR KWIKPEN) 100 UNIT/ML Inject 40 Units into the skin daily. 15 mL 2   Insulin Pen Needle (NOVOFINE PLUS PEN NEEDLE) 32G X 4 MM MISC USE WITH TRESIBA DAILY 100 each 6   isosorbide mononitrate (IMDUR) 30 MG 24 hr tablet Take 1 tablet (30 mg total) by mouth daily. 90 tablet 3   Lancet Devices (ONE TOUCH DELICA LANCING DEV) MISC Check fasting blood glucose in AM and before giving insulin. 1 each 11   metoprolol tartrate (LOPRESSOR) 50 MG tablet Take 1 tablet (50 mg total) by mouth 2 (two) times daily. 180 tablet 3   nitroGLYCERIN (NITROSTAT) 0.4 MG SL tablet Place 1 tablet (0.4 mg total) under the tongue every 5 (five) minutes x 3 doses as needed for chest pain. 25 tablet 3   OneTouch Delica Lancets 95M MISC Check fasting blood glucose in AM and before giving insulin. 100 each 11   pregabalin (LYRICA) 50 MG capsule TAKE 1 CAPSULE BY MOUTH 3  TIMES DAILY AS NEEDED. 90 capsule 0   ticagrelor (BRILINTA) 90 MG TABS tablet Take 1 tablet (90 mg total) by mouth 2 (two) times daily. 180 tablet 3   Dulaglutide (TRULICITY) 1.5 WU/9.8JX SOPN Inject 0.75 mg into the skin once a week. (Patient not taking: Reported on 11/17/2021) 2 mL 0   metFORMIN (GLUCOPHAGE) 1000 MG tablet TAKE 1 TABLET BY MOUTH TWICE A DAY (Patient not taking: Reported on 11/17/2021) 180 tablet 1   No facility-administered medications prior to visit.     Allergies:   Codeine   Social History   Socioeconomic History   Marital status: Married    Spouse name: Tyrone Pautsch   Number of children: 2   Years of education: 16   Highest education level: Bachelor's degree (e.g., BA, AB, BS)  Occupational History   Occupation: retired  Tobacco Use   Smoking status: Never   Smokeless tobacco: Never  Vaping Use   Vaping Use: Never used  Substance and Sexual Activity   Alcohol use: No   Drug use: No   Sexual activity: Not on file  Other Topics Concern   Not on file  Social History Narrative   Not on file   Social Determinants of Health   Financial Resource Strain: High Risk (06/27/2021)   Overall Financial Resource Strain (CARDIA)    Difficulty of Paying Living Expenses: Hard  Food Insecurity: Food Insecurity Present (07/21/2021)   Hunger Vital Sign    Worried About Running Out of Food in the Last Year: Often true    Ran Out of Food in the Last Year: Often true  Transportation Needs: No Transportation Needs (06/27/2021)   PRAPARE - Hydrologist (Medical): No    Lack of Transportation (Non-Medical): No  Physical Activity: Inactive (02/14/2019)   Exercise Vital Sign    Days of Exercise per Week: 0 days    Minutes of Exercise per Session: 0 min  Stress: No Stress Concern Present (02/14/2019)   [place]    Feeling of Stress : Only a little  Social Connections: Not on file      Family History:  The patient's family history includes CAD in his father, maternal grandfather, and paternal grandfather; Stroke in his mother.   ROS General: Negative; No fevers, chills, or night sweats;  HEENT: Negative; No changes in vision or hearing, sinus congestion, difficulty swallowing Pulmonary: Negative; No cough, wheezing, shortness of breath, hemoptysis Cardiovascular: See HPI GI: Negative; No nausea, vomiting, diarrhea, or abdominal pain GU: Negative; No dysuria, hematuria, or difficulty voiding Musculoskeletal: Negative; no myalgias, joint pain, or weakness Hematologic/Oncology: Negative; no easy bruising, bleeding Endocrine: Positive for diabetes Neuro: Negative; no changes in balance, headaches Skin: Negative; No rashes or skin lesions Psychiatric: Negative; No behavioral problems, depression Sleep: Poor sleep with frequent awakenings, snoring, and at times a sensation that he cannot get enough breath.  No bruxism, restless legs, hypnogognic hallucinations, no cataplexy Other comprehensive 14 point system review is negative.   PHYSICAL EXAM:   VS:  BP 126/78   Pulse 84   Ht _0  (1.727 m)   Wt 240 lb 9.6 oz (109.1 kg)   SpO2 94%   BMI 36.58 kg/m     Repeat blood pressure by me was 114/78  Wt Readings from Last 3 Encounters:  11/17/21 240 lb 9.6 oz (109.1 kg)  11/13/21 241 lb 6.4 oz (109.5 kg)  11/05/21 239 lb 12.8 oz (  108.8 kg)    General: Alert, oriented, no distress.  Skin: normal turgor, no rashes, warm and dry HEENT: Normocephalic, atraumatic. Pupils equal round and reactive to light; sclera anicteric; extraocular muscles intact;  Nose without nasal septal hypertrophy Mouth/Parynx benign; Mallinpatti scale 3 Neck: No JVD, no carotid bruits; normal carotid upstroke Lungs: clear to ausculatation and percussion; no wheezing or rales Chest wall: without tenderness to palpitation Heart: PMI not displaced, RRR, s1 s2 normal, 1/6 systolic murmur, no  diastolic murmur, no rubs, gallops, thrills, or heaves Abdomen: soft, nontender; no hepatosplenomehaly, BS+; abdominal aorta nontender and not dilated by palpation. Back: no CVA tenderness Pulses 2+ Musculoskeletal: full range of motion, normal strength, no joint deformities Extremities: no clubbing cyanosis or edema, Homan's sign negative  Neurologic: grossly nonfocal; Cranial nerves grossly wnl Psychologic: Normal mood and affect  Studies/Labs Reviewed:   November 17, 2021 ECG (independently read by me): NSR at 84, mild T wave abnormaity in III, aVF  May 06, 2020 ECG (independently read by me): NSR at 82; small inferior Q wave in III. No ectopy  June 02, 2019 ECG (independently read by me): Sinus rhythm with sinus arrhythmia.  Mild T wave abnormality inferiorly.  QTc interval 419 ms.  PR interval 160 ms.  March 01, 2019 ECG (independently read by me): Normal sinus rhythm at 96 bpm.  Q wave in lead III.  QTc interval 434 ms.  No ectopy.  Recent Labs:    Latest Ref Rng & Units 07/11/2021    7:31 AM 07/01/2021    3:28 PM 06/03/2020    7:19 AM  BMP  Glucose 70 - 99 mg/dL 189  267  123   BUN 8 - 23 mg/dL _0 Creatinine 0.61 - 1.24 mg/dL 1.07  1.12  1.03   BUN/Creat Ratio 10 - 24  14    Sodium 135 - 145 mmol/L 137  138  139   Potassium 3.5 - 5.1 mmol/L 4.0  4.7  4.5   Chloride 98 - 111 mmol/L 103  97  108   CO2 22 - 32 mmol/L _1 Calcium 8.9 - 10.3 mg/dL 9.6  10.2  9.5         Latest Ref Rng & Units 03/01/2019   10:51 AM 12/25/2018    8:08 AM  Hepatic Function  Total Protein 6.0 - 8.5 g/dL 7.5  6.8   Albumin 3.8 - 4.8 g/dL 4.7  3.2   AST 0 - 40 IU/L 18  51   ALT 0 - 44 IU/L 23  25   Alk Phosphatase 39 - 117 IU/L 84  52   Total Bilirubin 0.0 - 1.2 mg/dL 0.8  1.0        Latest Ref Rng & Units 07/11/2021    7:31 AM 08/13/2020    9:49 AM 06/03/2020    7:19 AM  CBC  WBC 4.0 - 10.5 K/uL 5.9  6.3  6.0   Hemoglobin 13.0 - 17.0 g/dL 15.2  15.1  14.3    Hematocrit 39.0 - 52.0 % 47.9  46.9  45.4   Platelets 150 - 400 K/uL 281  330  306    Lab Results  Component Value Date   MCV 87.6 07/11/2021   MCV 85 08/13/2020   MCV 89.2 06/03/2020   Lab Results  Component Value Date   TSH 1.490 08/13/2020   Lab Results  Component Value Date   HGBA1C 10.8 (A)  10/31/2021     BNP No results found for: "BNP"  ProBNP No results found for: "PROBNP"   Lipid Panel     Component Value Date/Time   CHOL 194 11/05/2020 1545   TRIG 289 (H) 11/05/2020 1545   HDL 41 11/05/2020 1545   CHOLHDL 4.7 11/05/2020 1545   CHOLHDL 6.6 12/24/2018 0214   VLDL 25 12/24/2018 0214   LDLCALC 104 (H) 11/05/2020 1545   LDLDIRECT 58 02/21/2021 1007   LABVLDL 49 (H) 11/05/2020 1545     RADIOLOGY: NM PET CT CARDIAC PERFUSION MULTI W/ABSOLUTE BLOODFLOW  Result Date: 11/05/2021   The study is normal. The study is low risk.   LV perfusion is normal. There is no evidence of ischemia. There is no evidence of infarction.   Rest left ventricular function is normal. Rest EF: 55 %. Stress left ventricular function is normal. Stress EF: 61 %. End diastolic cavity size is normal. End systolic cavity size is normal. No evidence of transient ischemic dilation (TID) noted.   Myocardial blood flow was computed to be 0.64m/g/min at rest and 1.777mg/min at stress. Global myocardial blood flow reserve was 2.49 and was normal.   Coronary calcium assessment not performed due to prior revascularization.   Electronically signed by: GaElouise MunroeMD CLINICAL DATA:  This over-read does not include interpretation of cardiac or coronary anatomy or pathology. The Cardiac PET CT interpretation by the cardiologist is attached. COMPARISON:  None Available. FINDINGS: Vascular: Aortic atherosclerosis. No acute noncardiac vascular finding. Mediastinum/Nodes: With the visualized portions of the thorax there is no mediastinal or hilar adenopathy noting limited sensitivity for hilar adenopathy on  noncontrast examination. Distal esophagus is grossly unremarkable. Lungs/Pleura: Within the visualized portions of the chest there are no suspicious pulmonary nodules or masses, no focal airspace consolidation, and no pleural effusion or pneumothorax. Upper Abdomen: No acute abnormality. Musculoskeletal: Bilateral gynecomastia. Multilevel degenerative changes spine. IMPRESSION: No acute noncardiac finding. Aortic Atherosclerosis (ICD10-I70.0). Electronically Signed   By: JeDahlia Bailiff.D.   On: 11/04/2021 10:23     Additional studies/ records that were reviewed today include:   EMERGENT CATH/PCI: 12/23/2018 Prox - mid RCA-2 lesion is 100% stenosed. This was the culprit lesion. A drug-eluting stent was successfully placed using a STENT SYNERGY DES [id number] intervention, there is a 0% residual stenosis. Prox RCA-1 lesion is 80% stenosed. A drug-eluting stent was successfully placed using a STENT SYNERGY DES 3X16, overlapping the first stent. Post intervention, there is a 0% residual stenosis. RPDA lesion is 75% stenosed. This was a small vessel. Medical treatment planned. Mid Cx lesion is 50% stenosed. Mid LAD lesion is 40% stenosed. Mid LAD to Dist LAD lesion is 25% stenosed. The left ventricular systolic function is normal. LV end diastolic pressure is normal. The left ventricular ejection fraction is 55-65% by visual estimate. There is no aortic valve stenosis. Tortuosity in right subclavian did not allow stiffer Cordis XBRCA catheter to pass into ascending aorta. Medtronic Launcher catheter was able to pass from right radial approach.   Watch in 2HOak Continue aggressive secondary prevention.  He needs weight loss and DM control, high dose statin and beta blocker.    He is interested in learning more about exercise and healthy diet.  Recommend cardiac rehab.    DAPT for 1 year given ACS.    Intervention     ECHO 12/23/2018 IMPRESSIONS  1. Left ventricular ejection fraction,  by visual estimation, is 55 to 60%. The left ventricle has normal  function. There is moderately increased left ventricular hypertrophy. Basal inferior hypokinesis.  2. Global right ventricle has mildly reduced systolic function.The right ventricular size is normal. No increase in right ventricular wall thickness.  3. Left atrial size was normal.  4. Right atrial size was normal.  5. The mitral valve is normal in structure. No evidence of mitral valve regurgitation.  6. The tricuspid valve is normal in structure. Tricuspid valve regurgitation is trivial.  7. The aortic valve is tricuspid. Aortic valve regurgitation is not visualized.  8. The pulmonic valve was not well visualized. Pulmonic valve regurgitation is trivial.   NM PET CT CARDIAC PERFUSION: 11/04/2021   The study is normal. The study is low risk.   LV perfusion is normal. There is no evidence of ischemia. There is no evidence of infarction.   Rest left ventricular function is normal. Rest EF: 55 %. Stress left ventricular function is normal. Stress EF: 61 %. End diastolic cavity size is normal. End systolic cavity size is normal. No evidence of transient ischemic dilation (TID) noted.   Myocardial blood flow was computed to be 0.78m/g/min at rest and 1.729mg/min at stress. Global myocardial blood flow reserve was 2.49 and was normal.   Coronary calcium assessment not performed due to prior revascularization.   Electronically signed by: GaElouise MunroeMD  ASSESSMENT:    1. Coronary artery disease involving native coronary artery of native heart with angina pectoris (HCBailey  2. CAD -S/P PCI   3. Hyperlipidemia LDL goal <70   4. Primary hypertension   5. Type 2 diabetes mellitus with complication (HCC)   6. Obesity, Class II, BMI 35-39.9     PLAN:  Mr. GrTige Meass a pleasant [age]ear old gentleman who has a history of obesity, diabetes mellitus, and was awakened from sleep with severe chest pain on December 23, 2018.  He  underwent emergent catheterization which revealed total RCA occlusion which was successfully stented with tandem Synergy DES stents.  He had significant salvage of myocardium with  his initial echo Doppler study showed an EF of 55 to 60% with only a small area of basal inferior hypokinesis.  The patient has noticed increased heart rate since his MI despite being on metoprolol tartrate 25 mg twice a day.  When I initially saw him for follow-up in January 2021 ECG showed sinus rhythm at 96 bpm with a Q-wave in lead III.  He was hypertensive when his blood pressure was initially taken by the nurse but on repeat by me was significantly improved at 112/70.  At that time I recommended further titration of metoprolol to 50 mg twice a day and he continued to be on low-dose isosorbide mononitrate.  Continued medical therapy was recommended for his concomitant CAD.  The time I also discussed possible sleep evaluation with admitted to snoring, frequent awakenings, nocturia 3 times per night with difficulty in sleeping.  Although a sleep study was strongly recommended this was never pursued by the patient.  He had undergone nuclear medicine evaluations for preoperative shoulder surgery in March 2022.  Since I had seen him, he had had issues with blood pressure control when he had stopped taking his medications.  Most recently, his blood pressure has normalized on a regimen of isosorbide 30 mg, metoprolol tartrate 50 mg twice a day.  He is on atorvastatin 80 mg for hyperlipidemia.  He is diabetic and was on metformin but is now just on to see but insulin.  He continues to be  on aspirin/ticagrelor for his CAD and stents.  He had recently developed some recurrent chest discomfort leading to further evaluation with a nuclear medicine PET CT cardiac perfusion study.  I thoroughly reviewed this with him today as well as his daughter.  This study is normal and showed normal myocardial perfusion at rest and stress with normal LV  function with rest EF 55% and stress EF 61%.  His blood pressure presently is stable.  He believes he is sleeping adequately.  An Epworth scale was calculated today and this endorsed at 3 argue against daytime sleepiness.  I reviewed his findings in detail with his daughter.  He continues to be followed by Dr. Zola Button at [facility].  As long as he remains stable, I will see him in 6 months for follow-up evaluation or sooner as needed.    Medication Adjustments/Labs and Tests Ordered: Current medicines are reviewed at length with the patient today.  Concerns regarding medicines are outlined above.  Medication changes, Labs and Tests ordered today are listed in the Patient Instructions below. Patient Instructions  Medication Instructions:    Restart taking Zetia ( Ezetimibe) 10 mg  one tablet daily  Continue all other medications  *If you need a refill on your cardiac medications before your next appointment, please call your pharmacy*   Lab Work:  In 3  to 4 months (Jan or Feb 2024) fasting Lipid CMP    If you have labs (blood work) drawn today and your tests are completely normal, you will receive your results only by: [place] (if you have MyChart) OR A paper copy in the mail If you have any lab test that is abnormal or we need to change your treatment, we will call you to review the results.   Testing/Procedures: Not needed   Follow-Up: At [facility], you and your health needs are our priority.  As part of our continuing mission to provide you with exceptional heart care, we have created designated Provider Care Teams.  These Care Teams include your primary Cardiologist (physician) and Advanced Practice Providers (APPs -  Physician Assistants and Nurse Practitioners) who all work together to provide you with the care you need, when you need it.  We recommend signing up for the patient portal called "MyChart".  Sign up information is provided on  this After Visit Summary.  MyChart is used to connect with patients for Virtual Visits (Telemedicine).  Patients are able to view lab/test results, encounter notes, upcoming appointments, etc.  Non-urgent messages can be sent to your provider as well.   To learn more about what you can do with MyChart, go to [email or web address].    Your next appointment:   6 month(s)  The format for your next appointment:   In Person  Provider:   Shelva Majestic, MD       Signed, Shelva Majestic, MD  11/21/2021 9:53 AM    [facility] [address] [place], [place], [place]  [address] Phone: [phone]

## 2021-11-21 ENCOUNTER — Encounter: Payer: Self-pay | Admitting: Cardiovascular Disease

## 2021-11-27 ENCOUNTER — Ambulatory Visit (INDEPENDENT_AMBULATORY_CARE_PROVIDER_SITE_OTHER): Payer: Commercial Managed Care - HMO | Admitting: Pharmacist

## 2021-11-27 ENCOUNTER — Encounter: Payer: Self-pay | Admitting: Pharmacist

## 2021-11-27 ENCOUNTER — Ambulatory Visit (INDEPENDENT_AMBULATORY_CARE_PROVIDER_SITE_OTHER): Payer: Medicare Other | Admitting: Student

## 2021-11-27 VITALS — Ht 69.0 in | Wt 240.4 lb

## 2021-11-27 VITALS — BP 123/84 | HR 69 | Ht 68.0 in | Wt 240.4 lb

## 2021-11-27 DIAGNOSIS — E118 Type 2 diabetes mellitus with unspecified complications: Secondary | ICD-10-CM | POA: Diagnosis not present

## 2021-11-27 DIAGNOSIS — M542 Cervicalgia: Secondary | ICD-10-CM

## 2021-11-27 MED ORDER — CETIRIZINE HCL 10 MG PO TABS: 10.0000 mg | ORAL_TABLET | Freq: Every day | ORAL | 11 refills | Status: DC

## 2021-11-27 MED ORDER — TRULICITY 1.5 MG/0.5ML ~~LOC~~ SOAJ: 0.7500 mg | SUBCUTANEOUS | 3 refills | Status: DC

## 2021-11-27 NOTE — Assessment & Plan Note (Signed)
Diabetes longstanding currently uncontrolled based on A1c. Patient is able to verbalize appropriate hypoglycemia management plan. Medication adherence needs improvement. Control is suboptimal due to adherence and dietary habits. -Continued basal insulin Basaglar (insulin glargine) 20 units daily. (No change as patient admitted non-adherence with daily administration).  Encouraged patient to take insulin everyday.  -Restarted GLP-1 Trulicity (dulaglutide) 0.75 mg weekly.  -Continued SGLT2-I Farxiga (dapagliflozin) 5 mg daily. Instructed patient to pick up from pharmacy as he has not started yet. Counseled on sick day rules. -Continue to HOLD metformin 1000 mg BID.  -Extensively discussed pathophysiology of diabetes, recommended lifestyle interventions, dietary effects on blood sugar control.

## 2021-11-27 NOTE — Patient Instructions (Addendum)
It was nice to see you today!  Your goal blood sugar is 80-130 before eating and less than 180 after eating.  Medication Changes: Restart Trulicity 2.19 mg weekly. Continue Basaglar (insulin glargine) 20 units daily Pick up Farxiga 5 mg daily from the pharmacy and start this medication. Continue to hold metformin.   Monitor blood sugars at home and keep a log (glucometer or piece of paper) to bring with you to your next visit.  Keep up the good work with diet and exercise. Aim for a diet full of vegetables, fruit and lean meats (chicken, [place], fish). Try to limit salt intake by eating fresh or frozen vegetables (instead of canned), rinse canned vegetables prior to cooking and do not add any additional salt to meals.

## 2021-11-27 NOTE — Patient Instructions (Addendum)
It was great to see you today! Thank you for choosing [facility] for your primary care. Keith Newton was seen for left anterior neck pain.  Today we addressed: Continuing with a CT scan of the neck  Continue with Zyrtec and Flonase daily  Use Tylenol and Ibuprofen for pain control   If you haven't already, sign up for My Chart to have easy access to your labs results, and communication with your primary care physician.  I recommend that you always bring your medications to each appointment as this makes it easy to ensure you are on the correct medications and helps [place] not miss refills when you need them. Call the clinic at [phone] if your symptoms worsen or you have any concerns.  You should return to our clinic Return in about 4 weeks (around 12/25/2021) for anterior neck pain . Please arrive 15 minutes before your appointment to ensure smooth check in process.  We appreciate your efforts in making this happen.  Thank you for allowing me to participate in your care, Erskine Emery, MD 11/27/2021, 11:30 AM PGY-2, [place]

## 2021-11-27 NOTE — Progress Notes (Signed)
S:    Chief Complaint  Patient presents with   Medication Management    Diabetes   Keith Newton is a 66 y.o. male who presents for diabetes evaluation, education, and management.  PMH is significant for T2DM, HTN, HLD, CAD, and NSTEMI.  Patient was referred and last seen by Primary Care Provider, Dr. Nancy Fetter, on 11/13/2021.   At last visit, Farxiga (dapagliflozin) was restarted at a reduced dose (5 mg daily) and he was instructed to continue to hold metformin and Trulicity (dulaglutide) due to feeling sick.   Today, patient arrives in good spirits and presents without any assistance. Interview conducted with patient and patient's daughter on the phone.   Current diabetes medications include: Basaglar (insulin glargine) 20 units daily, Farxiga 5 mg (dapagliflozin)- not started new dose, Trulicity (dulaglutide) 0.75 mg weekly- holding, metformin 1000 mg BID- holding Current hyperlipidemia medications include: Zetia (ezetimibe) 10 mg daily, atorvastatin 80 mg daily  Patient denies adherence to taking all medications as prescribed. Reports missing insulin but cannot say how many times.   Insurance coverage: Cigna   Patient denies hypoglycemic events.  Reported home fasting blood sugars: not checking sine CGM came off last Wednesday.   Self reported CGM readings: mostly 200-300s, few in the 100s. None <150 mg/dL  Patient reports nocturia (nighttime urination).  Patient reports neuropathy (nerve pain)- feet. Patient denies visual changes. Patient denies self foot exams.   Patient reported dietary habits: Eats 'what ever he wants'.   Patient reports pain in left side of neck that has not resolved (seen by Dr. Nancy Fetter for same complaint 11/13/2021) Deferred assessment and management to Dr. Zigmund Daniel.   O:  Review of Systems  All other systems reviewed and are negative.   Physical Exam Constitutional:      Appearance: Normal appearance.  Neurological:     Mental Status: He is alert.   Psychiatric:        Mood and Affect: Mood normal.        Behavior: Behavior normal.        Thought Content: Thought content normal.    Lab Results  Component Value Date   HGBA1C 10.8 (A) 10/31/2021   Lipid Panel     Component Value Date/Time   CHOL 194 11/05/2020 1545   TRIG 289 (H) 11/05/2020 1545   HDL 41 11/05/2020 1545   CHOLHDL 4.7 11/05/2020 1545   CHOLHDL 6.6 12/24/2018 0214   VLDL 25 12/24/2018 0214   LDLCALC 104 (H) 11/05/2020 1545   LDLDIRECT 58 02/21/2021 1007   Clinical Atherosclerotic Cardiovascular Disease (ASCVD): Yes - h/o NSTEMI The ASCVD Risk score (Arnett DK, et al., 2019) failed to calculate for the following reasons:   The patient has a prior MI or stroke diagnosis   Patient was able to [employer] 3 sensor data - obtained when worn 9/20-9/27  CGM Download:  % Time CGM is active: only worn for 1 week  Average Glucose: 286 mg/dL Glucose Management Indicator: 10.2%  Glucose Variability: 17.1% (goal <36%) Time in Goal:  - Time in range 70-180: 0% - Time above range: 100% (74% very high > 250mg /dl) - Time below range: 0% Observed patterns: persistently high     A/P: Diabetes longstanding currently uncontrolled based on A1c. Patient is able to verbalize appropriate hypoglycemia management plan. Medication adherence needs improvement. Control is suboptimal due to adherence and dietary habits. -Continued basal insulin Basaglar (insulin glargine) 20 units daily. (No change as patient admitted non-adherence with daily administration).  Encouraged patient to take insulin everyday.  -Restarted GLP-1 Trulicity (dulaglutide) 0.75 mg weekly.  -Continued SGLT2-I Farxiga (dapagliflozin) 5 mg daily. Instructed patient to pick up from pharmacy as he has not started yet. Counseled on sick day rules. -Continue to HOLD metformin 1000 mg BID.  -Extensively discussed pathophysiology of diabetes, recommended lifestyle interventions, dietary effects on blood sugar  control.  -Counseled on s/sx of and management of hypoglycemia.  -Next A1c anticipated December 2023.   Neck pain deferred to Dr. Zigmund Daniel for evaluation and management.    Written patient instructions provided. Patient verbalized understanding of treatment plan.  Total time in face to face counseling 40 minutes.    Follow-up:  Pharmacist 01/01/2022. Patient seen with Geraldo Docker, PharmD Candidate and Joseph Art, PharmD, PGY2 Pharmacy Resident.

## 2021-11-28 ENCOUNTER — Other Ambulatory Visit: Payer: Self-pay

## 2021-11-28 DIAGNOSIS — M542 Cervicalgia: Secondary | ICD-10-CM | POA: Insufficient documentation

## 2021-11-28 DIAGNOSIS — E118 Type 2 diabetes mellitus with unspecified complications: Secondary | ICD-10-CM

## 2021-11-28 MED ORDER — TRULICITY 1.5 MG/0.5ML ~~LOC~~ SOAJ: 0.7500 mg | SUBCUTANEOUS | 3 refills | Status: DC

## 2021-11-28 NOTE — Assessment & Plan Note (Signed)
Ddx: radiation of pain from URI vs allergic rhinitis, MSK pain in muscles of mastication, lymphadenitis (no infectious symptoms or swelling noted on examination), thyroid abnormality(Not tender without deformity on examination and without thyroid symptoms), No evidence of jaw claudication or trismus, no evidence of dental pain but with poor dentition. Will image for further evaluation as I am unsure of cause of symptoms with prolonged course.  - CT soft tissue neck (Cr appropriate) - Continue with flonase and zyrtec in meantime  - strict return precautions

## 2021-11-28 NOTE — Progress Notes (Signed)
    SUBJECTIVE:   CHIEF COMPLAINT / HPI:   Anterior Neck pain: Pt presented to visit with Dr. Valentina Lucks with continued complaint of anterior neck pain on the left side onset 5 weeks ago. Pain radiates down the neck without swelling. No B symptoms reported by patient. No dental pain. Patient reports of associated congestion, runny nose. He trialed Flonase without relief. Never smoker, never used chewing tobacco.   PERTINENT  PMH / PSH: HTN, DM2, venous incompetence, HLD, NSTEMI  OBJECTIVE:   BP 123/84   Pulse 69   Ht 5\' 8"  (1.727 m)   Wt 240 lb 6.4 oz (109 kg)   SpO2 98%   BMI 36.55 kg/m   General: Alert and oriented in no apparent distress Physical Exam HENT:     Head:     Jaw: No trismus, tenderness, swelling or pain on movement.     Salivary Glands: Right salivary gland is not diffusely enlarged. Left salivary gland is not diffusely enlarged.      Nose: Nose normal.     Mouth/Throat:     Mouth: No injury or oral lesions.     Dentition: Abnormal dentition. Dental caries present. No dental tenderness or dental abscesses.     Tongue: No lesions.     Palate: No mass and lesions.     Pharynx: Uvula midline.     Tonsils: No tonsillar exudate or tonsillar abscesses.     Comments: TTP over area noted     Heart: Regular rate and rhythm with no murmurs appreciated Lungs: CTA bilaterally, no wheezing Abdomen: Bowel sounds present, no abdominal pain Skin: Warm and dry Extremities: No lower extremity edema   ASSESSMENT/PLAN:   Anterior neck pain Ddx: radiation of pain from URI vs allergic rhinitis, MSK pain in muscles of mastication, lymphadenitis (no infectious symptoms or swelling noted on examination), thyroid abnormality(Not tender without deformity on examination and without thyroid symptoms), No evidence of jaw claudication or trismus, no evidence of dental pain but with poor dentition. Will image for further evaluation as I am unsure of cause of symptoms with prolonged course.   - CT soft tissue neck (Cr appropriate) - Continue with flonase and zyrtec in meantime  - strict return precautions      Erskine Emery, MD [place]

## 2021-12-01 ENCOUNTER — Telehealth: Payer: Self-pay

## 2021-12-01 NOTE — Telephone Encounter (Signed)
Spoke with patients daughter. Informed of Ct scan at [facility] on Nov. 16th at 12:20pm 315 location. Patient is not to eat 4 hours piror to appt, but can have liquids. Patient daughter wrote down information and understood. Salvatore Marvel, CMA

## 2021-12-09 ENCOUNTER — Ambulatory Visit (HOSPITAL_COMMUNITY): Payer: Commercial Managed Care - HMO

## 2022-01-01 ENCOUNTER — Encounter: Payer: Self-pay | Admitting: Pharmacist

## 2022-01-01 ENCOUNTER — Ambulatory Visit (INDEPENDENT_AMBULATORY_CARE_PROVIDER_SITE_OTHER): Payer: Commercial Managed Care - HMO | Admitting: Pharmacist

## 2022-01-01 ENCOUNTER — Ambulatory Visit: Admission: RE | Admit: 2022-01-01 | Payer: Commercial Managed Care - HMO | Source: Ambulatory Visit

## 2022-01-01 VITALS — Ht 68.0 in | Wt 237.4 lb

## 2022-01-01 DIAGNOSIS — E118 Type 2 diabetes mellitus with unspecified complications: Secondary | ICD-10-CM

## 2022-01-01 DIAGNOSIS — Z9861 Coronary angioplasty status: Secondary | ICD-10-CM | POA: Diagnosis not present

## 2022-01-01 DIAGNOSIS — I2510 Atherosclerotic heart disease of native coronary artery without angina pectoris: Secondary | ICD-10-CM

## 2022-01-01 NOTE — Progress Notes (Signed)
Reviewed: I agree with Dr. Koval's documentation and management. 

## 2022-01-01 NOTE — Assessment & Plan Note (Signed)
Diabetes longstanding currently uncontrolled. Patient is able to verbalize appropriate hypoglycemia management plan. Medication adherence needs major improvement. Control is suboptimal due to adherence. -Continued basal insulin Basaglar (insulin glargine) 25 units daily for now. Will have patient take his blood glucose for 4-5 days and contact daughter to obtain readings. Will decide at that point if the insulin dose needs to be adjusted.  -Restarted GLP-1 Trulicity (dulaglutide) 0.75 mg weekly. Called [facility], prescription is ready for him to pick up. Patient plans to pick-up today and restart.  -Continued SGLT2-I Farxiga (dapagliflozin) 5 mg daily. Counseled on sick day rules. -Discontinued metformin 1000 mg BID per patient preference and to simplify regimen. -Extensively discussed pathophysiology of diabetes, recommended lifestyle interventions, dietary effects on blood sugar control.  -Counseled on s/sx of and management of hypoglycemia.

## 2022-01-01 NOTE — Assessment & Plan Note (Signed)
History of PCI - more than 12 months ago. Currently taking aspirin 81mg  and ticagrelor 90mg  BID - Reconsider dual antiplatelet therapy at next PCP/Cardiology follow-up

## 2022-01-01 NOTE — Progress Notes (Signed)
S:    Chief Complaint  Patient presents with   Medication Management    Diabetes   Keith Newton is a 66 y.o. male who presents for diabetes evaluation, education, and management.  PMH is significant for T2DM, HTN, HLD, CAD, and NSTEMI.  Patient was referred and last seen by Primary Care Provider, Dr. Wynelle Link, on 11/13/2021. Last seen by pharmacy clinic on 11/27/2021.  At last visit, patient was restarted on Trulicity 0.75 mg weekly, told to pick up Farxiga 5 mg from the pharmacy, and continue to hold metformin.   Today, patient arrives in fair spirits and presents without any assistance. His daughter is on the phone for his visit. She manages his medications and she will be moving in with the patient next month. He did not pick up Trulicity after his last visit as it was not ready at the pharmacy. He is unable to afford a CGM and has not been checking BG for ~3 weeks.   Current diabetes medications include: Farxiga (dapagliflozin) 5 mg daily, Basaglar (insulin glargine) 25 units daily, Trulicity (dulaglutide) 0.75 mg weekly (not started)  Patient denies adherence to medications. Often misses his insulin dose although his daughter sets Alexa reminders 3x/day. He never restarted Trulicity.   Insurance coverage: Cigna, [place]  Patient denies hypoglycemic events.  Reported home fasting blood sugars: not checking   Patient reports nocturia (nighttime urination). 2x/night Patient reports neuropathy (nerve pain)- feet. Patient denies visual changes. Patient reports self foot exams.   O:  Review of Systems  All other systems reviewed and are negative.   Physical Exam Constitutional:      Appearance: Normal appearance.  Pulmonary:     Effort: Pulmonary effort is normal.  Neurological:     Mental Status: He is alert.    7 day average blood glucose: not checking    Lab Results  Component Value Date   HGBA1C 10.8 (A) 10/31/2021    Lipid Panel     Component Value Date/Time    CHOL 194 11/05/2020 1545   TRIG 289 (H) 11/05/2020 1545   HDL 41 11/05/2020 1545   CHOLHDL 4.7 11/05/2020 1545   CHOLHDL 6.6 12/24/2018 0214   VLDL 25 12/24/2018 0214   LDLCALC 104 (H) 11/05/2020 1545   LDLDIRECT 58 02/21/2021 1007    Clinical Atherosclerotic Cardiovascular Disease (ASCVD): Yes     A/P: Diabetes longstanding currently uncontrolled. Patient is able to verbalize appropriate hypoglycemia management plan. Medication adherence needs major improvement. Control is suboptimal due to adherence. -Continued basal insulin Basaglar (insulin glargine) 25 units daily for now. Will have patient take his blood glucose for 4-5 days and contact daughter to obtain readings. Will decide at that point if the insulin dose needs to be adjusted.  -Restarted GLP-1 Trulicity (dulaglutide) 0.75 mg weekly. [facility], prescription is ready for him to pick up. Patient plans to pick-up today and restart.  -Continued SGLT2-I Farxiga (dapagliflozin) 5 mg daily. Counseled on sick day rules. -Discontinued metformin 1000 mg BID per patient preference and to simplify regimen. -Extensively discussed pathophysiology of diabetes, recommended lifestyle interventions, dietary effects on blood sugar control.  -Counseled on s/sx of and management of hypoglycemia.  -Next A1c anticipated December 2024.   History of PCI - more than 12 months ago. Currently taking aspirin 81mg  and ticagrelor 90mg  BID - Reconsider dual antiplatelet therapy at next PCP/Cardiology follow-up  Written patient instructions provided. Patient verbalized understanding of treatment plan.  Total time in face to face counseling 35 minutes.  Follow-up:  Pharmacist 01/22/2022. Patient seen with Geraldo Docker, PharmD Candidate, [place],  PharmD Candidate.and Joseph Art, PharmD, PGY2 Pharmacy Resident.

## 2022-01-01 NOTE — Patient Instructions (Signed)
It was nice to see you today!  Your goal blood sugar is 80-130 before eating and less than 180 after eating.  Medication Changes: [facility]   Continue Basaglar 25 units daily. Please check your blood sugar every day before eating and 2 hours after your largest meal. I will call your daughter in a few days for her to share the readings and we will decide then if we need to adjust your insulin dose.   Monitor blood sugars at home and keep a log (glucometer or piece of paper) to bring with you to your next visit.  Keep up the good work with diet and exercise. Aim for a diet full of vegetables, fruit and lean meats (chicken, [place], fish). Try to limit salt intake by eating fresh or frozen vegetables (instead of canned), rinse canned vegetables prior to cooking and do not add any additional salt to meals.

## 2022-01-14 ENCOUNTER — Telehealth: Payer: Self-pay

## 2022-01-14 NOTE — Telephone Encounter (Signed)
Attempted to contact patient's daughter regarding Mr. Segoviano's recent blood sugar readings. Unable to reach and a HIPAA compliant VM was left requesting a return call.   Valeda Malm, Pharm.D. PGY-2 Ambulatory Care Pharmacy Resident 01/14/2022 3:39 PM

## 2022-01-22 ENCOUNTER — Encounter: Payer: Self-pay | Admitting: Pharmacist

## 2022-01-22 ENCOUNTER — Ambulatory Visit (INDEPENDENT_AMBULATORY_CARE_PROVIDER_SITE_OTHER): Payer: Commercial Managed Care - HMO | Admitting: Pharmacist

## 2022-01-22 VITALS — BP 110/71 | HR 65 | Wt 235.8 lb

## 2022-01-22 DIAGNOSIS — E1142 Type 2 diabetes mellitus with diabetic polyneuropathy: Secondary | ICD-10-CM

## 2022-01-22 DIAGNOSIS — I25110 Atherosclerotic heart disease of native coronary artery with unstable angina pectoris: Secondary | ICD-10-CM | POA: Diagnosis not present

## 2022-01-22 DIAGNOSIS — E118 Type 2 diabetes mellitus with unspecified complications: Secondary | ICD-10-CM | POA: Diagnosis not present

## 2022-01-22 DIAGNOSIS — G5601 Carpal tunnel syndrome, right upper limb: Secondary | ICD-10-CM | POA: Insufficient documentation

## 2022-01-22 DIAGNOSIS — I2510 Atherosclerotic heart disease of native coronary artery without angina pectoris: Secondary | ICD-10-CM

## 2022-01-22 MED ORDER — PREGABALIN 50 MG PO CAPS: ORAL_CAPSULE | ORAL | 3 refills | Status: DC

## 2022-01-22 MED ORDER — ISOSORBIDE MONONITRATE ER 30 MG PO TB24: 30.0000 mg | ORAL_TABLET | Freq: Every day | ORAL | 3 refills | Status: DC

## 2022-01-22 MED ORDER — DICLOFENAC SODIUM 1 % EX GEL: 2.0000 g | Freq: Four times a day (QID) | CUTANEOUS | 3 refills | Status: DC

## 2022-01-22 MED ORDER — METOPROLOL SUCCINATE ER 100 MG PO TB24: 100.0000 mg | ORAL_TABLET | Freq: Every day | ORAL | 3 refills | Status: DC

## 2022-01-22 NOTE — Progress Notes (Signed)
Reviewed: I agree with Dr. Koval's documentation and management. 

## 2022-01-22 NOTE — Assessment & Plan Note (Signed)
Diabetes longstanding currently uncontrolled per A1c; however, home blood glucose readings are close to goal. Patient is able to verbalize appropriate hypoglycemia management plan. Medication adherence has improved. -Continued basal insulin Basaglar (insulin glargine) 25 units daily. Will need to assess if a reduced dose is required if/when Trulicity is titrated.  -Continue GLP-1 Trulicity (dulaglutide) 0.75 mg weekly. Patient took first dose yesterday.  -Continued SGLT2-I Farxiga (dapagliflozin) 5 mg daily. Counseled on sick day rules. Consider dose increase to 10mg  daily at next refill.  -Extensively discussed pathophysiology of diabetes, recommended lifestyle interventions, dietary effects on blood sugar control.  -Counseled on s/sx of and management of hypoglycemia.  -Next A1c anticipated at PCP visit.

## 2022-01-22 NOTE — Patient Instructions (Addendum)
It was nice to see you today!  Your goal blood sugar is 80-130 before eating and less than 180 after eating.  Medication Changes: Begin Voltaren gel four times daily as needed for pain on your right wrist Start metoprolol succinate 100 mg daily. STOP taking metoprolol tartrate 50 mg twice daily.  We are refilling your Lyrica and Imdur.   Monitor blood sugars at home and keep a log (glucometer or piece of paper) to bring with you to your next visit.  Keep up the good work with diet and exercise. Aim for a diet full of vegetables, fruit and lean meats (chicken, [place], fish). Try to limit salt intake by eating fresh or frozen vegetables (instead of canned), rinse canned vegetables prior to cooking and do not add any additional salt to meals.

## 2022-01-22 NOTE — Progress Notes (Signed)
S:    Chief Complaint  Patient presents with   Medication Management    Diabetes   Keith Newton is a 66 y.o. male who presents for diabetes evaluation, education, and management. PMH is significant for T2DM, HTN, HLD, CAD, and NSTEMI.  Patient was referred and last seen by Primary Care Provider, Dr. Wynelle Link, on 11/13/2021. Last seen by pharmacy clinic on 01/01/2022.  At last visit, patient was instructed to pick up Trulicity (dulaglutide) from the pharmacy and to start checking his blood sugar to determine if his insulin dose needed to be adjusted.   Today, patient arrives in fair spirits and presents without any assistance. States he feels the same since last visit. However, blood sugar readings have improved to the majority of readings being in the 100s. States he picked up and had his first dose of Trulicity yesterday. Reports pain in thumb, h/o carpal tunnel (had surgery several years ago) and endorses neuropathy has gotten worse as he has not been on Lyrica (pregabalin) for many months.   Current diabetes medications include: Farxiga (dapagliflozin) 5 mg daily, Basaglar (insulin glargine) 25 units daily, Trulicity (dulaglutide) 0.75 mg weekly (started yesterday)   Patient reports adherence has improved. Occasionally misses second dose of metoprolol.   Insurance coverage: C[employer]a, [place]  Patient denies hypoglycemic events.  Reported home fasting blood sugars: 134-244, most readings in the 100s. Two readings in the 200s  Patient reports nocturia (nighttime urination). 2x/night Patient reports neuropathy (nerve pain)- feet. Patient denies visual changes. Patient reports self foot exams.   O:  Review of Systems  Musculoskeletal:  Positive for joint pain (R wrist/thumb).  Neurological:  Positive for tingling (worsening neuropathy).  All other systems reviewed and are negative.  Physical Exam Constitutional:      Appearance: Normal appearance.  Pulmonary:     Effort: Pulmonary  effort is normal.  Neurological:     Mental Status: He is alert.  Psychiatric:        Behavior: Behavior normal.        Thought Content: Thought content normal.    Lab Results  Component Value Date   HGBA1C 10.8 (A) 10/31/2021    Lipid Panel     Component Value Date/Time   CHOL 194 11/05/2020 1545   TRIG 289 (H) 11/05/2020 1545   HDL 41 11/05/2020 1545   CHOLHDL 4.7 11/05/2020 1545   CHOLHDL 6.6 12/24/2018 0214   VLDL 25 12/24/2018 0214   LDLCALC 104 (H) 11/05/2020 1545   LDLDIRECT 58 02/21/2021 1007    Clinical Atherosclerotic Cardiovascular Disease (ASCVD): Yes   A/P: Diabetes longstanding currently uncontrolled per A1c; however, home blood glucose readings are close to goal. Patient is able to verbalize appropriate hypoglycemia management plan. Medication adherence has improved. -Continued basal insulin Basaglar (insulin glargine) 25 units daily. Will need to assess if a reduced dose is required if/when Trulicity is titrated.  -Continue GLP-1 Trulicity (dulaglutide) 0.75 mg weekly. Patient took first dose yesterday.  -Continued SGLT2-I Farxiga (dapagliflozin) 5 mg daily. Counseled on sick day rules. Consider dose increase to 10mg  daily at next refill.  -Extensively discussed pathophysiology of diabetes, recommended lifestyle interventions, dietary effects on blood sugar control.  -Counseled on s/sx of and management of hypoglycemia.  -Next A1c anticipated at PCP visit.   History of PCI - more than 12 months ago. Currently taking aspirin 81mg  and ticagrelor 90mg  BID - Reconsider dual antiplatelet therapy at next PCP/Cardiology follow-up  CAD - patient having difficulty remembering to take second  dose of metoprolol tartrate. -Discontinue metoprolol tartrate 50 mg BID. -Start metoprolol succinate 100 mg daily to reduce pill burden and improve adherence.  -Refill send for Imdur (isosorbide mononitrate) 30 mg daily.   Right wrist/thumb pain- recent worsening of pain;  history of carpal tunnel syndrome. Patient currently using maximum daily dose of acetaminophen.  -Start Voltaren gel (diclofenac) 1% gel -Continue acetaminophen BID -Reevaluate with Dr. Wynelle Link at next PCP visit.   Written patient instructions provided. Patient verbalized understanding of treatment plan.  Total time in face to face counseling 25 minutes.    Follow-up:  Pharmacist PRN PCP visit in January 2024 Patient seen with Valeda Malm, PharmD, PGY2 Pharmacy Resident.

## 2022-01-22 NOTE — Assessment & Plan Note (Signed)
Right wrist/thumb pain- recent worsening of pain; history of carpal tunnel syndrome. Patient currently using maximum daily dose of acetaminophen.  -Start Voltaren gel (diclofenac) 1% gel -Continue acetaminophen BID -Reevaluate with Dr. Wynelle Link at next PCP visit.

## 2022-01-22 NOTE — Assessment & Plan Note (Signed)
CAD - patient having difficulty remembering to take second dose of metoprolol tartrate. -Discontinue metoprolol tartrate 50 mg BID. -Start metoprolol succinate 100 mg daily to reduce pill burden and improve adherence.  -Refill send for Imdur (isosorbide mononitrate) 30 mg daily.

## 2022-02-16 ENCOUNTER — Other Ambulatory Visit: Payer: Self-pay | Admitting: Family Medicine

## 2022-03-02 NOTE — Patient Instructions (Incomplete)
It was nice seeing you today!  Blood work today.  See me in 3 months or whenever is a good for you.  Stay well, [name], MD [facility] [phone]  --  Make sure to check out at the front desk before you leave today.  Please arrive at least 15 minutes prior to your scheduled appointments.  If you had blood work today, I will send you a MyChart message or a letter if results are normal. Otherwise, I will give you a call.  If you had a referral placed, they will call you to set up an appointment. Please give us a call if you don't hear back in the next 2 weeks.  If you need additional refills before your next appointment, please call your pharmacy first.  

## 2022-03-02 NOTE — Progress Notes (Deleted)
    SUBJECTIVE:   CHIEF COMPLAINT / HPI:  No chief complaint on file.   Patient was seen in pharmacy clinic about 1 month ago.  At that time he was continued on Basaglar 25 units daily, dulaglutide 0.75 mg weekly, dapagliflozin 5 mg daily.  PERTINENT  PMH / PSH: CAD s/p PCI, T2DM, HTN  Patient Care Team: Zola Button, MD as PCP - General (Family Medicine) Troy Sine, MD as PCP - Cardiology (Cardiology)   OBJECTIVE:   There were no vitals taken for this visit.  Physical Exam      11/13/2021   10:19 AM  Depression screen PHQ 2/9  Decreased Interest 3  Down, Depressed, Hopeless 3  PHQ - 2 Score 6  Altered sleeping 0  Tired, decreased energy 3  Change in appetite 0  Feeling bad or failure about yourself  0  Trouble concentrating 0  Moving slowly or fidgety/restless 0  Suicidal thoughts 0  PHQ-9 Score 9     {Show previous vital signs (optional):23777}  {Labs  Heme  Chem  Endocrine  Serology  Results Review (optional):23779}  ASSESSMENT/PLAN:   No problem-specific Assessment & Plan notes found for this encounter.    No follow-ups on file.   Zola Button, MD [place]

## 2022-03-04 ENCOUNTER — Ambulatory Visit: Payer: Commercial Managed Care - HMO | Admitting: Family Medicine

## 2022-03-23 ENCOUNTER — Other Ambulatory Visit: Payer: Self-pay | Admitting: Student

## 2022-03-30 ENCOUNTER — Ambulatory Visit (INDEPENDENT_AMBULATORY_CARE_PROVIDER_SITE_OTHER): Payer: Commercial Managed Care - HMO | Admitting: Family Medicine

## 2022-03-30 ENCOUNTER — Encounter: Payer: Self-pay | Admitting: Family Medicine

## 2022-03-30 ENCOUNTER — Ambulatory Visit: Payer: Commercial Managed Care - HMO | Admitting: Family Medicine

## 2022-03-30 VITALS — BP 128/82 | HR 73 | Ht 68.0 in | Wt 240.0 lb

## 2022-03-30 DIAGNOSIS — E118 Type 2 diabetes mellitus with unspecified complications: Secondary | ICD-10-CM

## 2022-03-30 DIAGNOSIS — G5601 Carpal tunnel syndrome, right upper limb: Secondary | ICD-10-CM | POA: Diagnosis not present

## 2022-03-30 LAB — POCT GLYCOSYLATED HEMOGLOBIN (HGB A1C): HbA1c, POC (controlled diabetic range): 10.4 % — AB (ref 0.0–7.0)

## 2022-03-30 NOTE — Progress Notes (Signed)
    SUBJECTIVE:   CHIEF COMPLAINT / HPI:  Chief Complaint  Patient presents with   Diabetes f/u    Last seen in pharmacy clinic 12/23 2 months ago.  He was continued on Basaglar 25 units daily, Trulicity 0.93 mg weekly, dapagliflozin 5 mg daily.  Spoke with daughter Dorena Cookey on the phone during today's visit.  Patient reports that he has not been taking the Basaglar or Trulicity for the past month.  He has not been checking his fasting sugars.  He stopped taking the dapagliflozin due to cost. He has been drinking more Kool-Aid lately.  Brillinta and dapagliflozin are cost-prohibitive. Ticagrelor is $400 for 90 days and dapagliflozin is $95 for 30 day supply.   He is still having some right wrist and thumb pain which is improved with acetaminophen and diclofenac gel. He previously had surgery for carpal tunnel syndrome. He has been doing some massage which has helped.  PERTINENT  PMH / PSH: CAD s/p PCI, T2DM, HTN  Patient Care Team: Zola Button, MD as PCP - General (Family Medicine) Troy Sine, MD as PCP - Cardiology (Cardiology)   OBJECTIVE:   BP 128/82   Pulse 73   Ht 5\' 8"  (1.727 m)   Wt 240 lb (108.9 kg)   SpO2 99%   BMI 36.49 kg/m   Physical Exam Constitutional:      General: He is not in acute distress. Cardiovascular:     Rate and Rhythm: Normal rate and regular rhythm.  Pulmonary:     Effort: Pulmonary effort is normal. No respiratory distress.     Breath sounds: Normal breath sounds.  Neurological:     Mental Status: He is alert.         03/30/2022   11:11 AM  Depression screen PHQ 2/9  Decreased Interest 0  Down, Depressed, Hopeless 0  PHQ - 2 Score 0  Altered sleeping 0  Tired, decreased energy 0  Change in appetite 0  Feeling bad or failure about yourself  0  Trouble concentrating 0  Moving slowly or fidgety/restless 0  Suicidal thoughts 0  PHQ-9 Score 0  Difficult doing work/chores Not difficult at all     {Show previous vital signs  (optional):23777}  Last hemoglobin A1c Lab Results  Component Value Date   HGBA1C 10.4 (A) 03/30/2022      ASSESSMENT/PLAN:   Carpal tunnel syndrome of right wrist Could be some arthritis component as well.  Improved with diclofenac gel and acetaminophen.  Recommended using wrist splint at night.  Offered x-ray imaging which patient declined.  Type 2 diabetes mellitus with complication (HCC) G1W minimally improved since prior, overall still poorly controlled secondary to medication nonadherence.  No medication changes today, counseled patient to take his medications and monitor CBGs. - continue Basaglar 25 units daily - continue dulaglutide 0.75 mg weekly - continue dapagliflozin 5 mg daily - advised to check fasting CBG daily and record values - f/u pharmacy clinic in 1 month   Will reach out to pharmacy team to assist with medication cost  Return in about 3 months (around 06/28/2022) for f/u diabetes.   Zola Button, MD [place]

## 2022-03-30 NOTE — Assessment & Plan Note (Signed)
Could be some arthritis component as well.  Improved with diclofenac gel and acetaminophen.  Recommended using wrist splint at night.  Offered x-ray imaging which patient declined.

## 2022-03-30 NOTE — Assessment & Plan Note (Signed)
A1c minimally improved since prior, overall still poorly controlled secondary to medication nonadherence.  No medication changes today, counseled patient to take his medications and monitor CBGs. - continue Basaglar 25 units daily - continue dulaglutide 0.75 mg weekly - continue dapagliflozin 5 mg daily - advised to check fasting CBG daily and record values - f/u pharmacy clinic in 1 month

## 2022-03-30 NOTE — Patient Instructions (Addendum)
It was nice seeing you today!  Remember to take your medications every day and check your fasting blood sugar every morning. Make sure to write these numbers down.  Try to cut back on Kool Aid and ice cream.  Think about trying a cock-up wrist splint or splint specific for carpal tunnel.  I will connect with our pharmacy team to see if you can get your medications for cheaper.  Stay well, Zola Button, MD [place] [phone]  --  Make sure to check out at the front desk before you leave today.  Please arrive at least 15 minutes prior to your scheduled appointments.  If you had blood work today, I will send you a MyChart message or a letter if results are normal. Otherwise, I will give you a call.  If you had a referral placed, they will call you to set up an appointment. Please give [place] a call if you don't hear back in the next 2 weeks.  If you need additional refills before your next appointment, please call your pharmacy first.

## 2022-04-08 ENCOUNTER — Telehealth: Payer: Self-pay

## 2022-04-08 NOTE — Telephone Encounter (Signed)
Will route to MD/PA (who recently saw patient last) to review and see of any recommendations.   Thank you!

## 2022-04-08 NOTE — Telephone Encounter (Signed)
[name] calls nurse line regarding medication cost. Patient was seen on 2/12 and reported issues with cost of [place] and Brilinta.   Advised daughter that I would reach out to pharmacy team regarding cost of Farxiga. Recommended that she reach out to Cardiology office regarding cost issues with Brilinta.   Talbot Grumbling, RN

## 2022-04-08 NOTE — Telephone Encounter (Signed)
[name] Lisette Grinder called and wanted to know if there is a medication that the Brilinta can be replaced with because the medication is $400 a month.  [name] said that she can be called back at the contact number in the patient's chart.

## 2022-04-09 ENCOUNTER — Other Ambulatory Visit (HOSPITAL_COMMUNITY): Payer: Self-pay

## 2022-04-09 NOTE — Telephone Encounter (Signed)
Reached out to patients daughter regarding pap for both medications.   Will mail application for [place] to patients home. Both medications are under same program. I will keep in contact with cardiologist as well regarding enrollment

## 2022-04-10 NOTE — Telephone Encounter (Signed)
His last PCI was in 2020, since he is more than 1 year out from last PCI, he may discontinue Brilinta as long as he is not having any new chest pain.

## 2022-04-13 NOTE — Telephone Encounter (Signed)
[name] returning call. 

## 2022-04-13 NOTE — Telephone Encounter (Signed)
Called patient, LVM to call back to discuss.   Left call back number.   

## 2022-04-13 NOTE — Telephone Encounter (Signed)
Spoke with pt daughter, she is going to confirm he is not having any chest pain prior to stopping brilinta.

## 2022-04-14 ENCOUNTER — Other Ambulatory Visit (HOSPITAL_COMMUNITY): Payer: Self-pay

## 2022-04-28 ENCOUNTER — Ambulatory Visit: Payer: Commercial Managed Care - HMO | Admitting: Pharmacist

## 2022-05-05 NOTE — Telephone Encounter (Signed)
Rec'd completed patients portion back of patient assistance (AZ&[place]) application for Brilinta and [place].   Will patient be continuing Brilinta? Wanted to be sure before faxing over the provider page to [employer].

## 2022-05-13 NOTE — Telephone Encounter (Signed)
Submitted application for FARXIGA to [employer] for patient assistance.   Phone: [phone]  

## 2022-05-13 NOTE — Telephone Encounter (Signed)
Good afternoon,  Sorry for the delay- I did review a previous message from recently:   Keith Newton, [place]    04/10/22  6:12 PM Note His last PCI was in 2020, since he is more than 1 year out from last PCI, he may discontinue Brilinta as long as he is not having any new chest pain.

## 2022-05-21 NOTE — Telephone Encounter (Signed)
Received notification from [employer]&ME regarding approval for [facility]. Patient assistance approved from 05/14/22 to 02/16/23.  MEDICATION WILL SHIP TO PATIENTS HOME IN 7-10 BUSINESS DAYS  Phone: [phone]

## 2022-05-26 ENCOUNTER — Telehealth: Payer: Self-pay

## 2022-05-26 NOTE — Telephone Encounter (Signed)
Patients daughter calls nurse line in regards to apt tomorrow with Koval.   She reports she will be unable to attend this apt. She would like for Koval to know he has not been compliant with his diabetic medications.    She is requesting to be contacted for any medication changes.   Will forward to [place].

## 2022-05-27 ENCOUNTER — Ambulatory Visit (INDEPENDENT_AMBULATORY_CARE_PROVIDER_SITE_OTHER): Payer: Medicare HMO | Admitting: Pharmacist

## 2022-05-27 VITALS — BP 126/92 | HR 102 | Wt 238.2 lb

## 2022-05-27 DIAGNOSIS — E118 Type 2 diabetes mellitus with unspecified complications: Secondary | ICD-10-CM | POA: Diagnosis not present

## 2022-05-27 NOTE — Assessment & Plan Note (Signed)
Diabetes diagnosed in 2024. Patient is able to verbalize appropriate hypoglycemia management plan. Medication adherence appears good. Control is suboptimal due to elevated blood sugars. -Increased dose of basal insulin Basaglar Kwikpen (insulin glargine) from 25 units to 35 units daily -Continued GLP-1 Trulicity (dulaglutide) 0.75 mg weekly -Continued SGLT2-I Farxiga (dapagliflozin) 5 mg daily. Counseled on sick day rules. -Extensively discussed pathophysiology of diabetes, recommended lifestyle interventions, dietary effects on blood sugar control.  -Asked to check home blood sugars 3-5 times per week and record on sheet provided until next visit.  -Counseled on s/sx of and management of hypoglycemia.  -Next A1c anticipated 06/2022.

## 2022-05-27 NOTE — Progress Notes (Signed)
Reviewed and agree with Dr Koval's plan.   

## 2022-05-27 NOTE — Patient Instructions (Addendum)
It was great seeing you today! Please check your blood sugar 3-5 times a week, record those measurements in the provided log, and bring them to your next visit.  Here are the changes we made to your medications:  Continue Trulicity (dulaglutide) 0.75 mg weekly  Increase dose of Basaglar Kwikpen (insulin glargine) to 35 units daily

## 2022-05-27 NOTE — Progress Notes (Signed)
S:     Chief Complaint  Patient presents with  . Medication Management    T2DM    67 y.o. male who presents for diabetes evaluation, education, and management.  PMH is significant for T2DM.  Patient was referred and last seen by Primary Care Provider, Dr. Wynelle Link, on 01/27/22.  At last visit, patient was advised to be adherent to his medications to improve his glycemic control. Patient continued on Basaglar Kwikpen (insulin glargine) 25 units daily, Trulicity (dulaglutide) 0.75 mg weekly, Farxiga (dapagliflozin) 5 mg daily  Today, patient arrives in good spirits and presents without any assistance. Spoke with patient's daughter through phone.  Patient reports Diabetes was diagnosed in 12/2018.   Current diabetes medications include: Farxiga (dapagliflozin) 5 mg daily, Trulicity (dulaglutide) 0.75 mg weekly, Basaglar Kwikpen (insulin glargine) 25 units daily Current hypertension medications include: Metoprolol-XL 100 mg daily  Patient reports adherence to taking all medications as prescribed. Sometimes forgets to take Trulicity dose. Did take it this previous Saturday.   Do you feel that your medications are working for you? yes Have you been experiencing any side effects to the medications prescribed? Yes. When he missed an injection, he had side effect of headaches. Do you have any problems obtaining medications due to transportation or finances? no Insurance coverage: [employer]  Patient reports a hypoglycemic event. Had headache and dizziness  Patient does not check blood sugars.  Patient reports nocturia (nighttime urination) 2-3 times per night, down from 3-4 times per night. Patient denies neuropathy (nerve pain). Patient denies visual changes. Patient denies self foot exams.  Patient denies any chest pain recently  Within the past 12 months, did you worry whether your food would run out before you got money to buy more? no Within the past 12 months, did the food you  bought run out, and you didn't have money to get more? no  O:   Review of Systems  All other systems reviewed and are negative.   Physical Exam Constitutional:      Appearance: Normal appearance.  Pulmonary:     Effort: Pulmonary effort is normal.     Breath sounds: Normal breath sounds.  Neurological:     Mental Status: He is alert.  Psychiatric:        Mood and Affect: Mood normal.        Behavior: Behavior normal.        Thought Content: Thought content normal.        Judgment: Judgment normal.    Lab Results  Component Value Date   HGBA1C 10.4 (A) 03/30/2022   Vitals:   05/27/22 1052 05/27/22 1053  BP: (!) 132/90 (!) 126/92  Pulse: (!) 102   SpO2: 97%     Lipid Panel     Component Value Date/Time   CHOL 194 11/05/2020 1545   TRIG 289 (H) 11/05/2020 1545   HDL 41 11/05/2020 1545   CHOLHDL 4.7 11/05/2020 1545   CHOLHDL 6.6 12/24/2018 0214   VLDL 25 12/24/2018 0214   LDLCALC 104 (H) 11/05/2020 1545   LDLDIRECT 58 02/21/2021 1007    Clinical Atherosclerotic Cardiovascular Disease (ASCVD): Yes  The ASCVD Risk score (Arnett DK, et al., 2019) failed to calculate for the following reasons:   The patient has a prior MI or stroke diagnosis    A/P: Diabetes diagnosed in 2024. Patient is able to verbalize appropriate hypoglycemia management plan. Medication adherence appears good. Control is suboptimal due to elevated blood sugars. -Increased dose of  basal insulin Basaglar Kwikpen (insulin glargine) from 25 units to 35 units daily -Continued GLP-1 Trulicity (dulaglutide) 0.75 mg weekly -Continued SGLT2-I Farxiga (dapagliflozin) 5 mg daily. Counseled on sick day rules. -Extensively discussed pathophysiology of diabetes, recommended lifestyle interventions, dietary effects on blood sugar control.  -Asked to check home blood sugars 3-5 times per week and record on sheet provided until next visit.  -Counseled on s/sx of and management of hypoglycemia.  -Next A1c  anticipated 06/2022.   Written patient instructions provided. Patient verbalized understanding of treatment plan.  Total time in face to face counseling 25 minutes.    Follow-up:  Pharmacist 06/22/22. Patient seen with Revonda Standard, PharmD Candidate.

## 2022-06-10 ENCOUNTER — Ambulatory Visit: Payer: Medicare HMO | Attending: Physician Assistant | Admitting: Physician Assistant

## 2022-06-11 NOTE — Progress Notes (Signed)
This encounter was created in error - please disregard.

## 2022-06-22 ENCOUNTER — Ambulatory Visit (INDEPENDENT_AMBULATORY_CARE_PROVIDER_SITE_OTHER): Payer: Medicare HMO | Admitting: Pharmacist

## 2022-06-22 ENCOUNTER — Encounter: Payer: Self-pay | Admitting: Pharmacist

## 2022-06-22 VITALS — BP 121/72 | HR 90 | Ht 68.0 in | Wt 239.4 lb

## 2022-06-22 DIAGNOSIS — E1142 Type 2 diabetes mellitus with diabetic polyneuropathy: Secondary | ICD-10-CM | POA: Diagnosis not present

## 2022-06-22 DIAGNOSIS — I25110 Atherosclerotic heart disease of native coronary artery with unstable angina pectoris: Secondary | ICD-10-CM | POA: Diagnosis not present

## 2022-06-22 DIAGNOSIS — E785 Hyperlipidemia, unspecified: Secondary | ICD-10-CM

## 2022-06-22 DIAGNOSIS — I152 Hypertension secondary to endocrine disorders: Secondary | ICD-10-CM

## 2022-06-22 DIAGNOSIS — E1169 Type 2 diabetes mellitus with other specified complication: Secondary | ICD-10-CM | POA: Diagnosis not present

## 2022-06-22 DIAGNOSIS — E118 Type 2 diabetes mellitus with unspecified complications: Secondary | ICD-10-CM | POA: Diagnosis not present

## 2022-06-22 DIAGNOSIS — E1159 Type 2 diabetes mellitus with other circulatory complications: Secondary | ICD-10-CM | POA: Diagnosis not present

## 2022-06-22 MED ORDER — EZETIMIBE 10 MG PO TABS: 10.0000 mg | ORAL_TABLET | Freq: Every day | ORAL | 3 refills | Status: DC

## 2022-06-22 MED ORDER — ONETOUCH DELICA LANCETS 33G MISC: 11 refills | Status: DC

## 2022-06-22 MED ORDER — METOPROLOL SUCCINATE ER 100 MG PO TB24
100.0000 mg | ORAL_TABLET | Freq: Every day | ORAL | 3 refills | Status: DC
Start: 2022-06-22 — End: 2023-04-02

## 2022-06-22 MED ORDER — TRULICITY 1.5 MG/0.5ML ~~LOC~~ SOAJ: 0.7500 mg | SUBCUTANEOUS | 3 refills | Status: DC

## 2022-06-22 MED ORDER — PREGABALIN 50 MG PO CAPS: ORAL_CAPSULE | ORAL | 3 refills | Status: DC

## 2022-06-22 MED ORDER — ONETOUCH VERIO W/DEVICE KIT: PACK | 0 refills | Status: DC

## 2022-06-22 MED ORDER — PREGABALIN 50 MG PO CAPS: ORAL_CAPSULE | ORAL | 0 refills | Status: DC

## 2022-06-22 MED ORDER — DAPAGLIFLOZIN PROPANEDIOL 5 MG PO TABS: 5.0000 mg | ORAL_TABLET | Freq: Every day | ORAL | 0 refills | Status: DC

## 2022-06-22 MED ORDER — ONETOUCH VERIO VI STRP: ORAL_STRIP | 12 refills | Status: DC

## 2022-06-22 MED ORDER — ATORVASTATIN CALCIUM 80 MG PO TABS: 80.0000 mg | ORAL_TABLET | Freq: Every day | ORAL | 3 refills | Status: DC

## 2022-06-22 MED ORDER — ISOSORBIDE MONONITRATE ER 30 MG PO TB24
30.0000 mg | ORAL_TABLET | Freq: Every day | ORAL | 3 refills | Status: AC
Start: 2022-06-22 — End: ?

## 2022-06-22 NOTE — Progress Notes (Signed)
The patient was seen by Dr. Raymondo Band.  Complained of neuropathic pain, which responded to Lyrica in the past. Last refilled Dec 2023 with three refills. Would have been out since March of 2024.  Refill given at Dr. Macky Lower request. Follow up with PCP for medication management and adjustment.

## 2022-06-22 NOTE — Patient Instructions (Addendum)
Try either Loratadine (Claritin) or Fexofenadine (Allegra) for allergies.  It was nice to see you today!  Your goal blood sugar is 80-130 before eating and less than 180 after eating.  Medication Changes: Continue all other medications.  Begin Libre 3 Sensors  Increase Trulicity 1.5 weekly on Sundays  Monitor blood sugars at home and keep a log (glucometer or piece of paper) to bring with you to your next visit.  Keep up the good work with diet and exercise. Aim for a diet full of vegetables, fruit and lean meats (chicken, [place], fish). Try to limit salt intake by eating fresh or frozen vegetables (instead of canned), rinse canned vegetables prior to cooking and do not add any additional salt to meals.

## 2022-06-22 NOTE — Assessment & Plan Note (Signed)
Hypertension longstanding currently at goal of <130 mmHg. Medication adherence appears to be good with use of pill boxes that are stocked by daughter.  -Continued metoprolol 100mg  daily - Refilled metoprolol and isosorbide mononitrate.  Reviewed appropriate use of SL NTG.  Asked patient to check if current supply is in date and contact us if he needs refill.

## 2022-06-22 NOTE — Progress Notes (Signed)
S:     Chief Complaint  Patient presents with   Medication Management    diabetes   67 y.o. male who presents for diabetes evaluation, education, and management.  PMH is significant for T2DM.  Patient was referred and last seen by Primary Care Provider, Dr. Wynelle Link, on 01/27/22. At last visit, adjusted insulin and encouraged adherence with medications.   Today, patient arrives in good spirits and presents without any assistance.   Patient reports Diabetes was diagnosed in 12/2018  Family/Social History: Daughter assists with medication boxes - fills two weeks at a time.   Farxiga (dapagliflozin) 5 mg daily, Trulicity (dulaglutide) 0.75 mg weekly, Basaglar Kwikpen (insulin glargine) 25 units daily Current hypertension medications include: Metoprolol-XL 100 mg daily Current hyperlipidemia medications include: atorvastatin 80mg  and ezetimibe 10mg   Patient reports adherence to taking all medications as prescribed.  However missed dose of Trulicity yesterday, plans to take it today.  Patient has not taken pregabalin and requests refill due to his "neuropathic leg and hand pain" He reports less fatigue.    Do you feel that your medications are working for you? yes Have you been experiencing any side effects to the medications prescribed? no  Insurance coverage: Now - BC[employer]  Patient denies hypoglycemic events.  Patient reports neuropathy (nerve pain).  Patient-reported exercise habits: Increased slightly since previous. Currently helping his son work on patient's truck.    O:   Review of Systems  Neurological:  Positive for sensory change (feet and hands).  All other systems reviewed and are negative.   Physical Exam Constitutional:      Appearance: Normal appearance.  Pulmonary:     Effort: Pulmonary effort is normal.  Neurological:     Mental Status: He is alert.  Psychiatric:        Mood and Affect: Mood normal.        Behavior: Behavior normal.    Average blood  glucose: unknown as patient is not checking.  Did report need for new meter due to old meter not working.     Lab Results  Component Value Date   HGBA1C 10.4 (A) 03/30/2022   Vitals:   06/22/22 1018  BP: 121/72  Pulse: 90  SpO2: 99%    Lipid Panel     Component Value Date/Time   CHOL 194 11/05/2020 1545   TRIG 289 (H) 11/05/2020 1545   HDL 41 11/05/2020 1545   CHOLHDL 4.7 11/05/2020 1545   CHOLHDL 6.6 12/24/2018 0214   VLDL 25 12/24/2018 0214   LDLCALC 104 (H) 11/05/2020 1545   LDLDIRECT 58 02/21/2021 1007    Clinical Atherosclerotic Cardiovascular Disease (ASCVD): Yes  The ASCVD Risk score (Arnett DK, et al., 2019) failed to calculate for the following reasons:   The patient has a prior MI or stroke diagnosis   A/P: Diabetes longstanding currently adherent with medications but control is unclear. Patient is able to verbalize appropriate hypoglycemia management plan. Medication adherence appears improved.  -Continued basal insulin Basaglar (insulin glargine) at 38 units daily -Increased dose of GLP-1 Trulicity (dulaglutide) from 7.5mg  (last dose today) to 1.5mg  weekly next week .  -Continued SGLT2-I  Farxiga (dapagliflozin) 5mg  daily. Plan to increase dose in near. Counseled on sick day rules. Advised to pick-up the previously prescribed Libre 3 sensors.  Daughter plan to initiate use.  - New glucometer, strips and lancets sent to pharmacy.  -Patient educated on purpose, proper use, and potential adverse effects. -Extensively discussed pathophysiology of diabetes, recommended lifestyle interventions,  dietary effects on blood sugar control.  -Counseled on s/sx of and management of hypoglycemia.   ASCVD risk - secondary prevention in patient with diabetes. Last LDL is at goal of <70 mg/dL with direct LDL in 02/6107 - 58mg /dl. Given history of CAD with PCI = high intensity statin indicated.  -Continued atorvastatin 80 mg. - Continued ezetimibe 10mg  daily  -Refilled both  agents.  Hypertension longstanding currently at goal of <130 mmHg. Medication adherence appears to be good with use of pill boxes that are stocked by daughter.  -Continued metoprolol 100mg  daily - Refilled metoprolol and isosorbide mononitrate.  Reviewed appropriate use of SL NTG.  Asked patient to check if current supply is in date and contact us if he needs refill.   History of allergic rhinitis - managed and minimal symptoms with use of cetirizine 30mg  (3X10mg ) daily  - Advised to trial alternative antihistamine to minimize risk of sedation.  - Suggested trial of fexofenadine 180mg  daily or loratadine 10mg  daily.   Diabetes neuropathic pain - bilateral feet and hands.  Previously treated with pregabalin.  -patient requested refill pregabalin - Refill provided by attending Dr. Lum Babe.   Written patient instructions provided. Patient verbalized understanding of treatment plan.  Total time in face to face counseling 27 minutes.    Follow-up:  Pharmacist 3weeks. PCP clinic visit in next 1-2 months.  Patient seen with Haze Boyden PharmD Candidate.

## 2022-06-22 NOTE — Assessment & Plan Note (Addendum)
Diabetes longstanding currently adherent with medications but control is unclear. Patient is able to verbalize appropriate hypoglycemia management plan. Medication adherence appears improved.  -Continued basal insulin Basaglar (insulin glargine) at 38 units daily -Increased dose of GLP-1 Trulicity (dulaglutide) from 7.5mg  (last dose today) to 1.5mg  weekly next week .  -Continued SGLT2-I  Farxiga (dapagliflozin) 5mg  daily. Plan to increase dose in near. Counseled on sick day rules. Advised to pick-up the previously prescribed Libre 3 sensors.  Daughter plan to initiate use.  - New glucometer, strips and lancets sent to pharmacy.  -Patient educated on purpose, proper use, and potential adverse effects. -Extensively discussed pathophysiology of diabetes, recommended lifestyle interventions, dietary effects on blood sugar control.  -Counseled on s/sx of and management of hypoglycemia.

## 2022-06-22 NOTE — Assessment & Plan Note (Signed)
ASCVD risk - secondary prevention in patient with diabetes. Last LDL is at goal of <70 mg/dL with direct LDL in 02/6107 - 58mg /dl. Given history of CAD with PCI = high intensity statin indicated.  -Continued atorvastatin 80 mg. - Continued ezetimibe 10mg  daily  -Refilled both agents.

## 2022-06-26 NOTE — Progress Notes (Deleted)
Cardiology Clinic Note   Patient Name: Keith Newton Date of Encounter: 06/26/2022  Primary Care Provider:  Littie Deeds, MD Primary Cardiologist:  Nicki Guadalajara, MD  Patient Profile    Keith Newton 67 year old male presents to the clinic today for follow-up evaluation of his coronary artery disease and hyperlipidemia.  Past Medical History    Past Medical History:  Diagnosis Date   Arthritis    Back pain    CAD (coronary artery disease)    a. s/p NSTEMI in 12/2018 with 80% Prox-RCA stenosis followed by 100% prox to mid-RCA stenosis --> DESx2 to the RCA. Med management of residual disease.    Carpal tunnel syndrome    DDD (degenerative disc disease)    Diabetes mellitus    type 2   Hyperlipidemia    Hypertension    states very mild   Low testosterone    Myocardial infarction (HCC)    Overweight    Seasonal allergies    Tachycardia    Past Surgical History:  Procedure Laterality Date   CARDIAC CATHETERIZATION     CARPAL TUNNEL RELEASE  07/02/2011   Procedure: CARPAL TUNNEL RELEASE;  Surgeon: Wyn Forster., MD;  Location: [facility];  Service: Orthopedics;  Laterality: Right;   CARPAL TUNNEL RELEASE  07/30/2011   Procedure: CARPAL TUNNEL RELEASE;  Surgeon: Wyn Forster., MD;  Location: [facility];  Service: Orthopedics;  Laterality: Left;   CORONARY ANGIOPLASTY     CORONARY STENT INTERVENTION N/A 12/23/2018   Procedure: CORONARY STENT INTERVENTION;  Surgeon: Corky Crafts, MD;  Location: [facility] INVASIVE CV LAB;  Service: Cardiovascular;  Laterality: N/A;   LEFT HEART CATH AND CORONARY ANGIOGRAPHY N/A 12/23/2018   Procedure: LEFT HEART CATH AND CORONARY ANGIOGRAPHY;  Surgeon: Corky Crafts, MD;  Location: [facility] INVASIVE CV LAB;  Service: Cardiovascular;  Laterality: N/A;   LUMBAR EPIDURAL INJECTION     x2   SHOULDER ARTHROSCOPY WITH OPEN ROTATOR CUFF REPAIR AND DISTAL CLAVICLE ACROMINECTOMY Right 06/03/2020   Procedure: right  shoulder arthroscopy, biceps tenodesis, mini open rotator cuff tear repair subscap/supraspinatus, lateral debridement;  Surgeon: Cammy Copa, MD;  Location: [facility] OR;  Service: Orthopedics;  Laterality: Right;    Allergies  Allergies  Allergen Reactions   Codeine Swelling    Lips and facial swelling    History of Present Illness    Keith Newton has a PMH of HTN, HLD, coronary artery disease, degenerative disc disease, type 2 diabetes, seasonal allergies, and tachycardia.  Underwent urgent catheterization 11/20 which showed mid RCA 2 lesions 100% stenosed.  He received PCI with DES.  He was also noted to have proximal RCA lesion 80% stenosed which also received DES.  His RPDA lesion 75% stenosed, mid circumflex 50% stenosed, mid LAD 40% stenosed, mid LAD-distal LAD 25% stenosed LVEF was normal.  Dual antiplatelet therapy was recommended for 1 year.  He was seen in follow-up by Dr. Tresa Endo on 11/17/2021.  His EKG showed normal sinus rhythm 84 bpm.  His daughter was on the phone throughout evaluation.  His cardiac PET/CT was reviewed which showed low risk and normal EF.  Follow-up was planned for 6 months.  He presents to the clinic today for follow-up evaluation and states***.  *** denies chest pain, shortness of breath, lower extremity edema, fatigue, palpitations, melena, hematuria, hemoptysis, diaphoresis, weakness, presyncope, syncope, orthopnea, and PND.  Coronary artery disease-no chest pain today.  Underwent cardiac catheterization with DES x 2 to  his RCA on 12/23/2018.  Cardiac PET/CT 9/23 was low risk and showed normal EF. Continue current medical therapy Maintain physical activity Heart healthy low-sodium diet  Hyperlipidemia-LDL***. High-fiber diet Continue aspirin, ezetimibe, atorvastatin  Essential hypertension-BP today***. Maintain blood pressure log Continue metoprolol, Imdur  Type 2 diabetes-*** Continue insulin, Marcelline Deist Follows with PCP  Obesity-weight  today*** Heart healthy low-sodium high-fiber diet Increase physical activity as tolerated  Disposition: Follow-up with Dr. Tresa Endo or me in 9-12 months.   Home Medications    Prior to Admission medications   Medication Sig Start Date End Date Taking? Authorizing Provider  acetaminophen (TYLENOL) 325 MG tablet Take 2 tablets (650 mg total) by mouth every 6 (six) hours as needed. Patient taking differently: Take 1,950 mg by mouth every 12 (twelve) hours as needed. 11/05/20   Littie Deeds, MD  aspirin EC 81 MG EC tablet Take 1 tablet (81 mg total) by mouth daily. 12/25/18   Strader, Lennart Pall, PA-C  atorvastatin (LIPITOR) 80 MG tablet Take 1 tablet (80 mg total) by mouth daily. 06/22/22   McDiarmid, Leighton Roach, MD  Blood Glucose Monitoring Suppl [facility] VERIO) w/Device KIT Use as directed for daily and PRN testing 06/22/22   McDiarmid, Leighton Roach, MD  cetirizine (ZYRTEC) 10 MG tablet Take 1 tablet (10 mg total) by mouth daily. 11/27/21   Alfredo Martinez, MD  Continuous Blood Gluc Sensor (FREESTYLE LIBRE 3 SENSOR) MISC 1 each by Does not apply route every 14 (fourteen) days. Place 1 sensor on the skin every 14 days. Use to check glucose continuously Patient not taking: Reported on 01/01/2022 11/05/21   Moses Manners, MD  dapagliflozin propanediol (FARXIGA) 5 MG TABS tablet Take 1 tablet (5 mg total) by mouth daily. 06/22/22   McDiarmid, Leighton Roach, MD  diclofenac Sodium (VOLTAREN) 1 % GEL Apply 2 g topically 4 (four) times daily. 01/22/22   Moses Manners, MD  Dulaglutide (TRULICITY) 1.5 MG/0.5ML SOPN Inject 0.75 mg into the skin once a week. 06/22/22   McDiarmid, Leighton Roach, MD  Elastic Bandages & Supports (MEDICAL COMPRESSION STOCKINGS) MISC Wear daily 04/01/20   Sandre Kitty, MD  ezetimibe (ZETIA) 10 MG tablet Take 1 tablet (10 mg total) by mouth daily. 06/22/22   McDiarmid, Leighton Roach, MD  fluticasone (FLONASE) 50 MCG/ACT nasal spray Place 1 spray into both nostrils daily. 1 spray in each nostril every day Patient  not taking: Reported on 06/22/2022 11/13/21   Littie Deeds, MD  glucose blood [facility] VERIO) test strip Use as instructed 06/22/22   McDiarmid, Leighton Roach, MD  Insulin Glargine (BASAGLAR KWIKPEN) 100 UNIT/ML Inject 40 Units into the skin daily. Patient taking differently: Inject 38 Units into the skin daily. 08/12/21   Littie Deeds, MD  Insulin Pen Needle (NOVOFINE PLUS PEN NEEDLE) 32G X 4 MM MISC USE WITH TRESIBA DAILY Patient not taking: Reported on 06/22/2022 07/15/21   Littie Deeds, MD  isosorbide mononitrate (IMDUR) 30 MG 24 hr tablet Take 1 tablet (30 mg total) by mouth daily. 06/22/22   McDiarmid, Leighton Roach, MD  Lancet Devices (ONE TOUCH DELICA LANCING DEV) MISC Check fasting blood glucose in AM and before giving insulin. 08/17/19   Sandre Kitty, MD  metoprolol succinate (TOPROL-XL) 100 MG 24 hr tablet Take 1 tablet (100 mg total) by mouth at bedtime. Take with or immediately following a meal. 06/22/22   McDiarmid, Leighton Roach, MD  nitroGLYCERIN (NITROSTAT) 0.4 MG SL tablet Place 1 tablet (0.4 mg total) under the tongue every 5 (  five) minutes x 3 doses as needed for chest pain. Patient not taking: Reported on 06/22/2022 06/26/21   Marcelino Duster, PA  OneTouch Delica Lancets 33G MISC Check fasting blood glucose in AM and before giving insulin. 06/22/22   McDiarmid, Leighton Roach, MD  pregabalin (LYRICA) 50 MG capsule TAKE 1 CAPSULE BY MOUTH 3 TIMES DAILY AS NEEDED. 06/22/22   Doreene Eland, MD    Family History    Family History  Problem Relation Age of Onset   Stroke Mother    CAD Father    CAD Maternal Grandfather    CAD Paternal Grandfather    He indicated that the status of his mother is unknown. He indicated that the status of his father is unknown. He indicated that the status of his maternal grandfather is unknown. He indicated that the status of his paternal grandfather is unknown.  Social History    Social History   Socioeconomic History   Marital status: Married    Spouse name: Hampton Rhew    Number of children: 2   Years of education: 16   Highest education level: Bachelor's degree (e.g., BA, AB, BS)  Occupational History   Occupation: retired  Tobacco Use   Smoking status: Never   Smokeless tobacco: Never  Vaping Use   Vaping Use: Never used  Substance and Sexual Activity   Alcohol use: No   Drug use: No   Sexual activity: Not on file  Other Topics Concern   Not on file  Social History Narrative   Not on file   Social Determinants of Health   Financial Resource Strain: High Risk (06/27/2021)   Overall Financial Resource Strain (CARDIA)    Difficulty of Paying Living Expenses: Hard  Food Insecurity: Food Insecurity Present (07/21/2021)   Hunger Vital Sign    Worried About Running Out of Food in the Last Year: Often true    Ran Out of Food in the Last Year: Often true  Transportation Needs: No Transportation Needs (06/27/2021)   PRAPARE - Administrator, Civil Service (Medical): No    Lack of Transportation (Non-Medical): No  Physical Activity: Inactive (02/14/2019)   Exercise Vital Sign    Days of Exercise per Week: 0 days    Minutes of Exercise per Session: 0 min  Stress: No Stress Concern Present (02/14/2019)   [employer] of Occupational Health - Occupational Stress Questionnaire    Feeling of Stress : Only a little  Social Connections: Not on file  Intimate Partner Violence: Not on file     Review of Systems    General:  No chills, fever, night sweats or weight changes.  Cardiovascular:  No chest pain, dyspnea on exertion, edema, orthopnea, palpitations, paroxysmal nocturnal dyspnea. Dermatological: No rash, lesions/masses Respiratory: No cough, dyspnea Urologic: No hematuria, dysuria Abdominal:   No nausea, vomiting, diarrhea, bright red blood per rectum, melena, or hematemesis Neurologic:  No visual changes, wkns, changes in mental status. All other systems reviewed and are otherwise negative except as noted above.  Physical Exam     VS:  There were no vitals taken for this visit. , BMI There is no height or weight on file to calculate BMI. GEN: Well nourished, well developed, in no acute distress. HEENT: normal. Neck: Supple, no JVD, carotid bruits, or masses. Cardiac: RRR, no murmurs, rubs, or gallops. No clubbing, cyanosis, edema.  Radials/DP/PT 2+ and equal bilaterally.  Respiratory:  Respirations regular and unlabored, clear to auscultation bilaterally. GI: Soft,  nontender, nondistended, BS + x 4. MS: no deformity or atrophy. Skin: warm and dry, no rash. Neuro:  Strength and sensation are intact. Psych: Normal affect.  Accessory Clinical Findings    Recent Labs: 07/11/2021: BUN 9; Creatinine, Ser 1.07; Hemoglobin 15.2; Platelets 281; Potassium 4.0; Sodium 137   Recent Lipid Panel    Component Value Date/Time   CHOL 194 11/05/2020 1545   TRIG 289 (H) 11/05/2020 1545   HDL 41 11/05/2020 1545   CHOLHDL 4.7 11/05/2020 1545   CHOLHDL 6.6 12/24/2018 0214   VLDL 25 12/24/2018 0214   LDLCALC 104 (H) 11/05/2020 1545   LDLDIRECT 58 02/21/2021 1007    No BP recorded.  {Refresh Note OR Click here to enter BP  :1}***    ECG personally reviewed by me today- *** - No acute changes     DAPT for 1 year given ACS.    Intervention        Echocardiogram 12/23/2018 IMPRESSIONS  1. Left ventricular ejection fraction, by visual estimation, is 55 to 60%. The left ventricle has normal function. There is moderately increased left ventricular hypertrophy. Basal inferior hypokinesis.  2. Global right ventricle has mildly reduced systolic function.The right ventricular size is normal. No increase in right ventricular wall thickness.  3. Left atrial size was normal.  4. Right atrial size was normal.  5. The mitral valve is normal in structure. No evidence of mitral valve regurgitation.  6. The tricuspid valve is normal in structure. Tricuspid valve regurgitation is trivial.  7. The aortic valve is tricuspid. Aortic  valve regurgitation is not visualized.  8. The pulmonic valve was not well visualized. Pulmonic valve regurgitation is trivial.     NM PET CT CARDIAC PERFUSION: 11/04/2021   The study is normal. The study is low risk.   LV perfusion is normal. There is no evidence of ischemia. There is no evidence of infarction.   Rest left ventricular function is normal. Rest EF: 55 %. Stress left ventricular function is normal. Stress EF: 61 %. End diastolic cavity size is normal. End systolic cavity size is normal. No evidence of transient ischemic dilation (TID) noted.   Myocardial blood flow was computed to be 0.28ml/g/min at rest and 1.76ml/g/min at stress. Global myocardial blood flow reserve was 2.49 and was normal.   Coronary calcium assessment not performed due to prior revascularization.   Electronically signed by: Parke Poisson, MD  Assessment & Plan   1.  ***   Thomasene Ripple. Weslyn Holsonback NP-C     06/26/2022, 7:57 AM [facility] [address] Office [phone] Fax [phone]    I spent***minutes examining this patient, reviewing medications, and using patient centered shared decision making involving her cardiac care.  Prior to her visit I spent greater than 20 minutes reviewing her past medical history,  medications, and prior cardiac tests.

## 2022-06-29 ENCOUNTER — Ambulatory Visit: Payer: Medicare HMO | Attending: General Practice | Admitting: General Practice

## 2022-06-29 NOTE — Progress Notes (Signed)
Reviewed and agree with Dr Koval's plan.   

## 2022-06-30 ENCOUNTER — Encounter: Payer: Self-pay | Admitting: General Practice

## 2022-07-01 ENCOUNTER — Telehealth: Payer: Self-pay

## 2022-07-01 DIAGNOSIS — E118 Type 2 diabetes mellitus with unspecified complications: Secondary | ICD-10-CM

## 2022-07-01 NOTE — Telephone Encounter (Signed)
Rec'd PA request for patients ONE TOUCH VERIO FLEX SYSTEM.   Insurance prefers: [employer] (e.g., Accu-Check Aviva Plus, Accu-Check Guide, Accu-Chek Guide Me) or [employer] (e.g. TrueMetrix, TrueTrack).  Would you like to change to a preferred system or attempt the PA?

## 2022-07-07 MED ORDER — ACCU-CHEK GUIDE VI STRP
ORAL_STRIP | 12 refills | Status: DC
Start: 2022-07-07 — End: 2022-12-24

## 2022-07-07 MED ORDER — ACCU-CHEK GUIDE W/DEVICE KIT
PACK | 0 refills | Status: DC
Start: 2022-07-07 — End: 2022-12-24

## 2022-07-07 MED ORDER — ACCU-CHEK SOFTCLIX LANCETS MISC
12 refills | Status: DC
Start: 2022-07-07 — End: 2023-07-26

## 2022-07-07 NOTE — Telephone Encounter (Signed)
New Glucometer, strips and lancets orders placed.

## 2022-07-08 DIAGNOSIS — H524 Presbyopia: Secondary | ICD-10-CM | POA: Diagnosis not present

## 2022-07-08 DIAGNOSIS — E119 Type 2 diabetes mellitus without complications: Secondary | ICD-10-CM | POA: Diagnosis not present

## 2022-07-08 DIAGNOSIS — H5213 Myopia, bilateral: Secondary | ICD-10-CM | POA: Diagnosis not present

## 2022-07-08 DIAGNOSIS — H10413 Chronic giant papillary conjunctivitis, bilateral: Secondary | ICD-10-CM | POA: Diagnosis not present

## 2022-07-09 ENCOUNTER — Other Ambulatory Visit: Payer: Self-pay | Admitting: Family Medicine

## 2022-07-09 ENCOUNTER — Ambulatory Visit (INDEPENDENT_AMBULATORY_CARE_PROVIDER_SITE_OTHER): Payer: Medicare HMO | Admitting: Pharmacist

## 2022-07-09 ENCOUNTER — Encounter: Payer: Self-pay | Admitting: Pharmacist

## 2022-07-09 VITALS — BP 116/75 | HR 80 | Ht 66.93 in | Wt 236.6 lb

## 2022-07-09 DIAGNOSIS — E118 Type 2 diabetes mellitus with unspecified complications: Secondary | ICD-10-CM

## 2022-07-09 DIAGNOSIS — E1159 Type 2 diabetes mellitus with other circulatory complications: Secondary | ICD-10-CM

## 2022-07-09 DIAGNOSIS — E1169 Type 2 diabetes mellitus with other specified complication: Secondary | ICD-10-CM

## 2022-07-09 DIAGNOSIS — E785 Hyperlipidemia, unspecified: Secondary | ICD-10-CM

## 2022-07-09 DIAGNOSIS — I152 Hypertension secondary to endocrine disorders: Secondary | ICD-10-CM | POA: Diagnosis not present

## 2022-07-09 MED ORDER — LANTUS SOLOSTAR 100 UNIT/ML ~~LOC~~ SOPN
38.0000 [IU] | PEN_INJECTOR | Freq: Every day | SUBCUTANEOUS | 11 refills | Status: DC
Start: 2022-07-09 — End: 2022-07-10

## 2022-07-09 MED ORDER — BASAGLAR KWIKPEN 100 UNIT/ML ~~LOC~~ SOPN
38.0000 [IU] | PEN_INJECTOR | Freq: Every day | SUBCUTANEOUS | 11 refills | Status: DC
Start: 2022-07-09 — End: 2022-07-09

## 2022-07-09 MED ORDER — TRULICITY 1.5 MG/0.5ML ~~LOC~~ SOAJ
1.5000 mg | SUBCUTANEOUS | 11 refills | Status: DC
Start: 2022-07-09 — End: 2022-07-22

## 2022-07-09 NOTE — Assessment & Plan Note (Signed)
Hypertension longstanding currently at goal of <130 mmHg. Medication adherence appears to be good with use of pill boxes that are stocked by daughter.  -Continued metoprolol succinate 100mg  daily

## 2022-07-09 NOTE — Progress Notes (Signed)
S:    Chief Complaint  Patient presents with   Medication Management    T2DM   67 y.o. male who presents for diabetes evaluation, education, and management.  PMH is significant for T2DM. Patient was referred and last seen by Primary Care Provider, Dr. Wynelle Link, on 01/27/22. Last seen by pharmacy clinic on 06/22/2022 where Trulicity (dulaglutide) was increased from 0.75 mg to 1.5 mg weekly.  Today, patient arrives in good spirits and presents without any assistance. Reports he was not able to start Trulicity (dulaglutide) 1.5 mg yet due to it "not being ready" at his pharmacy. He has not inquired about FreeStyle Zalma cost and was not able to pick up his new glucometer. Reports he has not had insulin in a week due to his new insurance not covering Basaglar (insulin glargine). Blood glucose elevated today in clinic 272 mg/dL.  Patient reports Diabetes was diagnosed in 12/2018  Family/Social History: Daughter assists with medication boxes - fills two weeks at a time.   Farxiga (dapagliflozin) 5 mg daily, Trulicity (dulaglutide) 0.75 mg weekly, Basaglar (insulin glargine) 38 units daily (has not had insulin ~ 1 week) Current hypertension medications include: Metoprolol-XL 100 mg daily Current hyperlipidemia medications include: atorvastatin 80mg  and ezetimibe 10mg   Patient reports adherence to taking all medications as prescribed, except insulin.   Insurance coverage: BCBS  Patient denies hypoglycemic events.  Patient reports neuropathy (nerve pain).  Patient-reported exercise habits: Increased slightly since previous.    O:  Review of Systems  All other systems reviewed and are negative.   Physical Exam Constitutional:      Appearance: Normal appearance.  Pulmonary:     Effort: Pulmonary effort is normal.  Neurological:     Mental Status: He is alert.  Psychiatric:        Mood and Affect: Mood normal.        Behavior: Behavior normal.     Lab Results  Component Value Date    HGBA1C 10.4 (A) 03/30/2022   Vitals:   07/09/22 0958  BP: 116/75  Pulse: 80  SpO2: 95%    Lipid Panel     Component Value Date/Time   CHOL 194 11/05/2020 1545   TRIG 289 (H) 11/05/2020 1545   HDL 41 11/05/2020 1545   CHOLHDL 4.7 11/05/2020 1545   CHOLHDL 6.6 12/24/2018 0214   VLDL 25 12/24/2018 0214   LDLCALC 104 (H) 11/05/2020 1545   LDLDIRECT 58 02/21/2021 1007    Clinical Atherosclerotic Cardiovascular Disease (ASCVD): Yes  The ASCVD Risk score (Arnett DK, et al., 2019) failed to calculate for the following reasons:   The patient has a prior MI or stroke diagnosis   A/P: Diabetes longstanding currently adherent with medications but control is unclear. Patient is able to verbalize appropriate hypoglycemia management plan. Medication adherence appears improved.  -Discontinued basal insulin Basaglar (insulin glargine) and changed to Lantus (insulin glargine) 38 units daily due to insurance coverage preference. -Increased dose of GLP-1 Trulicity (dulaglutide) from 0.75 mg to 1.5 mg weekly. -Continued SGLT2-I  Farxiga (dapagliflozin) 5mg  daily. Plan to increase dose from 5 to 10mg  at next visit. Counseled on sick day rules. -Contacted [facility] about previously prescribed Libre 3 sensors. PA in process. [name] will come to follow up visit in 1 month for placement and education.  -Patient educated on purpose, proper use, and potential adverse effects. -Extensively discussed pathophysiology of diabetes, recommended lifestyle interventions, dietary effects on blood sugar control.  -Counseled on s/sx of and management of  hypoglycemia.   ASCVD risk - secondary prevention in patient with diabetes. Last LDL is at goal of <70 mg/dL with direct LDL in 02/6107 - 58mg /dl. Given history of CAD with PCI = high intensity statin indicated.  -Continued atorvastatin 80 mg. -Continued ezetimibe 10mg  daily   Hypertension longstanding currently at goal of <130 mmHg. Medication adherence  appears to be good with use of pill boxes that are stocked by daughter.  -Continued metoprolol succinate 100mg  daily  Written patient instructions provided. Patient verbalized understanding of treatment plan.  Total time in face to face counseling 30 minutes.    Follow-up:  Pharmacist 1 month.  PCP clinic visit: 1 week Patient seen with [name] Plume, PharmD Candidate and Valeda Malm, PharmD, PGY2 Pharmacy Resident.

## 2022-07-09 NOTE — Progress Notes (Signed)
Reviewed and agree with Dr Koval's plan.   

## 2022-07-09 NOTE — Assessment & Plan Note (Signed)
ASCVD risk - secondary prevention in patient with diabetes. Last LDL is at goal of <70 mg/dL with direct LDL in 02/6107 - 58mg /dl. Given history of CAD with PCI = high intensity statin indicated.  -Continued atorvastatin 80 mg. -Continued ezetimibe 10mg  daily

## 2022-07-09 NOTE — Patient Instructions (Signed)
It was nice to see you today!  Your goal blood sugar is 80-130 before eating and less than 180 after eating.  Medication Changes: Start Lantus 38 units daily. This is your new insulin that will replace Basaglar.  Increase Trulicity to 1.5 mg weekly.   Please pick up your new sensors.   Monitor blood sugars at home and keep a log (glucometer or piece of paper) to bring with you to your next visit.  Keep up the good work with diet and exercise. Aim for a diet full of vegetables, fruit and lean meats (chicken, [place], fish). Try to limit salt intake by eating fresh or frozen vegetables (instead of canned), rinse canned vegetables prior to cooking and do not add any additional salt to meals.

## 2022-07-09 NOTE — Assessment & Plan Note (Signed)
Diabetes longstanding currently adherent with medications but control is unclear. Patient is able to verbalize appropriate hypoglycemia management plan. Medication adherence appears improved.  -Discontinued basal insulin Basaglar (insulin glargine) and changed to Lantus (insulin glargine) 38 units daily due to insurance coverage preference. -Increased dose of GLP-1 Trulicity (dulaglutide) from 0.75 mg to 1.5 mg weekly. -Continued SGLT2-I  Farxiga (dapagliflozin) 5mg  daily. Plan to increase dose from 5 to 10mg  at next visit. Counseled on sick day rules. -Contacted [facility] about previously prescribed Libre 3 sensors. PA in process. [name] will come to follow up visit in 1 month for placement and education.  -Patient educated on purpose, proper use, and potential adverse effects. -Extensively discussed pathophysiology of diabetes, recommended lifestyle interventions, dietary effects on blood sugar control.  -Counseled on s/sx of and management of hypoglycemia.

## 2022-07-10 ENCOUNTER — Telehealth: Payer: Self-pay

## 2022-07-10 NOTE — Telephone Encounter (Signed)
A Prior Authorization was initiated for this patients FREESTYLE LIBRE 3 SENSOR through CoverMyMeds.   Key: [id number]

## 2022-07-13 NOTE — Telephone Encounter (Signed)
Prior Auth for patients medication FREESTYLE LIBRE 3 SENSOR approved by [employer] from 02/16/22 to 03/18/22.  CoverMyMeds Key: [id number] PA Case ID #: [id number]

## 2022-07-20 ENCOUNTER — Ambulatory Visit (INDEPENDENT_AMBULATORY_CARE_PROVIDER_SITE_OTHER): Payer: Medicare HMO | Admitting: Family Medicine

## 2022-07-20 ENCOUNTER — Encounter: Payer: Self-pay | Admitting: Family Medicine

## 2022-07-20 VITALS — BP 123/80 | HR 79 | Ht 66.0 in | Wt 241.0 lb

## 2022-07-20 DIAGNOSIS — E1169 Type 2 diabetes mellitus with other specified complication: Secondary | ICD-10-CM | POA: Diagnosis not present

## 2022-07-20 DIAGNOSIS — E118 Type 2 diabetes mellitus with unspecified complications: Secondary | ICD-10-CM

## 2022-07-20 DIAGNOSIS — E785 Hyperlipidemia, unspecified: Secondary | ICD-10-CM | POA: Diagnosis not present

## 2022-07-20 DIAGNOSIS — E1142 Type 2 diabetes mellitus with diabetic polyneuropathy: Secondary | ICD-10-CM

## 2022-07-20 LAB — POCT GLYCOSYLATED HEMOGLOBIN (HGB A1C): HbA1c, POC (controlled diabetic range): 12.3 % — AB (ref 0.0–7.0)

## 2022-07-20 MED ORDER — PREGABALIN 75 MG PO CAPS: ORAL_CAPSULE | ORAL | 2 refills | Status: DC

## 2022-07-20 MED ORDER — DAPAGLIFLOZIN PROPANEDIOL 10 MG PO TABS: 10.0000 mg | ORAL_TABLET | Freq: Every day | ORAL | 2 refills | Status: DC

## 2022-07-20 NOTE — Progress Notes (Signed)
SUBJECTIVE:   CHIEF COMPLAINT / HPI:    Patient here alone today.  Patient was last seen in pharmacy clinic about 2 weeks ago.  At that time he was continued on basal insulin 38 units daily, Trulicity was increased to 1.5 mg weekly.  Continued on Farxiga 5 mg daily.  Still having some bilateral foot pain, he has tingling pain in his bilateral feet which does travel to his because travel to his calves. He has continued to take the pregabalin 50 mg TID with some relief. He has been taking Tylenol with it which has been helping. Not doing much on his feet, sometimes mows the lawn or works on his truck. He has tried an over-the-counter shoe insert which does help for about half the day. Denies claudication.  Has been out of insulin for 2-3 weeks due to cost but should be able to pick up today. Daughter has signed him up for some financial assistance program. Denies symptomatic hypoglycemia. His daughter helps manage his medications and will check his blood sugars.  PERTINENT  PMH / PSH: CAD s/p PCI, T2DM, HTN  Patient Care Team: Littie Deeds, MD as PCP - General (Family Medicine) Lennette Bihari, MD as PCP - Cardiology (Cardiology)   OBJECTIVE:   BP 123/80   Pulse 79   Ht 5\' 6"  (1.676 m)   Wt 241 lb (109.3 kg)   SpO2 100%   BMI 38.90 kg/m   Physical Exam Constitutional:      General: He is not in acute distress. Cardiovascular:     Rate and Rhythm: Normal rate and regular rhythm.  Pulmonary:     Effort: Pulmonary effort is normal. No respiratory distress.     Breath sounds: Normal breath sounds.  Musculoskeletal:     Comments: Bilateral feet without obvious deformities or swelling.  2+ PT and DP pulses bilaterally.  Neurological:     Mental Status: He is alert.         07/20/2022    9:01 AM  Depression screen PHQ 2/9  Decreased Interest 0  Down, Depressed, Hopeless 0  PHQ - 2 Score 0  Altered sleeping 0  Tired, decreased energy 0  Change in appetite 0  Feeling  bad or failure about yourself  0  Trouble concentrating 0  Moving slowly or fidgety/restless 0  Suicidal thoughts 0  PHQ-9 Score 0     {Show previous vital signs (optional):23777}  Last hemoglobin A1c Lab Results  Component Value Date   HGBA1C 12.3 (A) 07/20/2022      ASSESSMENT/PLAN:   Problem List Items Addressed This Visit       Endocrine   Type 2 diabetes mellitus with complication (HCC) - Primary    Uncontrolled, worsening likely secondary to medication nonadherence related to financial barriers- he has been without insulin for at least several weeks over the past few months. - continue Lantus 38 units daily - continue dulaglutide 1.5 mg weekly - increase dapagliflozin to 10 mg daily - f/u in pharmacy visit as scheduled      Relevant Medications   dapagliflozin propanediol (FARXIGA) 10 MG TABS tablet   Other Relevant Orders   HgB A1c (Completed)   Basic Metabolic Panel   Hyperlipidemia associated with type 2 diabetes mellitus (HCC), LDL goal < 70   Relevant Medications   dapagliflozin propanediol (FARXIGA) 10 MG TABS tablet   Other Relevant Orders   Lipid Panel   Diabetic neuropathy (HCC)    Still having bilateral foot  pain likely due to diabetic neuropathy. - increase pregabalin to 75 mg TID prn - advised can continue acetaminophen prn - consider ABI if not improving - advised to return if not improving or worsening      Relevant Medications   pregabalin (LYRICA) 75 MG capsule   dapagliflozin propanediol (FARXIGA) 10 MG TABS tablet       Return in about 3 months (around 10/20/2022) for f/u diabetes.   Littie Deeds, MD [facility]

## 2022-07-20 NOTE — Assessment & Plan Note (Signed)
Uncontrolled, worsening likely secondary to medication nonadherence related to financial barriers- he has been without insulin for at least several weeks over the past few months. - continue Lantus 38 units daily - continue dulaglutide 1.5 mg weekly - increase dapagliflozin to 10 mg daily - f/u in pharmacy visit as scheduled

## 2022-07-20 NOTE — Patient Instructions (Addendum)
It was nice seeing you today!  Increase Lyrica to 75 mg up to 3 times a day as needed. You can try just 2 times a day starting out.  Increase Farxiga to 10 mg once a day.  Stay well, Littie Deeds, MD [facility] [phone]  --  Make sure to check out at the front desk before you leave today.  Please arrive at least 15 minutes prior to your scheduled appointments.  If you had blood work today, I will send you a MyChart message or a letter if results are normal. Otherwise, I will give you a call.  If you had a referral placed, they will call you to set up an appointment. Please give [place] a call if you don't hear back in the next 2 weeks.  If you need additional refills before your next appointment, please call your pharmacy first.

## 2022-07-20 NOTE — Assessment & Plan Note (Addendum)
Still having bilateral foot pain likely due to diabetic neuropathy. - increase pregabalin to 75 mg TID prn - advised can continue acetaminophen prn - consider ABI if not improving - advised to return if not improving or worsening

## 2022-07-21 ENCOUNTER — Encounter: Payer: Self-pay | Admitting: Family Medicine

## 2022-07-21 LAB — BASIC METABOLIC PANEL
BUN/Creatinine Ratio: 15 (ref 10–24)
BUN: 16 mg/dL (ref 8–27)
CO2: 21 mmol/L (ref 20–29)
Calcium: 9.2 mg/dL (ref 8.6–10.2)
Chloride: 103 mmol/L (ref 96–106)
Creatinine, Ser: 1.1 mg/dL (ref 0.76–1.27)
Glucose: 264 mg/dL — ABNORMAL HIGH (ref 70–99)
Potassium: 4.4 mmol/L (ref 3.5–5.2)
Sodium: 139 mmol/L (ref 134–144)
eGFR: 74 mL/min/{1.73_m2} (ref >59)

## 2022-07-21 LAB — LIPID PANEL
Chol/HDL Ratio: 3.4 ratio (ref 0.0–5.0)
Cholesterol, Total: 119 mg/dL (ref 100–199)
HDL: 35 mg/dL — ABNORMAL LOW (ref >39)
LDL Chol Calc (NIH): 65 mg/dL (ref 0–99)
Triglycerides: 103 mg/dL (ref 0–149)
VLDL Cholesterol Cal: 19 mg/dL (ref 5–40)

## 2022-07-22 ENCOUNTER — Telehealth: Payer: Self-pay

## 2022-07-22 ENCOUNTER — Telehealth: Payer: Self-pay | Admitting: Family Medicine

## 2022-07-22 DIAGNOSIS — E118 Type 2 diabetes mellitus with unspecified complications: Secondary | ICD-10-CM

## 2022-07-22 MED ORDER — TRULICITY 1.5 MG/0.5ML ~~LOC~~ SOAJ
1.5000 mg | SUBCUTANEOUS | 11 refills | Status: DC
Start: 2022-07-22 — End: 2022-08-17

## 2022-07-22 NOTE — Telephone Encounter (Signed)
[name] calls nurse line regarding issues with insulin prescriptions.   Called pharmacy. They state that they need new prescription for Trulicity. Pharmacist states that current prescription is written in mL and they need it written in mg. Advised that last prescription on 5/23 was written in mg for dosing. Requesting that prescription be resent.   Lantus is not covered by insurance. Patient has [employer]. Pharmacist is unable to provide with preferred alternatives.   Will forward to Dr. Raymondo Band for further assistance.   Veronda Prude, RN

## 2022-07-22 NOTE — Telephone Encounter (Signed)
Called [employer]. Lantus is covered by insurance. Patient picked this up on 07/20/22.  New prescription needed for Trulicity. Received verbal order from Dr. McDiarmid to resend Trulicity prescription.   Called patient's daughter. She states that he did not receive any insulin when he picked up prescriptions on 6/3. She states that he only received CGM supplies.   Conference called pharmacy. They state the same information to patient's daughter.   Daughter will check with patient to see if there were any additional boxes in prescription bag.   She is worried as he has been out of insulin and does not know how to proceed.   Please advise.   Veronda Prude, RN

## 2022-07-22 NOTE — Telephone Encounter (Signed)
Daughter returns call to nurse line. She states that patient was able to find insulin vial, however, this was never refrigerated.   If medication can still be used, she is requesting needles and syringes for patient to be able to administer.   Veronda Prude, RN

## 2022-07-22 NOTE — Telephone Encounter (Signed)
Contacted Keith Newton to schedule their annual wellness visit. Welcome to Medicare visit Due by 11/17/2022.  Thank you,  [facility] Support [facility] Direct dial  [phone]

## 2022-07-23 ENCOUNTER — Telehealth: Payer: Self-pay | Admitting: Pharmacist

## 2022-07-23 DIAGNOSIS — E118 Type 2 diabetes mellitus with unspecified complications: Secondary | ICD-10-CM

## 2022-07-23 MED ORDER — LANTUS SOLOSTAR 100 UNIT/ML ~~LOC~~ SOPN
38.0000 [IU] | PEN_INJECTOR | Freq: Every day | SUBCUTANEOUS | 11 refills | Status: DC
Start: 2022-07-23 — End: 2022-12-17

## 2022-07-23 NOTE — Telephone Encounter (Signed)
Contacted patient's daughter regarding Lantus (insulin glargine) vial being sent in instead of SoloStar pen. She states she has been administering insulin via the vial for her father.   Spoke with [facility] earlier who stated that next Lantus (insulin glargine) fill that insurance will approve is 08/12/2022. Sent in updated prescription, which [facility] states is $70/box; however, they are out of stock at the moment.   Attempted to update daughter regarding cost of pens; however, she did not answer and a voicemail was left.

## 2022-08-13 ENCOUNTER — Ambulatory Visit: Payer: Medicare HMO | Admitting: Pharmacist

## 2022-08-17 ENCOUNTER — Other Ambulatory Visit: Payer: Self-pay

## 2022-08-17 DIAGNOSIS — E118 Type 2 diabetes mellitus with unspecified complications: Secondary | ICD-10-CM

## 2022-08-17 MED ORDER — TRULICITY 1.5 MG/0.5ML ~~LOC~~ SOAJ: 1.5000 mg | SUBCUTANEOUS | 11 refills | Status: DC

## 2022-08-24 ENCOUNTER — Other Ambulatory Visit: Payer: Self-pay | Admitting: Family Medicine

## 2022-08-24 DIAGNOSIS — E1142 Type 2 diabetes mellitus with diabetic polyneuropathy: Secondary | ICD-10-CM

## 2022-08-26 ENCOUNTER — Encounter: Payer: Self-pay | Admitting: Pharmacist

## 2022-08-26 ENCOUNTER — Other Ambulatory Visit: Payer: Self-pay | Admitting: Family Medicine

## 2022-08-26 ENCOUNTER — Ambulatory Visit (INDEPENDENT_AMBULATORY_CARE_PROVIDER_SITE_OTHER): Payer: Medicare HMO | Admitting: Pharmacist

## 2022-08-26 VITALS — BP 130/83 | HR 92 | Wt 238.2 lb

## 2022-08-26 DIAGNOSIS — E1142 Type 2 diabetes mellitus with diabetic polyneuropathy: Secondary | ICD-10-CM

## 2022-08-26 DIAGNOSIS — E118 Type 2 diabetes mellitus with unspecified complications: Secondary | ICD-10-CM | POA: Diagnosis not present

## 2022-08-26 MED ORDER — PREGABALIN 75 MG PO CAPS
ORAL_CAPSULE | ORAL | 2 refills | Status: DC
Start: 2022-08-26 — End: 2022-08-26

## 2022-08-26 MED ORDER — PREGABALIN 75 MG PO CAPS: ORAL_CAPSULE | ORAL | 2 refills | Status: DC

## 2022-08-26 NOTE — Assessment & Plan Note (Signed)
Diabetes longstanding and currently doing better with assistance of daughter. Patient is able to verbalize appropriate hypoglycemia management plan. Medication adherence appears improved with assistance of daughter.   -Continued basal insulin Lantus (insulin glargine) at 40 units once daily.  -Continued GLP-1 Trulicity (dulaglutide)  1.5 mg weekly. -Continued SGLT2-I Farxiga (dapagliflozin) 10mg   -Patient educated on purpose, proper use, and potential adverse effects.  -Extensively discussed pathophysiology of diabetes, recommended lifestyle interventions, dietary effects on blood sugar control.  -Counseled on s/sx of and management of hypoglycemia.  -CGM inititated  Total time in face to face counseling with CGM application, education and installation 37 minutes.   Application / phone OS upgrade to be completed by daughter who has also assists her mother with [employer]ine [employer] App.

## 2022-08-26 NOTE — Progress Notes (Signed)
Reviewed and agree with Dr Koval's plan.   

## 2022-08-26 NOTE — Patient Instructions (Signed)
It was nice to see you today!  Your goal blood sugar is 80-130 before eating and less than 180 after eating.  Medication Changes: Continue all medications the same at this time.    Monitor blood sugars at home and keep a log (glucometer or piece of paper) to bring with you to your next visit.  Keep up the good work with diet and exercise. Aim for a diet full of vegetables, fruit and lean meats (chicken, [place], fish). Try to limit salt intake by eating fresh or frozen vegetables (instead of canned), rinse canned vegetables prior to cooking and do not add any additional salt to meals.

## 2022-08-26 NOTE — Progress Notes (Signed)
S:     Chief Complaint  Patient presents with   Medication Management    Diabetes - CGM initiation - Libre 3   67 y.o. male who presents for diabetes evaluation, education, and management. PMH is significant for T2DM. Patient was referred and last seen by Primary Care Provider, Dr. Wynelle Link, on 07/20/2022.  Last seen by pharmacy clinic on 07/09/2022.   .   Today, patient arrives in good spirits and presents without any assistance. He is accompanied by his daughter, Vianne Bulls and wife.  Reports he has started Trulicity (dulaglutide) 1.5 mg weekly on Saturday or Sunday.   FreeStyle Libre 3 was picked up from pharmacy on 07/20/2022.  Visit today was to focus on Libre 3 CGM initiation.     Patient reports Diabetes was diagnosed in 12/2018   Family/Social History: Daughter assists with medication boxes - fills two weeks at a time.    Farxiga (dapagliflozin) 5 mg daily, Trulicity (dulaglutide) 1.5 mg weekly,  Lantus (insulin glargine) 40 units daily Current hypertension medications include: Metoprolol-XL 100 mg daily Current hyperlipidemia medications include: atorvastatin 80mg  and ezetimibe 10mg    Patient and daughter report adherence to taking all medications as prescribed.   Insurance coverage: BC[employer]   Patient denies hypoglycemic events.   Patient reports neuropathy (nerve pain) has worsened since stopping pregabalin. Patient reports decrease in nocturia from 4x per night to 1-2 per night.    Patient-reported exercise habits: Increased slightly since previous   O:   Review of Systems  Musculoskeletal:  Positive for joint pain (left shoulder pain).  All other systems reviewed and are negative.  Physical Exam Constitutional:      Appearance: Normal appearance.  Pulmonary:     Effort: Pulmonary effort is normal.  Neurological:     Mental Status: He is alert.  Psychiatric:        Mood and Affect: Mood normal.        Behavior: Behavior normal.        Thought Content: Thought  content normal.       Lab Results  Component Value Date   HGBA1C 12.3 (A) 07/20/2022   Vitals:   08/26/22 0943  BP: 130/83  Pulse: 92  SpO2: 100%    Lipid Panel     Component Value Date/Time   CHOL 119 07/20/2022 1111   TRIG 103 07/20/2022 1111   HDL 35 (L) 07/20/2022 1111   CHOLHDL 3.4 07/20/2022 1111   CHOLHDL 6.6 12/24/2018 0214   VLDL 25 12/24/2018 0214   LDLCALC 65 07/20/2022 1111   LDLDIRECT 58 02/21/2021 1007    Clinical Atherosclerotic Cardiovascular Disease (ASCVD): Yes  The ASCVD Risk score (Arnett DK, et al., 2019) failed to calculate for the following reasons:   The patient has a prior MI or stroke diagnosis    A/P: Diabetes longstanding and currently doing better with assistance of daughter. Patient is able to verbalize appropriate hypoglycemia management plan. Medication adherence appears improved with assistance of daughter.   -Continued basal insulin Lantus (insulin glargine) at 40 units once daily.  -Continued GLP-1 Trulicity (dulaglutide)  1.5 mg weekly. -Continued SGLT2-I Farxiga (dapagliflozin) 10mg   -Patient educated on purpose, proper use, and potential adverse effects.  -Extensively discussed pathophysiology of diabetes, recommended lifestyle interventions, dietary effects on blood sugar control.  -Counseled on s/sx of and management of hypoglycemia.  -CGM inititated  Diabetic neuropathy - requested refill pregabalin Refilled by Dr. McDiarmid today - attending physician.   Hypertension longstanding and currently controlled. Blood  pressure at 130/83 mmHg. Medication adherence good.  -Continued metoprolol 100mg .  Written patient instructions provided. Patient verbalized understanding of treatment plan.  Total time in face to face counseling with CGM application, education and installation 37 minutes.   Application / phone OS upgrade to be completed by daughter who has also assists her mother with Josephine I[employer] App.  Follow-up:  Pharmacist 4-6  weeks. PCP clinic visit in the next month to meet new doctor.  Prefers male doctor and will request new male provider on the way out of the building today.

## 2022-09-01 ENCOUNTER — Ambulatory Visit: Payer: Medicare HMO | Admitting: Family Medicine

## 2022-11-06 ENCOUNTER — Telehealth: Payer: Self-pay

## 2022-11-06 NOTE — Telephone Encounter (Signed)
Patient enrolled with [place] patient assistance thru 02/16/23 for [place].   Rec'd fax from [employer]&ME. Refill request for [place].   Could a 90 day supply of medication (w/ refills) be e-scribed to [facility]?

## 2022-11-09 ENCOUNTER — Other Ambulatory Visit: Payer: Self-pay | Admitting: Family Medicine

## 2022-11-09 DIAGNOSIS — E118 Type 2 diabetes mellitus with unspecified complications: Secondary | ICD-10-CM

## 2022-11-09 MED ORDER — DAPAGLIFLOZIN PROPANEDIOL 10 MG PO TABS
10.0000 mg | ORAL_TABLET | Freq: Every day | ORAL | 3 refills | Status: AC
Start: 2022-11-09 — End: ?

## 2022-11-09 NOTE — Progress Notes (Signed)
Notified by pharmacy that patient requires Farxiga prescription to be sent to [facility] through [place]&ME patient assistance, which she has until 02/16/23.  Placed prescription with refills.

## 2022-11-12 NOTE — Telephone Encounter (Signed)
30 day supply w/ 3 refills sent.

## 2022-11-19 ENCOUNTER — Ambulatory Visit: Payer: Medicare HMO | Admitting: Family Medicine

## 2022-11-19 ENCOUNTER — Telehealth: Payer: Self-pay

## 2022-11-19 NOTE — Progress Notes (Deleted)
    SUBJECTIVE:   Chief compliant/HPI: annual examination  Keith Newton is a 67 y.o. who presents today for an annual exam.   History tabs reviewed and updated ***.  Review of systems form reviewed and notable for ***.   OBJECTIVE:   There were no vitals taken for this visit.  ***  ASSESSMENT/PLAN:   No problem-specific Assessment & Plan notes found for this encounter.    Annual Examination  See AVS for age appropriate recommendations.  PHQ score ***, reviewed and discussed.  Blood pressure value is *** goal, discussed.   Considered the following screening exams based upon USPSTF recommendations: Diabetes screening: {discussed/ordered:14545} Screening for elevated cholesterol: {discussed/ordered:14545} HIV testing: {discussed/ordered:14545} Hepatitis C: {discussed/ordered:14545} Hepatitis B: {discussed/ordered:14545} Syphilis if at high risk: {discussed/ordered:14545} Reviewed risk factors for latent tuberculosis and {not indicated/requested/declined:14582} Colorectal cancer screening: {crcscreen:23821::"discussed, colonoscopy ordered"} Lung cancer screening: {discussed/declined/written JYNW:29562} See documentation below regarding discussion and indication.  PSA discussed and after engaging in discussion of possible risks, benefits and complications of screening patient elected to ***.   Follow up in 1 year or sooner if indicated.    Keith Newton Keith Dove, MD [facility]

## 2022-11-19 NOTE — Telephone Encounter (Signed)
Spoke with patient and patient's daughter and rescheduled appointment to 12/10/2022 at 1:45pm with Dr. [name].   Drusilla Kanner, CMA

## 2022-11-19 NOTE — Telephone Encounter (Signed)
-----   Message from [employer] sent at 11/19/2022  3:20 PM EDT ----- Missed appointment today, no show.  Please reach out to patient and reschedule as able, needs to complete some preventative health screening items.

## 2022-12-10 ENCOUNTER — Ambulatory Visit: Payer: Medicare HMO | Admitting: Family Medicine

## 2022-12-11 ENCOUNTER — Telehealth: Payer: Self-pay | Admitting: Orthopedic Surgery

## 2022-12-11 NOTE — Telephone Encounter (Signed)
Pt daughter called requesting documentation of services pt received from our office. Pt daughter will come in to pick up. Cb # [phone]

## 2022-12-15 ENCOUNTER — Ambulatory Visit: Payer: Medicare HMO | Admitting: Family Medicine

## 2022-12-15 NOTE — Telephone Encounter (Signed)
IC, lmvm advising that the patient needs to sign authorization to release medical records.

## 2022-12-17 ENCOUNTER — Encounter: Payer: Self-pay | Admitting: Student

## 2022-12-17 ENCOUNTER — Ambulatory Visit (INDEPENDENT_AMBULATORY_CARE_PROVIDER_SITE_OTHER): Payer: Medicare HMO | Admitting: Student

## 2022-12-17 ENCOUNTER — Other Ambulatory Visit: Payer: Self-pay

## 2022-12-17 VITALS — BP 136/90 | HR 89 | Ht 66.0 in | Wt 237.2 lb

## 2022-12-17 DIAGNOSIS — E1142 Type 2 diabetes mellitus with diabetic polyneuropathy: Secondary | ICD-10-CM | POA: Diagnosis not present

## 2022-12-17 DIAGNOSIS — E118 Type 2 diabetes mellitus with unspecified complications: Secondary | ICD-10-CM | POA: Diagnosis not present

## 2022-12-17 LAB — POCT GLYCOSYLATED HEMOGLOBIN (HGB A1C): HbA1c, POC (controlled diabetic range): 12.4 % — AB (ref 0.0–7.0)

## 2022-12-17 MED ORDER — LANTUS SOLOSTAR 100 UNIT/ML ~~LOC~~ SOPN
40.0000 [IU] | PEN_INJECTOR | Freq: Every day | SUBCUTANEOUS | 1 refills | Status: DC
Start: 2022-12-17 — End: 2022-12-24

## 2022-12-17 MED ORDER — PREGABALIN 75 MG PO CAPS
ORAL_CAPSULE | ORAL | 2 refills | Status: DC
Start: 2022-12-17 — End: 2022-12-24

## 2022-12-17 MED ORDER — TRULICITY 1.5 MG/0.5ML ~~LOC~~ SOAJ
1.5000 mg | SUBCUTANEOUS | 11 refills | Status: DC
Start: 2022-12-17 — End: 2023-02-03

## 2022-12-17 NOTE — Progress Notes (Unsigned)
    SUBJECTIVE:   CHIEF COMPLAINT / HPI:   Diabetes Here with his daughter for follow-up of his diabetes.  Repeatedly affirms "I do not want to be here".  He and his daughter tell me that he has not taken his insulin or Trulicity in many months due to being out and "not wanting to."  PERTINENT  PMH / PSH: CAD status post PCI, hypertension, diabetes (poorly controlled)  OBJECTIVE:   There were no vitals taken for this visit.  ***  ASSESSMENT/PLAN:   No problem-specific Assessment & Plan notes found for this encounter.     Eliezer Mccoy, MD [facility]

## 2022-12-17 NOTE — Patient Instructions (Addendum)
I want you to come back and see Dr. Raymondo Band in about 2 weeks. Hopefully you will have your insulin at that time. I want you to keep a log of morning (fasting) and afternoon (after eating) sugars. If you change your mind about the CGM, let us know. If you have any low values (<70) please call us right away.   I am refilling your Insulin and your Trulicity today. Please pick these up and get back on them as soon as possible so we can start making changes.   Eliezer Mccoy, MD

## 2022-12-18 ENCOUNTER — Telehealth: Payer: Self-pay | Admitting: Cardiovascular Disease

## 2022-12-18 DIAGNOSIS — E118 Type 2 diabetes mellitus with unspecified complications: Secondary | ICD-10-CM | POA: Diagnosis not present

## 2022-12-18 LAB — BASIC METABOLIC PANEL
BUN/Creatinine Ratio: 15 (ref 10–24)
BUN: 15 mg/dL (ref 8–27)
CO2: 19 mmol/L — ABNORMAL LOW (ref 20–29)
Calcium: 10 mg/dL (ref 8.6–10.2)
Chloride: 102 mmol/L (ref 96–106)
Creatinine, Ser: 1.01 mg/dL (ref 0.76–1.27)
Glucose: 328 mg/dL — ABNORMAL HIGH (ref 70–99)
Potassium: 4.3 mmol/L (ref 3.5–5.2)
Sodium: 138 mmol/L (ref 134–144)
eGFR: 82 mL/min/{1.73_m2} (ref >59)

## 2022-12-18 NOTE — Telephone Encounter (Signed)
Pt daughter states pt needs a letter for insurance and his bank about when he had a heart attack.

## 2022-12-19 NOTE — Assessment & Plan Note (Addendum)
A1c grossly elevated at 12.4% today. A bit of a challenging interaction today given that he was here at his daughter's urging and against his desires.  He is able to tell me that he is taking both his [place], Lipitor, and Aspirin.  - We will re-order and restart his Lantus 40 units daily and Trulicity 1.5mg  weekly - Offered CGM which he ardently declines. His daughter will urge him to keep a home glucose log and bring it in to his follow-up with Dr. Raymondo Band - Scheduled for follow-up with Dr. Raymondo Band in 2 weeks - Refilled his pregabalin for his neuropathic pain  - BMP today, he declines to leave a urine for UACR. His daughter will try to get him to collect this at home and bring it back to clinic tomorrow.

## 2022-12-20 LAB — MICROALBUMIN / CREATININE URINE RATIO
Creatinine, Urine: 68.9 mg/dL
Microalb/Creat Ratio: <4 mg/g{creat} (ref 0–29)
Microalbumin, Urine: <3 ug/mL

## 2022-12-21 NOTE — Telephone Encounter (Signed)
Spoke with patient and he state needs letter from when he had his STEMI. in 2020. The letter needs to states what procedure was done, how long he was in the hospital, all the procedures he had done pertaining to his STEMI. He states at the time of his STEMI he was still paying insurance and he was not supposed to because he was retired. They are going to send him a refund but need this information

## 2022-12-24 ENCOUNTER — Telehealth: Payer: Self-pay

## 2022-12-24 ENCOUNTER — Ambulatory Visit: Payer: Medicare HMO | Admitting: Pharmacist

## 2022-12-24 ENCOUNTER — Encounter: Payer: Self-pay | Admitting: Pharmacist

## 2022-12-24 VITALS — BP 135/87 | HR 88 | Wt 236.2 lb

## 2022-12-24 DIAGNOSIS — E118 Type 2 diabetes mellitus with unspecified complications: Secondary | ICD-10-CM | POA: Diagnosis not present

## 2022-12-24 DIAGNOSIS — E1142 Type 2 diabetes mellitus with diabetic polyneuropathy: Secondary | ICD-10-CM

## 2022-12-24 MED ORDER — ACCU-CHEK GUIDE VI STRP: ORAL_STRIP | 12 refills | Status: DC

## 2022-12-24 MED ORDER — NITROGLYCERIN 0.4 MG SL SUBL: 0.4000 mg | SUBLINGUAL_TABLET | SUBLINGUAL | 3 refills | Status: DC | PRN

## 2022-12-24 MED ORDER — PREGABALIN 75 MG PO CAPS: ORAL_CAPSULE | ORAL | 2 refills | Status: DC

## 2022-12-24 MED ORDER — ACCU-CHEK GUIDE W/DEVICE KIT: PACK | 0 refills | Status: DC

## 2022-12-24 MED ORDER — TRESIBA FLEXTOUCH 100 UNIT/ML ~~LOC~~ SOPN: PEN_INJECTOR | SUBCUTANEOUS | 99 refills | Status: DC

## 2022-12-24 NOTE — Progress Notes (Signed)
S:     Chief Complaint  Patient presents with   Medication Management    T2DM follow-up   67 y.o. male who presents for diabetes evaluation, education, and management. Patient arrives in fair spirits, hard to engage initially. Patient is accompanied by sister on the phone. Patient reports increased blurry vision, constant nerve pain (burning and tingling) in feet, and fatigue.   Patient was last seen by Primary Care Provider, Dr. Marisue Humble, on 12/17/22.   PMH is significant for T2DM, HTN, HLD.  At last visit, discussed medication adherence. Continued Trulicity (dulaglutide) 1.5 mg SubQ weekly, Farxiga (dapagliflozin) 10 mg daily, and Lantus (insulin glargine) 40 units subQ daily.   Patient reports Diabetes was diagnosed in November 2020.   Current diabetes medications include: Trulicity (dulaglutide) 1.5 mg SubQ weekly, Farxiga (dapagliflozin) 10 mg daily, and Lantus (insulin glargine) 22 units subQ daily -- confusion with dosing instructions.  Current hypertension medications include: isosorbide mononitrate 30mg  daily, metoprolol 100 mg daily at bedtime. Current hyperlipidemia medications include: atorvastatin 80 mg daily.  Patient and daughter both report adherence to taking all medications as prescribed.  Insurance coverage: [employer]   Patient denies hypoglycemic events. Unable to check blood sugars at home because glucometer was not working, getting replacement at night.   Patient reports nocturia 1-3 times nightly (nighttime urination).  Patient reports neuropathy (nerve pain) all day.  Patient reports visual changes (blurry vision), sees an eye doctor yearly. Patient reports self foot exams, no open spots or sores.   Patient reported dietary habits: Eats 2 meals/day - "anything I want".  Patient-reported exercise habits: Hesitant to get active, planning to retry swimming once this month or using fitness center with [employer].    O:   Review of  Systems  Eyes:  Positive for blurred vision.  Neurological:  Positive for tingling (Tingling and burning in feet).  All other systems reviewed and are negative.   Physical Exam Constitutional:      Appearance: Normal appearance.  Pulmonary:     Effort: Pulmonary effort is normal.  Neurological:     Mental Status: He is alert.  Psychiatric:        Thought Content: Thought content normal.        Judgment: Judgment normal.      Lab Results  Component Value Date   HGBA1C 12.4 (A) 12/17/2022   Vitals:   12/24/22 1042  BP: 135/87  Pulse: 88  SpO2: 95%    Lipid Panel     Component Value Date/Time   CHOL 119 07/20/2022 1111   TRIG 103 07/20/2022 1111   HDL 35 (L) 07/20/2022 1111   CHOLHDL 3.4 07/20/2022 1111   CHOLHDL 6.6 12/24/2018 0214   VLDL 25 12/24/2018 0214   LDLCALC 65 07/20/2022 1111   LDLDIRECT 58 02/21/2021 1007    A/P: Diabetes longstanding currently uncontrolled. Patient is able to verbalize appropriate hypoglycemia management plan. Medication adherence appears poor. Control is suboptimal due to lack of interest in taking medicines, forgetting to pick up at pharmacy, confusion on medication regimen. -- Started insulin degludec Evaristo Bury) 24 units into the skin daily, increase by 2 units daily for a goal dose of 40 units daily.  (Pen devices preferred and prescribed) -- Stopped insulin glargine (Lantus).  -- Continued all other diabetes medications. -- Ordered new glucometer supplies, discussed checking twice a day. -- Not interested in using CGM.  -Patient educated on purpose, proper use, and potential adverse effects.  -Extensively discussed pathophysiology  of diabetes, recommended lifestyle interventions, dietary effects on blood sugar control. Patient plans to try swimming at least once before next appointment.  -Counseled on s/sx of and management of hypoglycemia.   ASCVD risk - secondary prevention in patient with diabetes. Last LDL is 65 at goal of <70  mg/dL. ASCVD risk factors include T2DM, HTN. -- Continue atorvastatin 80 mg daily. - Refilled NTG sublingual due to current supply being out of date. Reviewed appropriate use.   Hypertension longstanding currently mildly uncontrolled. Blood pressure goal of <130/80 mmHg. Medication adherence is good.  -- Continue isosorbide mononitrate 30 mg daily, metoprolol 100 mg daily -- RAS agent may be appropriate in future.   Written patient instructions provided. Patient verbalized understanding of treatment plan.  Total time in face to face counseling 37 minutes.    Follow-up:  Pharmacist 01/07/23 PCP clinic visit PRN. Patient seen with Lendon Ka, PharmD Candidate and Shona Simpson, PharmD Candidate.

## 2022-12-24 NOTE — Telephone Encounter (Signed)
Spoke with patient and advised him that yall would speak with Dr Tresa Endo about if he would write it. Advised him he should receive a call later today with more information.

## 2022-12-24 NOTE — Telephone Encounter (Signed)
Daughter LVM on nurse line in regards to [place]. She reports the pharmacy told him the medication would be 200 dollars.   I called the pharmacy and Keith Newton is running through for .93 cents. He reports the total of all medications combined was ~200 dollars.   He reports Lantus is 70 dollars and Trulicity is 100 dollars.   I advised the Lantus was stopped at today's office visit.   Will send Trulicity to pharmacy team to see if PA is needed at this time or if 100 is the copay.   Updated daughter and she was appreciative.

## 2022-12-24 NOTE — Assessment & Plan Note (Signed)
Diabetes longstanding currently uncontrolled. Patient is able to verbalize appropriate hypoglycemia management plan. Medication adherence appears poor. Control is suboptimal due to lack of interest in taking medicines, forgetting to pick up at pharmacy, confusion on medication regimen. -- Started insulin degludec Evaristo Bury) 24 units into the skin daily, increase by 2 units daily for a goal dose of 40 units daily.  (Pen devices preferred and prescribed) -- Stopped insulin glargine (Lantus).  -- Continued all other diabetes medications. -- Ordered new glucometer supplies, discussed checking twice a da

## 2022-12-24 NOTE — Patient Instructions (Addendum)
It was nice to see you today!  Your goal blood sugar is 80-130 before eating and less than 180 after eating.  Medication Changes: START Tresiba (insulin degludec) 24 units into the skin daily. Increase by 2 units daily for a max dose of 40 units daily.  Continue all other medication the same.  Monitor blood sugars at home and keep a log (glucometer or piece of paper) to bring with you to your next visit.  Keep up the good work with diet and exercise. Aim for a diet full of vegetables, fruit and lean meats (chicken, [place], fish). Try to limit salt intake by eating fresh or frozen vegetables (instead of canned), rinse canned vegetables prior to cooking and do not add any additional salt to meals.

## 2022-12-24 NOTE — Telephone Encounter (Signed)
Spoke to patient's daughter advised letter verifying patient had a MI 12/23/2018 and a emergent cardiac cath with stenting left at [facility] office front desk.

## 2022-12-25 NOTE — Telephone Encounter (Signed)
Patient contacted for follow-up of cost for medication issue.  Since last contact patient's reports the cost of [facility] issue at [facility] has been resolved.   She had no additional questions RE treatment plan.  She shared that she was working on Art therapist - [employer]" process and was trying to get her father swimming again in the next few weeks.    Total time with patient call and documentation of interaction: 6 minutes.  F/U Phone call planned: none

## 2022-12-28 NOTE — Progress Notes (Signed)
Reviewed and agree with Dr Koval's plan.   

## 2023-01-07 ENCOUNTER — Ambulatory Visit: Payer: Medicare HMO | Admitting: Pharmacist

## 2023-01-07 ENCOUNTER — Encounter: Payer: Self-pay | Admitting: Pharmacist

## 2023-01-07 VITALS — BP 108/74 | HR 93 | Wt 235.4 lb

## 2023-01-07 DIAGNOSIS — Z7985 Long-term (current) use of injectable non-insulin antidiabetic drugs: Secondary | ICD-10-CM

## 2023-01-07 DIAGNOSIS — E118 Type 2 diabetes mellitus with unspecified complications: Secondary | ICD-10-CM

## 2023-01-07 NOTE — Progress Notes (Signed)
S:     Chief Complaint  Patient presents with   Medication Management    T2DM   67 y.o. male who presents for diabetes evaluation, education, and management. Patient arrives in fair spirits and presents without  any assistance. Visit is conducted with his daughter, Kirk Ruths, on the phone. Since last appointment has not started exercising nor has he attended a football game. Reports that his insurance will be contacting the office regarding his care here as he believes he may be due a refund from his insurance.  Reports that he is feeling the same since starting insulin, has no more energy than before.   Patient was last seen by Primary Care Provider, Dr. Marisue Humble, on 12/17/2022.   PMH is significant for T2DM, HTN, Diabetic Neuropathy, NSTEMI, CAD.  At last pharmacy visit on 12/24/2022, started insulin degludec Evaristo Bury) 24 units into the skin daily, increase by 2 units daily for a goal dose of 40 units daily.  (Pen devices preferred and prescribed), stopped insulin glargine (Lantus). Continued all other medications  Patient reports Diabetes was diagnosed in 2020.   Family/Social History: Grandmother, Father, Sisters all have T2DM  Current diabetes medications include: dapagliflozin Marcelline Deist) 10 mg once daily, dulaglutide (Trulicity) 1.5 mg once weekly (Tuesdays), insulin Tresiba (degludec) 40 units once daily,  Current hypertension medications include:  isosorbide mononitrate 30mg  daily, metoprolol 100 mg daily at bedtime.  Current hyperlipidemia medications include: atorvastatin 80 mg once daily, ezetimibe 10 mg once daily  Patient and daughter denies adherence with medications, reports missing insulins 1-2 times per week, on average.Reports adherence to PO medications.   Do you feel that your medications are working for you? "I don't believe it is" Have you been experiencing any side effects to the medications prescribed? No  Do you have any problems obtaining medications due to  transportation or finances? Yes - has not picked up his Lyrica (pregabalin) due to cost  Insurance coverage: [employer] & [place] [employer]  Patient denies hypoglycemic events.   Reported home fasting blood sugars: ~100s, 202    Patient reports nocturia (nighttime urination).  Patient reports neuropathy (nerve pain). Patient reports visual changes. "A touch better" Patient reports self foot exams.   Patient reported dietary habits: Eats 2 meals/day - "anything I want".   Patient-reported exercise habits: Hesitant to get active, planning to retry swimming once this month or using fitness center with [employer].   O:   Review of Systems  Neurological:  Positive for tingling (Neuropathy in both feet).  All other systems reviewed and are negative.   Physical Exam Vitals reviewed.  Constitutional:      Appearance: Normal appearance.  Neurological:     Mental Status: He is alert.  Psychiatric:        Thought Content: Thought content normal.        Judgment: Judgment normal.     Lab Results  Component Value Date   HGBA1C 12.4 (A) 12/17/2022   Vitals:   01/07/23 1045  BP: 108/74  Pulse: 93  SpO2: 98%    Lipid Panel     Component Value Date/Time   CHOL 119 07/20/2022 1111   TRIG 103 07/20/2022 1111   HDL 35 (L) 07/20/2022 1111   CHOLHDL 3.4 07/20/2022 1111   CHOLHDL 6.6 12/24/2018 0214   VLDL 25 12/24/2018 0214   LDLCALC 65 07/20/2022 1111   LDLDIRECT 58 02/21/2021 1007    Clinical Atherosclerotic Cardiovascular Disease (ASCVD): Yes  The ASCVD Risk score (  Arnett DK, et al., 2019) failed to calculate for the following reasons:   The patient has a prior MI or stroke diagnosis   Patient is participating in a Managed Medicaid Plan:  Yes   A/P: Diabetes longstanding ~ 4 years currently uncontrolled, but improving since the start of insulin with reported home blood sugars in ~100s-200s. Patient is able to verbalize appropriate hypoglycemia management  plan. Medication adherence appears okay, reports missing insulin 1-2x weekly. Control is suboptimal due to medication adherence issues.  -Contined dapagliflozin (Farxiga) 10 mg once daily, dulaglutide (Trulicity) 1.5 mg once weekly (Tuesdays), insulin Tresiba (degludec) 40 units once daily - encouraged adherence to insulin - goal for 7/7 days adherence -Encouraged using [employer] program and going swimming/getting active at least 1x before next visit -Patient educated on purpose, proper use, and potential adverse effects of.  -Extensively discussed pathophysiology of diabetes, recommended lifestyle interventions, dietary effects on blood sugar control.  -Counseled on s/sx of and management of hypoglycemia.  -Next A1c anticipated 02/01/2023.   ASCVD risk - secondary prevention in patient with diabetes. Last LDL is 65 at goal of <70 mg/dL.  -Continued atorvastatin 80 mg once daily, ezetimibe 10 mg once daily  Hypertension longstanding currently controlled. Blood pressure goal of <130/80 mmHg. Medication adherence good.  -Continued isosorbide mononitrate 30mg  daily, metoprolol succinate 100 mg daily at bedtime.  -Consider addition of ARB in future if necessary  Written patient instructions provided. Patient verbalized understanding of treatment plan.  Total time in face to face counseling 36 minutes.    Follow-up:  Pharmacist 02/01/2023 PCP clinic visit in PRN Patient seen with Shona Simpson, PharmD Candidate.

## 2023-01-07 NOTE — Assessment & Plan Note (Signed)
Diabetes longstanding ~ 4 years currently uncontrolled, but improving since the start of insulin with reported home blood sugars in ~100s-200s. Patient is able to verbalize appropriate hypoglycemia management plan. Medication adherence appears okay, reports missing insulin 1-2x weekly. Control is suboptimal due to medication adherence issues.  -Contined dapagliflozin (Farxiga) 10 mg once daily, dulaglutide (Trulicity) 1.5 mg once weekly (Tuesdays), insulin Tresiba (degludec) 40 units once daily - encouraged adherence to insulin - goal for 7/7 days adherence -Encouraged using [employer] program and going swimming/getting active at least 1x before next visit -Patient educated on purpose, proper use, and potential adverse effects of.  -Extensively discussed pathophysiology of diabetes, recommended lifestyle interventions, dietary effects on blood sugar control.

## 2023-01-07 NOTE — Patient Instructions (Signed)
It was nice to see you today!  Your goal blood sugar is 80-130 before eating and less than 180 after eating.  Waking up in the middle of the night can be common.   Medication Changes: CONTINUE 40 units of insulin Tresiba (degludec) once daily - TRY TO TAKE YOUR INSULIN EVERY DAY!  Continue all other medication the same.   Monitor blood sugars at home and keep a log (glucometer or piece of paper) to bring with you to your next visit.  Keep up the good work with diet and exercise. Aim for a diet full of vegetables, fruit and lean meats (chicken, [place], fish). Try to limit salt intake by eating fresh or frozen vegetables (instead of canned), rinse canned vegetables prior to cooking and do not add any additional salt to meals.

## 2023-01-08 ENCOUNTER — Ambulatory Visit: Payer: Medicare HMO | Admitting: Cardiovascular Disease

## 2023-01-08 NOTE — Progress Notes (Signed)
Reviewed and agree with Dr Macky Lower plan.

## 2023-01-11 ENCOUNTER — Telehealth: Payer: Self-pay | Admitting: Cardiovascular Disease

## 2023-01-11 NOTE — Telephone Encounter (Signed)
Spoke w/pt's daughter in regards to [facility] paperwork needing completion (ROI & Billing forms) + $29 processing fee (cash, check, or money order). Informed pt's daughter that Dr. Tresa Endo is out of the office until next week & will complete at his convenience. States pt will come into office to complete forms.  JB, 01-11-23

## 2023-02-01 ENCOUNTER — Ambulatory Visit: Payer: Medicare HMO | Admitting: Pharmacist

## 2023-02-03 ENCOUNTER — Encounter: Payer: Self-pay | Admitting: Pharmacist

## 2023-02-03 ENCOUNTER — Ambulatory Visit: Payer: Medicare HMO | Admitting: Pharmacist

## 2023-02-03 VITALS — BP 137/78 | HR 95

## 2023-02-03 DIAGNOSIS — Z794 Long term (current) use of insulin: Secondary | ICD-10-CM | POA: Diagnosis not present

## 2023-02-03 DIAGNOSIS — E118 Type 2 diabetes mellitus with unspecified complications: Secondary | ICD-10-CM | POA: Diagnosis not present

## 2023-02-03 LAB — POCT GLYCOSYLATED HEMOGLOBIN (HGB A1C): HbA1c, POC (controlled diabetic range): 10.5 % — AB (ref 0.0–7.0)

## 2023-02-03 MED ORDER — TRULICITY 3 MG/0.5ML ~~LOC~~ SOAJ: 3.0000 mg | SUBCUTANEOUS | 2 refills | Status: DC

## 2023-02-03 MED ORDER — TRESIBA FLEXTOUCH 100 UNIT/ML ~~LOC~~ SOPN: 50.0000 [IU] | PEN_INJECTOR | Freq: Every day | SUBCUTANEOUS | 1 refills | Status: DC

## 2023-02-03 NOTE — Assessment & Plan Note (Signed)
Diabetes longstanding currently slightly improved control with use of insulin, SGLT2 and GLP therapy. Patient is able to verbalize appropriate hypoglycemia management plan. Medication adherence appears much improved with help of daughter. Patient was happy to note improved A1c.  We discussed that a lower goal A1C is our short-term plan.  -Adjusted dose of basal insulin Tresiba (insulin degludec) from 40 to 50 units daily in the morning.  -Increased dose of GLP-1 Trulicity (dulaglutide) from 1.5 to 3mg  weekly - new prescription provided -Continued SGLT2-I Farxiga (dapagliflozin) 10mg  daily -Patient educated on purpose, proper use, and potential adverse effects.  -Extensively discussed pathophysiology of diabetes, recommended lifestyle interventions, dietary effects on blood sugar control.

## 2023-02-03 NOTE — Progress Notes (Signed)
    S:     Chief Complaint  Patient presents with   Medication Management    Diabetes   67 y.o. male who presents for diabetes evaluation, education, and management. Patient arrives in good spirits and presents without any assistance. Patient visit conducted with daughter joining visit via phone.   Patient was referred and last seen by Primary Care Provider, Dr. Marisue Humble , on 12/17/2022.   PMH is significant for Diabetes, Hypertension, and pains.  Since last visit, patient reports improved adherence with pill box and taking injection therapy.  He has not been testing fingerstick glucose.   Current diabetes medications include: dapagliflozin (Farxiga) 10 mg once daily, dulaglutide (Trulicity) 1.5 mg once weekly (Tuesdays), insulin Tresiba (degludec) 40 units once daily,   Patient reports adherence to taking all medications as prescribed.   Patient denies hypoglycemic events.  Patient reports neuropathy (nerve pain) and significant pain in right foot.   Patient-reported exercise habits: limited - he did express interest in football including attending [employer] football game recently.   O:   Review of Systems  Musculoskeletal:  Positive for joint pain (right foot stiffness) and neck pain.  All other systems reviewed and are negative.  Physical Exam Constitutional:      Appearance: Normal appearance.  Pulmonary:     Effort: Pulmonary effort is normal.  Neurological:     Mental Status: He is alert.  Psychiatric:        Mood and Affect: Mood normal.        Behavior: Behavior normal.        Thought Content: Thought content normal.   Previous A1c was 12.4  Lab Results  Component Value Date   HGBA1C 10.5 (A) 02/03/2023   Vitals:   02/03/23 1114  BP: 137/78  Pulse: 95  SpO2: 95%    Lipid Panel     Component Value Date/Time   CHOL 119 07/20/2022 1111   TRIG 103 07/20/2022 1111   HDL 35 (L) 07/20/2022 1111   CHOLHDL 3.4 07/20/2022 1111   CHOLHDL 6.6 12/24/2018 0214    VLDL 25 12/24/2018 0214   LDLCALC 65 07/20/2022 1111   LDLDIRECT 58 02/21/2021 1007    A/P: Diabetes longstanding currently slightly improved control with use of insulin, SGLT2 and GLP therapy. Patient is able to verbalize appropriate hypoglycemia management plan. Medication adherence appears much improved with help of daughter. Patient was happy to note improved A1c.  We discussed that a lower goal A1C is our short-term plan.  -Adjusted dose of basal insulin Tresiba (insulin degludec) from 40 to 50 units daily in the morning.  -Increased dose of GLP-1 Trulicity (dulaglutide) from 1.5 to 3mg  weekly - new prescription provided -Continued SGLT2-I Farxiga (dapagliflozin) 10mg  daily -Patient educated on purpose, proper use, and potential adverse effects.  -Extensively discussed pathophysiology of diabetes, recommended lifestyle interventions, dietary effects on blood sugar control.  -Counseled on s/sx of and management of hypoglycemia.  Asked patient to monitor a few days per week with goals of seeing values 100-low 200s in the next several weeks.  -Next A1c anticipated 3 months.   Called his pharmacy and asked them to pull / prepare for pick-up his Lyrica (pregabalin) 75mg  TID.  Trulicity expected Friday.    Written patient instructions provided. Patient verbalized understanding of treatment plan.  Total time in face to face counseling 31 minutes.    Follow-up:  Pharmacist 02/22/2023 PCP clinic visit in early 2025 Patient seen with Dr. Royal Piedra

## 2023-02-03 NOTE — Patient Instructions (Addendum)
It was nice to see you today!  Your A1C is improved but we still have work to do to get closer to goal.  I would like to see more readings less than 200.   Please check a few readings per week.   Medication Changes: Increase Trulicity to 3mg  once weekly - pick up new supply  Increase Tresiba to 50 units once daily   Restart Lyrica (pregabalin)  75 mg Three times daily  Continue all other medication the same.   Monitor blood sugars at home and keep a log (glucometer or piece of paper) to bring with you to your next visit.  Keep up the good work with diet and exercise. Aim for a diet full of vegetables, fruit and lean meats (chicken, [place], fish). Try to limit salt intake by eating fresh or frozen vegetables (instead of canned), rinse canned vegetables prior to cooking and do not add any additional salt to meals.

## 2023-02-04 NOTE — Progress Notes (Signed)
Reviewed and agree with Dr Koval's plan.   

## 2023-02-22 ENCOUNTER — Ambulatory Visit: Payer: Medicare HMO | Admitting: Pharmacist

## 2023-03-02 ENCOUNTER — Ambulatory Visit: Payer: Medicare HMO | Admitting: Pharmacist

## 2023-03-18 ENCOUNTER — Other Ambulatory Visit (INDEPENDENT_AMBULATORY_CARE_PROVIDER_SITE_OTHER): Payer: Medicare HMO | Admitting: Pharmacist

## 2023-03-18 DIAGNOSIS — E1142 Type 2 diabetes mellitus with diabetic polyneuropathy: Secondary | ICD-10-CM

## 2023-03-18 MED ORDER — PREGABALIN 75 MG PO CAPS: ORAL_CAPSULE | ORAL | 1 refills | Status: DC

## 2023-03-18 NOTE — Patient Instructions (Signed)
Visit in 1 week  2/6 at 10:30 AM with [facility]

## 2023-03-18 NOTE — Progress Notes (Signed)
Reviewed and agree with Dr Macky Lower plan.

## 2023-03-18 NOTE — Addendum Note (Signed)
Addended by: Nelia Shi on: 03/18/2023 08:57 PM   Modules accepted: Orders

## 2023-03-18 NOTE — Progress Notes (Signed)
Reviewed PDMP, last refill of pregabalin 12/18 for 90 tablets, takes TID PRN.  Appropriate refill, sending prescription.

## 2023-03-18 NOTE — Progress Notes (Signed)
Phone call from daughter attempting to reschedule appointment.   I was able to schedule in 1 week with patient.   Since last contact patient's daughter reports he is doing well overall.   She requested refill of Lyrica (pregabalin) which has been helpful for patient.  I shared with her that I would forward request to PCP.     Total time with patient call and documentation of interaction: 8 minutes.

## 2023-03-25 ENCOUNTER — Ambulatory Visit: Payer: Medicare HMO | Admitting: Pharmacist

## 2023-03-29 ENCOUNTER — Telehealth: Payer: Self-pay | Admitting: Pharmacist

## 2023-03-29 NOTE — Telephone Encounter (Signed)
 [name] contacted for follow-up of missed appointment.    Agreed to reschedule appointment - 04/02/2023    Total time with patient call and documentation of interaction:  4 minutes.

## 2023-04-02 ENCOUNTER — Encounter: Payer: Self-pay | Admitting: Pharmacist

## 2023-04-02 ENCOUNTER — Ambulatory Visit (INDEPENDENT_AMBULATORY_CARE_PROVIDER_SITE_OTHER): Payer: Medicare HMO | Admitting: Pharmacist

## 2023-04-02 VITALS — BP 123/80 | HR 88 | Wt 238.8 lb

## 2023-04-02 DIAGNOSIS — Z23 Encounter for immunization: Secondary | ICD-10-CM

## 2023-04-02 DIAGNOSIS — I25110 Atherosclerotic heart disease of native coronary artery with unstable angina pectoris: Secondary | ICD-10-CM | POA: Diagnosis not present

## 2023-04-02 DIAGNOSIS — E118 Type 2 diabetes mellitus with unspecified complications: Secondary | ICD-10-CM | POA: Diagnosis not present

## 2023-04-02 LAB — POCT GLYCOSYLATED HEMOGLOBIN (HGB A1C): HbA1c, POC (controlled diabetic range): 13.2 % — AB (ref 0.0–7.0)

## 2023-04-02 MED ORDER — METOPROLOL SUCCINATE ER 100 MG PO TB24: 100.0000 mg | ORAL_TABLET | Freq: Every day | ORAL | 3 refills | Status: DC

## 2023-04-02 MED ORDER — TRESIBA FLEXTOUCH 100 UNIT/ML ~~LOC~~ SOPN
50.0000 [IU] | PEN_INJECTOR | Freq: Every day | SUBCUTANEOUS | 1 refills | Status: DC
Start: 2023-04-02 — End: 2023-07-26

## 2023-04-02 MED ORDER — TRULICITY 3 MG/0.5ML ~~LOC~~ SOAJ: 3.0000 mg | SUBCUTANEOUS | 2 refills | Status: DC

## 2023-04-02 NOTE — Assessment & Plan Note (Signed)
Diabetes longstanding currently uncontrolled due to medication nonadherence, dietary indiscretion and sedentary lifestyle. Patient is able to verbalize appropriate hypoglycemia management plan.  Control is suboptimal due to multiple factors including poor medication adherence, A1c 13.2 today. -Restarted basal insulin Lantu (insulin glargine) 50 units daily -Restarted GLP-1 Trulicity (dulaglutide) 3 mg weekly -Continued SGLT2-I Farxiga (dapagliflozin) 10 mg. Currently tolerating. -Encouraged to return to swimming (personal goal) - daughter set up [employer] and [employer] memberships -Goal 1-2x week at gym  -Patient educated on purpose, proper use, and potential adverse effects.  -Extensively discussed pathophysiology of diabetes, recommended lifestyle interventions, dietary effects on blood sugar control.  -Counseled on s/sx of and management of hypoglycemia.  -Next A1c anticipated May 2025.

## 2023-04-02 NOTE — Patient Instructions (Signed)
It was nice to see you today!  Your goal blood sugar is 80-130 before eating and less than 180 after eating.  Medication Changes: Restart insulin degludec Evaristo Bury) 50 units daily Restart dulaglutide (Trulicity) 3 mg weekly  Continue all other medication the same.   Monitor blood sugars at home and keep a log (glucometer or piece of paper) to bring with you to your next visit.  Keep up the good work with diet and exercise. Aim for a diet full of vegetables, fruit and lean meats (chicken, [place], fish). Try to limit salt intake by eating fresh or frozen vegetables (instead of canned), rinse canned vegetables prior to cooking and do not add any additional salt to meals.

## 2023-04-02 NOTE — Progress Notes (Signed)
S:     Chief Complaint  Patient presents with   Medication Management    Diabetes Follow-up   68 y.o. male who presents for diabetes evaluation, education, and management. Patient arrives in good spirits and presents without any assistance. Daughter on phone during visit.   Patient with long history of poor adherence and missing appointments. Adherence has since approved with utilization of pill box, but still room for improvement. Reports not measuring blood sugar at home.   Patient was referred and last seen by Primary Care Provider, Dr. Marisue Humble, on 12/17/23.   PMH is significant for DM, HTN, and pains .  At last visit, declined CGM offer and encouraged to restart insulin glargline (Lantus) 40 units and trulicity 1.5 mg weekly.   Current diabetes medications include: dapagliflozin (Farxiga) 10 mg, admits non-adherence to insulin and Trulicity (dulaglutide). Current hypertension medications include: metoprolol succinate (Toprol-XL) 100 mg daily,  Current hyperlipidemia medications include: atorvastatin (lipitor) 80 mg daily, ezetimibe (Zetia) 10 mg daily  [name] sets up pill-box for him weekly. Patient denies adherence with medications, reports missing all medications 1 times per week, on average. Reports not taking dulaglutide (Trulicity) - does not recall when last dose was. Denies use of insulin degludec (tresiba) in "about a month".   Do you feel that your medications are working for you? yes Have you been experiencing any side effects to the medications prescribed? no Do you have any problems obtaining medications due to transportation or finances? no Insurance coverage: [facility] HMO  Patient denies hypoglycemic events.   O:   Review of Systems  All other systems reviewed and are negative.   Physical Exam Constitutional:      Appearance: Normal appearance.  HENT:     Nose: Congestion (runny nose) present.  Pulmonary:     Effort: Pulmonary  effort is normal.  Neurological:     Mental Status: He is alert. Mental status is at baseline.     Lab Results  Component Value Date   HGBA1C 13.2 (A) 04/02/2023   Vitals:   04/02/23 1101  BP: 123/80  Pulse: 88  SpO2: 98%    Lipid Panel     Component Value Date/Time   CHOL 119 07/20/2022 1111   TRIG 103 07/20/2022 1111   HDL 35 (L) 07/20/2022 1111   CHOLHDL 3.4 07/20/2022 1111   CHOLHDL 6.6 12/24/2018 0214   VLDL 25 12/24/2018 0214   LDLCALC 65 07/20/2022 1111   LDLDIRECT 58 02/21/2021 1007    Clinical Atherosclerotic Cardiovascular Disease (ASCVD): Yes  The ASCVD Risk score (Arnett DK, et al., 2019) failed to calculate for the following reasons:   Risk score cannot be calculated because patient has a medical history suggesting prior/existing ASCVD    A/P: Diabetes longstanding currently uncontrolled due to medication nonadherence, dietary indiscretion and sedentary lifestyle. Patient is able to verbalize appropriate hypoglycemia management plan.  Control is suboptimal due to multiple factors including poor medication adherence, A1c 13.2 today. -Restarted basal insulin Lantu (insulin glargine) 50 units daily -Restarted GLP-1 Trulicity (dulaglutide) 3 mg weekly -Continued SGLT2-I Farxiga (dapagliflozin) 10 mg. Currently tolerating. -Encouraged to return to swimming (personal goal) - daughter set up [employer] and [employer] memberships -Goal 1-2x week at gym  -Patient educated on purpose, proper use, and potential adverse effects.  -Extensively discussed pathophysiology of diabetes, recommended lifestyle interventions, dietary effects on blood sugar control.  -Counseled on s/sx of and management of hypoglycemia.  -Next A1c anticipated May 2025.  ASCVD risk - secondary prevention in patient with diabetes. Last LDL is at goal of <70  mg/dL. ASCVD risk factors include DM. -Continued atorvastatin 80 mg and ezetimibe (Zetia) 10 mg.  Hypertension longstanding  currently controlled. Blood pressure goal of <130/80 mmHg. Medication adherence good.  -Continued metoprolol succinate (Toprol-XL) 100 mg.  Agreed to receive Prevnar - pneumonia vaccination - today.   Written patient instructions provided. Patient verbalized understanding of treatment plan.  Total time in face to face counseling 33 minutes.    Follow-up:  Pharmacist 04/26/2023 PCP clinic visit in TBD Patient seen with Lavona Mound, PharmD Candidate and Pearletha Forge, PharmD Candidate.

## 2023-04-02 NOTE — Progress Notes (Signed)
Reviewed and agree with Dr Macky Lower plan.

## 2023-04-21 ENCOUNTER — Telehealth: Payer: Self-pay | Admitting: Cardiovascular Disease

## 2023-04-21 NOTE — Telephone Encounter (Signed)
 Spoke w/pt's daughter in regards to Dis paperwork received [employer]. ROI & Billing form needing completion + $29 fee (cash, check, or money order). Aware that Dr. Tresa Endo will return to office next week & will complete at convenience. Advised pt to call back w/questions & specify my name for msgs to be routed to me, if need be.  Paperwork left in Dr. Landry Dyke mailbox.  JB, 04-21-23

## 2023-04-26 ENCOUNTER — Ambulatory Visit (INDEPENDENT_AMBULATORY_CARE_PROVIDER_SITE_OTHER): Payer: Medicare HMO | Admitting: Pharmacist

## 2023-04-26 ENCOUNTER — Ambulatory Visit: Payer: Medicare HMO | Admitting: Pharmacist

## 2023-04-26 ENCOUNTER — Encounter: Payer: Self-pay | Admitting: Pharmacist

## 2023-04-26 VITALS — BP 133/87 | Temp 97.9°F | Wt 238.2 lb

## 2023-04-26 DIAGNOSIS — E118 Type 2 diabetes mellitus with unspecified complications: Secondary | ICD-10-CM | POA: Diagnosis not present

## 2023-04-26 NOTE — Progress Notes (Signed)
 S:     Chief Complaint  Patient presents with   Medication Management    Diabetes follow up - Illness   68 y.o. male who presents for diabetes evaluation, education, and management. Patient arrives in fair spirits and presents without any assistance, however reports feeling dizzy with recent illness. Patient is accompanied by his daughter, Keith Newton.   Patient was referred and last seen by Primary Care Provider, Dr. Marisue Humble, on 12/17/2022.   PMH is significant for Diabetes, Hypertension and Hyperlipidemia.  At last visit, we discussed medication adherence to all pills and need to be more consistent with Tresiba (insulin degludec) and Trulicity (dulaglutide) use.  History: Illness for 5 days reported by patient and daughter   Current diabetes medications include: Tresiba (insulin degludec) 50 units daily, Farxiga (dapagliflozin) 10 mg daily and Trulicity (dulaglutide) 3 mg weekly Current hypertension medications include: Metoprolol 100 mg daily Current hyperlipidemia medications include: Atorvastatin 80 mg daily and ezetimibe 10 mg daily   Patient denies hypoglycemic events.  Reported home fasting blood sugars: Minimal checking - minimal nocturia while using injectable medications.    Patient reports minimal nocturia (nighttime urination).   Patient reported dietary habits: Eating less while sick.  Encouraged to continue fluid intake to remain hydrated.    O:   Review of Systems  Constitutional:  Positive for fever and malaise/fatigue.  Respiratory:  Positive for cough.   Neurological:  Positive for dizziness.    Physical Exam Constitutional:      Appearance: Normal appearance.  Pulmonary:     Effort: Pulmonary effort is normal.  Neurological:     Mental Status: He is alert.  Psychiatric:        Mood and Affect: Mood normal.        Thought Content: Thought content normal.      Lab Results  Component Value Date   HGBA1C 13.2 (A) 04/02/2023   Vitals:    04/26/23 0950  BP: 133/87  Temp: 97.9 F (36.6 C)  SpO2: 100%    Lipid Panel     Component Value Date/Time   CHOL 119 07/20/2022 1111   TRIG 103 07/20/2022 1111   HDL 35 (L) 07/20/2022 1111   CHOLHDL 3.4 07/20/2022 1111   CHOLHDL 6.6 12/24/2018 0214   VLDL 25 12/24/2018 0214   LDLCALC 65 07/20/2022 1111   LDLDIRECT 58 02/21/2021 1007    Clinical Atherosclerotic Cardiovascular Disease (ASCVD): Yes  The ASCVD Risk score (Arnett DK, et al., 2019) failed to calculate for the following reasons:   The valid total cholesterol range is 130 to 320 mg/dL   Patient is participating in a Managed Medicaid Plan:  No  A/P: Diabetes longstanding currently uncontrolled. Patient is  able to verbalize appropriate hypoglycemia management plan. Medication adherence appears adherent.  -Continued basal insulin Tresiba (insulin degludec) 50 units daily  -Continued GLP-1 Trulicity (dulaglutide) 3 mg weekly. Daughter expressed concerns about copay, said she would look into manufacturer assistance programs. I counseled her on use of the M3P program to spread out costs throughout the year.  -Continued SGLT2-I  Farxiga (dapagliflozin) 10 mg daily. Counseled on sick day rules. -Extensively discussed pathophysiology of diabetes, recommended lifestyle interventions, dietary effects on blood sugar control.  -Counseled on s/sx of and management of hypoglycemia.  -Next A1c anticipated after illness resolves (due next blood draw).   ASCVD risk - Secondary prevention in patient with diabetes. Last LDL is 65 not at goal of <55 mg/dL. ASCVD risk factors include HTN, HLD, T2DM.  High intensity statin indicated.  -Continued atorvastatin 80 mg daily. -Continued ezetimibe 10 mg daily   Hypertension- Blood pressure goal of <130/80 mmHg. Medication adherence reports adherence.  -Continued metoprolol XL 100 mg daily.  Recent respiratory illness for 5 days.  Appears stable, no fever respiratory distress.    Written  patient instructions provided. Patient verbalized understanding of treatment plan.  Total time in face to face counseling 26 minutes.    Follow-up:   PCP clinic visit in 05/06/2023 Patient seen with Threasa Heads, PharmD Candidate and Mack Guise, PharmD Candidate.

## 2023-04-26 NOTE — Assessment & Plan Note (Signed)
 Diabetes longstanding currently uncontrolled. Patient is  able to verbalize appropriate hypoglycemia management plan. Medication adherence appears adherent.  -Continued basal insulin Tresiba (insulin degludec) 50 units daily  -Continued GLP-1 Trulicity (dulaglutide) 3 mg weekly. Daughter expressed concerns about copay, said she would look into manufacturer assistance programs. I counseled her on use of the M3P program to spread out costs throughout the year.  -Continued SGLT2-I  Farxiga (dapagliflozin) 10 mg daily. Counseled on sick day rules. -Extensively discussed pathophysiology of diabetes, recommended lifestyle interventions, dietary effects on blood sugar control.  -Counseled on s/sx of and management of hypoglycemia.

## 2023-04-26 NOTE — Patient Instructions (Signed)
 It was nice to see you today!  Your goal blood sugar is 80-130 before eating and less than 180 after eating.  Medication Changes:   Continue all other medication the same.   Monitor blood sugars at home and keep a log (glucometer or piece of paper) to bring with you to your next visit.  Keep up the good work with diet and exercise. Aim for a diet full of vegetables, fruit and lean meats (chicken, [place], fish). Try to limit salt intake by eating fresh or frozen vegetables (instead of canned), rinse canned vegetables prior to cooking and do not add any additional salt to meals.

## 2023-04-26 NOTE — Progress Notes (Signed)
 Reviewed and agree with Dr Macky Lower plan.

## 2023-05-05 ENCOUNTER — Telehealth: Payer: Self-pay

## 2023-05-05 NOTE — Telephone Encounter (Signed)
 Patient FMLA  forms given to Dr. Tresa Endo for completion. Copy also made to be filed in case of loss

## 2023-05-06 ENCOUNTER — Ambulatory Visit: Admitting: Family Medicine

## 2023-05-10 ENCOUNTER — Encounter: Payer: Self-pay | Admitting: Family Medicine

## 2023-05-10 DIAGNOSIS — E113292 Type 2 diabetes mellitus with mild nonproliferative diabetic retinopathy without macular edema, left eye: Secondary | ICD-10-CM | POA: Diagnosis not present

## 2023-05-10 LAB — HM DIABETES EYE EXAM

## 2023-05-11 ENCOUNTER — Telehealth: Payer: Self-pay | Admitting: Orthopedic Surgery

## 2023-05-11 DIAGNOSIS — Z0279 Encounter for issue of other medical certificate: Secondary | ICD-10-CM

## 2023-05-11 NOTE — Telephone Encounter (Signed)
 Pt came in and states that his employer health insurance was still taking payments of health insurance after retirement date,. Pt states they may contact Dr August Saucer thru call or fax about pt's surgery back ion 05/2020. Please look out for this and contact persons. Pt phone number is [phone].

## 2023-05-12 NOTE — Telephone Encounter (Signed)
 Pt came into office, 05-11-23, to complete ROI & Billing forms + pay $29 fee (cash). Pt states insurance company attempted to contact Dr. Tresa Endo previously, but was unable to get through.  Pt states paperwork is needed due to retirement in 2020, but $ was still being deducted.   Another copy of paperwork to be completed by Dr. Tresa Endo given to Olevia Bowens, 05-12-23

## 2023-05-13 ENCOUNTER — Telehealth: Payer: Self-pay

## 2023-05-13 NOTE — Telephone Encounter (Signed)
 Left vm for pt ([phone]) regarding FMLA paperwork. Pt needs appt w/Dr. Tresa Endo prior to completion of paperwork, as pt has not been seen since 2023.  Pt may specify my name if returned call. Please route all FMLA-related info to me.  Thanks, JB 05-13-23

## 2023-05-13 NOTE — Telephone Encounter (Signed)
 Received VM from [employer] regarding FMLA paperwork completion.   Per chart review, this paperwork was being handled by cardiology office.   Returned call to Janice Coffin and advised that they reach out to Cardiology office regarding paperwork.   Veronda Prude, RN

## 2023-05-18 ENCOUNTER — Encounter: Payer: Self-pay | Admitting: Cardiovascular Disease

## 2023-05-18 ENCOUNTER — Ambulatory Visit: Attending: Cardiovascular Disease | Admitting: Cardiovascular Disease

## 2023-05-18 VITALS — BP 92/76 | HR 101 | Ht 68.0 in | Wt 248.0 lb

## 2023-05-18 DIAGNOSIS — I10 Essential (primary) hypertension: Secondary | ICD-10-CM

## 2023-05-18 DIAGNOSIS — I25119 Atherosclerotic heart disease of native coronary artery with unspecified angina pectoris: Secondary | ICD-10-CM

## 2023-05-18 NOTE — Patient Instructions (Signed)
 Medication Instructions:  No labs were ordered during today's visit.  *If you need a refill on your cardiac medications before your next appointment, please call your pharmacy*   Lab Work: No labs were ordered during today's visit.  If you have labs (blood work) drawn today and your tests are completely normal, you will receive your results only by: MyChart Message (if you have MyChart) OR A paper copy in the mail If you have any lab test that is abnormal or we need to change your treatment, we will call you to review the results.   Testing/Procedures: No procedures were ordered during today's visit.    Follow-Up: At [facility], you and your health needs are our priority.  As part of our continuing mission to provide you with exceptional heart care, we have created designated Provider Care Teams.  These Care Teams include your primary Cardiologist (physician) and Advanced Practice Providers (APPs -  Physician Assistants and Nurse Practitioners) who all work together to provide you with the care you need, when you need it.  We recommend signing up for the patient portal called "MyChart".  Sign up information is provided on this After Visit Summary.  MyChart is used to connect with patients for Virtual Visits (Telemedicine).  Patients are able to view lab/test results, encounter notes, upcoming appointments, etc.  Non-urgent messages can be sent to your provider as well.   To learn more about what you can do with MyChart, go to [email or web address].    Your next appointment:   1 year(s)  Provider:   Dr. Herbie Baltimore     Other Instructions [facility]  [address] [place], [place] [address] [address] [phone]       1st Floor: - Lobby - Registration  - Pharmacy  - Lab - Cafe   2nd Floor: - PV Lab - Diagnostic Testing (echo, CT, nuclear med)   3rd Floor: - Vacant   4th Floor: - TCTS (cardiothoracic surgery) - AFib Clinic -  Structural Heart Clinic - Vascular Surgery  - Vascular Ultrasound   5th Floor: - [facility] (general and EP) - Clinical Pharmacy for coumadin, hypertension, lipid, weight-loss medications, and med management appointments      Valet parking services will be available as well.

## 2023-05-18 NOTE — Telephone Encounter (Signed)
 Completed FMLA paperwork by Dr. Tresa Endo faxed to [facility] & Billing form faxed. Copy of paperwork given to pt during OV. All forms scanned into pt's chart.  JB, 05-18-23

## 2023-05-19 NOTE — Telephone Encounter (Signed)
 I have not seen this come in

## 2023-05-20 ENCOUNTER — Other Ambulatory Visit (HOSPITAL_COMMUNITY): Payer: Self-pay

## 2023-05-20 ENCOUNTER — Ambulatory Visit: Admitting: Family Medicine

## 2023-05-20 VITALS — BP 108/74 | HR 92 | Ht 68.0 in | Wt 243.4 lb

## 2023-05-20 DIAGNOSIS — Z1211 Encounter for screening for malignant neoplasm of colon: Secondary | ICD-10-CM

## 2023-05-20 DIAGNOSIS — E118 Type 2 diabetes mellitus with unspecified complications: Secondary | ICD-10-CM | POA: Diagnosis not present

## 2023-05-20 DIAGNOSIS — J302 Other seasonal allergic rhinitis: Secondary | ICD-10-CM

## 2023-05-20 MED ORDER — TRULICITY 0.75 MG/0.5ML ~~LOC~~ SOAJ: 0.7500 mg | SUBCUTANEOUS | 2 refills | Status: DC

## 2023-05-20 MED ORDER — FLUTICASONE PROPIONATE 50 MCG/ACT NA SUSP: 1.0000 | Freq: Every day | NASAL | 2 refills | Status: DC

## 2023-05-20 MED ORDER — GLUCOSE BLOOD VI STRP: ORAL_STRIP | 12 refills | Status: DC

## 2023-05-20 MED ORDER — TRULICITY 1.5 MG/0.5ML ~~LOC~~ SOAJ: 1.5000 mg | SUBCUTANEOUS | 2 refills | Status: DC

## 2023-05-20 MED ORDER — CETIRIZINE HCL 10 MG PO TABS: 10.0000 mg | ORAL_TABLET | Freq: Every day | ORAL | 11 refills | Status: DC

## 2023-05-20 MED ORDER — TRULICITY 3 MG/0.5ML ~~LOC~~ SOAJ: 3.0000 mg | SUBCUTANEOUS | 2 refills | Status: DC

## 2023-05-20 MED ORDER — ACCU-CHEK GUIDE W/DEVICE KIT: PACK | 0 refills | Status: AC

## 2023-05-20 NOTE — Assessment & Plan Note (Signed)
 A1c poorly controlled.  Trulicity no longer affordable on insurance plan.  Contacted [employer] Spire to discuss financial assistance options for Trulicity, it appears to be $400 monthly minimum, 2 options provided; contact Medicare for monthly payment plan versus contacting Social Security office for co-pay assistance card.  Called patient's daughter to inform her of these options and she will pursue both options. -Clear goal to stop drinking sweet tea, sodas, juices given the high sugar content -Diabetic foot exam unremarkable -Continue the dapagliflozin 10 mg daily, Tresiba 50 units daily -Restart Trulicity at 0.75 mg weekly dose given prolonged interruption since last dose (initially erroneously sent 3 mg dose, called pharmacy and cancel prior prescription) -If patient is unable to receive Trulicity, will need titration of insulin -Continue atorvastatin 80 mg daily for ASCVD risk reduction -Follow-up in 1 month and repeat A1c

## 2023-05-20 NOTE — Progress Notes (Signed)
 SUBJECTIVE:   CHIEF COMPLAINT / HPI: Keith Newton is a 68 y.o. male with a pertinent past medical history of poorly controlled T2DM on insulin with neuropathy, HTN, HLD, CAD, and Hx NSTEMI presenting to the clinic for follow-up of chronic conditions including diabetes.  Patient called his daughter and she was present via phone line throughout the visit, assisting with history.  T2DM Was seen by Dr. Raymondo Band on 3/10 with no adjustments made to medications at the time. Last A1c 13.2 on 04/02/23 Home CBGs: Not checking Medications: Dapagliflozin 10 mg daily, Trulicity 3 mg weekly (insurance no longer covering), Tresiba 50u daily Hypoglycemia plan: Agrees Adherence: Good Eye exam: UTD, reportedly hyperglycemia causing difficulty seeing Foot exam: Due Microalbumin: UTD, WNL in November Statin: Atorvastatin 80 mg daily, ezetimibe 10 mg daily No symptoms of hypoglycemia, polyuria, polydipsia Wakes up 3 times nightly Denies numbness extremities, foot ulcers/trauma  Allergies Nose running, sneezing, cough, itchy eyes. This has been going on since spring started in season change. Patient feels the need to shower every time he comes from the outdoors to stop his symptoms. Pollen seems to irritate his sinuses horribly.   PERTINENT PMH / PSH: Poorly controlled T2DM on insulin with neuropathy, HTN, HLD, CAD, and Hx NSTEMI Family history of colon cancer Patient once again confirms that he does not need disability paperwork completed that keeps coming to my inbox every few weeks.   OBJECTIVE:   BP 108/74   Pulse 92   Ht 5\' 8"  (1.727 m)   Wt 243 lb 6.4 oz (110.4 kg)   SpO2 98%   BMI 37.01 kg/m   General: Age-appropriate, resting comfortably in chair, NAD, alert and at baseline. HEENT:  Head: Normocephalic, atraumatic. No tenderness to percussion over sinuses. Eyes: PERRLA.  Mild conjunctival erythema, no scleral injections. Nose: Erythematous turbinates with mild  rhinorrhea. Mouth/Oral: Posterior oropharyngeal cobblestoning consistent with postnasal drip.  MMM. Cardiovascular: Regular rate and rhythm. Normal S1/S2. No murmurs, rubs, or gallops appreciated. 2+ radial pulses. Pulmonary: Clear bilaterally to ascultation. No wheezes, crackles, or rhonchi. Normal WOB on room air. Extremities: No peripheral edema bilaterally. Capillary refill <2 seconds. Diabetic foot exam with monofilament shows intact sensation and discrimination over all toes and plantar surfaces of feet bilaterally.  No ulcers or calluses present.  Diabetic Foot Exam - Simple   Simple Foot Form Diabetic Foot exam was performed with the following findings: Yes 05/20/2023 10:27 AM  Visual Inspection See comments: Yes Sensation Testing See comments: Yes Pulse Check Posterior Tibialis and Dorsalis pulse intact bilaterally: Yes Comments Small callus on L submetatarsal, no ulcers. Intact sensation globally. Not intact to monofilament throughout.      ASSESSMENT/PLAN:   Assessment & Plan Type 2 diabetes mellitus with complication (HCC) A1c poorly controlled.  Trulicity no longer affordable on insurance plan.  Contacted [employer] Spire to discuss financial assistance options for Trulicity, it appears to be $400 monthly minimum, 2 options provided; contact Medicare for monthly payment plan versus contacting Social Security office for co-pay assistance card.  Called patient's daughter to inform her of these options and she will pursue both options. -Clear goal to stop drinking sweet tea, sodas, juices given the high sugar content -Diabetic foot exam unremarkable -Continue the dapagliflozin 10 mg daily, Tresiba 50 units daily -Restart Trulicity at 0.75 mg weekly dose given prolonged interruption since last dose (initially erroneously sent 3 mg dose, called pharmacy and cancel prior prescription) -If patient is unable to receive Trulicity, will need titration of insulin -Continue  atorvastatin  80 mg daily for ASCVD risk reduction -Follow-up in 1 month and repeat A1c Seasonal allergies Symptoms and physical exam consistent with seasonal allergies. -Cetirizine 10 mg daily -Fluticasone nasal spray daily Colon cancer screening Never done previously, canceled in 2020 due to NSTEMI. -Ambulatory referral to GI for colonoscopy  Return in about 1 month (around 06/19/2023) for T2DM follow up.  Camillia Marcy [name], MD [facility]

## 2023-05-20 NOTE — Assessment & Plan Note (Signed)
 Symptoms and physical exam consistent with seasonal allergies. -Cetirizine 10 mg daily -Fluticasone nasal spray daily

## 2023-05-20 NOTE — Patient Instructions (Signed)
 It was great to see you today! Thank you for choosing [facility] for your primary care.  Today we addressed: 1. Allergy, initial encounter I am sending Zyrtec and fluticasone spray.  Please take those daily, otherwise they do not work.  2. Type 2 diabetes mellitus with complication (HCC) I sent you blood glucose check devices, please check your blood sugar every morning at the same time daily. I have also sent refills on your Trulicity, but that will be expensive, so I will look into other options.  Please keep a close eye on the calluses on your feet and let us know immediately if they are bleeding.  Please dry your feet very well after showers/soaks.  Schedule your colonoscopy to help detect colon cancer. The stomach doctors will call to schedule an appt with you. If you decide against this, please ask about the cologuard as an option.  If you had a referral placed, they will call you to set up an appointment. Please give [place] a call if you don't hear back in the next 2 weeks.  You should return to our clinic in 1 month.  Thank you for coming to see [place] at [facility] and for the opportunity to care for you! [name], Travontae Freiberger, MD 05/20/2023, 9:30 AM

## 2023-05-21 ENCOUNTER — Telehealth: Payer: Self-pay | Admitting: Family Medicine

## 2023-05-21 NOTE — Telephone Encounter (Signed)
 Called patient and daughter, verified DOB.  Verified that [employer] request for information is valid.  Filled out form documenting history of NSTEMI on 12/23/2018 and subsequent care for T2DM, HTN, HLD, CAD.  Noted seeming return to prior function after NSTEMI, but that NSTEMI does carry risks for heart health and physical activity may be limited by heart attack.  Insurance also requesting ALL documentation about patient's care including labs, imaging, and PT notes from 12/23/2018 through 05/21/2023.  Placed form in RN box per Dr. Pollie Meyer.  Patient reportedly coming on Monday to pick it up, but not first thing in the morning.  Please scan form and provide copy for patient.  Will also need to fax back to insurance company with requested documentation attached.  Thank you for your help.

## 2023-05-23 ENCOUNTER — Encounter: Payer: Self-pay | Admitting: Cardiovascular Disease

## 2023-05-23 NOTE — Progress Notes (Signed)
 Cardiology Office Note    Date:  05/23/2023   ID:  Keith Newton, DOB 1955/03/20, MRN [id number]  PCP:  Keith Shi, MD  Cardiologist:  Keith Guadalajara, MD    18 months F/U office visit   History of Present Illness:  Keith Newton is a 68 y.o. male who I saw for his initial post hospital evaluation in January 2021.  I last saw him on November 17, 2021.  He had recently been given life and disability claims that needed to be filled out and now presents to me prior to me completing this request from his insurance company.  Keith Newton has a history of diabetes mellitus, obesity, and was hospitalized on December 23, 2018 after awakening from sleep with severe chest pain.  I had seen him for initial evaluation and blood pressure on presentation was 181/114 with initial high-sensitivity troponin at 4628.  He underwent emergent cardiac catheterization by Dr. Eldridge Newton and was found to have an 80% proximal gnosis followed by total occlusion of the RCA.  He underwent successful stenting with tandem 3.0x16 and 2.75 x 38 mm Synergy stents.  He had concomitant CAD with 40% proximal LAD stenosis, 25% mid LAD stenosis, 50% mid circumflex stenosis and 75% ostial PDA stenosis.  An echo Doppler study showed preserved global LV function with EF 55 to 60%.  There was moderate LVH and basal inferior hypokinesis.  The patient has been seen by Keith Shelter, PA-C, on 2 occasions following his hospitalization with his last visit in November 2020.    I saw him for initial evaluation with me on March 01, 2019.  He had lost almost 30 pounds since his myocardial infarction.  He denied any definitive anginal type symptoms but at times does experience a vague chest sensation.  Of note, he was awakened from sleep with his initial MI.  He also admits that he continues to have difficulty with sleep and at times he feels that he cannot get enough air while sleeping.  He does admit to snoring, frequent awakenings, and nocturia  at least 3 times per night.  He also has noticed that his pulse rate tends to be increased and at times increases to over 100 in a regular rhythm.  During that evaluation, I discussed with him my concern that his initial event awakened him from a sound sleep and was concerned about obstructive sleep apnea contributing to nocturnal hypoxemia contributing to his acute coronary syndrome.  He had a telemedicine evaluation by Keith Newton, [facility] on on April 27, 2019.  He had noted some mild intermittent chest pains that were not exertionally precipitated and also complained of some numbness in his feet.  He was told to take Imdur in the morning instead of at bedtime.  I saw him in April 2021 which times he was experiencing rare nonexertional episodes of chest discomfort.  At times it can last up to 20 to 25 minutes and then resolved.  He has continued to be on isosorbide 30 mg in addition to metoprolol tartrate 50 mg twice a day for anti-ischemic medication.  He was on aspirin and Brilinta for DAPT.  He was on atorvastatin 80 mg and LDL cholesterol was 55 on March 01 2019. He is diabetic on Tresiba, Farxiga and Metformin.    I saw him for preoperative cardiology clearance prior to undergoing elective soldier surgery. He had a Myoview study on November 17, 2019 which was low risk with a nuclear EF of 53%  and normal wall motion.  He was evaluated  in December 2021 and was cleared to undergo right  shoulder surgery.  With the surge in Covid, this elective surgery was canceled.  He was recently seen on April 23, 2020 by Keith Newton and had complaints of an intermittent chest sensation.  He was unable to exercise secondary to neuropathy.  He walks to the mailbox and back without discomfort.  He was referred for a Lexiscan Myoview study.   The formal report was interpreted as intermediate study with prior MI with peri-infarct ischemia with concern for possible defect in the very basal septal area.   When I saw him on May 06, 2020 he remained stable and denied any chest pain or shortness of breath.  He had experienced was a transient fluttering in his chest and not chest tightness.  This fluttering sensation is completely different from his MI pain when he was found to have total occlusion of his RCA.  At the time he had mild concomitant CAD with 75% PDA stenosis.  EF was 55 to 60% with basal hypokinesis by echo.    He was evaluated by Keith Flesher, PA on Jun 26, 2021 at which time he was still having some intermittent chest pain.  He had stopped all of his cardiac medications because he did not feel well.  He was advised to gradually resume his cardiac medications in a stepwise fashion.  He presented to the emergency room on Jul 11, 2021 with elevated blood pressure.  Troponins were negative.  At that time he was restarted on beta-blocker therapy.  He was evaluated in our office on July 29, 2021 by Keith Newton, [place].  He was here with his daughter.  Apparently the patient had noticed some vague chest pain and also some dizziness.  Apparently he was taking his old metoprolol 75 mg twice a day and he was advised to decrease this down to 50 mg twice a day.  He was scheduled to undergo a PET stress test for further evaluation.  On November 04, 2021 he underwent a nuclear medicine PET/CT cardiac perfusion study.  This study was normal without evidence for scar or ischemia.  Resting LV function EF was 55% with stress EF 61%.  There was no evidence for transient ischemic dilatation.  Myocardial blood flow was considered normal.  When evaluated on November 17, 2021 he was on a medical regimen of  aspirin 81 mg daily, ticagrelor, isosorbide 30 mg, Farxiga 5 mg, metoprolol tartrate 50 mg twice a day, Tresiba insulin and  atorvastatin 80 mg.  Previously he was on Zetia 10 mg.  LDL cholesterol in January 2023 was 58.  He presents to the office today.  His daughter was on the phone throughout the evaluation and involved in the  assessment.  Apparently, a [employer] insurance company life and disability claim had been sent to our office for me to comment on.  I had not seen the patient in several years and requested I see the patient prior to filling this out.  The issue is related to the patient's ability to work.  Apparently the patient had worked for the city of [place].  But he tells me he had retired from work due to inability to do the lifting and work requirements needed for his job.  He is now 68 years old.  He has been retired since January 2020 and his myocardial infarction was in November 2020.  He presents for evaluation with his  daughter on the telephone.  Past Medical History:  Diagnosis Date   Arthritis    Back pain    CAD (coronary artery disease)    a. s/p NSTEMI in 12/2018 with 80% Prox-RCA stenosis followed by 100% prox to mid-RCA stenosis --> DESx2 to the RCA. Med management of residual disease.    Carpal tunnel syndrome    DDD (degenerative disc disease)    Diabetes mellitus    type 2   Diabetic retinopathy (HCC)    Hyperlipidemia    Hypertension    states very mild   Low testosterone    Myocardial infarction (HCC)    Overweight    Seasonal allergies    Tachycardia     Past Surgical History:  Procedure Laterality Date   CARDIAC CATHETERIZATION     CARPAL TUNNEL RELEASE  07/02/2011   Procedure: CARPAL TUNNEL RELEASE;  Surgeon: Wyn Forster., MD;  Location: [facility];  Service: Orthopedics;  Laterality: Right;   CARPAL TUNNEL RELEASE  07/30/2011   Procedure: CARPAL TUNNEL RELEASE;  Surgeon: Wyn Forster., MD;  Location: [facility];  Service: Orthopedics;  Laterality: Left;   CORONARY ANGIOPLASTY     CORONARY STENT INTERVENTION N/A 12/23/2018   Procedure: CORONARY STENT INTERVENTION;  Surgeon: Corky Crafts, MD;  Location[facility] CV LAB;  Service: Cardiovascular;  Laterality: N/A;   LEFT HEART CATH AND CORONARY ANGIOGRAPHY N/A  12/23/2018   Procedure: LEFT HEART CATH AND CORONARY ANGIOGRAPHY;  Surgeon: Corky Crafts, MD;  Location: [facility] INVASIVE CV LAB;  Service: Cardiovascular;  Laterality: N/A;   LUMBAR EPIDURAL INJECTION     x2   SHOULDER ARTHROSCOPY WITH OPEN ROTATOR CUFF REPAIR AND DISTAL CLAVICLE ACROMINECTOMY Right 06/03/2020   Procedure: right shoulder arthroscopy, biceps tenodesis, mini open rotator cuff tear repair subscap/supraspinatus, lateral debridement;  Surgeon: Cammy Copa, MD;  Location: [facility] OR;  Service: Orthopedics;  Laterality: Right;    Current Medications: Outpatient Medications Prior to Visit  Medication Sig Dispense Refill   acetaminophen (TYLENOL) 325 MG tablet Take 2 tablets (650 mg total) by mouth every 6 (six) hours as needed. (Patient taking differently: Take 1,950 mg by mouth every 12 (twelve) hours as needed.)     aspirin EC 81 MG EC tablet Take 1 tablet (81 mg total) by mouth daily.     atorvastatin (LIPITOR) 80 MG tablet Take 1 tablet (80 mg total) by mouth daily. 90 tablet 3   dapagliflozin propanediol (FARXIGA) 10 MG TABS tablet Take 1 tablet (10 mg total) by mouth daily. 30 tablet 3   ezetimibe (ZETIA) 10 MG tablet Take 1 tablet (10 mg total) by mouth daily. 90 tablet 3   insulin degludec (TRESIBA FLEXTOUCH) 100 UNIT/ML FlexTouch Pen Inject 50 Units into the skin daily. Inject 24 units into the skin daily. Increase by 2 units daily for a max dose of 40 units daily. (Patient taking differently: Inject 50 Units into the skin daily.) 15 mL 1   isosorbide mononitrate (IMDUR) 30 MG 24 hr tablet Take 1 tablet (30 mg total) by mouth daily. 90 tablet 3   metoprolol succinate (TOPROL-XL) 100 MG 24 hr tablet Take 1 tablet (100 mg total) by mouth at bedtime. Take with or immediately following a meal. 90 tablet 3   nitroGLYCERIN (NITROSTAT) 0.4 MG SL tablet Place 1 tablet (0.4 mg total) under the tongue every 5 (five) minutes x 3 doses as needed for chest pain. (Patient not taking:  Reported  on 05/20/2023) 25 tablet 3   pregabalin (LYRICA) 75 MG capsule TAKE 1 CAPSULE BY MOUTH 3 TIMES DAILY AS NEEDED. 90 capsule 1   Accu-Chek Softclix Lancets lancets Use as instructed. Once daily testing. (Patient not taking: Reported on 05/20/2023) 100 each 12   diclofenac Sodium (VOLTAREN) 1 % GEL Apply 2 g topically 4 (four) times daily. 100 g 3   Blood Glucose Monitoring Suppl (ACCU-CHEK GUIDE) w/Device KIT Use to monitor glucose daily. (Patient not taking: Reported on 05/20/2023) 1 kit 0   cetirizine (ZYRTEC) 10 MG tablet Take 1 tablet (10 mg total) by mouth daily. (Patient not taking: Reported on 05/20/2023) 30 tablet 11   Dulaglutide (TRULICITY) 3 MG/0.5ML SOAJ Inject 3 mg as directed once a week. (Patient not taking: Reported on 05/20/2023) 2 mL 2   Elastic Bandages & Supports (MEDICAL COMPRESSION STOCKINGS) MISC Wear daily (Patient not taking: Reported on 05/20/2023) 2 each 1   fluticasone (FLONASE) 50 MCG/ACT nasal spray Place 1 spray into both nostrils daily. 1 spray in each nostril every day (Patient not taking: Reported on 05/20/2023) 16 g 2   glucose blood (ACCU-CHEK GUIDE) test strip Use as instructed - once daily testing (Patient not taking: Reported on 05/20/2023) 100 each 12   Lancet Devices (ONE TOUCH DELICA LANCING DEV) MISC Check fasting blood glucose in AM and before giving insulin. (Patient not taking: Reported on 05/20/2023) 1 each 11   No facility-administered medications prior to visit.     Allergies:   Codeine   Social History   Socioeconomic History   Marital status: Married    Spouse name: Harris Penton   Number of children: 2   Years of education: 16   Highest education level: Bachelor's degree (e.g., BA, AB, BS)  Occupational History   Occupation: retired  Tobacco Use   Smoking status: Never   Smokeless tobacco: Never  Vaping Use   Vaping status: Never Used  Substance and Sexual Activity   Alcohol use: No   Drug use: No   Sexual activity: Not on file  Other Topics  Concern   Not on file  Social History Narrative   Not on file   Social Drivers of Health   Financial Resource Strain: High Risk (06/27/2021)   Overall Financial Resource Strain (CARDIA)    Difficulty of Paying Living Expenses: Hard  Food Insecurity: Food Insecurity Present (07/21/2021)   Hunger Vital Sign    Worried About Running Out of Food in the Last Year: Often true    Ran Out of Food in the Last Year: Often true  Transportation Needs: No Transportation Needs (06/27/2021)   PRAPARE - Administrator, Civil Service (Medical): No    Lack of Transportation (Non-Medical): No  Physical Activity: Inactive (02/14/2019)   Exercise Vital Sign    Days of Exercise per Week: 0 days    Minutes of Exercise per Session: 0 min  Stress: No Stress Concern Present (02/14/2019)   [employer] of Occupational Health - Occupational Stress Questionnaire    Feeling of Stress : Only a little  Social Connections: Unknown (07/01/2021)   [facility]   Social Network    Social Network: Not on file     Family History:  The patient's family history includes CAD in his father, maternal grandfather, and paternal grandfather; Stroke in his mother.   ROS General: Negative; No fevers, chills, or night sweats;  HEENT: Negative; No changes in vision or hearing, sinus congestion,  difficulty swallowing Pulmonary: Negative; No cough, wheezing, shortness of breath, hemoptysis Cardiovascular: See HPI GI: Negative; No nausea, vomiting, diarrhea, or abdominal pain GU: Negative; No dysuria, hematuria, or difficulty voiding Musculoskeletal: Negative; no myalgias, joint pain, or weakness Hematologic/Oncology: Negative; no easy bruising, bleeding Endocrine: Positive for diabetes Neuro: Negative; no changes in balance, headaches Skin: Negative; No rashes or skin lesions Psychiatric: Negative; No behavioral problems, depression Sleep: Poor sleep with frequent awakenings,  snoring, and at times a sensation that he cannot get enough breath.  No bruxism, restless legs, hypnogognic hallucinations, no cataplexy Other comprehensive 14 point system review is negative.   PHYSICAL EXAM:   VS:  BP 92/76   Pulse (!) 101   Ht 5\' 8"  (1.727 m)   Wt 248 lb (112.5 kg)   SpO2 96%   BMI 37.71 kg/m     Repeat blood pressure by me was improved by me at 110/80  Wt Readings from Last 3 Encounters:  05/20/23 243 lb 6.4 oz (110.4 kg)  05/18/23 248 lb (112.5 kg)  04/26/23 238 lb 3.2 oz (108 kg)    General: Alert, oriented, no distress.  Moderate obesity Skin: normal turgor, no rashes, warm and dry HEENT: Normocephalic, atraumatic. Pupils equal round and reactive to light; sclera anicteric; extraocular muscles intact;  Nose without nasal septal hypertrophy Mouth/Parynx benign; Mallinpatti scale 3 Neck: No JVD, no carotid bruits; normal carotid upstroke Lungs: clear to ausculatation and percussion; no wheezing or rales Chest wall: without tenderness to palpitation Heart: PMI not displaced, RRR, s1 s2 normal, 1/6 systolic murmur, no diastolic murmur, no rubs, gallops, thrills, or heaves Abdomen: soft, nontender; no hepatosplenomehaly, BS+; abdominal aorta nontender and not dilated by palpation. Back: no CVA tenderness Pulses 2+ Musculoskeletal: full range of motion, normal strength, no joint deformities Extremities: no clubbing cyanosis or edema, Homan's sign negative  Neurologic: grossly nonfocal; Cranial nerves grossly wnl Psychologic: Normal mood and affect  Studies/Labs Reviewed:   EKG Interpretation Date/Time:  Tuesday May 18 2023 12:45:45 EDT Ventricular Rate:  100 PR Interval:  166 QRS Duration:  90 QT Interval:  344 QTC Calculation: 443 R Axis:   18  Text Interpretation: Normal sinus rhythm Possible Inferior infarct , age undetermined When compared with ECG of 11-Jul-2021 07:22, PREVIOUS ECG IS PRESENT Confirmed by Keith Newton ([id number]) on 05/23/2023  12:49:53 PM    November 17, 2021 ECG (independently read by me): NSR at 84, mild T wave abnormaity in III, aVF  May 06, 2020 ECG (independently read by me): NSR at 82; small inferior Q wave in III. No ectopy  June 02, 2019 ECG (independently read by me): Sinus rhythm with sinus arrhythmia.  Mild T wave abnormality inferiorly.  QTc interval 419 ms.  PR interval 160 ms.  March 01, 2019 ECG (independently read by me): Normal sinus rhythm at 96 bpm.  Q wave in lead III.  QTc interval 434 ms.  No ectopy.  Recent Labs:    Latest Ref Rng & Units 12/17/2022    2:56 PM 07/20/2022   11:11 AM 07/11/2021    7:31 AM  BMP  Glucose 70 - 99 mg/dL 657  846  962   BUN 8 - 27 mg/dL 15  16  9    Creatinine 0.76 - 1.27 mg/dL 9.52  8.41  3.24   BUN/Creat Ratio 10 - 24 15  15     Sodium 134 - 144 mmol/L 138  139  137   Potassium 3.5 - 5.2 mmol/L 4.3  4.4  4.0   Chloride 96 - 106 mmol/L 102  103  103   CO2 20 - 29 mmol/L 19  21  24    Calcium 8.6 - 10.2 mg/dL 52.8  9.2  9.6         Latest Ref Rng & Units 03/01/2019   10:51 AM 12/25/2018    8:08 AM  Hepatic Function  Total Protein 6.0 - 8.5 g/dL 7.5  6.8   Albumin 3.8 - 4.8 g/dL 4.7  3.2   AST 0 - 40 IU/L 18  51   ALT 0 - 44 IU/L 23  25   Alk Phosphatase 39 - 117 IU/L 84  52   Total Bilirubin 0.0 - 1.2 mg/dL 0.8  1.0        Latest Ref Rng & Units 07/11/2021    7:31 AM 08/13/2020    9:49 AM 06/03/2020    7:19 AM  CBC  WBC 4.0 - 10.5 K/uL 5.9  6.3  6.0   Hemoglobin 13.0 - 17.0 g/dL 41.3  24.4  01.0   Hematocrit 39.0 - 52.0 % 47.9  46.9  45.4   Platelets 150 - 400 K/uL 281  330  306    Lab Results  Component Value Date   MCV 87.6 07/11/2021   MCV 85 08/13/2020   MCV 89.2 06/03/2020   Lab Results  Component Value Date   TSH 1.490 08/13/2020   Lab Results  Component Value Date   HGBA1C 13.2 (A) 04/02/2023     BNP No results found for: "BNP"  ProBNP No results found for: "PROBNP"   Lipid Panel     Component Value Date/Time    CHOL 119 07/20/2022 1111   TRIG 103 07/20/2022 1111   HDL 35 (L) 07/20/2022 1111   CHOLHDL 3.4 07/20/2022 1111   CHOLHDL 6.6 12/24/2018 0214   VLDL 25 12/24/2018 0214   LDLCALC 65 07/20/2022 1111   LDLDIRECT 58 02/21/2021 1007   LABVLDL 19 07/20/2022 1111     RADIOLOGY: No results found.    Additional studies/ records that were reviewed today include:   EMERGENT CATH/PCI: 12/23/2018 Prox - mid RCA-2 lesion is 100% stenosed. This was the culprit lesion. A drug-eluting stent was successfully placed using a STENT SYNERGY DES [id number]. Post intervention, there is a 0% residual stenosis. Prox RCA-1 lesion is 80% stenosed. A drug-eluting stent was successfully placed using a STENT SYNERGY DES [id number], overlapping the first stent. Post intervention, there is a 0% residual stenosis. RPDA lesion is 75% stenosed. This was a small vessel. Medical treatment planned. Mid Cx lesion is 50% stenosed. Mid LAD lesion is 40% stenosed. Mid LAD to Dist LAD lesion is 25% stenosed. The left ventricular systolic function is normal. LV end diastolic pressure is normal. The left ventricular ejection fraction is 55-65% by visual estimate. There is no aortic valve stenosis. Tortuosity in right subclavian did not allow stiffer Cordis XBRCA catheter to pass into ascending aorta. Medtronic Launcher catheter was able to pass from right radial approach.   Watch in 2H.  Continue aggressive secondary prevention.  He needs weight loss and DM control, high dose statin and beta blocker.    He is interested in learning more about exercise and healthy diet.  Recommend cardiac rehab.    DAPT for 1 year given ACS.    Intervention     ECHO 12/23/2018 IMPRESSIONS  1. Left ventricular ejection fraction, by visual estimation, is 55 to 60%. The left ventricle has normal function. There is  moderately increased left ventricular hypertrophy. Basal inferior hypokinesis.  2. Global right ventricle has mildly reduced  systolic function.The right ventricular size is normal. No increase in right ventricular wall thickness.  3. Left atrial size was normal.  4. Right atrial size was normal.  5. The mitral valve is normal in structure. No evidence of mitral valve regurgitation.  6. The tricuspid valve is normal in structure. Tricuspid valve regurgitation is trivial.  7. The aortic valve is tricuspid. Aortic valve regurgitation is not visualized.  8. The pulmonic valve was not well visualized. Pulmonic valve regurgitation is trivial.   NM PET CT CARDIAC PERFUSION: 11/04/2021   The study is normal. The study is low risk.   LV perfusion is normal. There is no evidence of ischemia. There is no evidence of infarction.   Rest left ventricular function is normal. Rest EF: 55 %. Stress left ventricular function is normal. Stress EF: 61 %. End diastolic cavity size is normal. End systolic cavity size is normal. No evidence of transient ischemic dilation (TID) noted.   Myocardial blood flow was computed to be 0.66ml/g/min at rest and 1.20ml/g/min at stress. Global myocardial blood flow reserve was 2.49 and was normal.   Coronary calcium assessment not performed due to prior revascularization.   Electronically signed by: Parke Poisson, MD  ASSESSMENT:    1. Coronary artery disease involving native coronary artery of native heart with angina pectoris (HCC)   2. Primary hypertension     PLAN:  Keith Newton is a 68 year old gentleman who has a history of obesity, diabetes mellitus, and was awakened from sleep with severe chest pain on December 23, 2018.  He underwent emergent catheterization which revealed total RCA occlusion which was successfully stented with tandem Synergy DES stents.  He had significant salvage of myocardium with  his initial echo Doppler study showed an EF of 55 to 60% with only a small area of basal inferior hypokinesis.  The patient has noticed increased heart rate since his MI despite being on  metoprolol tartrate 25 mg twice a day.  At his initial evaluation with me in the office in January 2021 ECG showed sinus rhythm at 96 bpm with a Q-wave in lead III.  He was hypertensive when his blood pressure was initially taken by the nurse but on repeat by me was significantly improved at 112/70.  At that time I recommended further titration of metoprolol to 50 mg twice a day and he continued to be on low-dose isosorbide mononitrate.  Continued medical therapy was recommended for his concomitant CAD.  At that evaluation I also discussed possible sleep evaluation with snoring, frequent awakenings, nocturia 3 times per night with difficulty in sleeping.  Although a sleep study was strongly recommended this was never pursued by the patient.  He had undergone nuclear medicine evaluations for preoperative shoulder surgery in March 2022.  Since I had seen him, he had had issues with blood pressure control when he had stopped taking his medications.  Most recently, his blood pressure has normalized on a regimen of isosorbide 30 mg, metoprolol tartrate 50 mg twice a day.  He is on atorvastatin 80 mg for hyperlipidemia.  He is diabetic and was on metformin but is now just on to see but insulin.  He continues to be on aspirin/ticagrelor for his CAD and stents.  He  developed some recurrent chest discomfort leading to further evaluation with a nuclear medicine PET CT cardiac perfusion study which was normal then did  not show any significant scar or ischemia with rest EF 55% increasing to 61% with stress.  Keith Newton had previously worked for [employer].  He had difficulties walking and also difficulties with carrying significant baggage which was required for his job.  As result, he tells me that he retired from work in January 2000 because of these issues.  His myocardial infarction was in November 2020 and at the time of his MI he already was retired.  He has not been working since.  Initial blood pressure today was low  but was improved on repeat evaluation.  He continues to be on atorvastatin 80 mg and Zetia 10 mg for hyperlipidemia with target LDL less than 70 and preferably less than 55.  He has been on isosorbide 30 mg of metoprolol succinate remotely 100 mg.  He is diabetic on Trulicity as well as [place] FlexTouch insulin.  He has neuropathy on Lyrica.  He sees: Family practice for primary care and currently has been seeing Dr. Ellie Lunch.  His ECG today is stable but heart rate is increased at 100 bpm.  With low blood pressure medication was not increased.  I discussed with him my upcoming retirement.  He will return to his primary care.  For future cardiology evaluation I have suggested transition to Dr. Herbie Newton with 1 year evaluation as needed.   Medication Adjustments/Labs and Tests Ordered: Current medicines are reviewed at length with the patient today.  Concerns regarding medicines are outlined above.  Medication changes, Labs and Tests ordered today are listed in the Patient Instructions below. Patient Instructions  Medication Instructions:  No labs were ordered during today's visit.  *If you need a refill on your cardiac medications before your next appointment, please call your pharmacy*   Lab Work: No labs were ordered during today's visit.  If you have labs (blood work) drawn today and your tests are completely normal, you will receive your results only by: MyChart Message (if you have MyChart) OR A paper copy in the mail If you have any lab test that is abnormal or we need to change your treatment, we will call you to review the results.   Testing/Procedures: No procedures were ordered during today's visit.    Follow-Up: At [facility], you and your health needs are our priority.  As part of our continuing mission to provide you with exceptional heart care, we have created designated Provider Care Teams.  These Care Teams include your primary Cardiologist (physician) and Advanced  Practice Providers (APPs -  Physician Assistants and Nurse Practitioners) who all work together to provide you with the care you need, when you need it.  We recommend signing up for the patient portal called "MyChart".  Sign up information is provided on this After Visit Summary.  MyChart is used to connect with patients for Virtual Visits (Telemedicine).  Patients are able to view lab/test results, encounter notes, upcoming appointments, etc.  Non-urgent messages can be sent to your provider as well.   To learn more about what you can do with MyChart, go to [email or web address].    Your next appointment:   1 year(s)  Provider:   Dr. Herbie Newton     Other Instructions [facility]  [address] [place], [place] [address] OPENING APRIL [phone]       1st Floor: - Lobby - Registration  - Pharmacy  - Lab - Cafe   2nd Floor: - PV Lab - Diagnostic Testing (echo, CT, nuclear med)  3rd Floor: - Vacant   4th Floor: - TCTS (cardiothoracic surgery) - AFib Clinic - Structural Heart Clinic - Vascular Surgery  - Vascular Ultrasound   5th Floor: - HeartCare Cardiology (general and EP) - Clinical Pharmacy for coumadin, hypertension, lipid, weight-loss medications, and med management appointments      Valet parking services will be available as well.           Signed, Keith Guadalajara, MD  05/23/2023 12:49 PM    [facility] [address], [address], [place], [place]  [address] Phone: [phone]

## 2023-05-24 NOTE — Telephone Encounter (Signed)
 Copy made and placed in batch scanning.   Original placed in medical records bin for supporting documentation.   Please contact patient once this has been completed.  Veronda Prude, RN

## 2023-06-03 ENCOUNTER — Telehealth: Payer: Self-pay | Admitting: Cardiovascular Disease

## 2023-06-03 NOTE — Telephone Encounter (Signed)
 Pt came to office with letter received from insurance company needing further information. Form scanned into pt's chart.  JB, 06-03-23

## 2023-06-11 ENCOUNTER — Other Ambulatory Visit: Payer: Self-pay | Admitting: Family Medicine

## 2023-06-11 ENCOUNTER — Encounter: Payer: Self-pay | Admitting: Gastroenterology

## 2023-06-18 ENCOUNTER — Ambulatory Visit: Admitting: Family Medicine

## 2023-06-18 NOTE — Assessment & Plan Note (Deleted)
-   Last A1c:  Lab Results  Component Value Date   HGBA1C 13.2 (A) 04/02/2023  - Medications: *** - ***

## 2023-06-18 NOTE — Progress Notes (Deleted)
   SUBJECTIVE:   CHIEF COMPLAINT / HPI:  Keith Newton is a 68 y.o. male with a pertinent past medical history of  poorly controlled T2DM on insulin  with neuropathy, HTN, HLD, CAD, and Hx NSTEM presenting to the clinic for T2DM follow up.  Type 2 Diabetes Last A1c *** today Home CBGs: *** Medications: Dapagliflozin  10 mg daily, Trulicity  3 mg weekly (insurance no longer covering), Tresiba  50u daily  Adherence: *** Eye exam: UTD, reportedly hyperglycemia causing difficulty seeing Foot exam: UTD, completed April 2025 Microalbumin: UTD, WNL in November Statin: Atorvastatin  80 mg daily, ezetimibe  10 mg daily *** symptoms of hypoglycemia, polyuria, polydipsia, numbness of extremities, foot ulcers/trauma  Colonoscopy call?*** Shingrix?***   PERTINENT PMH / PSH: Poorly controlled T2DM on insulin  with neuropathy, HTN, HLD, CAD, and Hx NSTEMI Family history of colon cancer Patient once again confirms that he does not need disability paperwork completed that keeps coming to my inbox every few weeks.***   OBJECTIVE:   There were no vitals taken for this visit. Physical Exam General: Age-appropriate, resting comfortably in chair, NAD, alert and at baseline. HEENT:  Head: Normocephalic, atraumatic. No tenderness to percussion over sinuses. Eyes: PERRLA. No conjunctival erythema or scleral injections. Ears: TMs non-bulging and non-erythematous bilaterally. No erythema of external ear canal. No cerumen impaction. Nose: Non-erythematous turbinates. No rhinorrhea. Mouth/Oral: Clear, no tonsillar exudate. MMM. Neck: Supple. No LAD. Cardiovascular: Regular rate and rhythm. Normal S1/S2. No murmurs, rubs, or gallops appreciated. 2+ radial pulses. Pulmonary: Clear bilaterally to ascultation. No wheezes, crackles, or rhonchi. Normal WOB on room air. No accessory muscle use. Abdominal: No tenderness to deep or light palpation. No rebound or guarding. No HSM. Skin: Warm and dry. Extremities: No  peripheral edema bilaterally. Capillary refill <2 seconds.  No results found for this or any previous visit (from the past 48 hours).   ASSESSMENT/PLAN:   Assessment & Plan Type 2 diabetes mellitus with complication (HCC) {disease control degree:315147} - Last A1c:  Lab Results  Component Value Date   HGBA1C 13.2 (A) 04/02/2023  - Medications: *** - ***  Healthcare maintenance Health Maintenance Immunizations: Will get Shingrix at pharmacy soon.*** Lipid screening: BMP and lipid panel obtained today*** Colonoscopy: Never done, referred 1 month ago, Auth for [employer], still no call received, phone # provided ([phone]) Advanced Directive:   No follow-ups on file.  Jamieon Lannen Lansing Planas, MD [facility]

## 2023-06-29 ENCOUNTER — Telehealth: Payer: Self-pay | Admitting: Cardiovascular Disease

## 2023-06-29 NOTE — Telephone Encounter (Addendum)
 Pt came into office with written note from [employer]: [id number] or [phone] (scanned into pt's chart) requesting note from Dr. Loetta Ringer regarding pt's heart attack in 12/2018.  Please see previous notes for updates.  JB, 06-29-23

## 2023-07-20 ENCOUNTER — Telehealth: Payer: Self-pay | Admitting: Family Medicine

## 2023-07-20 NOTE — Telephone Encounter (Signed)
 Patient was identified as falling into the [place] Measure - Diabetes.   Patient was: Left voicemail to schedule with primary care provider.

## 2023-07-20 NOTE — Telephone Encounter (Signed)
 Opened in error

## 2023-07-26 ENCOUNTER — Encounter: Payer: Self-pay | Admitting: Student

## 2023-07-26 ENCOUNTER — Ambulatory Visit (INDEPENDENT_AMBULATORY_CARE_PROVIDER_SITE_OTHER): Admitting: Student

## 2023-07-26 VITALS — BP 122/74 | HR 81

## 2023-07-26 DIAGNOSIS — E1142 Type 2 diabetes mellitus with diabetic polyneuropathy: Secondary | ICD-10-CM | POA: Diagnosis not present

## 2023-07-26 DIAGNOSIS — E118 Type 2 diabetes mellitus with unspecified complications: Secondary | ICD-10-CM | POA: Diagnosis not present

## 2023-07-26 LAB — POCT GLYCOSYLATED HEMOGLOBIN (HGB A1C): HbA1c, POC (controlled diabetic range): 12.7 % — AB (ref 0.0–7.0)

## 2023-07-26 MED ORDER — PREGABALIN 75 MG PO CAPS: ORAL_CAPSULE | ORAL | 1 refills | Status: DC

## 2023-07-26 MED ORDER — TRESIBA FLEXTOUCH 100 UNIT/ML ~~LOC~~ SOPN: 50.0000 [IU] | PEN_INJECTOR | Freq: Every day | SUBCUTANEOUS | 1 refills | Status: DC

## 2023-07-26 MED ORDER — ACCU-CHEK SOFTCLIX LANCETS MISC: 12 refills | Status: DC

## 2023-07-26 MED ORDER — GLUCOSE BLOOD VI STRP: ORAL_STRIP | 12 refills | Status: DC

## 2023-07-26 NOTE — Patient Instructions (Addendum)
 Pleasure to meet you today.  Your A1c today is still very elevated at 12.7%.  Your neuropathy is most likely due to you not taking your Lyrica  and your elevated blood sugars.  Please make sure you are taking your insulins daily as prescribed.  For your insulin  please take 24 units every day and go up by 2 units everyday but not more than 40 units until your blood sugar is below 160.    I have sent in a refill for your Lyrica  which you should take 75 mg 3 times daily.  Check your blood sugars fasting every morning, keep a daily log of your readings and follow-up in 2 weeks or so reassess your sugars.

## 2023-07-26 NOTE — Progress Notes (Signed)
    SUBJECTIVE:   CHIEF COMPLAINT / HPI:   -68 year old male with history of type 2 diabetes - Presented today for diabetes follow-up - Last A1c months ago was at 13.2% - Current medication includes: Farxiga  10 mg daily, Tresiba  24u every day - Hasn't been on his Trulicity  0.75 mg weekly due to copay cost - Per daughter he is inconsistent with his insulin  use unsure of the last time he is taking them. - Other medication include pregabalin  75 mg TID, Lipitor  80 mg daily, Zetia  10 mg daily. - Does not check home blood sugars because he has been out of his drips - No hypoglycemic episode, hyperglycemic episodes. -Reports bilateral numbness in his foot has been worse since he ran out of his Lyrica  a few weeks ago.  PERTINENT  PMH / PSH: Reviewed   OBJECTIVE:   BP 122/74   Pulse 81   SpO2 94%    Physical Exam General: Alert, well appearing, NAD Cardiovascular: RRR, No Murmurs, Normal S2/S2 Respiratory: CTAB, No wheezing or Rales Abdomen: No distension or tenderness Extremities: No edema on extremities. NVI in both feet  ASSESSMENT/PLAN:   Type 2 diabetes mellitus with complication (HCC) A1c today improved to 12.7% which is far from goal. Poorly controlled most likely due to noncompliance.  Currently endorses neuropathy in his foot bilaterally -Restart insulin  24 units daily crease by 2 units up to 40U until fasting CBG is less <160 -Continue Farxiga  as prescribed - Refilled patient's glucometer strips and lancets -Patient advised to check daily blood sugars in the morning and keep a log - Follow-up in 2 weeks to reassess blood -At follow-up we will look into GLP-1 options for patients - Refilled Lyrical for neuropathy    Goble Last, MD [facility]

## 2023-07-26 NOTE — Assessment & Plan Note (Signed)
 A1c today improved to 12.7% which is far from goal. Poorly controlled most likely due to noncompliance.  Currently endorses neuropathy in his foot bilaterally -Restart insulin  24 units daily crease by 2 units up to 40U until fasting CBG is less <160 -Continue Farxiga  as prescribed - Refilled patient's glucometer strips and lancets -Patient advised to check daily blood sugars in the morning and keep a log - Follow-up in 2 weeks to reassess blood -At follow-up we will look into GLP-1 options for patients - Refilled Lyrical for neuropathy

## 2023-07-28 ENCOUNTER — Ambulatory Visit: Admitting: Student

## 2023-07-28 ENCOUNTER — Telehealth: Payer: Self-pay

## 2023-07-28 ENCOUNTER — Other Ambulatory Visit (HOSPITAL_COMMUNITY): Payer: Self-pay

## 2023-07-28 DIAGNOSIS — E118 Type 2 diabetes mellitus with unspecified complications: Secondary | ICD-10-CM

## 2023-07-28 NOTE — Telephone Encounter (Signed)
 Patients insurance covers AccuCheck Guide Test Strips. Please send new RX to patients pharmacy. Thanks!

## 2023-07-29 MED ORDER — GLUCOSE BLOOD VI STRP: ORAL_STRIP | 12 refills | Status: DC

## 2023-07-30 ENCOUNTER — Other Ambulatory Visit (HOSPITAL_COMMUNITY): Payer: Self-pay

## 2023-08-09 ENCOUNTER — Ambulatory Visit: Admitting: Student

## 2023-08-17 DIAGNOSIS — H5213 Myopia, bilateral: Secondary | ICD-10-CM | POA: Diagnosis not present

## 2023-08-17 DIAGNOSIS — E119 Type 2 diabetes mellitus without complications: Secondary | ICD-10-CM | POA: Diagnosis not present

## 2023-08-17 DIAGNOSIS — H52203 Unspecified astigmatism, bilateral: Secondary | ICD-10-CM | POA: Diagnosis not present

## 2023-08-17 LAB — HM DIABETES EYE EXAM

## 2023-08-23 ENCOUNTER — Telehealth: Payer: Self-pay | Admitting: Pharmacist

## 2023-08-23 NOTE — Telephone Encounter (Signed)
 Patient contacted for follow-up of diabetes control.  Phone was answered by patient's daughter, Gustav.  She shared many details including the patient's spouse has had a stroke and the patient is assisting at a high level with her care.  She also shared that the patient was check glucose recently.  She reports he has been trying to take his medicine but may need more support/help.  She is concerned that he may have depression. Willing to schedule follow-up with me in clinic this week.  Scheduled 7/9 at 11:00  Current Medications include: Tresiba    Reevaluate glucose control at upcoming visit and also motivation for change.   Total time with patient call and documentation of interaction: 11 minutes.

## 2023-08-24 NOTE — Telephone Encounter (Signed)
 Reviewed and agree with Dr Macky Lower plan.

## 2023-08-25 ENCOUNTER — Ambulatory Visit (INDEPENDENT_AMBULATORY_CARE_PROVIDER_SITE_OTHER): Admitting: Pharmacist

## 2023-08-25 ENCOUNTER — Encounter: Payer: Self-pay | Admitting: Pharmacist

## 2023-08-25 VITALS — BP 97/67 | HR 95 | Wt 243.0 lb

## 2023-08-25 DIAGNOSIS — E118 Type 2 diabetes mellitus with unspecified complications: Secondary | ICD-10-CM

## 2023-08-25 MED ORDER — TRULICITY 1.5 MG/0.5ML ~~LOC~~ SOAJ: 1.5000 mg | SUBCUTANEOUS | 2 refills | Status: DC

## 2023-08-25 MED ORDER — TRESIBA FLEXTOUCH 100 UNIT/ML ~~LOC~~ SOPN
50.0000 [IU] | PEN_INJECTOR | Freq: Every day | SUBCUTANEOUS | 1 refills | Status: DC
Start: 2023-08-25 — End: 2023-09-22

## 2023-08-25 MED ORDER — GLUCOSE BLOOD VI STRP
ORAL_STRIP | 12 refills | Status: DC
Start: 2023-08-25 — End: 2023-11-16

## 2023-08-25 MED ORDER — MOUNJARO 2.5 MG/0.5ML ~~LOC~~ SOAJ
2.5000 mg | SUBCUTANEOUS | Status: DC
Start: 2023-08-25 — End: 2023-09-22

## 2023-08-25 NOTE — Patient Instructions (Addendum)
 It was nice to see you today!   Your goal blood sugar is 80-130 before eating and less than 180 after eating.  Medication Changes: START Mounjaro  (tirzepatide )  2.5 mg once weekly with the sample.   RE-Start Tresiba  (insulin  degludec) 24 units today - Increase by 2 units daily until you reach 50 units per day OR if you have a glucose reading < 100   Continue all other medication the same.   Monitor blood sugars at home and keep a log (glucometer or piece of paper) to bring with you to your next visit.  Keep up the good work with diet and exercise. Aim for a diet full of vegetables, fruit and lean meats (chicken, [place], fish). Try to limit salt intake by eating fresh or frozen vegetables (instead of canned), rinse canned vegetables prior to cooking and do not add any additional salt to meals.   Pick-up GLUCOSE TEST STRIPS Insulin  AND Imdur  today at your pharmacy.

## 2023-08-25 NOTE — Assessment & Plan Note (Signed)
 Diabetes longstanding currently uncontrolled due to lack of injection therapy use. Patient is  able to verbalize appropriate hypoglycemia management plan. Medication adherence appears adherent.  - Restarted basal insulin  Tresiba  (insulin  degludec) 24 units plus 2 per day until 50 units daily (or fasting glucose < 100) - Due to high remaining deductible at pharmacy of ~ $400 we adjusted plan from Trulicity  (dulaglutide )  to Mounjaro  (tirzepatide ) 2.5mg  weekly with sample.  -Continued SGLT2-I  Farxiga  (dapagliflozin ) 10 mg daily. Counseled on sick day rules. -Extensively discussed pathophysiology of diabetes, recommended lifestyle interventions, dietary effects on blood sugar control.  -Counseled on s/sx of and management of hypoglycemia.

## 2023-08-25 NOTE — Progress Notes (Signed)
 S:     Chief Complaint  Patient presents with   Medication Management    Diabetes   68 y.o. male who presents for diabetes evaluation, education, and management. Patient arrives in fair spirits and presents without any assistance, however reports he has been challenged by the increase in workload of caring for his wife who had a stroke a little while ago.  Patient is accompanied by his daughter, Curtistine who joins by phone during the visit.     Patient was referred and last seen by Primary Care Provider, Dr. Rosendo, on 07/26/2023.    PMH is significant for Diabetes, Hypertension and Hyperlipidemia.  At last visit, we discussed medication adherence to all pills and need to be more consistent with Tresiba  (insulin  degludec) and Trulicity  (dulaglutide ) use.   Current diabetes medications include: Tresiba  (insulin  degludec) not taking Farxiga  (dapagliflozin ) 10 mg daily and Trulicity  (dulaglutide ) not taking Current hypertension medications include: Metoprolol  100 mg daily Current hyperlipidemia medications include: Atorvastatin  80 mg daily and ezetimibe  10 mg daily Reports pill box has improved adherence with pills.     Patient denies hypoglycemic events.   Reported home fasting blood sugars: Minimal checking - minimal nocturia while using injectable medications.    Patient reports missing injectable medications, unclear when he last took either injection therapy.  We discussed injection therapy is the key to managing his glucose.  Pills are unlikely enough.   Patient-reported exercise habits:  Still doing his own yard work.   O:   Review of Systems  Constitutional:  Positive for malaise/fatigue.  Musculoskeletal:  Positive for back pain and joint pain (fingers bilaterally).  Neurological:  Positive for tingling (burning). Negative for dizziness and headaches.  Psychiatric/Behavioral:  Positive for depression.   All other systems reviewed and are negative.   Physical  Exam Vitals reviewed.  Constitutional:      Appearance: Normal appearance.  Pulmonary:     Effort: Pulmonary effort is normal.  Neurological:     Mental Status: He is alert.  Psychiatric:        Behavior: Behavior normal.        Thought Content: Thought content normal.   Not currently checking glucose at home.   Lab Results  Component Value Date   HGBA1C 12.7 (A) 07/26/2023   Vitals:   08/25/23 1134  BP: 97/67  Pulse: 95  SpO2: 96%    Lipid Panel     Component Value Date/Time   CHOL 119 07/20/2022 1111   TRIG 103 07/20/2022 1111   HDL 35 (L) 07/20/2022 1111   CHOLHDL 3.4 07/20/2022 1111   CHOLHDL 6.6 12/24/2018 0214   VLDL 25 12/24/2018 0214   LDLCALC 65 07/20/2022 1111   LDLDIRECT 58 02/21/2021 1007    Clinical Atherosclerotic Cardiovascular Disease (ASCVD): Yes  The ASCVD Risk score (Arnett DK, et al., 2019) failed to calculate for the following reasons:   Risk score cannot be calculated because patient has a medical history suggesting prior/existing ASCVD    A/P: Diabetes longstanding currently uncontrolled due to lack of injection therapy use. Patient is  able to verbalize appropriate hypoglycemia management plan. Medication adherence appears adherent.  - Restarted basal insulin  Tresiba  (insulin  degludec) 24 units plus 2 per day until 50 units daily (or fasting glucose < 100) - Due to high remaining deductible at pharmacy of ~ $400 we adjusted plan from Trulicity  (dulaglutide )  to Mounjaro  (tirzepatide ) 2.5mg  weekly with sample.  -Continued SGLT2-I  Farxiga  (dapagliflozin ) 10 mg daily. Counseled  on sick day rules. -Extensively discussed pathophysiology of diabetes, recommended lifestyle interventions, dietary effects on blood sugar control.  -Counseled on s/sx of and management of hypoglycemia.     Hypertension- Blood pressure goal of <130/80 mmHg. Medication adherence reports adherence.  -Continued metoprolol  XL 100 mg daily.   Written patient instructions  provided. Patient verbalized understanding of treatment plan.  Total time in face to face counseling 39 minutes.    Follow-up:  Pharmacist 4 weeks PCP clinic visit in TBD

## 2023-08-27 NOTE — Progress Notes (Signed)
 Reviewed and agree with Dr Macky Lower plan.

## 2023-09-13 ENCOUNTER — Encounter: Payer: Self-pay | Admitting: Gastroenterology

## 2023-09-13 ENCOUNTER — Ambulatory Visit (INDEPENDENT_AMBULATORY_CARE_PROVIDER_SITE_OTHER): Admitting: Gastroenterology

## 2023-09-13 VITALS — BP 124/80 | HR 85 | Ht 68.0 in | Wt 239.0 lb

## 2023-09-13 DIAGNOSIS — Z1211 Encounter for screening for malignant neoplasm of colon: Secondary | ICD-10-CM | POA: Diagnosis not present

## 2023-09-13 DIAGNOSIS — K5900 Constipation, unspecified: Secondary | ICD-10-CM | POA: Diagnosis not present

## 2023-09-13 DIAGNOSIS — I2510 Atherosclerotic heart disease of native coronary artery without angina pectoris: Secondary | ICD-10-CM | POA: Diagnosis not present

## 2023-09-13 MED ORDER — NA SULFATE-K SULFATE-MG SULF 17.5-3.13-1.6 GM/177ML PO SOLN: 1.0000 | Freq: Once | ORAL | 0 refills | Status: AC

## 2023-09-13 MED ORDER — POLYETHYLENE GLYCOL 3350 17 G PO PACK: 17.0000 g | PACK | Freq: Every day | ORAL | Status: DC

## 2023-09-13 NOTE — Progress Notes (Signed)
 HPI :  68 year old male with a history of CAD/MI, diabetes, chronic constipation, here to discuss colon cancer screening, accompanied by his daughter.  He states he is here because his daughter forced him to.  He has never had a prior colonoscopy or colon cancer screening.  Daughter states he has had an uncle with colon cancer, otherwise no other family history of colon cancer noted.  He states he had some chronic constipation bothering him intermittently over time.  Does not use the bathroom daily.  Denies any blood in his stools.  Has no abdominal pains.  Has not really taken much for his bowels other than fiber/Metamucil which she states has not really helped.  He has not tried MiraLAX  before.  No prior tobacco use.  He follows with cardiology for history of CAD/MI.  He is currently not on any antiplatelet therapy other than a baby aspirin .  Takes Mounjaro  for diabetes/weight loss.  Last cardiac imaging was with a PET scan in 2023 which showed normal EF.  He denies any cardiopulmonary symptoms that bother him, he just saw cardiology a few months ago  He admits he does not want to have a colonoscopy but is here at the encouragement of his family.  He is willing to do this if needed.  We discussed his options as below He otherwise feels well without complaints today.  Last cardiac workup Cardiac PET 10/2021:   The study is normal. The study is low risk.   LV perfusion is normal. There is no evidence of ischemia. There is no evidence of infarction.   Rest left ventricular function is normal. Rest EF: 55 %. Stress left ventricular function is normal. Stress EF: 61 %. End diastolic cavity size is normal. End systolic cavity size is normal. No evidence of transient ischemic dilation (TID) noted.   Myocardial blood flow was computed to be 0.71ml/g/min at rest and 1.77ml/g/min at stress. Global myocardial blood flow reserve was 2.49 and was normal.   Coronary calcium  assessment not performed due to  prior revascularization.  Past Medical History:  Diagnosis Date   Arthritis    Back pain    CAD (coronary artery disease)    a. s/p NSTEMI in 12/2018 with 80% Prox-RCA stenosis followed by 100% prox to mid-RCA stenosis --> DESx2 to the RCA. Med management of residual disease.    Carpal tunnel syndrome    DDD (degenerative disc disease)    Diabetes mellitus    type 2   Diabetic retinopathy (HCC)    Hyperlipidemia    Hypertension    states very mild   Low testosterone     Myocardial infarction (HCC)    Overweight    Seasonal allergies    Tachycardia      Past Surgical History:  Procedure Laterality Date   CARDIAC CATHETERIZATION     CARPAL TUNNEL RELEASE  07/02/2011   Procedure: CARPAL TUNNEL RELEASE;  Surgeon: Lamar LULLA Leonor Mickey., MD;  Location: [facility];  Service: Orthopedics;  Laterality: Right;   CARPAL TUNNEL RELEASE  07/30/2011   Procedure: CARPAL TUNNEL RELEASE;  Surgeon: Lamar LULLA Leonor Mickey., MD;  Location: [facility];  Service: Orthopedics;  Laterality: Left;   CORONARY ANGIOPLASTY     CORONARY STENT INTERVENTION N/A 12/23/2018   Procedure: CORONARY STENT INTERVENTION;  Surgeon: Dann Candyce RAMAN, MD;  Location: [facility] INVASIVE CV LAB;  Service: Cardiovascular;  Laterality: N/A;   LEFT HEART CATH AND CORONARY ANGIOGRAPHY N/A 12/23/2018   Procedure: LEFT  HEART CATH AND CORONARY ANGIOGRAPHY;  Surgeon: Dann Candyce RAMAN, MD;  Location: [facility] INVASIVE CV LAB;  Service: Cardiovascular;  Laterality: N/A;   LUMBAR EPIDURAL INJECTION     x2   SHOULDER ARTHROSCOPY WITH OPEN ROTATOR CUFF REPAIR AND DISTAL CLAVICLE ACROMINECTOMY Right 06/03/2020   Procedure: right shoulder arthroscopy, biceps tenodesis, mini open rotator cuff tear repair subscap/supraspinatus, lateral debridement;  Surgeon: Addie Cordella Hamilton, MD;  Location: [facility] OR;  Service: Orthopedics;  Laterality: Right;   Family History  Problem Relation Age of Onset   Stroke Mother    CAD Father     CAD Maternal Grandfather    CAD Paternal Grandfather    Social History   Tobacco Use   Smoking status: Never   Smokeless tobacco: Never  Vaping Use   Vaping status: Never Used  Substance Use Topics   Alcohol use: No   Drug use: No   Current Outpatient Medications  Medication Sig Dispense Refill   Accu-Chek Softclix Lancets lancets Use as instructed. Once daily testing. 100 each 12   acetaminophen  (TYLENOL ) 325 MG tablet Take 2 tablets (650 mg total) by mouth every 6 (six) hours as needed.     aspirin  EC 81 MG EC tablet Take 1 tablet (81 mg total) by mouth daily.     atorvastatin  (LIPITOR ) 80 MG tablet TAKE 1 TABLET BY MOUTH EVERY DAY 90 tablet 3   Blood Glucose Monitoring Suppl (ACCU-CHEK GUIDE) w/Device KIT Use to monitor glucose daily. 1 kit 0   cetirizine  (ZYRTEC ) 10 MG tablet Take 1 tablet (10 mg total) by mouth daily. 90 tablet 11   dapagliflozin  propanediol (FARXIGA ) 10 MG TABS tablet Take 1 tablet (10 mg total) by mouth daily. 30 tablet 3   diclofenac  Sodium (VOLTAREN ) 1 % GEL Apply 2 g topically 4 (four) times daily. 100 g 3   ezetimibe  (ZETIA ) 10 MG tablet TAKE 1 TABLET BY MOUTH EVERY DAY 90 tablet 3   fluticasone  (FLONASE ) 50 MCG/ACT nasal spray Place 1 spray into both nostrils daily. 1 spray in each nostril every day 16 g 2   glucose blood test strip Use as instructed 100 each 12   insulin  degludec (TRESIBA  FLEXTOUCH) 100 UNIT/ML FlexTouch Pen Inject 50 Units into the skin daily. Inject 24 units daily. Increase by 2 units daily for a max dose of 50 units daily. 15 mL 1   isosorbide  mononitrate (IMDUR ) 30 MG 24 hr tablet Take 1 tablet (30 mg total) by mouth daily. 90 tablet 3   metoprolol  succinate (TOPROL -XL) 100 MG 24 hr tablet Take 1 tablet (100 mg total) by mouth at bedtime. Take with or immediately following a meal. 90 tablet 3   nitroGLYCERIN  (NITROSTAT ) 0.4 MG SL tablet Place 1 tablet (0.4 mg total) under the tongue every 5 (five) minutes x 3 doses as needed for  chest pain. 25 tablet 3   pregabalin  (LYRICA ) 75 MG capsule TAKE 1 CAPSULE BY MOUTH 3 TIMES DAILY AS NEEDED. 90 capsule 1   tirzepatide  (MOUNJARO ) 2.5 MG/0.5ML Pen Inject 2.5 mg into the skin once a week.     No current facility-administered medications for this visit.   Allergies  Allergen Reactions   Codeine Swelling    Lips and facial swelling     Review of Systems: All systems reviewed and negative except where noted in HPI.   Lab Results  Component Value Date   WBC 5.9 07/11/2021   HGB 15.2 07/11/2021   HCT 47.9 07/11/2021   MCV 87.6  07/11/2021   PLT 281 07/11/2021    Lab Results  Component Value Date   NA 138 12/17/2022   CL 102 12/17/2022   K 4.3 12/17/2022   CO2 19 (L) 12/17/2022   BUN 15 12/17/2022   CREATININE 1.01 12/17/2022   EGFR 82 12/17/2022   CALCIUM  10.0 12/17/2022   ALBUMIN  4.7 03/01/2019   GLUCOSE 328 (H) 12/17/2022    Lab Results  Component Value Date   ALT 23 03/01/2019   AST 18 03/01/2019   ALKPHOS 84 03/01/2019   BILITOT 0.8 03/01/2019     Physical Exam: BP 124/80   Pulse 85   Ht 5' 8 (1.727 m)   Wt 239 lb (108.4 kg)   BMI 36.34 kg/m  Constitutional: Pleasant,well-developed, male in no acute distress. HEENT: Normocephalic and atraumatic. Conjunctivae are normal. No scleral icterus. Neck supple.  Cardiovascular: Normal rate, regular rhythm.  Pulmonary/chest: Effort normal and breath sounds normal.  Abdominal: Soft, nondistended, nontender. There are no masses palpable. Extremities: no edema Lymphadenopathy: No cervical adenopathy noted. Neurological: Alert and oriented to person place and time. Skin: Skin is warm and dry. No rashes noted. Psychiatric: Normal mood and affect. Behavior is normal.   ASSESSMENT: 68 y.o. male here for assessment of the following  1. Colon cancer screening   2. Constipation, unspecified constipation type   3. Atherosclerosis of native coronary artery of native heart without angina pectoris     Some baseline constipation that has not improved with taking fiber supplementation.  Recommend trial of MiraLAX .  Recommend daily use and titrate up or down as tolerated/needed.  He understands and wants to try this.  We spent some time discussing his colon cancer screening options.  He has never had a prior exam.  He is overdue for screening by more than 20 years.  I suspect it is very likely and possible he has colon polyps.  Recommend optical colonoscopy if he is willing has preferred modality for colon cancer screening.  Discussed with this is, associated risks of the procedure and anesthesia.  Other options for screening would include FIT testing versus Cologuard etc.  We discussed these exams and limitations of them.  He would understand if this was positive he would need a colonoscopy.  After discussion of all options he is willing to proceed with colonoscopy.  History of CAD noted, he recently saw cardiology, asymptomatic, on baby aspirin .  PLAN: - discussed options for CRC screening - schedule colonoscopy at the [facility], will need to hold Mounjaro  prior to the exam - start Miralax  daily and titrate as needed for constipation  Marcey Naval, MD [facility]  CC: Madelon Donald HERO, DO

## 2023-09-13 NOTE — Patient Instructions (Addendum)
 You have been scheduled for a colonoscopy. Please follow written instructions given to you at your visit today.   If you use inhalers (even only as needed), please bring them with you on the day of your procedure.  DO NOT TAKE 7 DAYS PRIOR TO TEST- Trulicity  (dulaglutide ) Ozempic, Wegovy (semaglutide) Mounjaro  (tirzepatide ) Bydureon Bcise (exanatide extended release)  DO NOT TAKE 1 DAY PRIOR TO YOUR TEST Rybelsus (semaglutide) Adlyxin (lixisenatide) Victoza (liraglutide) Byetta (exanatide) ___________________________________________________________________________  Please purchase the following medications over the counter and take as directed: Miralax - Take once daily and titrate as needed   Thank you for entrusting me with your care and for choosing [facility], Dr. Elspeth Naval    If your blood pressure at your visit was 140/90 or greater, please contact your primary care physician to follow up on this. ______________________________________________________  If you are age 64 or older, your body mass index should be between 23-30. Your Body mass index is 36.34 kg/m. If this is out of the aforementioned range listed, please consider follow up with your Primary Care Provider.  If you are age 36 or younger, your body mass index should be between 19-25. Your Body mass index is 36.34 kg/m. If this is out of the aformentioned range listed, please consider follow up with your Primary Care Provider.  ________________________________________________________  The [facility] providers would like to encourage you to use MYCHART to communicate with providers for non-urgent requests or questions.  Due to long hold times on the telephone, sending your provider a message by [facility] may be a faster and more efficient way to get a response.  Please allow 48 business hours for a response.  Please remember that this is for non-urgent requests.   _______________________________________________________  Due to recent changes in healthcare laws, you may see the results of your imaging and laboratory studies on MyChart before your provider has had a chance to review them.  We understand that in some cases there may be results that are confusing or concerning to you. Not all laboratory results come back in the same time frame and the provider may be waiting for multiple results in order to interpret others.  Please give us  48 hours in order for your provider to thoroughly review all the results before contacting the office for clarification of your results.

## 2023-09-22 ENCOUNTER — Ambulatory Visit: Admitting: Pharmacist

## 2023-09-22 ENCOUNTER — Encounter: Payer: Self-pay | Admitting: Pharmacist

## 2023-09-22 VITALS — BP 150/88 | HR 89 | Wt 241.6 lb

## 2023-09-22 DIAGNOSIS — E1142 Type 2 diabetes mellitus with diabetic polyneuropathy: Secondary | ICD-10-CM

## 2023-09-22 DIAGNOSIS — E118 Type 2 diabetes mellitus with unspecified complications: Secondary | ICD-10-CM

## 2023-09-22 DIAGNOSIS — J302 Other seasonal allergic rhinitis: Secondary | ICD-10-CM

## 2023-09-22 DIAGNOSIS — E1159 Type 2 diabetes mellitus with other circulatory complications: Secondary | ICD-10-CM | POA: Diagnosis not present

## 2023-09-22 DIAGNOSIS — I152 Hypertension secondary to endocrine disorders: Secondary | ICD-10-CM | POA: Diagnosis not present

## 2023-09-22 DIAGNOSIS — G5601 Carpal tunnel syndrome, right upper limb: Secondary | ICD-10-CM

## 2023-09-22 MED ORDER — NITROGLYCERIN 0.4 MG SL SUBL: 0.4000 mg | SUBLINGUAL_TABLET | SUBLINGUAL | 3 refills | Status: AC | PRN

## 2023-09-22 MED ORDER — INSULIN PEN NEEDLE 32G X 4 MM MISC: 1.0000 | Freq: Two times a day (BID) | 11 refills | Status: DC

## 2023-09-22 MED ORDER — MOUNJARO 2.5 MG/0.5ML ~~LOC~~ SOAJ: 2.5000 mg | SUBCUTANEOUS | 0 refills | Status: DC

## 2023-09-22 MED ORDER — TRESIBA FLEXTOUCH 100 UNIT/ML ~~LOC~~ SOPN: 50.0000 [IU] | PEN_INJECTOR | Freq: Every day | SUBCUTANEOUS | 1 refills | Status: DC

## 2023-09-22 MED ORDER — CETIRIZINE HCL 10 MG PO TABS: 10.0000 mg | ORAL_TABLET | Freq: Every day | ORAL | 11 refills | Status: DC

## 2023-09-22 MED ORDER — DICLOFENAC SODIUM 1 % EX GEL: 2.0000 g | Freq: Four times a day (QID) | CUTANEOUS | 3 refills | Status: DC

## 2023-09-22 MED ORDER — FLUTICASONE PROPIONATE 50 MCG/ACT NA SUSP: 1.0000 | Freq: Every day | NASAL | 2 refills | Status: DC

## 2023-09-22 NOTE — Assessment & Plan Note (Signed)
 Diabetes longstanding with greatly improved glucose control based on home glucometer readings seen today in office.  Average glucose of 158 is greatly improved.  He reports adherence with injection therapy  use. Injection medication adherent.  - Continued insulin  Tresiba  (insulin  degludec) 50 units daily  - Continued Mounjaro  (tirzepatide ) 2.5mg  weekly with sample.  - Continued SGLT2-I  Farxiga  (dapagliflozin ) 10 mg daily.  -Extensively discussed pathophysiology of diabetes, recommended lifestyle interventions, dietary effects on blood sugar control.  -Counseled on s/sx of and management of hypoglycemia.

## 2023-09-22 NOTE — Patient Instructions (Addendum)
 It was nice to see you today! Keep up the good work on taking your medications consistently, your average glucose is great.   Your goal blood sugar is 80-130 before eating and less than 180 after eating.  Medication Changes: Hold Mounjaro  (tirzepatide ) for colonoscopy. Take your last dose on 10/16/23, then you can restart your normal weekly schedule on 10/30/23.  Increase Miralax  to twice daily. Goal 1-2 stools per day. If not at goal, consider trying Dulcolax (bisacodyl) once daily. If still not at goal, consider trying Milk of Magnesia. All of these medications can be found over the counter with directions for use on the bottle.  Continue all other medication the same.  Monitor blood sugars at home and keep a log (glucometer or piece of paper) to bring with you to your next visit. Keep up the good work checking your glucose twice daily!  Keep up the good work with diet and exercise. Aim for a diet full of vegetables, fruit and lean meats (chicken, [place], fish). Try to limit salt intake by eating fresh or frozen vegetables (instead of canned), rinse canned vegetables prior to cooking and do not add any additional salt to meals.

## 2023-09-22 NOTE — Assessment & Plan Note (Signed)
 Hypertension- elevated today, most likely due to not taking oral meds this AM.  Blood pressure goal of <130/80 mmHg.  - Continued metoprolol  XL 100 mg daily. - Encouraged to take medications daily.

## 2023-09-22 NOTE — Progress Notes (Signed)
 S:     Chief Complaint  Patient presents with   Medication Management    DM f/u   68 y.o. male who presents for diabetes evaluation, education, and management. Patient arrives in good spirits and presents without any assistance.  Patient was referred and last seen by Primary Care Provider, Dr. Rosendo 07/26/2023.  At last visit, with me on 08/25/23, patient was not taking injection medications and diabetes control was poor. We discussed medication adherence to all pills and need to be more consistent with injection therapy and need to take his Tresiba  (insulin  degludec).  We also initiated Mounjaro  (tirzepatide ) using sample.     PMH is significant for Diabetes, Hypertension and Hyperlipidemia.   Current diabetes medications include: Tresiba  (insulin  degludec) 50 units daily, not taking Farxiga  (dapagliflozin ) 10 mg daily and Mounjaro  (tirzepatide ) 2.5 mg weekly on Saturday. Current hypertension medications include: Metoprolol  100 mg daily Current hyperlipidemia medications include: Atorvastatin  80 mg daily and ezetimibe  10 mg daily Reports pill box has improved adherence with pills.     Patient denies hypoglycemic events.   Brought meter today and shared home fasting blood sugars: IMPROVED! - readings in the 100s consistently including many readings < 150 fasting.  No fasting readings < 100.     Patient admits he did not take any of his pills or injections THIS morning prior to our visit.   Do you feel that your medications are working for you? yes Have you been experiencing any side effects to the medications prescribed? no  Patient reports neuropathy (nerve pain). Requests dose increase of Pregabalin . Taking 75 TID Patient reports visual changes. No change since last visit.  O:   Review of Systems  Neurological:  Positive for tingling and sensory change.    Physical Exam Constitutional:      Appearance: Normal appearance.  Pulmonary:     Effort: Pulmonary effort is  normal.  Neurological:     Mental Status: He is alert.  Psychiatric:        Mood and Affect: Mood normal.        Behavior: Behavior normal.        Thought Content: Thought content normal.        Judgment: Judgment normal.     7 day average blood glucose: 158 (checking twice daily)  Lab Results  Component Value Date   HGBA1C 12.7 (A) 07/26/2023   Vitals:   09/22/23 1047 09/22/23 1051  BP: (!) 151/94 (!) 150/88  Pulse: 88 89  SpO2: 99% 99%    Lipid Panel     Component Value Date/Time   CHOL 119 07/20/2022 1111   TRIG 103 07/20/2022 1111   HDL 35 (L) 07/20/2022 1111   CHOLHDL 3.4 07/20/2022 1111   CHOLHDL 6.6 12/24/2018 0214   VLDL 25 12/24/2018 0214   LDLCALC 65 07/20/2022 1111   LDLDIRECT 58 02/21/2021 1007    Clinical Atherosclerotic Cardiovascular Disease (ASCVD): Yes  The ASCVD Risk score (Arnett DK, et al., 2019) failed to calculate for the following reasons:   Risk score cannot be calculated because patient has a medical history suggesting prior/existing ASCVD    A/P: Diabetes longstanding with greatly improved glucose control based on home glucometer readings seen today in office.  Average glucose of 158 is greatly improved.  He reports adherence with injection therapy  use. Injection medication adherent.  - Continued insulin  Tresiba  (insulin  degludec) 50 units daily  - Continued Mounjaro  (tirzepatide ) 2.5mg  weekly with sample.  - Continued SGLT2-I  Farxiga  (dapagliflozin ) 10 mg daily.  -Extensively discussed pathophysiology of diabetes, recommended lifestyle interventions, dietary effects on blood sugar control.  -Counseled on s/sx of and management of hypoglycemia.     Hypertension- elevated today, most likely due to not taking oral meds this AM.  Blood pressure goal of <130/80 mmHg.  - Continued metoprolol  XL 100 mg daily. - Encouraged to take medications daily.   Discussed plan of medication use prior to colonoscopy - 10/28/2023  Requested refill of  pregabalin  and also requested dose increase from 75mg  TID.  - Routed to PCP, Dr. Toma for consideration.   Written patient instructions provided. Patient verbalized understanding of treatment plan.  Total time in face to face counseling 47 minutes.    Follow-up:  Pharmacist 10/26/2023 PCP clinic visit in TBD Patient seen with Fonda Blase, PharmD Candidate - [employer]Y3 student and Calton Nash, PharmD Candidate - [employer]4 student.

## 2023-09-22 NOTE — Addendum Note (Signed)
 Addended by: Karolyne Timmons G on: 09/22/2023 12:58 PM   Modules accepted: Orders

## 2023-09-23 ENCOUNTER — Other Ambulatory Visit: Payer: Self-pay | Admitting: Family Medicine

## 2023-09-23 DIAGNOSIS — E1142 Type 2 diabetes mellitus with diabetic polyneuropathy: Secondary | ICD-10-CM

## 2023-09-23 MED ORDER — PREGABALIN 100 MG PO CAPS: ORAL_CAPSULE | ORAL | 0 refills | Status: DC

## 2023-09-23 NOTE — Progress Notes (Signed)
 Refilling Lyrica , increasing dose from 75 mg TID PRN to 100 mg TID PRN per message from Dr. Koval after visit 09/22/2023.

## 2023-09-23 NOTE — Progress Notes (Signed)
 Reviewed and agree with Dr Macky Lower plan.

## 2023-10-26 ENCOUNTER — Ambulatory Visit: Admitting: Pharmacist

## 2023-10-26 ENCOUNTER — Ambulatory Visit (INDEPENDENT_AMBULATORY_CARE_PROVIDER_SITE_OTHER): Admitting: Student

## 2023-10-26 ENCOUNTER — Encounter: Payer: Self-pay | Admitting: Pharmacist

## 2023-10-26 VITALS — BP 126/88 | HR 92 | Wt 237.2 lb

## 2023-10-26 VITALS — BP 126/88 | HR 92 | Ht 68.0 in | Wt 237.2 lb

## 2023-10-26 DIAGNOSIS — J302 Other seasonal allergic rhinitis: Secondary | ICD-10-CM | POA: Diagnosis not present

## 2023-10-26 DIAGNOSIS — E1142 Type 2 diabetes mellitus with diabetic polyneuropathy: Secondary | ICD-10-CM

## 2023-10-26 DIAGNOSIS — E118 Type 2 diabetes mellitus with unspecified complications: Secondary | ICD-10-CM

## 2023-10-26 DIAGNOSIS — I152 Hypertension secondary to endocrine disorders: Secondary | ICD-10-CM

## 2023-10-26 DIAGNOSIS — E785 Hyperlipidemia, unspecified: Secondary | ICD-10-CM

## 2023-10-26 DIAGNOSIS — E1169 Type 2 diabetes mellitus with other specified complication: Secondary | ICD-10-CM

## 2023-10-26 DIAGNOSIS — E1159 Type 2 diabetes mellitus with other circulatory complications: Secondary | ICD-10-CM | POA: Diagnosis not present

## 2023-10-26 LAB — POCT GLYCOSYLATED HEMOGLOBIN (HGB A1C): HbA1c, POC (controlled diabetic range): 10.3 % — AB (ref 0.0–7.0)

## 2023-10-26 MED ORDER — MOUNJARO 2.5 MG/0.5ML ~~LOC~~ SOAJ: 2.5000 mg | SUBCUTANEOUS | Status: DC

## 2023-10-26 MED ORDER — TRESIBA FLEXTOUCH 100 UNIT/ML ~~LOC~~ SOPN
60.0000 [IU] | PEN_INJECTOR | Freq: Every day | SUBCUTANEOUS | 1 refills | Status: AC
Start: 2023-10-26 — End: ?

## 2023-10-26 MED ORDER — CETIRIZINE HCL 10 MG PO TABS: 10.0000 mg | ORAL_TABLET | Freq: Every day | ORAL | 11 refills | Status: DC

## 2023-10-26 NOTE — Patient Instructions (Addendum)
 It was nice to see you today! Wishing you well for your procedure! We'll follow up with your results  Medication Changes: Take Pregabalin  100 mg three times daily and take 2 tablets of the acetaminophen  650 mg three time daily  Adjust Tresiba  to 30 units tomorrow and no insulin  on morning of colonoscopy (Thursday). On Friday start taking 60 units of the Tresiba    Restart Mounjaro  (tirzepatide ) 2.5 mg on Friday with sample  Restart all other medications on Friday as well   Monitor blood sugars at home and keep a log (glucometer or piece of paper) to bring with you to your next visit.  For tomorrow try to stay hydrated with clear liquids like water and gatorade zero. Eat light soups and jello to stay light on your stomach. Can have small amounts of bread and eggs for breakfast if you want.

## 2023-10-26 NOTE — Progress Notes (Signed)
 S:     Chief Complaint  Patient presents with   Medication Management    Dm follow up   68 y.o. male who presents for diabetes evaluation, education, and management. Patient arrives in fair spirits and presents without any assistance. Daughter Gustav was on phone during most of the visit. Visit primarily focused on helping prepare home regimen around Colonoscopy on 10/28/23  Patient was referred and last seen by Primary Care Provider, Dr. Rosendo, on 07/26/23.  At last visit with me on 09/22/23, encouraged better adherence to oral meds.  PMH is significant for Diabetes, hypertension, hyperlipidemia .   Current diabetes medications include: Tresiba  (insulin  degludec) 50 units daily, Mounjaro  (tirzepatide ) 2.5 mg weekly on Saturday, not taking Farxiga  (dapagliflozin )  Current hypertension medications include: Metoprolol  100 mg daily Current hyperlipidemia medications include: Atorvastatin  80 mg daily and ezetimibe  10 mg daily  Patient denies adherence with medications, reports missing oral medications 2-3 weeks.  Do you feel that your medications are working for you? yes Have you been experiencing any side effects to the medications prescribed? no  Patient denies hypoglycemic events.  Reported home fasting blood sugars: 287 and 288   Patient reports nocturia (nighttime urination). 4x a night (drinking water to prepare for colonoscopy Patient reports neuropathy (nerve pain). Patient reports self foot exams. (Big toe has some swelling and some peeling) soaked in epsom salt and rubbed with alcohol for relief. Dr. Rosendo assisted in a foot exam. Numbness in first 3 toes on right foot.  Patient reported dietary habits:  Breakfast: bacon eggs and toast  Patient-reported Arthritis in left thumb, catching when trying to bend  Daughter requested CMET and any other necessary labs be done today as he has not had blood drawn in almost a year.  O:   Review of Systems   Musculoskeletal:  Positive for joint pain.  Neurological:  Positive for tingling.    Physical Exam Constitutional:      Appearance: Normal appearance.  Pulmonary:     Effort: Pulmonary effort is normal.  Neurological:     Mental Status: He is alert.  Psychiatric:        Mood and Affect: Mood normal.        Behavior: Behavior normal.        Thought Content: Thought content normal.    Lab Results  Component Value Date   HGBA1C 10.3 (A) 10/26/2023   Vitals:   10/26/23 1038  BP: 126/88  Pulse: 92    Lipid Panel     Component Value Date/Time   CHOL 119 07/20/2022 1111   TRIG 103 07/20/2022 1111   HDL 35 (L) 07/20/2022 1111   CHOLHDL 3.4 07/20/2022 1111   CHOLHDL 6.6 12/24/2018 0214   VLDL 25 12/24/2018 0214   LDLCALC 65 07/20/2022 1111   LDLDIRECT 58 02/21/2021 1007    Clinical Atherosclerotic Cardiovascular Disease (ASCVD): Yes  The ASCVD Risk score (Arnett DK, et al., 2019) failed to calculate for the following reasons:   Risk score cannot be calculated because patient has a medical history suggesting prior/existing ASCVD   Patient is participating in a Managed Medicaid Plan:  Yes   A/P: Diabetes longstanding currently uncontrolled. Patient is able to verbalize appropriate hypoglycemia management plan. Medication adherence appears poor. Control is suboptimal due to sedentary lifestyle and dietary indiscretion. -Adjusted dose of Tresiba  (insulin  degludec) from 50 units to 30 units in the morning tomorrow and Thursday. He was educated to not take insulin  on morning of Colonoscopy.  Patient will restart Tresiba  (insulin  degludec) at 60 units daily starting Friday.  -Continued GLP-1 Mounjaro  (tirzepatide ) 2.5 mg weekly on Fridays with samples.  -Restarted SGLT2-I Farxiga  (dapagliflozin ) 10 mg and told to begin again on Friday. Counseled on sick day rules. -Ordered A1c -Ordered Uric Acid level -Patient educated on purpose, proper use, and potential adverse effects.   -Extensively discussed pathophysiology of diabetes, recommended lifestyle interventions, dietary effects on blood sugar control.  -Counseled on s/sx of and management of hypoglycemia.    ASCVD risk -secondary prevention in patient with diabetes. Last LDL is 58 at goal of <70 mg/dL. High intensity statin indicated.  -Restarted Atorvastatin  80 mg.  -Ordered Lipid Panel  Hypertension longstanding currently elevated likely due to anxiousness over Colonoscopy. Blood pressure goal of <130/80 mmHg. Medication adherence poor. Blood pressure control is suboptimal due to medication adherence. -Restarted Isosorbide  mononitrate 30 mg daily -Restarted metoprolol  100 mg daily -Ordered CMET  Written patient instructions provided. Patient verbalized understanding of treatment plan.  Total time in face to face counseling 67 minutes.    Follow-up:  Pharmacist 11/16/23 PCP clinic visit with Dr. Rosendo in 10/26/23 Patient seen with Dr. Rosendo COME, Fonda Blase, PharmD Candidate - [employer]3 student and Estefana Blase, PharmD Candidate - [employer] student.

## 2023-10-26 NOTE — Progress Notes (Signed)
    SUBJECTIVE:   CHIEF COMPLAINT / HPI:   68 year old male history of T2DM and HTN Seen today for pain in first 3 right toe Patient describes pain as pins and needles Endorses history of neuropathgy but has improved compared to last year No recent trauma and pain has been worse in the last 3 weeks Home blood sugar has been above 250s during this time Currently on pregabalin  and 100 mg 3 times daily  PERTINENT  PMH / PSH: Reviewed   OBJECTIVE:   BP 126/88   Pulse 92   Ht 5' 8 (1.727 m)   Wt 237 lb 3.2 oz (107.6 kg)   SpO2 100%   BMI 36.07 kg/m    Physical Exam General: Alert, well appearing, NAD Cardiovascular: RRR, No Murmurs, Normal S2/S2 Respiratory: CTAB, No wheezing or Rales Abdomen: No distension or tenderness Extremities:  Foot exam surves normal pulse on all LE. Diminhsed sensation   ASSESSMENT/PLAN:   Diabetic neuropathy (HCC) Pain describes as pins and needle in the right first week, was more consistent with diabetic neuropathy.  Poorly controlled diabetes with A1c of 10.3 although improving from few months ago.  Currently on really Lyrica  100 mg 3 times daily.  Patient endorses slight improvement in neuropathy compared to last year. Does have diminished sensation in the right first 3 toes on exam but good pedal pulses bilaterally.  Via shared decision patient agrees to work on tighter blood glucose control and remain on current lyrica  regime.       Norleen April, MD [facility]

## 2023-10-26 NOTE — Assessment & Plan Note (Addendum)
 Diabetes longstanding currently uncontrolled. Patient is able to verbalize appropriate hypoglycemia management plan. Medication adherence appears poor. Control is suboptimal due to sedentary lifestyle and dietary indiscretion. -Adjusted dose of Tresiba  (insulin  degludec) from 50 units to 30 units in the morning tomorrow and Thursday. He was educated to not take insulin  on morning of Colonoscopy. Patient will restart Tresiba  (insulin  degludec) at 60 units daily starting Friday.  -Continued GLP-1 Mounjaro  (tirzepatide ) 2.5 mg weekly on Fridays with samples.  -Restarted SGLT2-I Farxiga  (dapagliflozin ) 10 mg and told to begin again on Friday. Counseled on sick day rules. -Ordered A1c -Ordered Uric Acid level

## 2023-10-26 NOTE — Assessment & Plan Note (Signed)
 Pain describes as pins and needle in the right first week, was more consistent with diabetic neuropathy.  Poorly controlled diabetes with A1c of 10.3 although improving from few months ago.  Currently on really Lyrica  100 mg 3 times daily.  Patient endorses slight improvement in neuropathy compared to last year. Does have diminished sensation in the right first 3 toes on exam but good pedal pulses bilaterally.  Via shared decision patient agrees to work on tighter blood glucose control and remain on current lyrica  regime.

## 2023-10-26 NOTE — Assessment & Plan Note (Signed)
 Hypertension longstanding currently elevated likely due to anxiousness over Colonoscopy. Blood pressure goal of <130/80 mmHg. Medication adherence poor. Blood pressure control is suboptimal due to medication adherence. -Restarted Isosorbide  mononitrate 30 mg daily -Restarted metoprolol  100 mg daily -Ordered CMET

## 2023-10-26 NOTE — Assessment & Plan Note (Signed)
 ASCVD risk -secondary prevention in patient with diabetes. Last LDL is 58 at goal of <70 mg/dL. High intensity statin indicated.  -Restarted Atorvastatin  80 mg.  -Ordered Lipid Panel

## 2023-10-27 ENCOUNTER — Telehealth: Payer: Self-pay | Admitting: Pharmacist

## 2023-10-27 LAB — COMPREHENSIVE METABOLIC PANEL WITH GFR
ALT: 26 IU/L (ref 0–44)
AST: 18 IU/L (ref 0–40)
Albumin: 4.3 g/dL (ref 3.9–4.9)
Alkaline Phosphatase: 90 IU/L (ref 44–121)
BUN/Creatinine Ratio: 16 (ref 10–24)
BUN: 16 mg/dL (ref 8–27)
Bilirubin Total: 0.8 mg/dL (ref 0.0–1.2)
CO2: 21 mmol/L (ref 20–29)
Calcium: 10 mg/dL (ref 8.6–10.2)
Chloride: 100 mmol/L (ref 96–106)
Creatinine, Ser: 1.03 mg/dL (ref 0.76–1.27)
Globulin, Total: 3.1 g/dL (ref 1.5–4.5)
Glucose: 259 mg/dL — ABNORMAL HIGH (ref 70–99)
Potassium: 4.5 mmol/L (ref 3.5–5.2)
Sodium: 136 mmol/L (ref 134–144)
Total Protein: 7.4 g/dL (ref 6.0–8.5)
eGFR: 80 mL/min/1.73 (ref >59)

## 2023-10-27 LAB — LIPID PANEL
Chol/HDL Ratio: 6.7 ratio — ABNORMAL HIGH (ref 0.0–5.0)
Cholesterol, Total: 256 mg/dL — ABNORMAL HIGH (ref 100–199)
HDL: 38 mg/dL — ABNORMAL LOW (ref >39)
LDL Chol Calc (NIH): 183 mg/dL — ABNORMAL HIGH (ref 0–99)
Triglycerides: 187 mg/dL — ABNORMAL HIGH (ref 0–149)
VLDL Cholesterol Cal: 35 mg/dL (ref 5–40)

## 2023-10-27 LAB — URIC ACID: Uric Acid: 6.6 mg/dL (ref 3.8–8.4)

## 2023-10-27 NOTE — Telephone Encounter (Signed)
 Patient contacted for follow-up of recent labs  Phone number in chart is patient's daughter Updated patient's daughter on results of recent labs and clarified regimen surrounding tomorrow's procedure. Emphasized he needs to be adherent to medications to get his lipids back under control. A1c has improved from 12.7 to 10.3.  Total time with patient call and documentation of interaction: 5 minutes.

## 2023-10-27 NOTE — Telephone Encounter (Signed)
 Reviewed and agree with Dr Rennis plan.

## 2023-10-28 ENCOUNTER — Ambulatory Visit: Admitting: Gastroenterology

## 2023-10-28 ENCOUNTER — Encounter: Payer: Self-pay | Admitting: Gastroenterology

## 2023-10-28 ENCOUNTER — Encounter (HOSPITAL_COMMUNITY): Payer: Self-pay | Admitting: Family Medicine

## 2023-10-28 VITALS — BP 107/70 | HR 82 | Temp 97.7°F | Resp 10 | Ht 68.0 in | Wt 239.0 lb

## 2023-10-28 DIAGNOSIS — K648 Other hemorrhoids: Secondary | ICD-10-CM | POA: Diagnosis not present

## 2023-10-28 DIAGNOSIS — K635 Polyp of colon: Secondary | ICD-10-CM | POA: Diagnosis not present

## 2023-10-28 DIAGNOSIS — Z1211 Encounter for screening for malignant neoplasm of colon: Secondary | ICD-10-CM | POA: Diagnosis not present

## 2023-10-28 DIAGNOSIS — I2510 Atherosclerotic heart disease of native coronary artery without angina pectoris: Secondary | ICD-10-CM | POA: Diagnosis not present

## 2023-10-28 DIAGNOSIS — E663 Overweight: Secondary | ICD-10-CM | POA: Diagnosis not present

## 2023-10-28 DIAGNOSIS — E119 Type 2 diabetes mellitus without complications: Secondary | ICD-10-CM | POA: Diagnosis not present

## 2023-10-28 DIAGNOSIS — K5730 Diverticulosis of large intestine without perforation or abscess without bleeding: Secondary | ICD-10-CM

## 2023-10-28 DIAGNOSIS — D125 Benign neoplasm of sigmoid colon: Secondary | ICD-10-CM | POA: Diagnosis not present

## 2023-10-28 DIAGNOSIS — D127 Benign neoplasm of rectosigmoid junction: Secondary | ICD-10-CM

## 2023-10-28 DIAGNOSIS — D124 Benign neoplasm of descending colon: Secondary | ICD-10-CM

## 2023-10-28 DIAGNOSIS — E785 Hyperlipidemia, unspecified: Secondary | ICD-10-CM | POA: Diagnosis not present

## 2023-10-28 DIAGNOSIS — K5790 Diverticulosis of intestine, part unspecified, without perforation or abscess without bleeding: Secondary | ICD-10-CM | POA: Insufficient documentation

## 2023-10-28 DIAGNOSIS — I10 Essential (primary) hypertension: Secondary | ICD-10-CM | POA: Diagnosis not present

## 2023-10-28 MED ORDER — SODIUM CHLORIDE 0.9 % IV SOLN: 500.0000 mL | Freq: Once | INTRAVENOUS | Status: DC

## 2023-10-28 NOTE — Progress Notes (Signed)
 Transferred to PACU via stretcher, arousing, VSS.

## 2023-10-28 NOTE — Progress Notes (Signed)
 Called to room to assist during endoscopic procedure.  Patient ID and intended procedure confirmed with present staff. Received instructions for my participation in the procedure from the performing physician.

## 2023-10-28 NOTE — Patient Instructions (Signed)

## 2023-10-28 NOTE — Op Note (Signed)
 [facility] Patient Name: Keith Newton Procedure Date: 10/28/2023 9:54 AM MRN: [id number] Endoscopist: Elspeth P. Leigh , MD, [id number] Age: 68 Referring MD:  Date of Birth: May 03, 1955 Gender: Male Account #: [id number] Procedure:                Colonoscopy Indications:              Screening for colorectal malignant neoplasm, This                            is the patient's first colonoscopy Medicines:                Monitored Anesthesia Care Procedure:                Pre-Anesthesia Assessment:                           - Prior to the procedure, a History and Physical                            was performed, and patient medications and                            allergies were reviewed. The patient's tolerance of                            previous anesthesia was also reviewed. The risks                            and benefits of the procedure and the sedation                            options and risks were discussed with the patient.                            All questions were answered, and informed consent                            was obtained. Prior Anticoagulants: The patient has                            taken no anticoagulant or antiplatelet agents. ASA                            Grade Assessment: III - A patient with severe                            systemic disease. After reviewing the risks and                            benefits, the patient was deemed in satisfactory                            condition to undergo the procedure.  After obtaining informed consent, the colonoscope                            was passed under direct vision. Throughout the                            procedure, the patient's blood pressure, pulse, and                            oxygen saturations were monitored continuously. The                            Olympus CF-HQ190L ([id number]) Colonoscope was                            introduced through  the anus and advanced to the the                            cecum, identified by appendiceal orifice and                            ileocecal valve. The colonoscopy was performed                            without difficulty. The patient tolerated the                            procedure well. The quality of the bowel                            preparation was good. The ileocecal valve,                            appendiceal orifice, and rectum were photographed. Scope In: 9:58:54 AM Scope Out: 10:24:19 AM Scope Withdrawal Time: 0 hours 21 minutes 3 seconds  Total Procedure Duration: 0 hours 25 minutes 25 seconds  Findings:                 The perianal and digital rectal examinations were                            normal.                           Multiple diverticula were found in the left colon                            and right colon.                           A 3 mm polyp was found in the descending colon. The                            polyp was sessile. The polyp was removed with a  cold snare. Resection and retrieval were complete.                           A 3 mm polyp was found in the sigmoid colon. The                            polyp was sessile. The polyp was removed with a                            cold snare. Resection and retrieval were complete.                           Two sessile polyps were found in the recto-sigmoid                            colon. The polyps were 3 to 4 mm in size. These                            polyps were removed with a cold snare. Resection                            and retrieval were complete.                           Internal hemorrhoids were found during                            retroflexion. The hemorrhoids were small.                           The exam was otherwise without abnormality. Complications:            No immediate complications. Estimated blood loss:                             Minimal. Estimated Blood Loss:     Estimated blood loss was minimal. Impression:               - Diverticulosis in the left colon and in the right                            colon.                           - One 3 mm polyp in the descending colon, removed                            with a cold snare. Resected and retrieved.                           - One 3 mm polyp in the sigmoid colon, removed with                            a cold snare. Resected and retrieved.                           -  Two 3 to 4 mm polyps at the recto-sigmoid colon,                            removed with a cold snare. Resected and retrieved.                           - Internal hemorrhoids.                           - The examination was otherwise normal. Recommendation:           - Patient has a contact number available for                            emergencies. The signs and symptoms of potential                            delayed complications were discussed with the                            patient. Return to normal activities tomorrow.                            Written discharge instructions were provided to the                            patient.                           - Resume previous diet.                           - Continue present medications.                           - Await pathology results. Elspeth P. Alee Gressman, MD 10/28/2023 10:30:41 AM This report has been signed electronically.

## 2023-10-28 NOTE — Progress Notes (Signed)
  Gastroenterology History and Physical   Primary Care Physician:  Toma Matas, MD   Reason for Procedure:   Colon cancer screening  Plan:    colonoscopy     HPI: Keith Newton is a 68 y.o. male  here for colonoscopy screening - first time exam. Patient denies any bowel symptoms at this time other than chronic constipation. Uncle had colon cancer, no other family members with it. . Otherwise feels well without any cardiopulmonary symptoms.   I have discussed risks / benefits of anesthesia and endoscopic procedure with Derrill Richter and they wish to proceed with the exams as outlined today.    Past Medical History:  Diagnosis Date   Arthritis    Back pain    CAD (coronary artery disease)    a. s/p NSTEMI in 12/2018 with 80% Prox-RCA stenosis followed by 100% prox to mid-RCA stenosis --> DESx2 to the RCA. Med management of residual disease.    Carpal tunnel syndrome    DDD (degenerative disc disease)    Diabetes mellitus    type 2   Diabetic retinopathy (HCC)    Hyperlipidemia    Hypertension    states very mild   Low testosterone     Myocardial infarction (HCC)    Overweight    Seasonal allergies    Tachycardia     Past Surgical History:  Procedure Laterality Date   CARDIAC CATHETERIZATION     CARPAL TUNNEL RELEASE  07/02/2011   Procedure: CARPAL TUNNEL RELEASE;  Surgeon: Lamar LULLA Leonor Mickey., MD;  Location: [facility];  Service: Orthopedics;  Laterality: Right;   CARPAL TUNNEL RELEASE  07/30/2011   Procedure: CARPAL TUNNEL RELEASE;  Surgeon: Lamar LULLA Leonor Mickey., MD;  Location: [facility];  Service: Orthopedics;  Laterality: Left;   CORONARY ANGIOPLASTY     CORONARY STENT INTERVENTION N/A 12/23/2018   Procedure: CORONARY STENT INTERVENTION;  Surgeon: Dann Candyce RAMAN, MD;  Location: [facility] INVASIVE CV LAB;  Service: Cardiovascular;  Laterality: N/A;   LEFT HEART CATH AND CORONARY ANGIOGRAPHY N/A 12/23/2018   Procedure: LEFT HEART CATH  AND CORONARY ANGIOGRAPHY;  Surgeon: Dann Candyce RAMAN, MD;  Location: [facility] INVASIVE CV LAB;  Service: Cardiovascular;  Laterality: N/A;   LUMBAR EPIDURAL INJECTION     x2   SHOULDER ARTHROSCOPY WITH OPEN ROTATOR CUFF REPAIR AND DISTAL CLAVICLE ACROMINECTOMY Right 06/03/2020   Procedure: right shoulder arthroscopy, biceps tenodesis, mini open rotator cuff tear repair subscap/supraspinatus, lateral debridement;  Surgeon: Addie Cordella Hamilton, MD;  Location: [facility] OR;  Service: Orthopedics;  Laterality: Right;    Prior to Admission medications   Medication Sig Start Date End Date Taking? Authorizing Provider  Accu-Chek Softclix Lancets lancets Use as instructed. Once daily testing. 07/26/23  Yes Rosendo Rush, MD  acetaminophen  (TYLENOL ) 325 MG tablet Take 2 tablets (650 mg total) by mouth every 6 (six) hours as needed. Patient taking differently: Take 650 mg by mouth in the morning and at bedtime. 11/05/20  Yes Austin Ade, MD  aspirin  EC 81 MG EC tablet Take 1 tablet (81 mg total) by mouth daily. 12/25/18  Yes Strader, [place] M, PA-C  atorvastatin  (LIPITOR ) 80 MG tablet TAKE 1 TABLET BY MOUTH EVERY DAY 06/11/23  Yes Shitarev, Dimitry, MD  Blood Glucose Monitoring Suppl (ACCU-CHEK GUIDE) w/Device KIT Use to monitor glucose daily. 05/20/23  Yes Shitarev, Dimitry, MD  ezetimibe  (ZETIA ) 10 MG tablet TAKE 1 TABLET BY MOUTH EVERY DAY 06/11/23  Yes Shitarev, Dimitry, MD  glucose blood test  strip Use as instructed 08/25/23  Yes McDiarmid, Krystal BIRCH, MD  insulin  degludec (TRESIBA  FLEXTOUCH) 100 UNIT/ML FlexTouch Pen Inject 60 Units into the skin daily. 10/26/23  Yes McDiarmid, Krystal BIRCH, MD  Insulin  Pen Needle 32G X 4 MM MISC 1 Box by Does not apply route in the morning and at bedtime. 09/22/23  Yes McDiarmid, Krystal BIRCH, MD  isosorbide  mononitrate (IMDUR ) 30 MG 24 hr tablet Take 1 tablet (30 mg total) by mouth daily. 06/22/22  Yes McDiarmid, Todd D, MD  loratadine (CLARITIN) 10 MG tablet Take 10 mg by mouth daily as needed for  allergies.   Yes [provider]  metoprolol  succinate (TOPROL -XL) 100 MG 24 hr tablet Take 1 tablet (100 mg total) by mouth at bedtime. Take with or immediately following a meal. 04/02/23  Yes McDiarmid, Krystal BIRCH, MD  pregabalin  (LYRICA ) 100 MG capsule TAKE 1 CAPSULE BY MOUTH 3 TIMES DAILY AS NEEDED. Patient taking differently: Take 100 mg by mouth 2 (two) times daily. TAKE 1 CAPSULE BY MOUTH 3 TIMES DAILY AS NEEDED. 09/23/23  Yes Shitarev, Dimitry, MD  cetirizine  (ZYRTEC ) 10 MG tablet Take 1 tablet (10 mg total) by mouth daily. Patient not taking: Reported on 10/28/2023 10/26/23   McDiarmid, Krystal BIRCH, MD  dapagliflozin  propanediol (FARXIGA ) 10 MG TABS tablet Take 1 tablet (10 mg total) by mouth daily. 11/09/22   Shitarev, Dimitry, MD  diclofenac  Sodium (VOLTAREN ) 1 % GEL Apply 2 g topically 4 (four) times daily. Patient not taking: Reported on 10/26/2023 09/22/23   McDiarmid, Krystal BIRCH, MD  fluticasone  (FLONASE ) 50 MCG/ACT nasal spray Place 1 spray into both nostrils daily. 1 spray in each nostril every day Patient not taking: No sig reported 09/22/23   McDiarmid, Krystal BIRCH, MD  nitroGLYCERIN  (NITROSTAT ) 0.4 MG SL tablet Place 1 tablet (0.4 mg total) under the tongue every 5 (five) minutes x 3 doses as needed for chest pain. 09/22/23   McDiarmid, Krystal BIRCH, MD  polyethylene glycol (MIRALAX ) 17 g packet Take 17 g by mouth daily. 09/13/23   Parke Jandreau, Elspeth SQUIBB, MD  tirzepatide  (MOUNJARO ) 2.5 MG/0.5ML Pen Inject 2.5 mg into the skin once a week. 10/26/23   McDiarmid, Krystal BIRCH, MD    Current Outpatient Medications  Medication Sig Dispense Refill   Accu-Chek Softclix Lancets lancets Use as instructed. Once daily testing. 100 each 12   acetaminophen  (TYLENOL ) 325 MG tablet Take 2 tablets (650 mg total) by mouth every 6 (six) hours as needed. (Patient taking differently: Take 650 mg by mouth in the morning and at bedtime.)     aspirin  EC 81 MG EC tablet Take 1 tablet (81 mg total) by mouth daily.     atorvastatin  (LIPITOR )  80 MG tablet TAKE 1 TABLET BY MOUTH EVERY DAY 90 tablet 3   Blood Glucose Monitoring Suppl (ACCU-CHEK GUIDE) w/Device KIT Use to monitor glucose daily. 1 kit 0   ezetimibe  (ZETIA ) 10 MG tablet TAKE 1 TABLET BY MOUTH EVERY DAY 90 tablet 3   glucose blood test strip Use as instructed 100 each 12   insulin  degludec (TRESIBA  FLEXTOUCH) 100 UNIT/ML FlexTouch Pen Inject 60 Units into the skin daily. 15 mL 1   Insulin  Pen Needle 32G X 4 MM MISC 1 Box by Does not apply route in the morning and at bedtime. 100 each 11   isosorbide  mononitrate (IMDUR ) 30 MG 24 hr tablet Take 1 tablet (30 mg total) by mouth daily. 90 tablet 3   loratadine (CLARITIN) 10 MG tablet  Take 10 mg by mouth daily as needed for allergies.     metoprolol  succinate (TOPROL -XL) 100 MG 24 hr tablet Take 1 tablet (100 mg total) by mouth at bedtime. Take with or immediately following a meal. 90 tablet 3   pregabalin  (LYRICA ) 100 MG capsule TAKE 1 CAPSULE BY MOUTH 3 TIMES DAILY AS NEEDED. (Patient taking differently: Take 100 mg by mouth 2 (two) times daily. TAKE 1 CAPSULE BY MOUTH 3 TIMES DAILY AS NEEDED.) 90 capsule 0   cetirizine  (ZYRTEC ) 10 MG tablet Take 1 tablet (10 mg total) by mouth daily. (Patient not taking: Reported on 10/28/2023) 90 tablet 11   dapagliflozin  propanediol (FARXIGA ) 10 MG TABS tablet Take 1 tablet (10 mg total) by mouth daily. 30 tablet 3   diclofenac  Sodium (VOLTAREN ) 1 % GEL Apply 2 g topically 4 (four) times daily. (Patient not taking: Reported on 10/26/2023) 300 each 3   fluticasone  (FLONASE ) 50 MCG/ACT nasal spray Place 1 spray into both nostrils daily. 1 spray in each nostril every day (Patient not taking: No sig reported) 16 g 2   nitroGLYCERIN  (NITROSTAT ) 0.4 MG SL tablet Place 1 tablet (0.4 mg total) under the tongue every 5 (five) minutes x 3 doses as needed for chest pain. 75 each 3   polyethylene glycol (MIRALAX ) 17 g packet Take 17 g by mouth daily.     tirzepatide  (MOUNJARO ) 2.5 MG/0.5ML Pen Inject 2.5 mg  into the skin once a week.     Current Facility-Administered Medications  Medication Dose Route Frequency Provider Last Rate Last Admin   0.9 %  sodium chloride  infusion  500 mL Intravenous Once Chloeanne Poteet, Elspeth SQUIBB, MD        Allergies as of 10/28/2023 - Review Complete 10/28/2023  Allergen Reaction Noted   Codeine Swelling 06/30/2011   Bee pollen Other (See Comments) 10/26/2023   Mosquito (diagnostic) Other (See Comments) 10/26/2023    Family History  Problem Relation Age of Onset   Stroke Mother    CAD Father    CAD Maternal Grandfather    CAD Paternal Grandfather     Social History   Socioeconomic History   Marital status: Married    Spouse name: Jowell Bossi   Number of children: 2   Years of education: 16   Highest education level: Bachelor's degree (e.g., BA, AB, BS)  Occupational History   Occupation: retired   Occupation: retired  Tobacco Use   Smoking status: Never   Smokeless tobacco: Never  Vaping Use   Vaping status: Never Used  Substance and Sexual Activity   Alcohol use: No   Drug use: No   Sexual activity: Not on file  Other Topics Concern   Not on file  Social History Narrative   Not on file   Social Drivers of Health   Financial Resource Strain: High Risk (06/27/2021)   Overall Financial Resource Strain (CARDIA)    Difficulty of Paying Living Expenses: Hard  Food Insecurity: Food Insecurity Present (07/21/2021)   Hunger Vital Sign    Worried About Running Out of Food in the Last Year: Often true    Ran Out of Food in the Last Year: Often true  Transportation Needs: No Transportation Needs (06/27/2021)   PRAPARE - Administrator, Civil Service (Medical): No    Lack of Transportation (Non-Medical): No  Physical Activity: Inactive (02/14/2019)   Exercise Vital Sign    Days of Exercise per Week: 0 days    Minutes of Exercise per  Session: 0 min  Stress: No Stress Concern Present (02/14/2019)   [employer] of Occupational  Health - Occupational Stress Questionnaire    Feeling of Stress : Only a little  Social Connections: Unknown (07/01/2021)   Received from [facility]   Social Network    Social Network: Not on file  Intimate Partner Violence: Unknown (05/23/2021)   Received from [facility]   HITS    Physically Hurt: Not on file    Insult or Talk Down To: Not on file    Threaten Physical Harm: Not on file    Scream or Curse: Not on file    Review of Systems: All other review of systems negative except as mentioned in the HPI.  Physical Exam: Vital signs BP 128/78   Pulse 74   Temp 97.7 F (36.5 C) (Temporal)   Ht 5' 8 (1.727 m)   Wt 239 lb (108.4 kg)   SpO2 97%   BMI 36.34 kg/m   General:   Alert,  Well-developed, pleasant and cooperative in NAD Lungs:  Clear throughout to auscultation.   Heart:  Regular rate and rhythm Abdomen:  Soft, nontender and nondistended.   Neuro/Psych:  Alert and cooperative. Normal mood and affect. A and O x 3  Marcey Naval, MD [facility]

## 2023-10-29 ENCOUNTER — Telehealth: Payer: Self-pay

## 2023-10-29 NOTE — Progress Notes (Signed)
 Reviewed and agree with Dr Rennis plan.

## 2023-10-29 NOTE — Telephone Encounter (Signed)
 Post procedure follow up call, no answer

## 2023-11-01 LAB — SURGICAL PATHOLOGY

## 2023-11-02 ENCOUNTER — Ambulatory Visit: Payer: Self-pay | Admitting: Gastroenterology

## 2023-11-04 ENCOUNTER — Encounter: Payer: Self-pay | Admitting: Family Medicine

## 2023-11-04 DIAGNOSIS — D369 Benign neoplasm, unspecified site: Secondary | ICD-10-CM | POA: Insufficient documentation

## 2023-11-10 NOTE — Progress Notes (Signed)
 Keith Newton                                          MRN: [id number]   11/10/2023   The [facility]BCI Quality Team Specialist reviewed this patient medical record for the purposes of chart review for care gap closure. The following were reviewed: chart review for care gap closure-glycemic status assessment.    VBCI Quality Team

## 2023-11-16 ENCOUNTER — Other Ambulatory Visit: Payer: Self-pay | Admitting: Family Medicine

## 2023-11-16 ENCOUNTER — Ambulatory Visit (INDEPENDENT_AMBULATORY_CARE_PROVIDER_SITE_OTHER): Admitting: Pharmacist

## 2023-11-16 ENCOUNTER — Encounter: Payer: Self-pay | Admitting: Pharmacist

## 2023-11-16 VITALS — BP 99/64 | HR 80 | Wt 242.0 lb

## 2023-11-16 DIAGNOSIS — E118 Type 2 diabetes mellitus with unspecified complications: Secondary | ICD-10-CM

## 2023-11-16 DIAGNOSIS — J302 Other seasonal allergic rhinitis: Secondary | ICD-10-CM

## 2023-11-16 DIAGNOSIS — E1142 Type 2 diabetes mellitus with diabetic polyneuropathy: Secondary | ICD-10-CM

## 2023-11-16 MED ORDER — POLYETHYLENE GLYCOL 3350 17 G PO PACK: 17.0000 g | PACK | Freq: Every day | ORAL | Status: AC

## 2023-11-16 MED ORDER — GLUCOSE BLOOD VI STRP: ORAL_STRIP | 12 refills | Status: AC

## 2023-11-16 MED ORDER — INSULIN PEN NEEDLE 32G X 4 MM MISC: 1.0000 | Freq: Every day | 11 refills | Status: AC

## 2023-11-16 MED ORDER — ACCU-CHEK SOFTCLIX LANCETS MISC: 12 refills | Status: AC

## 2023-11-16 MED ORDER — CETIRIZINE HCL 10 MG PO TABS: 10.0000 mg | ORAL_TABLET | Freq: Every day | ORAL | 11 refills | Status: AC

## 2023-11-16 MED ORDER — FLUTICASONE PROPIONATE 50 MCG/ACT NA SUSP: 1.0000 | Freq: Every day | NASAL | 2 refills | Status: DC

## 2023-11-16 MED ORDER — PREGABALIN 100 MG PO CAPS: ORAL_CAPSULE | ORAL | 0 refills | Status: DC

## 2023-11-16 NOTE — Progress Notes (Signed)
 S:     Chief Complaint  Patient presents with   Medication Management    Diabetes    68 y.o. male who presents for diabetes evaluation, education, and management. Patient arrives in fair spirits and presents without any assistance. Daughter Gustav was on phone during most of the visit. Patient reports successful colonoscopy and understood that a pre-cancerous polyp was removed and he is due to his next colonoscopy in 7 years.    Patient was referred and last seen by Primary Care Provider, Dr. Rosendo, on 10/26/23.  At last visit with me on 10/26/23, where he was encouraged better adherence to oral meds.   PMH is significant for Diabetes, hypertension, hyperlipidemia .    Current diabetes medications include: Tresiba  (insulin  degludec) 50 units daily, Mounjaro  (tirzepatide ) 2.5 mg weekly on Saturday, Farxiga  (dapagliflozin )  Current hypertension medications include: Metoprolol  100 mg daily Current hyperlipidemia medications include: Atorvastatin  80 mg daily and ezetimibe  10 mg daily   Patient reports adherence with all medications with exception of allergy treatments - requests refills of flonase  and cetirizine .     Do you feel that your medications are working for you? yes Have you been experiencing any side effects to the medications prescribed? no   Patient denies hypoglycemic events.   Reported home fasting blood sugars: NOT currently testing   Patient denies nocturia (nighttime urination) with improved adherence with medication.   Patient reports neuropathy (nerve pain) improved with use of Pregabalin  100mg  TID in combination with acetaminophen .    Patient reported dietary habits: doing better with avoiding juice and sugary foods.  Patient-reported Arthritis in left thumb, catching when trying to bend  Patient-reported exercise habits: limited but does report going to multiple high-school football games [employer] bre[employer] fan).  Has NOT attended [employer] football this  year.  O:   Review of Systems  HENT:  Positive for congestion.   Musculoskeletal:  Positive for joint pain.  Neurological:  Positive for dizziness (1-2 times per week).  All other systems reviewed and are negative.   Physical Exam Vitals reviewed.  Constitutional:      Appearance: Normal appearance.  Pulmonary:     Effort: Pulmonary effort is normal.  Neurological:     Mental Status: He is alert.  Psychiatric:        Mood and Affect: Mood normal.        Behavior: Behavior normal.        Thought Content: Thought content normal.        Judgment: Judgment normal.     7 day average blood glucose: NOT testing - willing to restart - requested testing supplies.    Lab Results  Component Value Date   HGBA1C 10.3 (A) 10/26/2023   Vitals:   11/16/23 0957  BP: 99/64  Pulse: 80  SpO2: 96%    Lipid Panel     Component Value Date/Time   CHOL 256 (H) 10/26/2023 1154   TRIG 187 (H) 10/26/2023 1154   HDL 38 (L) 10/26/2023 1154   CHOLHDL 6.7 (H) 10/26/2023 1154   CHOLHDL 6.6 12/24/2018 0214   VLDL 25 12/24/2018 0214   LDLCALC 183 (H) 10/26/2023 1154   LDLDIRECT 58 02/21/2021 1007    Clinical Atherosclerotic Cardiovascular Disease (ASCVD): Yes  A/P: Diabetes longstanding currently uncontrolled but likely improved control given lack of nocturia. Patient is able to verbalize appropriate hypoglycemia management plan. Medication adherence appears poor - congratulated on improved adherence. Control is suboptimal due to sedentary lifestyle and dietary  indiscretion. - Continued Tresiba  (insulin  degludec) at 60 units daily  - Continued GLP-1 Mounjaro  (tirzepatide ) 2.5 mg weekly on Fridays - has 3 injections remaining.   - Continued SGLT2-I Farxiga  Reordered glucose testing supplies, test strips and lancets.   Allergic rhinitis - refilled both cetirizine  and fluticasone  nasal spray.   Arthritis pain - notes pain better controlled with 100mg  TID regimen.  Requested refill of this  dose (not 75mg  as previously prescribed).  - Requested PCP to order.    Written patient instructions provided. Patient verbalized understanding of treatment plan.  Total time in face to face counseling 37 minutes.    Follow-up:  Pharmacist 3 weeks PCP clinic visit in ~ 2  months for A1C recheck Patient seen with Estefana Blase, PharmD Candidate - PY4 student.

## 2023-11-16 NOTE — Assessment & Plan Note (Signed)
 Diabetes longstanding currently uncontrolled but likely improved control given lack of nocturia. Patient is able to verbalize appropriate hypoglycemia management plan. Medication adherence appears poor - congratulated on improved adherence. Control is suboptimal due to sedentary lifestyle and dietary indiscretion. - Continued Tresiba  (insulin  degludec) at 60 units daily  - Continued GLP-1 Mounjaro  (tirzepatide ) 2.5 mg weekly on Fridays - has 3 injections remaining.   - Continued SGLT2-I Farxiga  Reordered glucose testing supplies, test strips and lancets.

## 2023-11-16 NOTE — Progress Notes (Signed)
 Received message from Dr. Koval stating that patient requests refill of pregabalin  100 mg TID.  On PDMP review, it looks like I filled pregabalin  100 mg TID on 8/7 x90 pills, which patient picked up.  However, 75 mg TID was filled on 9/5 from a prescription placed on 6/9 with 2 refills.  Refilling pregabalin  100 mg TID today at [facility] on [address] with 1 refill for now.  Called this pharmacy and ensured that no more 75 mg refills are available to avoid confusion.

## 2023-11-16 NOTE — Patient Instructions (Signed)
 It was nice to see you today!  Your goal blood sugar is 80-130 before eating and less than 180 after eating.  Medication Changes: Please start checking glucose 1-2 times per day  Continue all other medication the same.   Monitor blood sugars at home and keep a log (glucometer or piece of paper) to bring with you to your next visit.  Keep up the good work with diet and exercise. Aim for a diet full of vegetables, fruit and lean meats (chicken, [place], fish). Try to limit salt intake by eating fresh or frozen vegetables (instead of canned), rinse canned vegetables prior to cooking and do not add any additional salt to meals.

## 2023-11-17 NOTE — Progress Notes (Signed)
 Reviewed and agree with Dr Rennis plan.

## 2023-12-07 ENCOUNTER — Ambulatory Visit: Admitting: Pharmacist

## 2023-12-07 ENCOUNTER — Encounter: Payer: Self-pay | Admitting: Pharmacist

## 2023-12-07 VITALS — BP 109/73 | HR 90 | Wt 236.6 lb

## 2023-12-07 DIAGNOSIS — E118 Type 2 diabetes mellitus with unspecified complications: Secondary | ICD-10-CM | POA: Diagnosis not present

## 2023-12-07 DIAGNOSIS — I25110 Atherosclerotic heart disease of native coronary artery with unstable angina pectoris: Secondary | ICD-10-CM

## 2023-12-07 MED ORDER — MOUNJARO 5 MG/0.5ML ~~LOC~~ SOAJ: 5.0000 mg | SUBCUTANEOUS | 3 refills | Status: DC

## 2023-12-07 MED ORDER — METOPROLOL SUCCINATE ER 100 MG PO TB24: 100.0000 mg | ORAL_TABLET | Freq: Every day | ORAL | 3 refills | Status: AC

## 2023-12-07 NOTE — Patient Instructions (Addendum)
 It was nice to see you today!  Your goal blood sugar is 80-130 before eating and less than 180 after eating.  Medication Changes: Increase Dose on Mounjaro  (tirzepatide ) from 2.5 mg to 5 mg once weekly  Continue all other medications as prescribed. Take Metoprolol  SR once daily in the morning instead of in the evening.  Monitor blood sugars at home and keep a log (glucometer or piece of paper) to bring with you to your next visit.  Keep up the good work with diet and exercise. Aim for a diet full of vegetables, fruit and lean meats (chicken, [place], fish). Try to limit salt intake by eating fresh or frozen vegetables (instead of canned), rinse canned vegetables prior to cooking and do not add any additional salt to meals.

## 2023-12-07 NOTE — Progress Notes (Signed)
 Reviewed and agree with Dr Rennis plan.

## 2023-12-07 NOTE — Assessment & Plan Note (Addendum)
 Diabetes longstanding  currently uncontrolled. Patient is  able to verbalize appropriate hypoglycemia management plan. Medication adherence appears low. Control is suboptimal due to non-adherence to diet and medication regimen. -Continued basal insulin  Tresiba  (insulin  degludec) 60 units daily in the morning (patient advised to take within 4 hours of planned admin time if he remember - OK to take a lunchtime) -Increased dose of GLP-1  Mounjaro  (tirzepatide ) 2.5 mg to 5 mg once weekly -Continued SGLT2-I  Farxiga  (dapagliflozin ) 10 mg. Counseled on sick day rules. -Extensively discussed pathophysiology of diabetes, recommended lifestyle interventions, dietary effects on blood sugar control.

## 2023-12-07 NOTE — Progress Notes (Signed)
 S:     Chief Complaint  Patient presents with   Medication Management    DM   68 y.o. male who presents for diabetes evaluation, education, and management. Patient arrives in good spirits and presents without any assistance. Patient is accompanied by daughter, Cristie, via telephone.   Patient was referred and last seen by Primary Care Provider, Dr. amalia, on 11/16/23.  At last visit, medications were continued and patient was encouraged to increase adherence to medications and diet.   PMH is significant for DM, hyperlipidemia, hypertension, hx of NSTEMI.   Current diabetes medications include: Farxiga  (dapagliflozin ) 10mg , Tresiba  60 units once daily, Mounjaro  (tirzepatide ) 2.5 mg Current hypertension medications include: none Current hyperlipidemia medications include: atorvastatin  80 mg  Patient denies adherence with medications, reports missing his injections and his metoprolol  SR medications most days per week, on average. Daughter reports poor dietary practices.  Have you been experiencing any side effects to the medications prescribed? no  Patient denies hypoglycemic events.  Patient reports around 2 episodes of nocturia per night, down from 3-4 prior. Patient reports peripheral neuropathy.  Patient reported dietary habits: Breakfast: cereal Lunch: fast food cheeseburgers or fried chicken and fries; subway sandwiches  Dinner: steak, fried chicken wings, baked chicken plus cheese and vegetables.  Patient-reported exercise habits: none, does some yard/car work  O:   Review of Systems  Musculoskeletal:  Positive for joint pain (hand pain).  All other systems reviewed and are negative.   Physical Exam Constitutional:      Appearance: Normal appearance.  Neurological:     Mental Status: He is alert.  Psychiatric:        Mood and Affect: Mood normal.        Behavior: Behavior normal.        Thought Content: Thought content normal.        Judgment: Judgment  normal.     Lab Results  Component Value Date   HGBA1C 10.3 (A) 10/26/2023   Vitals:   12/07/23 1019  BP: 109/73  Pulse: 90  SpO2: 98%    Lipid Panel     Component Value Date/Time   CHOL 256 (H) 10/26/2023 1154   TRIG 187 (H) 10/26/2023 1154   HDL 38 (L) 10/26/2023 1154   CHOLHDL 6.7 (H) 10/26/2023 1154   CHOLHDL 6.6 12/24/2018 0214   VLDL 25 12/24/2018 0214   LDLCALC 183 (H) 10/26/2023 1154   LDLDIRECT 58 02/21/2021 1007    Clinical Atherosclerotic Cardiovascular Disease (ASCVD): Yes   A/P: Diabetes longstanding  currently uncontrolled. Patient is  able to verbalize appropriate hypoglycemia management plan. Medication adherence appears low. Control is suboptimal due to non-adherence to diet and medication regimen. -Continued basal insulin  Tresiba  (insulin  degludec) 60 units daily in the morning (patient advised to take within 4 hours of planned admin time if he remember - OK to take a lunchtime) -Increased dose of GLP-1  Mounjaro  (tirzepatide ) 2.5 mg to 5 mg once weekly -Continued SGLT2-I  Farxiga  (dapagliflozin ) 10 mg. Counseled on sick day rules. -Extensively discussed pathophysiology of diabetes, recommended lifestyle interventions, dietary effects on blood sugar control.  -Counseled on s/sx of and management of hypoglycemia.  -Next A1c anticipated December 2025.   ASCVD risk - secondary prevention in patient with diabetes. Last LDL is 183 not at goal of <44 mg/dL. ASCVD risk factors include prior NSTEMI, DM, hypertension, HLD. High intensity statin indicated.  -Continued atorvastatin  80 mg.   Hypertension longstanding currently controlled. Blood pressure goal of <130/80  mmHg.  - Instructed pt to take metoprolol  SR in the morning instead of the evening to improve adherence.  Written patient instructions provided. Patient verbalized understanding of treatment plan.  Total time in face to face counseling 52 minutes.    Follow-up:  Pharmacist 01/03/24 @ 1030 PCP  clinic visit before the end of the year Patient seen with Lawson Mao, PharmD Candidate - PY3 student and Belvie Macintosh, PharmD - PY4 Candidate.

## 2023-12-13 NOTE — Progress Notes (Signed)
 Keith Newton                                          MRN: [id number]   12/13/2023   The VBCI Quality Team Specialist reviewed this patient medical record for the purposes of chart review for care gap closure. The following were reviewed: chart review for care gap closure-kidney health evaluation for diabetes:eGFR  and uACR.    VBCI Quality Team

## 2023-12-27 ENCOUNTER — Ambulatory Visit

## 2023-12-28 ENCOUNTER — Ambulatory Visit
Admission: RE | Admit: 2023-12-28 | Discharge: 2023-12-28 | Disposition: A | Discharge status: Home / Self Care | Source: Ambulatory Visit | Attending: Family Medicine | Admitting: Family Medicine

## 2023-12-28 ENCOUNTER — Encounter: Payer: Self-pay | Admitting: Student

## 2023-12-28 ENCOUNTER — Ambulatory Visit (INDEPENDENT_AMBULATORY_CARE_PROVIDER_SITE_OTHER): Admitting: Student

## 2023-12-28 VITALS — BP 140/87 | HR 77 | Ht 68.0 in | Wt 240.4 lb

## 2023-12-28 DIAGNOSIS — W19XXXA Unspecified fall, initial encounter: Secondary | ICD-10-CM | POA: Diagnosis not present

## 2023-12-28 DIAGNOSIS — R079 Chest pain, unspecified: Secondary | ICD-10-CM

## 2023-12-28 MED ORDER — NAPROXEN 500 MG PO TABS
500.0000 mg | ORAL_TABLET | Freq: Two times a day (BID) | ORAL | 0 refills | Status: AC
Start: 2023-12-28 — End: ?

## 2023-12-28 NOTE — Progress Notes (Signed)
    SUBJECTIVE:   CHIEF COMPLAINT / HPI:   Keith Newton is a 68 year old male who presents with persistent chest and side pain following a fall.  Four days ago, he fell from his porch onto the sidewalk while carrying a blower, misjudging the number of steps. He did not lose consciousness or hit his head. After the fall, he continued yard work for about two hours.  He experiences persistent pain across his chest and side, worsening from an initial 5-6 out of 10 to an 8 out of 10 by the next day. The pain is aggravated by movements such as getting up, sitting down, lying down, and coughing. He has no difficulty breathing. He manages the pain with Tylenol , taking 500 mg every three to four hours, and applies green alcohol to his chest. Pain relief is temporary.  His daughter observed swelling in the painful area, and he notes bruising on his chest and side where he landed. No rash is present.  PERTINENT  PMH / PSH: Reviewed  OBJECTIVE:   BP (!) 140/87   Pulse 77   Ht 5' 8 (1.727 m)   Wt 240 lb 6 oz (109 kg)   SpO2 97%   BMI 36.55 kg/m    Physical Exam General: Alert, well appearing, NAD Cardiovascular: RRR, No Murmurs, Normal S2/S2 Respiratory: CTAB, No wheezing or Rales MSK: 2-4cm well circumscribed bruising over the left breast. Moderate tenderness over the left chest area. No notable swelling or deformity.  ASSESSMENT/PLAN:   Chest wall pain after fall Pain likely due to rib injury. Bruising noted, no respiratory distress. - Ordered chest X-ray for rib fracture evaluation. - Prescribed naproxen 500 mg BID. - Advised alternating naproxen with Tylenol  every 12 hours (No more than 1000mg ). - Recommended OTC lidocaine  patch for localized pain.  Norleen April, MD [facility]

## 2023-12-28 NOTE — Patient Instructions (Signed)
 Pleasure to see you today.  I have ordered a chest x-ray today to assess for possible rib fracture given your fall.  Please you can go to [facility] imaging on [address] [place] to have these x-ray completed.  I have also sent in prescription for naproxen which you can take 2 times daily for the next 10-14 days for better pain control.  You can continue to do Tylenol  in between each dose of naproxen but no more than 1000 mg.  Also you can get over-the-counter lidocaine  patch which you can apply over the tender area on your chest.

## 2024-01-03 ENCOUNTER — Ambulatory Visit (INDEPENDENT_AMBULATORY_CARE_PROVIDER_SITE_OTHER): Admitting: Pharmacist

## 2024-01-03 ENCOUNTER — Other Ambulatory Visit: Payer: Self-pay | Admitting: Family Medicine

## 2024-01-03 ENCOUNTER — Encounter: Payer: Self-pay | Admitting: Pharmacist

## 2024-01-03 VITALS — Wt 238.6 lb

## 2024-01-03 DIAGNOSIS — E118 Type 2 diabetes mellitus with unspecified complications: Secondary | ICD-10-CM | POA: Diagnosis not present

## 2024-01-03 DIAGNOSIS — E785 Hyperlipidemia, unspecified: Secondary | ICD-10-CM | POA: Diagnosis not present

## 2024-01-03 DIAGNOSIS — E1169 Type 2 diabetes mellitus with other specified complication: Secondary | ICD-10-CM

## 2024-01-03 DIAGNOSIS — E1159 Type 2 diabetes mellitus with other circulatory complications: Secondary | ICD-10-CM

## 2024-01-03 DIAGNOSIS — E1142 Type 2 diabetes mellitus with diabetic polyneuropathy: Secondary | ICD-10-CM

## 2024-01-03 LAB — POCT GLYCOSYLATED HEMOGLOBIN (HGB A1C): HbA1c, POC (controlled diabetic range): 9.3 % — AB (ref 0.0–7.0)

## 2024-01-03 MED ORDER — TIRZEPATIDE 7.5 MG/0.5ML ~~LOC~~ SOAJ: 7.5000 mg | SUBCUTANEOUS | 3 refills | Status: AC

## 2024-01-03 MED ORDER — PREGABALIN 100 MG PO CAPS: ORAL_CAPSULE | ORAL | 2 refills | Status: AC

## 2024-01-03 NOTE — Patient Instructions (Signed)
 It was nice to see you today!  Your goal blood sugar is 80-130 before eating and less than 180 after eating.  Medication Changes: RESTART insuline degludec (Tresiba ) 60 units in skin daily  Increase Mounjaro  (tirzepatide ) to 7.5 mg once weekly   Continue all other medication the same.   Monitor blood sugars at home and keep a log (glucometer or piece of paper) to bring with you to your next visit.  Keep up the good work with diet and exercise. Aim for a diet full of vegetables, fruit and lean meats (chicken, turkey, fish). Try to limit salt intake by eating fresh or frozen vegetables (instead of canned), rinse canned vegetables prior to cooking and do not add any additional salt to meals.

## 2024-01-03 NOTE — Assessment & Plan Note (Deleted)
 Hypertension longstanding currently controlled. Blood pressure goal of <130/80 mmHg.  -Continued metoprolol  100 mg daily

## 2024-01-03 NOTE — Progress Notes (Signed)
 Per note from Dr. Koval at [facility], patient requests refill of 100 mg TID pregabalin  dose.  Do note that patient got 90 days of 75 mg dose on 9/5 per PDMP, but now on this increased dose.  Previously, I had called pharmacy to ensure they had the correct dose on file, but this was after 9/5.  Patient should now only get 100 mg TID. - Pregabalin  100 mg TID x30 days with 2 refills sent

## 2024-01-03 NOTE — Progress Notes (Signed)
    S:     Chief Complaint  Patient presents with   Medication Management    T2DM Follow up   68 y.o. male who presents for diabetes evaluation, education, and management. Patient arrives in fair spirits and presents without any assistance. Reported recent fall and that he is currently in pain, which is why he has not been taking any injection medications. Currently taking tylenol  and naproxen for pain control. Daughter, Curtistine, joins visit via phone call.  She reports adherence with medication, pill boxes.    Patient was referred and last seen by Primary Care Provider, Dr. Marston, on 12/28/23.   PMH is significant for DM, hyperlipidemia, hypertension, hx of NSTEMI.   Current diabetes medications include: Farxiga  (dapagliflozin ) 10mg , Denies taking - Tresiba  (insulin  degludec) 60 units once daily, Mounjaro  (tirzepatide ) 5 mg weekly Current hyperlipidemia medications include: atorvastatin  80 mg, ezetimibe  10 mg  Patient denies adherence with medications, reports missing all injection medications (Tresiba  and Mounjaro  (tirzepatide ). Reports last taken 1 month ago.  Patient denies hypoglycemic events.  Patient reports nocturia (nighttime urination). ~ 2 times per night. Patient reports neuropathy (nerve pain). Patient reports visual changes. Patient reports self foot exams. Got some cuts on foot when he walked with no shoes on and he trimmed it with the nail clipper.   Patient reported dietary habits: Eats 1 meals/day, around 7:30PM - 8PM. Has been drinking soda.   O:  Review of Systems  All other systems reviewed and are negative.  Physical Exam Pulmonary:     Effort: Pulmonary effort is normal.  Neurological:     Mental Status: He is alert.  Psychiatric:        Behavior: Behavior normal.        Thought Content: Thought content normal.    Lab Results  Component Value Date   HGBA1C 9.3 (A) 01/03/2024    Lipid Panel     Component Value Date/Time   CHOL 256 (H)  10/26/2023 1154   TRIG 187 (H) 10/26/2023 1154   HDL 38 (L) 10/26/2023 1154   CHOLHDL 6.7 (H) 10/26/2023 1154   CHOLHDL 6.6 12/24/2018 0214   VLDL 25 12/24/2018 0214   LDLCALC 183 (H) 10/26/2023 1154   LDLDIRECT 58 02/21/2021 1007   A/P: Diabetes longstanding currently uncontrolled. Medication adherence appears poor. Control is suboptimal due to nonadherence. A1C today 9.3 (improved from 10.3 in September, 2025.)  -Continued basal insulin  Tresiba  (degludec) 60 units daily -Increased dose of GLP-1 Mounjaro  (tirzepatide ) from 5 mg to 7.5 mg. Patient agreed to pick up medication and will take last remaining 5mg  dose today AND Increase Mounjaro  (tirzepatide ) dose to 7.5mg  weekly starting next Monday.   -Continued SGLT2-I Farxiga  (dapagliflozin ) 10mg  daily -Counseled patient extensively on the importance of adherence to his antidiabetic regimen.  -Extensively discussed pathophysiology of diabetes, recommended lifestyle interventions, dietary effects on blood sugar control.  -Counseled on s/sx of and management of hypoglycemia.  - Labs sent for A1C, BMET, direct LDL and UACR  Secondary prevention in patient with diabetes. Last LDL is 183 not at goal of <29 mg/dL.ASCVD risk factors include prior NSTEMI, DM, hypertension, HLD. High intensity statin indicated.  -Continued atorvastatin  80 mg and ezetimibe  10 mg daily.  - LDL direct today   Written patient instructions provided. Patient verbalized understanding of treatment plan.  Total time in face to face counseling 35 minutes.    Follow-up:  Pharmacist 01/31/24 Patient seen with Recardo Purdue PharmD - PY4 Candidate.

## 2024-01-03 NOTE — Progress Notes (Signed)
 Reviewed and agree with Dr Rennis plan.

## 2024-01-03 NOTE — Assessment & Plan Note (Signed)
 Secondary prevention in patient with diabetes. Last LDL is 183 not at goal of <29 mg/dL.ASCVD risk factors include prior NSTEMI, DM, hypertension, HLD. High intensity statin indicated.  -Continued atorvastatin  80 mg.

## 2024-01-03 NOTE — Assessment & Plan Note (Addendum)
 Diabetes longstanding currently uncontrolled. Medication adherence appears poor. Control is suboptimal due to nonadherence. A1C today 9.3 (improved from 10.3 in September, 2025.)  -Continued basal insulin  Tresiba  (degludec) 60 units daily -Increased dose of GLP-1 Mounjaro  (tirzepatide ) from 5 mg to 7.5 mg. Patient agreed to pick up medication and will take last remaining 5mg  dose today AND Increase Mounjaro  (tirzepatide ) dose to 7.5mg  weekly starting next Monday.   -Continued SGLT2-I Farxiga  (dapagliflozin ) 10mg  daily -Counseled patient extensively on the importance of adherence to his antidiabetic regimen.  -Extensively discussed pathophysiology of diabetes, recommended lifestyle interventions, dietary effects on blood sugar control.  -Counseled on s/sx of and management of hypoglycemia.  - Labs sent for A1C, BMET, direct LDL and UACR

## 2024-01-04 ENCOUNTER — Ambulatory Visit: Payer: Self-pay | Admitting: Pharmacist

## 2024-01-04 LAB — BASIC METABOLIC PANEL WITH GFR
BUN/Creatinine Ratio: 19 (ref 10–24)
BUN: 19 mg/dL (ref 8–27)
CO2: 20 mmol/L (ref 20–29)
Calcium: 9.8 mg/dL (ref 8.6–10.2)
Chloride: 103 mmol/L (ref 96–106)
Creatinine, Ser: 1.01 mg/dL (ref 0.76–1.27)
Glucose: 178 mg/dL — ABNORMAL HIGH (ref 70–99)
Potassium: 4.6 mmol/L (ref 3.5–5.2)
Sodium: 141 mmol/L (ref 134–144)
eGFR: 82 mL/min/1.73 (ref >59)

## 2024-01-04 LAB — LDL CHOLESTEROL, DIRECT: LDL Direct: 71 mg/dL (ref 0–99)

## 2024-01-04 NOTE — Telephone Encounter (Signed)
 At patient request, contacted daughter with lab results.   Attempted to contact patient for follow-up of BMET, LDL, and A1C   Left HIPAA compliant voice mail sharing that lab results were stable and at goal with exception of labs for glucose control.  Offered call back to direct phone: [phone] with questions.   Total time with patient call and documentation of interaction: 7 minutes.

## 2024-01-04 NOTE — Telephone Encounter (Signed)
-----   Message from [facility] McDiarmid sent at 01/03/2024  5:12 PM EST -----  ----- Message ----- From: Inocente Lamar CROME, CMA Sent: 01/03/2024  10:45 AM EST To: Krystal BIRCH McDiarmid, MD

## 2024-01-06 NOTE — Progress Notes (Signed)
 Reviewed and agree with Dr Rennis plan.

## 2024-01-07 ENCOUNTER — Encounter: Payer: Self-pay | Admitting: Pharmacist

## 2024-01-07 NOTE — Progress Notes (Signed)
 This patient is appearing on a report for being at risk of failing the adherence measure for cholesterol (statin) medications this calendar year.   Medication: Atorvastatin  80 mg Last fill date: 11/3 for 90 day supply

## 2024-01-08 ENCOUNTER — Encounter: Payer: Self-pay | Admitting: Family Medicine

## 2024-01-31 ENCOUNTER — Encounter: Payer: Self-pay | Admitting: Pharmacist

## 2024-01-31 ENCOUNTER — Ambulatory Visit: Admitting: Pharmacist

## 2024-01-31 VITALS — BP 133/77 | HR 72 | Wt 237.0 lb

## 2024-01-31 DIAGNOSIS — E1169 Type 2 diabetes mellitus with other specified complication: Secondary | ICD-10-CM

## 2024-01-31 DIAGNOSIS — E118 Type 2 diabetes mellitus with unspecified complications: Secondary | ICD-10-CM

## 2024-01-31 NOTE — Assessment & Plan Note (Signed)
 Secondary prevention in patient with diabetes. Recent direct LDL is 71 and very near goal of <70 mg/dL.  ASCVD risk factors include prior NSTEMI, DM, hypertension, HLD. High intensity statin indicated.  -Continued atorvastatin  80 mg and ezetimibe  10 mg daily.

## 2024-01-31 NOTE — Assessment & Plan Note (Signed)
 Diabetes longstanding and currently appears to have slight improvement per home readings provided by patient likely the result of improved medication adherence.  Last A1C 9.3 is improved from 10.3 in September, 2025. -Continued basal insulin  Tresiba  (degludec) 60 units daily -Increased dose of GLP-1 Mounjaro  (tirzepatide ) from 5 mg to 7.5 mg. Patient agreed increase Mounjaro  (tirzepatide ) dose to 7.5mg  weekly - plans to pick-up previously ordered Mounjaro  (tirzepatide ) today.  -Continued SGLT2-I Farxiga  (dapagliflozin ) 10mg  daily -RE- Counseled patient extensively on the importance of adherence to his antidiabetic regimen.

## 2024-01-31 NOTE — Patient Instructions (Signed)
 It was nice to see you today!  You are doing great with taking your medications and staying active.   Your goal blood sugar is 80-130 before eating and less than 180 after eating.  Medication Changes: Continue all other medication the same.   Monitor blood sugars at home - at least daily.   Keep up the good work with diet and exercise. Aim for a diet full of vegetables, fruit and lean meats (chicken, turkey, fish). Try to limit salt intake by eating fresh or frozen vegetables (instead of canned), rinse canned vegetables prior to cooking and do not add any additional salt to meals.

## 2024-01-31 NOTE — Progress Notes (Signed)
 S:     Chief Complaint  Patient presents with   Medication Management    Diabetes - Medication adherence    68 y.o. male who presents for diabetes evaluation, education, and management. Patient arrives in fairly good spirits and presents without any assistance. Reports taking all oral and injection medications. Daughter, Curtistine, joins visit via phone call.  She reports adherence with medication, pill boxes.     Patient was referred and last seen by Primary Care Provider, Dr. Marston, on 12/28/23.    PMH is significant for DM, hyperlipidemia, hypertension, hx of NSTEMI.    Current diabetes medications include: Farxiga  (dapagliflozin ) 10mg , Reports taking - Tresiba  (insulin  degludec) 60 units once daily, Reports running out of Mounjaro  (tirzepatide ) 5 mg weekly AND has not yet picked up the higher dose which was prescribed at last visit.  Current hyperlipidemia medications include: atorvastatin  80 mg, ezetimibe  10 mg   Patient denies hypoglycemic events.   Patient reports nocturia (nighttime urination). Decreased to ~ 1-2 times per night. Patient reports neuropathy (nerve pain) is controlled with pregabalin  Patient reports self foot exams.    Patient reported dietary habits: Eats 1 larger meal/day, around 7:30PM - 8PM. Admits dietary non-adherence including eating 1/2 of a sweet potato pie for the holiday.   Do you feel that your medications are working for you? yes Have you been experiencing any side effects to the medications prescribed? no  Reported home fasting blood sugars: in the mid-100s with lowest reading since last visit ~ 130   Patient-reported exercise habits: working on truck and taking care of the dogs.  He was elated about [employer] willing the state football championship. He was also frustrated with his wife who had a stroke > 6 years ago and the patient is struggling to handle all of the household tasks/maintenance with little help from her.    O:    Review of Systems  Constitutional:  Negative for malaise/fatigue.  All other systems reviewed and are negative.   Physical Exam Vitals reviewed.  Pulmonary:     Effort: Pulmonary effort is normal.  Neurological:     Mental Status: He is alert.  Psychiatric:        Mood and Affect: Mood normal.        Behavior: Behavior normal.        Thought Content: Thought content normal.        Judgment: Judgment normal.    Lab Results  Component Value Date   HGBA1C 9.3 (A) 01/03/2024   Vitals:   01/31/24 1041 01/31/24 1048  BP: (!) 147/76 133/77  Pulse: 72   SpO2: 98%    Lipid Panel     Component Value Date/Time   CHOL 256 (H) 10/26/2023 1154   TRIG 187 (H) 10/26/2023 1154   HDL 38 (L) 10/26/2023 1154   CHOLHDL 6.7 (H) 10/26/2023 1154   CHOLHDL 6.6 12/24/2018 0214   VLDL 25 12/24/2018 0214   LDLCALC 183 (H) 10/26/2023 1154   LDLDIRECT 71 01/03/2024 1102    Clinical Atherosclerotic Cardiovascular Disease (ASCVD): Yes  The ASCVD Risk score (Arnett DK, et al., 2019) failed to calculate for the following reasons:   Risk score cannot be calculated because patient has a medical history suggesting prior/existing ASCVD   * - Cholesterol units were assumed   A/P: Diabetes longstanding and currently appears to have slight improvement per home readings provided by patient likely the result of improved medication adherence.  Last A1C 9.3 is  improved from 10.3 in September, 2025. -Continued basal insulin  Tresiba  (degludec) 60 units daily -Increased dose of GLP-1 Mounjaro  (tirzepatide ) from 5 mg to 7.5 mg. Patient agreed increase Mounjaro  (tirzepatide ) dose to 7.5mg  weekly - plans to pick-up previously ordered Mounjaro  (tirzepatide ) today.  -Continued SGLT2-I Farxiga  (dapagliflozin ) 10mg  daily -RE- Counseled patient extensively on the importance of adherence to his antidiabetic regimen.  -Extensively discussed pathophysiology of diabetes, recommended lifestyle interventions, dietary  effects on blood sugar control.  -Counseled on s/sx of and management of hypoglycemia.  - Reviewed all labs from last visit. A1C, BMET, direct LDL and UACR   Secondary prevention in patient with diabetes. Recent direct LDL is 71 and very near goal of <70 mg/dL.  ASCVD risk factors include prior NSTEMI, DM, hypertension, HLD. High intensity statin indicated.  -Continued atorvastatin  80 mg and ezetimibe  10 mg daily.    Written patient instructions provided. Patient verbalized understanding of treatment plan.  Total time in face to face counseling 31 minutes.    Follow-up:  Pharmacist TBD following PCP  visit  PCP clinic visit in 04/04/2024

## 2024-02-04 NOTE — Progress Notes (Signed)
 Reviewed and agree with Dr Rennis plan.

## 2024-02-16 ENCOUNTER — Other Ambulatory Visit: Payer: Self-pay | Admitting: Family Medicine

## 2024-02-16 DIAGNOSIS — J302 Other seasonal allergic rhinitis: Secondary | ICD-10-CM

## 2024-03-08 ENCOUNTER — Ambulatory Visit: Admitting: Family Medicine

## 2024-04-04 ENCOUNTER — Ambulatory Visit: Payer: Self-pay | Admitting: Family Medicine

## 2024-04-14 IMAGING — DX DG CHEST 1V PORT
1 series · 1 of 1 positions shown · non-contrast
Comparison: chest x-ray dated December 23, 2018

CLINICAL DATA: Dizziness

EXAM:
PORTABLE CHEST 1 VIEW

[chest]
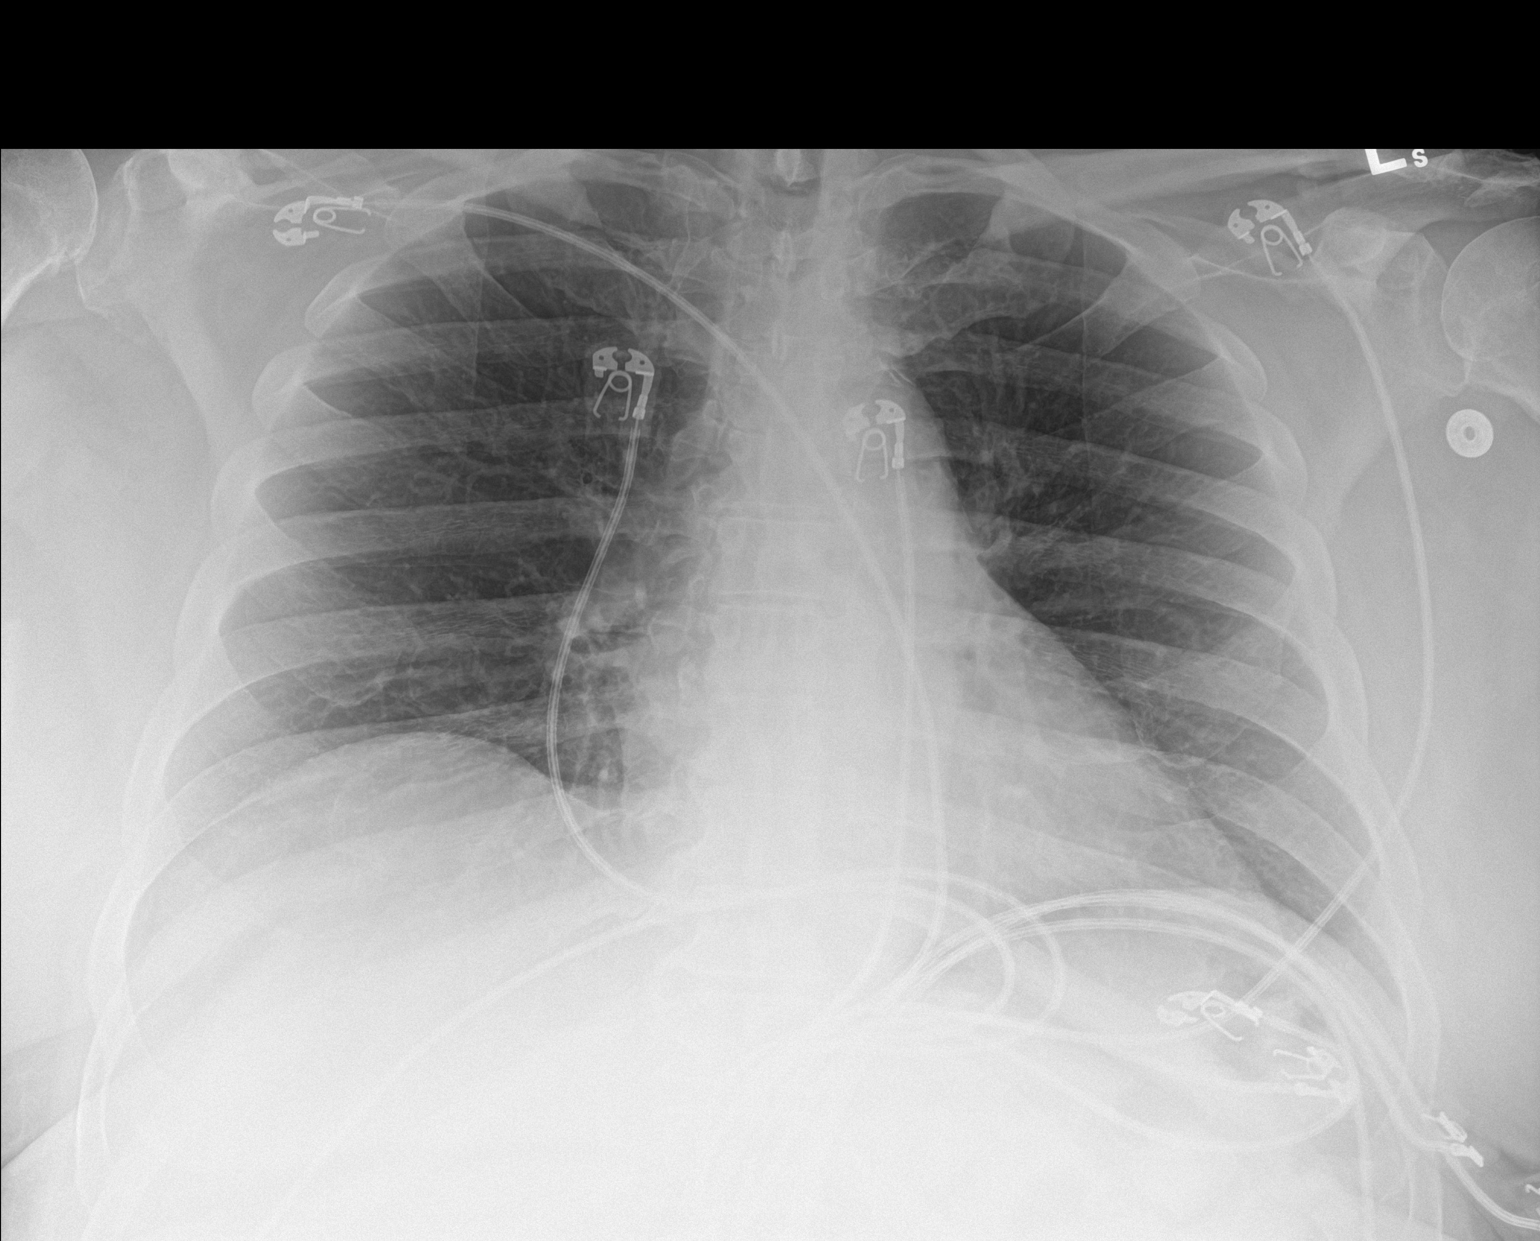

[1 of 1 positions shown; findings below may reference images not displayed]

FINDINGS: The heart size and mediastinal contours are within normal limits.
Both lungs are clear. The visualized skeletal structures are
unremarkable.
IMPRESSION: No active disease.
